# Patient Record
Sex: Female | Born: 1935 | Race: Black or African American | Hispanic: No | Marital: Single | Smoking: Never smoker
Health system: Southern US, Community
[De-identification: ages and names within clinical notes are randomized; demographics above are authoritative.]

## PROBLEM LIST (undated history)

## (undated) DIAGNOSIS — E119 Type 2 diabetes mellitus without complications: Secondary | ICD-10-CM

## (undated) DIAGNOSIS — I10 Essential (primary) hypertension: Secondary | ICD-10-CM

## (undated) DIAGNOSIS — I639 Cerebral infarction, unspecified: Secondary | ICD-10-CM

## (undated) DIAGNOSIS — D649 Anemia, unspecified: Secondary | ICD-10-CM

## (undated) DIAGNOSIS — M1990 Unspecified osteoarthritis, unspecified site: Secondary | ICD-10-CM

## (undated) DIAGNOSIS — N189 Chronic kidney disease, unspecified: Secondary | ICD-10-CM

## (undated) DIAGNOSIS — K649 Unspecified hemorrhoids: Secondary | ICD-10-CM

## (undated) DIAGNOSIS — E079 Disorder of thyroid, unspecified: Secondary | ICD-10-CM

## (undated) HISTORY — DX: Unspecified osteoarthritis, unspecified site: M19.90

## (undated) HISTORY — DX: Type 2 diabetes mellitus without complications: E11.9

## (undated) HISTORY — DX: Chronic kidney disease, unspecified: N18.9

## (undated) HISTORY — PX: BREAST LUMPECTOMY: SHX2

## (undated) HISTORY — DX: Unspecified hemorrhoids: K64.9

## (undated) HISTORY — PX: ABDOMINAL HYSTERECTOMY: SHX81

## (undated) HISTORY — DX: Disorder of thyroid, unspecified: E07.9

## (undated) HISTORY — DX: Anemia, unspecified: D64.9

## (undated) HISTORY — DX: Essential (primary) hypertension: I10

---

## 2001-07-06 ENCOUNTER — Encounter: Admission: RE | Admit: 2001-07-06 | Discharge: 2001-07-06 | Payer: Self-pay | Admitting: Endocrinology

## 2001-07-06 ENCOUNTER — Encounter: Payer: Self-pay | Admitting: Endocrinology

## 2003-05-19 ENCOUNTER — Emergency Department (HOSPITAL_COMMUNITY): Admission: EM | Admit: 2003-05-19 | Discharge: 2003-05-19 | Payer: Self-pay | Admitting: Family Medicine

## 2003-05-26 ENCOUNTER — Emergency Department (HOSPITAL_COMMUNITY): Admission: EM | Admit: 2003-05-26 | Discharge: 2003-05-26 | Payer: Self-pay | Admitting: Family Medicine

## 2005-04-03 ENCOUNTER — Encounter: Admission: RE | Admit: 2005-04-03 | Discharge: 2005-04-03 | Payer: Self-pay | Admitting: Endocrinology

## 2006-04-15 ENCOUNTER — Encounter: Admission: RE | Admit: 2006-04-15 | Discharge: 2006-04-15 | Payer: Self-pay | Admitting: Endocrinology

## 2007-04-18 ENCOUNTER — Encounter: Admission: RE | Admit: 2007-04-18 | Discharge: 2007-04-18 | Payer: Self-pay | Admitting: Endocrinology

## 2008-05-18 ENCOUNTER — Encounter: Admission: RE | Admit: 2008-05-18 | Discharge: 2008-05-18 | Payer: Self-pay | Admitting: Endocrinology

## 2008-05-19 HISTORY — PX: MASTECTOMY: SHX3

## 2008-05-30 ENCOUNTER — Encounter: Admission: RE | Admit: 2008-05-30 | Discharge: 2008-05-30 | Payer: Self-pay | Admitting: Endocrinology

## 2008-05-30 ENCOUNTER — Encounter (INDEPENDENT_AMBULATORY_CARE_PROVIDER_SITE_OTHER): Payer: Self-pay | Admitting: Endocrinology

## 2008-05-30 ENCOUNTER — Encounter (INDEPENDENT_AMBULATORY_CARE_PROVIDER_SITE_OTHER): Payer: Self-pay | Admitting: Diagnostic Radiology

## 2008-06-04 ENCOUNTER — Encounter: Admission: RE | Admit: 2008-06-04 | Discharge: 2008-06-04 | Payer: Self-pay | Admitting: General Surgery

## 2008-06-05 ENCOUNTER — Encounter: Admission: RE | Admit: 2008-06-05 | Discharge: 2008-06-05 | Payer: Self-pay | Admitting: Endocrinology

## 2008-06-06 ENCOUNTER — Encounter (INDEPENDENT_AMBULATORY_CARE_PROVIDER_SITE_OTHER): Payer: Self-pay | Admitting: Diagnostic Radiology

## 2008-06-06 ENCOUNTER — Encounter: Admission: RE | Admit: 2008-06-06 | Discharge: 2008-06-06 | Payer: Self-pay | Admitting: Endocrinology

## 2008-06-06 ENCOUNTER — Encounter: Payer: Self-pay | Admitting: General Surgery

## 2008-06-08 ENCOUNTER — Ambulatory Visit (HOSPITAL_COMMUNITY): Admission: RE | Admit: 2008-06-08 | Discharge: 2008-06-09 | Payer: Self-pay | Admitting: General Surgery

## 2008-06-08 ENCOUNTER — Encounter (INDEPENDENT_AMBULATORY_CARE_PROVIDER_SITE_OTHER): Payer: Self-pay | Admitting: General Surgery

## 2008-06-21 ENCOUNTER — Ambulatory Visit: Payer: Self-pay | Admitting: Oncology

## 2008-06-27 LAB — CBC WITH DIFFERENTIAL/PLATELET
Basophils Absolute: 0 10*3/uL (ref 0.0–0.1)
EOS%: 2.2 % (ref 0.0–7.0)
Eosinophils Absolute: 0.2 10*3/uL (ref 0.0–0.5)
HCT: 29.3 % — ABNORMAL LOW (ref 34.8–46.6)
HGB: 9.5 g/dL — ABNORMAL LOW (ref 11.6–15.9)
MCH: 24.6 pg — ABNORMAL LOW (ref 25.1–34.0)
MCV: 75.8 fL — ABNORMAL LOW (ref 79.5–101.0)
MONO%: 6.9 % (ref 0.0–14.0)
NEUT%: 77.6 % — ABNORMAL HIGH (ref 38.4–76.8)
lymph#: 1 10*3/uL (ref 0.9–3.3)

## 2008-06-27 LAB — RETICULOCYTES
IRF: 0.3 (ref 0.130–0.330)
RBC: 3.81 10*6/uL (ref 3.70–5.45)

## 2008-06-28 LAB — CANCER ANTIGEN 27.29: CA 27.29: 21 U/mL (ref 0–39)

## 2008-06-28 LAB — COMPREHENSIVE METABOLIC PANEL
AST: 11 U/L (ref 0–37)
BUN: 23 mg/dL (ref 6–23)
Calcium: 8.8 mg/dL (ref 8.4–10.5)
Chloride: 102 mEq/L (ref 96–112)
Creatinine, Ser: 1.38 mg/dL — ABNORMAL HIGH (ref 0.40–1.20)
Glucose, Bld: 195 mg/dL — ABNORMAL HIGH (ref 70–99)

## 2008-06-28 LAB — VITAMIN D 25 HYDROXY (VIT D DEFICIENCY, FRACTURES): Vit D, 25-Hydroxy: 7 ng/mL — ABNORMAL LOW (ref 30–89)

## 2008-06-30 LAB — IRON AND TIBC
Iron: 15 ug/dL — ABNORMAL LOW (ref 42–145)
TIBC: 181 ug/dL — ABNORMAL LOW (ref 250–470)
UIBC: 166 ug/dL

## 2008-06-30 LAB — TRANSFERRIN RECEPTOR, SOLUABLE: Transferrin Receptor, Soluble: 26.9 nmol/L

## 2008-07-02 ENCOUNTER — Encounter: Admission: RE | Admit: 2008-07-02 | Discharge: 2008-07-02 | Payer: Self-pay | Admitting: Oncology

## 2008-10-29 ENCOUNTER — Ambulatory Visit: Payer: Self-pay | Admitting: Oncology

## 2008-10-31 LAB — CBC WITH DIFFERENTIAL/PLATELET
Basophils Absolute: 0 10*3/uL (ref 0.0–0.1)
EOS%: 1.5 % (ref 0.0–7.0)
HGB: 10 g/dL — ABNORMAL LOW (ref 11.6–15.9)
MCH: 23.8 pg — ABNORMAL LOW (ref 25.1–34.0)
NEUT#: 4.9 10*3/uL (ref 1.5–6.5)
RDW: 14.2 % (ref 11.2–14.5)
WBC: 6.8 10*3/uL (ref 3.9–10.3)
lymph#: 1.4 10*3/uL (ref 0.9–3.3)

## 2008-11-01 LAB — COMPREHENSIVE METABOLIC PANEL
ALT: 11 U/L (ref 0–35)
AST: 13 U/L (ref 0–37)
Albumin: 3.6 g/dL (ref 3.5–5.2)
BUN: 23 mg/dL (ref 6–23)
Calcium: 9.1 mg/dL (ref 8.4–10.5)
Chloride: 101 mEq/L (ref 96–112)
Potassium: 4.4 mEq/L (ref 3.5–5.3)

## 2009-05-23 ENCOUNTER — Ambulatory Visit: Payer: Self-pay | Admitting: Oncology

## 2009-05-24 LAB — CBC & DIFF AND RETIC
BASO%: 0.3 % (ref 0.0–2.0)
Basophils Absolute: 0 10*3/uL (ref 0.0–0.1)
Eosinophils Absolute: 0.2 10*3/uL (ref 0.0–0.5)
HGB: 9.7 g/dL — ABNORMAL LOW (ref 11.6–15.9)
LYMPH%: 20.7 % (ref 14.0–49.7)
MCH: 23.5 pg — ABNORMAL LOW (ref 25.1–34.0)
NEUT#: 5.6 10*3/uL (ref 1.5–6.5)
NEUT%: 70.9 % (ref 38.4–76.8)
RBC: 4.13 10*6/uL (ref 3.70–5.45)
RDW: 14.4 % (ref 11.2–14.5)
Retic %: 1.18 % (ref 0.50–1.50)
Retic Ct Abs: 48.73 10*3/uL (ref 18.30–72.70)
lymph#: 1.6 10*3/uL (ref 0.9–3.3)

## 2009-05-27 LAB — COMPREHENSIVE METABOLIC PANEL
ALT: 10 U/L (ref 0–35)
Alkaline Phosphatase: 81 U/L (ref 39–117)
BUN: 27 mg/dL — ABNORMAL HIGH (ref 6–23)
Calcium: 9 mg/dL (ref 8.4–10.5)
Creatinine, Ser: 1.41 mg/dL — ABNORMAL HIGH (ref 0.40–1.20)
Glucose, Bld: 133 mg/dL — ABNORMAL HIGH (ref 70–99)
Sodium: 139 mEq/L (ref 135–145)
Total Protein: 6.8 g/dL (ref 6.0–8.3)

## 2009-05-27 LAB — IRON AND TIBC
%SAT: 15 % — ABNORMAL LOW (ref 20–55)
TIBC: 199 ug/dL — ABNORMAL LOW (ref 250–470)
UIBC: 169 ug/dL

## 2009-06-14 ENCOUNTER — Encounter: Admission: RE | Admit: 2009-06-14 | Discharge: 2009-06-14 | Payer: Self-pay | Admitting: Oncology

## 2009-06-24 ENCOUNTER — Ambulatory Visit: Payer: Self-pay | Admitting: Oncology

## 2009-06-26 LAB — CBC WITH DIFFERENTIAL/PLATELET
Basophils Absolute: 0 10*3/uL (ref 0.0–0.1)
HCT: 32.4 % — ABNORMAL LOW (ref 34.8–46.6)
LYMPH%: 22.2 % (ref 14.0–49.7)
MCHC: 31.5 g/dL (ref 31.5–36.0)
MONO%: 8 % (ref 0.0–14.0)
NEUT%: 67.8 % (ref 38.4–76.8)
Platelets: 249 10*3/uL (ref 145–400)
RDW: 14.5 % (ref 11.2–14.5)
lymph#: 1.9 10*3/uL (ref 0.9–3.3)

## 2009-07-01 LAB — CBC WITH DIFFERENTIAL/PLATELET
HGB: 10.3 g/dL — ABNORMAL LOW (ref 11.6–15.9)
MCHC: 32.4 g/dL (ref 31.5–36.0)
MCV: 75.8 fL — ABNORMAL LOW (ref 79.5–101.0)
MONO%: 6.3 % (ref 0.0–14.0)
NEUT#: 6.7 10*3/uL — ABNORMAL HIGH (ref 1.5–6.5)
Platelets: 275 10*3/uL (ref 145–400)
RDW: 14.9 % — ABNORMAL HIGH (ref 11.2–14.5)

## 2009-07-24 ENCOUNTER — Ambulatory Visit: Payer: Self-pay | Admitting: Oncology

## 2009-07-26 LAB — CBC WITH DIFFERENTIAL/PLATELET
EOS%: 1.5 % (ref 0.0–7.0)
Eosinophils Absolute: 0.1 10*3/uL (ref 0.0–0.5)
MCH: 23.7 pg — ABNORMAL LOW (ref 25.1–34.0)
MONO#: 0.6 10*3/uL (ref 0.1–0.9)
MONO%: 7.6 % (ref 0.0–14.0)
NEUT#: 5.3 10*3/uL (ref 1.5–6.5)
RBC: 4.23 10*6/uL (ref 3.70–5.45)
RDW: 16 % — ABNORMAL HIGH (ref 11.2–14.5)
WBC: 7.6 10*3/uL (ref 3.9–10.3)

## 2009-08-23 ENCOUNTER — Ambulatory Visit: Payer: Self-pay | Admitting: Oncology

## 2009-08-27 LAB — CBC WITH DIFFERENTIAL/PLATELET
Basophils Absolute: 0 10*3/uL (ref 0.0–0.1)
EOS%: 1.4 % (ref 0.0–7.0)
MONO#: 0.5 10*3/uL (ref 0.1–0.9)
MONO%: 6.5 % (ref 0.0–14.0)
NEUT%: 72.5 % (ref 38.4–76.8)
RBC: 4.48 10*6/uL (ref 3.70–5.45)
RDW: 17.1 % — ABNORMAL HIGH (ref 11.2–14.5)

## 2009-09-20 LAB — CBC WITH DIFFERENTIAL/PLATELET
BASO%: 0.2 % (ref 0.0–2.0)
Basophils Absolute: 0 10*3/uL (ref 0.0–0.1)
MCHC: 31.5 g/dL (ref 31.5–36.0)
MONO#: 0.9 10*3/uL (ref 0.1–0.9)
MONO%: 7 % (ref 0.0–14.0)
NEUT#: 10.9 10*3/uL — ABNORMAL HIGH (ref 1.5–6.5)
NEUT%: 81.4 % — ABNORMAL HIGH (ref 38.4–76.8)
RBC: 4.85 10*6/uL (ref 3.70–5.45)
RDW: 17.2 % — ABNORMAL HIGH (ref 11.2–14.5)
WBC: 13.4 10*3/uL — ABNORMAL HIGH (ref 3.9–10.3)

## 2009-10-16 ENCOUNTER — Ambulatory Visit: Payer: Self-pay | Admitting: Oncology

## 2009-10-18 LAB — CBC WITH DIFFERENTIAL/PLATELET
Eosinophils Absolute: 0.2 10*3/uL (ref 0.0–0.5)
LYMPH%: 21.5 % (ref 14.0–49.7)
MONO#: 0.7 10*3/uL (ref 0.1–0.9)
MONO%: 9.5 % (ref 0.0–14.0)
NEUT#: 5.1 10*3/uL (ref 1.5–6.5)
Platelets: 225 10*3/uL (ref 145–400)
WBC: 7.7 10*3/uL (ref 3.9–10.3)

## 2009-10-22 LAB — CBC & DIFF AND RETIC
Basophils Absolute: 0 10*3/uL (ref 0.0–0.1)
HCT: 31.4 % — ABNORMAL LOW (ref 34.8–46.6)
HGB: 9.9 g/dL — ABNORMAL LOW (ref 11.6–15.9)
LYMPH%: 25.8 % (ref 14.0–49.7)
MCH: 23.4 pg — ABNORMAL LOW (ref 25.1–34.0)
MCHC: 31.5 g/dL (ref 31.5–36.0)
MCV: 74.2 fL — ABNORMAL LOW (ref 79.5–101.0)
MONO%: 7.5 % (ref 0.0–14.0)
NEUT%: 64.7 % (ref 38.4–76.8)
RBC: 4.23 10*6/uL (ref 3.70–5.45)
RDW: 16.5 % — ABNORMAL HIGH (ref 11.2–14.5)
Retic %: 1.35 % (ref 0.50–1.50)
lymph#: 2.1 10*3/uL (ref 0.9–3.3)

## 2009-10-24 LAB — HEMOGLOBINOPATHY EVALUATION
Hgb A2 Quant: 2.2 % (ref 2.2–3.2)
Hgb A: 97.8 % (ref 96.8–97.8)

## 2009-10-24 LAB — COMPREHENSIVE METABOLIC PANEL
ALT: 13 U/L (ref 0–35)
AST: 17 U/L (ref 0–37)
Alkaline Phosphatase: 80 U/L (ref 39–117)
BUN: 25 mg/dL — ABNORMAL HIGH (ref 6–23)
Glucose, Bld: 128 mg/dL — ABNORMAL HIGH (ref 70–99)
Potassium: 4.1 mEq/L (ref 3.5–5.3)
Sodium: 137 mEq/L (ref 135–145)
Total Protein: 6.1 g/dL (ref 6.0–8.3)

## 2009-11-13 ENCOUNTER — Ambulatory Visit: Payer: Self-pay | Admitting: Oncology

## 2009-11-15 LAB — CBC WITH DIFFERENTIAL/PLATELET
BASO%: 0.5 % (ref 0.0–2.0)
Eosinophils Absolute: 0.2 10*3/uL (ref 0.0–0.5)
HCT: 33.5 % — ABNORMAL LOW (ref 34.8–46.6)
HGB: 10.4 g/dL — ABNORMAL LOW (ref 11.6–15.9)
LYMPH%: 23.9 % (ref 14.0–49.7)
MCV: 76.6 fL — ABNORMAL LOW (ref 79.5–101.0)
NEUT#: 5.1 10*3/uL (ref 1.5–6.5)
Platelets: 269 10*3/uL (ref 145–400)
RBC: 4.38 10*6/uL (ref 3.70–5.45)
RDW: 16.5 % — ABNORMAL HIGH (ref 11.2–14.5)
WBC: 7.9 10*3/uL (ref 3.9–10.3)

## 2009-12-10 ENCOUNTER — Ambulatory Visit: Payer: Self-pay | Admitting: Oncology

## 2009-12-11 LAB — CBC WITH DIFFERENTIAL/PLATELET
BASO%: 0.2 % (ref 0.0–2.0)
EOS%: 2.4 % (ref 0.0–7.0)
Eosinophils Absolute: 0.2 10*3/uL (ref 0.0–0.5)
LYMPH%: 22.9 % (ref 14.0–49.7)
MCH: 24.4 pg — ABNORMAL LOW (ref 25.1–34.0)
MCHC: 32.2 g/dL (ref 31.5–36.0)
MCV: 76 fL — ABNORMAL LOW (ref 79.5–101.0)
MONO%: 6.2 % (ref 0.0–14.0)
Platelets: 273 10*3/uL (ref 145–400)
RBC: 4.5 10*6/uL (ref 3.70–5.45)

## 2010-01-09 ENCOUNTER — Ambulatory Visit: Payer: Self-pay | Admitting: Oncology

## 2010-01-10 LAB — CBC WITH DIFFERENTIAL/PLATELET
BASO%: 0.4 % (ref 0.0–2.0)
Eosinophils Absolute: 0.2 10*3/uL (ref 0.0–0.5)
HCT: 33 % — ABNORMAL LOW (ref 34.8–46.6)
HGB: 10.5 g/dL — ABNORMAL LOW (ref 11.6–15.9)
MCH: 24 pg — ABNORMAL LOW (ref 25.1–34.0)
MCHC: 31.9 g/dL (ref 31.5–36.0)
MCV: 75.3 fL — ABNORMAL LOW (ref 79.5–101.0)
Platelets: 286 10*3/uL (ref 145–400)
RBC: 4.38 10*6/uL (ref 3.70–5.45)
RDW: 16.1 % — ABNORMAL HIGH (ref 11.2–14.5)

## 2010-02-07 LAB — CBC WITH DIFFERENTIAL/PLATELET
BASO%: 0.5 % (ref 0.0–2.0)
Basophils Absolute: 0 10*3/uL (ref 0.0–0.1)
EOS%: 1.4 % (ref 0.0–7.0)
Eosinophils Absolute: 0.1 10*3/uL (ref 0.0–0.5)
HCT: 34.2 % — ABNORMAL LOW (ref 34.8–46.6)
HGB: 10.7 g/dL — ABNORMAL LOW (ref 11.6–15.9)
LYMPH%: 21.1 % (ref 14.0–49.7)
MCH: 23.8 pg — ABNORMAL LOW (ref 25.1–34.0)
MCHC: 31.3 g/dL — ABNORMAL LOW (ref 31.5–36.0)
MCV: 76 fL — ABNORMAL LOW (ref 79.5–101.0)
MONO#: 0.7 10*3/uL (ref 0.1–0.9)
MONO%: 11.8 % (ref 0.0–14.0)
NEUT#: 3.9 10*3/uL (ref 1.5–6.5)
NEUT%: 65.2 % (ref 38.4–76.8)
Platelets: 230 10*3/uL (ref 145–400)
RBC: 4.5 10*6/uL (ref 3.70–5.45)
RDW: 16.7 % — ABNORMAL HIGH (ref 11.2–14.5)
WBC: 6 10*3/uL (ref 3.9–10.3)
lymph#: 1.3 10*3/uL (ref 0.9–3.3)

## 2010-02-08 ENCOUNTER — Other Ambulatory Visit: Payer: Self-pay | Admitting: Oncology

## 2010-02-08 DIAGNOSIS — Z853 Personal history of malignant neoplasm of breast: Secondary | ICD-10-CM

## 2010-02-08 DIAGNOSIS — Z780 Asymptomatic menopausal state: Secondary | ICD-10-CM

## 2010-02-08 DIAGNOSIS — Z1239 Encounter for other screening for malignant neoplasm of breast: Secondary | ICD-10-CM

## 2010-02-10 ENCOUNTER — Encounter: Payer: Self-pay | Admitting: General Surgery

## 2010-03-07 ENCOUNTER — Encounter (HOSPITAL_BASED_OUTPATIENT_CLINIC_OR_DEPARTMENT_OTHER): Payer: Medicare Other | Admitting: Oncology

## 2010-03-07 ENCOUNTER — Other Ambulatory Visit: Payer: Self-pay | Admitting: Oncology

## 2010-03-07 DIAGNOSIS — C50419 Malignant neoplasm of upper-outer quadrant of unspecified female breast: Secondary | ICD-10-CM

## 2010-03-07 LAB — CBC WITH DIFFERENTIAL/PLATELET
Basophils Absolute: 0 10*3/uL (ref 0.0–0.1)
EOS%: 2.7 % (ref 0.0–7.0)
Eosinophils Absolute: 0.2 10*3/uL (ref 0.0–0.5)
LYMPH%: 26.2 % (ref 14.0–49.7)
MCHC: 31.5 g/dL (ref 31.5–36.0)
MCV: 75.5 fL — ABNORMAL LOW (ref 79.5–101.0)
MONO#: 0.7 10*3/uL (ref 0.1–0.9)
MONO%: 8.6 % (ref 0.0–14.0)
RBC: 4.57 10*6/uL (ref 3.70–5.45)
lymph#: 2 10*3/uL (ref 0.9–3.3)

## 2010-03-19 ENCOUNTER — Other Ambulatory Visit: Payer: Self-pay | Admitting: Family Medicine

## 2010-03-19 DIAGNOSIS — R413 Other amnesia: Secondary | ICD-10-CM

## 2010-03-19 DIAGNOSIS — H547 Unspecified visual loss: Secondary | ICD-10-CM

## 2010-03-20 ENCOUNTER — Ambulatory Visit
Admission: RE | Admit: 2010-03-20 | Discharge: 2010-03-20 | Disposition: A | Discharge status: Home / Self Care | Payer: Medicare Other | Source: Ambulatory Visit | Attending: Family Medicine | Admitting: Family Medicine

## 2010-03-20 DIAGNOSIS — R413 Other amnesia: Secondary | ICD-10-CM

## 2010-03-20 DIAGNOSIS — H547 Unspecified visual loss: Secondary | ICD-10-CM

## 2010-03-20 MED ORDER — IOHEXOL 300 MG/ML  SOLN
50.0000 mL | Freq: Once | INTRAMUSCULAR | Status: AC | PRN
  Administered 2010-03-20: 50 mL via INTRAVENOUS

## 2010-04-04 ENCOUNTER — Other Ambulatory Visit: Payer: Self-pay | Admitting: Oncology

## 2010-04-04 ENCOUNTER — Encounter (HOSPITAL_BASED_OUTPATIENT_CLINIC_OR_DEPARTMENT_OTHER): Payer: Medicare Other | Admitting: Oncology

## 2010-04-04 DIAGNOSIS — C50419 Malignant neoplasm of upper-outer quadrant of unspecified female breast: Secondary | ICD-10-CM

## 2010-04-04 LAB — CBC WITH DIFFERENTIAL/PLATELET
BASO%: 0.2 % (ref 0.0–2.0)
LYMPH%: 14.8 % (ref 14.0–49.7)
MCHC: 31.7 g/dL (ref 31.5–36.0)
MONO#: 0.5 10*3/uL (ref 0.1–0.9)
Platelets: ADEQUATE 10*3/uL (ref 145–400)
RBC: 4.03 10*6/uL (ref 3.70–5.45)
RDW: 16.1 % — ABNORMAL HIGH (ref 11.2–14.5)
WBC: 9.4 10*3/uL (ref 3.9–10.3)

## 2010-04-10 ENCOUNTER — Other Ambulatory Visit (HOSPITAL_COMMUNITY): Payer: Medicare Other

## 2010-04-23 ENCOUNTER — Other Ambulatory Visit: Payer: Self-pay | Admitting: Oncology

## 2010-04-23 DIAGNOSIS — D649 Anemia, unspecified: Secondary | ICD-10-CM

## 2010-04-23 DIAGNOSIS — Z170 Estrogen receptor positive status [ER+]: Secondary | ICD-10-CM

## 2010-04-23 DIAGNOSIS — N289 Disorder of kidney and ureter, unspecified: Secondary | ICD-10-CM

## 2010-04-23 LAB — CBC & DIFF AND RETIC
BASO%: 0.4 % (ref 0.0–2.0)
Basophils Absolute: 0 10*3/uL (ref 0.0–0.1)
EOS%: 0.5 % (ref 0.0–7.0)
HGB: 10.3 g/dL — ABNORMAL LOW (ref 11.6–15.9)
Immature Retic Fract: 3.1 % (ref 0.00–10.70)
MCH: 23.3 pg — ABNORMAL LOW (ref 25.1–34.0)
MCHC: 31.6 g/dL (ref 31.5–36.0)
RDW: 15.7 % — ABNORMAL HIGH (ref 11.2–14.5)
Retic Ct Abs: 54.05 10*3/uL (ref 18.30–72.70)
lymph#: 1.4 10*3/uL (ref 0.9–3.3)

## 2010-04-23 LAB — CHCC SMEAR

## 2010-04-24 LAB — COMPREHENSIVE METABOLIC PANEL
ALT: 9 U/L (ref 0–35)
Alkaline Phosphatase: 80 U/L (ref 39–117)
Creatinine, Ser: 1.27 mg/dL — ABNORMAL HIGH (ref 0.40–1.20)
Sodium: 138 mEq/L (ref 135–145)
Total Bilirubin: 0.3 mg/dL (ref 0.3–1.2)
Total Protein: 6.1 g/dL (ref 6.0–8.3)

## 2010-04-24 LAB — FERRITIN: Ferritin: 307 ng/mL — ABNORMAL HIGH (ref 10–291)

## 2010-04-24 LAB — IRON AND TIBC
%SAT: 31 % (ref 20–55)
TIBC: 202 ug/dL — ABNORMAL LOW (ref 250–470)

## 2010-04-29 LAB — COMPREHENSIVE METABOLIC PANEL
ALT: 12 U/L (ref 0–35)
AST: 12 U/L (ref 0–37)
Albumin: 3 g/dL — ABNORMAL LOW (ref 3.5–5.2)
CO2: 32 mEq/L (ref 19–32)
Chloride: 103 mEq/L (ref 96–112)
GFR calc Af Amer: 49 mL/min — ABNORMAL LOW (ref >60)
GFR calc non Af Amer: 41 mL/min — ABNORMAL LOW (ref >60)
Sodium: 140 mEq/L (ref 135–145)
Total Bilirubin: 0.5 mg/dL (ref 0.3–1.2)

## 2010-04-29 LAB — URINE MICROSCOPIC-ADD ON

## 2010-04-29 LAB — CBC
Platelets: 216 10*3/uL (ref 150–400)
RBC: 4.36 MIL/uL (ref 3.87–5.11)
WBC: 6 10*3/uL (ref 4.0–10.5)

## 2010-04-29 LAB — DIFFERENTIAL
Basophils Absolute: 0 10*3/uL (ref 0.0–0.1)
Eosinophils Absolute: 0.2 10*3/uL (ref 0.0–0.7)
Eosinophils Relative: 3 % (ref 0–5)
Lymphocytes Relative: 26 % (ref 12–46)
Lymphs Abs: 1.5 10*3/uL (ref 0.7–4.0)
Monocytes Absolute: 0.6 10*3/uL (ref 0.1–1.0)

## 2010-04-29 LAB — GLUCOSE, CAPILLARY
Glucose-Capillary: 159 mg/dL — ABNORMAL HIGH (ref 70–99)
Glucose-Capillary: 177 mg/dL — ABNORMAL HIGH (ref 70–99)
Glucose-Capillary: 288 mg/dL — ABNORMAL HIGH (ref 70–99)
Glucose-Capillary: 300 mg/dL — ABNORMAL HIGH (ref 70–99)
Glucose-Capillary: 341 mg/dL — ABNORMAL HIGH (ref 70–99)
Glucose-Capillary: 343 mg/dL — ABNORMAL HIGH (ref 70–99)

## 2010-04-29 LAB — URINALYSIS, ROUTINE W REFLEX MICROSCOPIC
Bilirubin Urine: NEGATIVE
Glucose, UA: NEGATIVE mg/dL
Hgb urine dipstick: NEGATIVE
Specific Gravity, Urine: 1.019 (ref 1.005–1.030)
Urobilinogen, UA: 1 mg/dL (ref 0.0–1.0)

## 2010-04-29 LAB — LACTATE DEHYDROGENASE: LDH: 151 U/L (ref 94–250)

## 2010-04-29 LAB — CANCER ANTIGEN 27.29: CA 27.29: 20 U/mL (ref 0–39)

## 2010-04-30 ENCOUNTER — Encounter (HOSPITAL_BASED_OUTPATIENT_CLINIC_OR_DEPARTMENT_OTHER): Payer: Medicare Other | Admitting: Oncology

## 2010-04-30 DIAGNOSIS — C50419 Malignant neoplasm of upper-outer quadrant of unspecified female breast: Secondary | ICD-10-CM

## 2010-04-30 DIAGNOSIS — D649 Anemia, unspecified: Secondary | ICD-10-CM

## 2010-04-30 DIAGNOSIS — N289 Disorder of kidney and ureter, unspecified: Secondary | ICD-10-CM

## 2010-04-30 DIAGNOSIS — Z170 Estrogen receptor positive status [ER+]: Secondary | ICD-10-CM

## 2010-05-29 ENCOUNTER — Other Ambulatory Visit: Payer: Self-pay | Admitting: Oncology

## 2010-05-29 ENCOUNTER — Encounter (HOSPITAL_BASED_OUTPATIENT_CLINIC_OR_DEPARTMENT_OTHER): Payer: Medicare Other | Admitting: Oncology

## 2010-05-29 DIAGNOSIS — D649 Anemia, unspecified: Secondary | ICD-10-CM

## 2010-05-29 DIAGNOSIS — C50419 Malignant neoplasm of upper-outer quadrant of unspecified female breast: Secondary | ICD-10-CM

## 2010-05-29 DIAGNOSIS — N289 Disorder of kidney and ureter, unspecified: Secondary | ICD-10-CM

## 2010-05-29 LAB — CBC WITH DIFFERENTIAL/PLATELET
Basophils Absolute: 0 10*3/uL (ref 0.0–0.1)
Eosinophils Absolute: 0.1 10*3/uL (ref 0.0–0.5)
HCT: 33.1 % — ABNORMAL LOW (ref 34.8–46.6)
HGB: 10.6 g/dL — ABNORMAL LOW (ref 11.6–15.9)
LYMPH%: 18.9 % (ref 14.0–49.7)
MCV: 76.6 fL — ABNORMAL LOW (ref 79.5–101.0)
MONO#: 0.5 10*3/uL (ref 0.1–0.9)
MONO%: 7.5 % (ref 0.0–14.0)
NEUT#: 5.2 10*3/uL (ref 1.5–6.5)
Platelets: 233 10*3/uL (ref 145–400)
RBC: 4.33 10*6/uL (ref 3.70–5.45)
WBC: 7.3 10*3/uL (ref 3.9–10.3)

## 2010-06-03 NOTE — Op Note (Signed)
[name], [name]             ACCOUNT NO.:  [id number]   MEDICAL RECORD NO.:  [id number]          PATIENT TYPE:  OIB   LOCATION:  5124                         FACILITY:  [facility]   PHYSICIAN:  Angelia Mould. Derrell Lolling, M.D.DATE OF BIRTH:  04-07-1930   DATE OF PROCEDURE:  06/08/2008  DATE OF DISCHARGE:                               OPERATIVE REPORT   PREOPERATIVE DIAGNOSIS:  Multicentric invasive ductal carcinoma, left  breast.   POSTOPERATIVE DIAGNOSIS:  Multicentric invasive ductal carcinoma, left  breast.   OPERATIONS PERFORMED:  1. Inject blue dye, left breast.  2. Left total mastectomy.  3. Left axillary sentinel node biopsy.   SURGEON:  Angelia Mould. Derrell Lolling, MD   OPERATIVE INDICATIONS:  This is a 77 year old African American female  who gets annual mammograms, and recent mammograms and ultrasound showed  a 1.8-cm density in the left breast at the 1 o'clock position 9 cm from  the nipple.  The left axilla looked normal on the ultrasound.  This area  was biopsied and showed invasive ductal carcinoma.  Subsequent MRI also  showed a small 7-mm enhancing mass at the 6 o'clock position.  That was  also biopsied by ultrasound guidance and also showed invasive ductal  carcinoma according to Dr. Colonel Bald.  The patient was counseled and decision  was made to proceed with left mastectomy.  Her tumor is felt to be T1c  N0 clinically preop.   OPERATIVE TECHNIQUE:  The patient was brought to the holding area at  [facility].  The left breast was injected with radionuclide by the  nuclear medicine technician.  The patient was taken to the operating  room where general anesthesia was induced.  The patient was identified  as the correct patient, correct procedure, and correct site.  Following  alcohol prep, I injected 5 mL of blue dye in the left retroareolar area.  This was 2 mL of methylene blue mixed with 3 mL of saline.  The breast  was massaged for 5 minutes.  Intravenous antibiotics were  given.  The  patient's neck and chest and arm and posterior chest wall were then  prepped and draped in a sterile fashion.   Using a marking pen, I marked the suspected location of both of the  tumors.  I could actually palpate small hematoma at the 1 o'clock  position.  Transverse elliptical incision was marked to include the skin  overlying the palpable mass superiorly.  The inframammary crease midline  and borders of the breast gland were marked.  Transverse elliptical  incision was made, which encompassed the nipple and areolar complex and  the superiorly located mass.  Skin flaps were raised superiorly to below  the clavicle, medially to the paramedian parasternal area, inferiorly to  the anterior rectus sheath and laterally to just anterior to the  latissimus dorsi muscle.   Using the Neoprobe, I dissected out 2 very hot and somewhat blue  sentinel nodes in the left axilla.  These were sent to the lab.  Dr.  Jimmy Picket performed imprint cytology.  He said one was negative and  one specimen had rare  atypical cells, not diagnostic for cancer.  Neither Dr. Luisa Hart nor I felt that there was any indication for  axillary dissection given their findings.   The breast was then dissected off of the pectoralis major and minor  muscles with electrocautery.  The axillary tail of [name] was dissected  away with the breast and the specimen was removed.  I marked the  axillary tail of the breast with a silk suture to orient the  pathologist.  The specimen was sent off the field.  Hemostasis was  excellent and achieved with electrocautery and some metal clips.  The  wound was irrigated with about 3000 mL of saline.  The wound was very  clean and dry.  Two 19-French Blake drains were placed, one up into the  axilla and one across the skin flaps.  These were brought out through  inferolaterally-placed stab wounds, sutured to the skin with nylon  sutures and connected to the suction bulbs.  The  skin was closed with  skin staples.  The wound was covered with Adaptic gauze, 4 x 4s, ABDs,  and 6-inch Coban.  The patient tolerated the procedure well, was taken  to recovery room in a stable condition.  Estimated blood loss was about  150 mL or less.  Complications were none.  Sponge, needle, and  instrument counts were correct.      Angelia Mould. Derrell Lolling, M.D.  Electronically Signed     HMI/MEDQ  D:  06/08/2008  T:  06/09/2008  Job:  [id number]   cc:   Alfonse Alpers. Dagoberto Ligas, M.D.  Katherine Roan, M.D.

## 2010-06-10 ENCOUNTER — Encounter (INDEPENDENT_AMBULATORY_CARE_PROVIDER_SITE_OTHER): Payer: Self-pay | Admitting: General Surgery

## 2010-06-27 ENCOUNTER — Other Ambulatory Visit: Payer: Self-pay | Admitting: Oncology

## 2010-06-27 ENCOUNTER — Encounter (HOSPITAL_BASED_OUTPATIENT_CLINIC_OR_DEPARTMENT_OTHER): Payer: Medicare Other | Admitting: Oncology

## 2010-06-27 DIAGNOSIS — D649 Anemia, unspecified: Secondary | ICD-10-CM

## 2010-06-27 DIAGNOSIS — N289 Disorder of kidney and ureter, unspecified: Secondary | ICD-10-CM

## 2010-06-27 DIAGNOSIS — C50419 Malignant neoplasm of upper-outer quadrant of unspecified female breast: Secondary | ICD-10-CM

## 2010-06-27 LAB — CBC WITH DIFFERENTIAL/PLATELET
BASO%: 0.3 % (ref 0.0–2.0)
Eosinophils Absolute: 0.1 10*3/uL (ref 0.0–0.5)
HCT: 32.1 % — ABNORMAL LOW (ref 34.8–46.6)
LYMPH%: 13.7 % — ABNORMAL LOW (ref 14.0–49.7)
MCHC: 31.7 g/dL (ref 31.5–36.0)
MCV: 76.8 fL — ABNORMAL LOW (ref 79.5–101.0)
MONO#: 0.5 10*3/uL (ref 0.1–0.9)
MONO%: 6.3 % (ref 0.0–14.0)
NEUT%: 78.1 % — ABNORMAL HIGH (ref 38.4–76.8)
Platelets: 248 10*3/uL (ref 145–400)
WBC: 8.6 10*3/uL (ref 3.9–10.3)

## 2010-07-11 ENCOUNTER — Ambulatory Visit
Admission: RE | Admit: 2010-07-11 | Discharge: 2010-07-11 | Disposition: A | Discharge status: Home / Self Care | Payer: Medicare Other | Source: Ambulatory Visit | Attending: Oncology | Admitting: Oncology

## 2010-07-11 DIAGNOSIS — Z1239 Encounter for other screening for malignant neoplasm of breast: Secondary | ICD-10-CM

## 2010-07-11 DIAGNOSIS — Z853 Personal history of malignant neoplasm of breast: Secondary | ICD-10-CM

## 2010-07-11 DIAGNOSIS — Z780 Asymptomatic menopausal state: Secondary | ICD-10-CM

## 2010-07-25 ENCOUNTER — Other Ambulatory Visit: Payer: Self-pay | Admitting: Oncology

## 2010-07-25 ENCOUNTER — Encounter (HOSPITAL_BASED_OUTPATIENT_CLINIC_OR_DEPARTMENT_OTHER): Payer: Medicare Other | Admitting: Oncology

## 2010-07-25 DIAGNOSIS — C50419 Malignant neoplasm of upper-outer quadrant of unspecified female breast: Secondary | ICD-10-CM

## 2010-07-25 DIAGNOSIS — D638 Anemia in other chronic diseases classified elsewhere: Secondary | ICD-10-CM

## 2010-07-25 DIAGNOSIS — N289 Disorder of kidney and ureter, unspecified: Secondary | ICD-10-CM

## 2010-07-25 LAB — CBC WITH DIFFERENTIAL/PLATELET
BASO%: 0.2 % (ref 0.0–2.0)
EOS%: 1.7 % (ref 0.0–7.0)
HCT: 31.4 % — ABNORMAL LOW (ref 34.8–46.6)
LYMPH%: 14.4 % (ref 14.0–49.7)
MCH: 24.4 pg — ABNORMAL LOW (ref 25.1–34.0)
MCHC: 32 g/dL (ref 31.5–36.0)
NEUT%: 77.6 % — ABNORMAL HIGH (ref 38.4–76.8)
Platelets: 246 10*3/uL (ref 145–400)
RBC: 4.12 10*6/uL (ref 3.70–5.45)

## 2010-08-04 ENCOUNTER — Encounter (INDEPENDENT_AMBULATORY_CARE_PROVIDER_SITE_OTHER): Payer: Medicare Other | Admitting: General Surgery

## 2010-08-12 ENCOUNTER — Ambulatory Visit (INDEPENDENT_AMBULATORY_CARE_PROVIDER_SITE_OTHER): Payer: Medicare Other | Admitting: General Surgery

## 2010-08-12 ENCOUNTER — Encounter (INDEPENDENT_AMBULATORY_CARE_PROVIDER_SITE_OTHER): Payer: Self-pay | Admitting: General Surgery

## 2010-08-12 DIAGNOSIS — C50912 Malignant neoplasm of unspecified site of left female breast: Secondary | ICD-10-CM

## 2010-08-12 DIAGNOSIS — C50919 Malignant neoplasm of unspecified site of unspecified female breast: Secondary | ICD-10-CM

## 2010-08-12 NOTE — Patient Instructions (Signed)
Your mammograms look good. On physical exam there is no evidence of recurrent cancer on either side. I think that you are cancer free. Continue the Femara that Dr. Donnie Coffin has prescribed. Keep your appointments with Dr. Donnie Coffin and your primary care physician. I will see you in one year after you get her annual mammogram.

## 2010-08-12 NOTE — Progress Notes (Signed)
Subjective:     Patient ID: [name], female   DOB: Jun 03, 1930, 78 y.o.   MRN: [id number]  HPI This patient underwent left total mastectomy and sentinel node biopsy on Jun 08, 2008. She had multifocal invasive cancer of the left breast, pathologic stage TI C., N1, receptor positive, HER-2-negative, Ki-67 37%. She has no known recurrence to date.  She follows with Dr. Pierce Crane. She is taking Aranesp periodically because of anemia. She is taking Femara.  She had right mammogram on July 11, 2010. It was BI-RADS category I. No evidence of malignancy.  She has no complaints about her breast.  Past Medical History  Diagnosis Date  . Hemorrhoid   . Anemia   . Arthritis     Current outpatient prescriptions:B Complex-C (B-COMPLEX WITH VITAMIN C) tablet, Take 1 tablet by mouth daily.  , Disp: , Rfl: ;  Bioflavonoid Products (PERIDIN-C PO), Take by mouth.  , Disp: , Rfl: ;  diazepam (VALIUM) 5 MG tablet, Take 5 mg by mouth every 6 (six) hours as needed.  , Disp: , Rfl: ;  doxazosin (CARDURA) 2 MG tablet, Take 2 mg by mouth at bedtime.  , Disp: , Rfl:  Hydrocodone-APAP-Dietary Prod (HYDROCODONE-APAP-NUTRIT SUPP) 10-650 MG MISC, Take by mouth.  , Disp: , Rfl: ;  indapamide (LOZOL) 1.25 MG tablet, Take 1.25 mg by mouth every morning.  , Disp: , Rfl: ;  INSULIN LISPRO, HUMAN, , Inject into the skin. 70/30 , Disp: , Rfl: ;  INSULIN SYRINGE 1CC/29G 29G X 1/2" 1 ML MISC, by Does not apply route.  , Disp: , Rfl: ;  IRON PO, Take 65 mg by mouth daily.  , Disp: , Rfl:  letrozole (FEMARA) 2.5 MG tablet, Take 2.5 mg by mouth daily.  , Disp: , Rfl: ;  levothyroxine (SYNTHROID, LEVOTHROID) 150 MCG tablet, Take 150 mcg by mouth daily.  , Disp: , Rfl: ;  metoprolol tartrate (LOPRESSOR) 25 MG tablet, Take 25 mg by mouth 2 (two) times daily.  , Disp: , Rfl: ;  olmesartan (BENICAR) 40 MG tablet, Take 40 mg by mouth daily.  , Disp: , Rfl:  potassium chloride (KLOR-CON) 20 MEQ packet, Take 20 mEq by mouth 2 (two)  times daily.  , Disp: , Rfl:   Allergies  Allergen Reactions  . Aspirin   . Insulins     NOVA?  Marland Kitchen Penicillins    No family history on file.    Review of Systems     Objective:   Physical Exam Patient looks well. She is in a wheelchair. She is alert. She continues with her tremor.  Neck no adenopathy or mass. No jugular venous distention.  Left mastectomy wound is well-healed. The tissues are soft there's no nodules or ulceration. The left axilla feels normal.  Right breast is somewhat atrophic soft and pendulous. The skin looks good. Nipple and areola  complex looks good. There's no palpable masses no axillary adenopathy.  Abdomen somewhat obese soft and nontender. Liver and spleen not obviously enlarged.    Assessment:     Multifocal invasive carcinoma left breast, pathologic stage TI C., N1, receptor positive, HER-2/neu negative. No evidence of recurrence 2 years postop left total mastectomy and sentinel biopsy.   Plan:     At her request, I will see her on an annual basis. She will get up mammograms of the right breast in one year and I'll see her after that is done.  She is encouraged to keep regular medical  appointments with her primary care physician.

## 2010-08-22 ENCOUNTER — Other Ambulatory Visit: Payer: Self-pay | Admitting: Oncology

## 2010-08-22 ENCOUNTER — Encounter (HOSPITAL_BASED_OUTPATIENT_CLINIC_OR_DEPARTMENT_OTHER): Payer: Medicare Other | Admitting: Oncology

## 2010-08-22 DIAGNOSIS — N289 Disorder of kidney and ureter, unspecified: Secondary | ICD-10-CM

## 2010-08-22 DIAGNOSIS — C50419 Malignant neoplasm of upper-outer quadrant of unspecified female breast: Secondary | ICD-10-CM

## 2010-08-22 DIAGNOSIS — D649 Anemia, unspecified: Secondary | ICD-10-CM

## 2010-08-22 LAB — CBC WITH DIFFERENTIAL/PLATELET
BASO%: 0.3 % (ref 0.0–2.0)
EOS%: 2.2 % (ref 0.0–7.0)
MCH: 24.3 pg — ABNORMAL LOW (ref 25.1–34.0)
MCHC: 31.9 g/dL (ref 31.5–36.0)
NEUT%: 77 % — ABNORMAL HIGH (ref 38.4–76.8)
RBC: 4.21 10*6/uL (ref 3.70–5.45)
RDW: 15.9 % — ABNORMAL HIGH (ref 11.2–14.5)
lymph#: 1.2 10*3/uL (ref 0.9–3.3)

## 2010-09-19 ENCOUNTER — Encounter (HOSPITAL_BASED_OUTPATIENT_CLINIC_OR_DEPARTMENT_OTHER): Payer: Medicare Other | Admitting: Oncology

## 2010-09-19 ENCOUNTER — Other Ambulatory Visit: Payer: Self-pay | Admitting: Oncology

## 2010-09-19 DIAGNOSIS — D649 Anemia, unspecified: Secondary | ICD-10-CM

## 2010-09-19 DIAGNOSIS — N289 Disorder of kidney and ureter, unspecified: Secondary | ICD-10-CM

## 2010-09-19 DIAGNOSIS — C50419 Malignant neoplasm of upper-outer quadrant of unspecified female breast: Secondary | ICD-10-CM

## 2010-09-19 LAB — CBC WITH DIFFERENTIAL/PLATELET
BASO%: 0.3 % (ref 0.0–2.0)
EOS%: 1.9 % (ref 0.0–7.0)
HCT: 31.9 % — ABNORMAL LOW (ref 34.8–46.6)
LYMPH%: 19.3 % (ref 14.0–49.7)
MCH: 24.1 pg — ABNORMAL LOW (ref 25.1–34.0)
MCHC: 31.7 g/dL (ref 31.5–36.0)
MCV: 75.9 fL — ABNORMAL LOW (ref 79.5–101.0)
NEUT%: 71.8 % (ref 38.4–76.8)
Platelets: 242 10*3/uL (ref 145–400)

## 2010-10-17 ENCOUNTER — Other Ambulatory Visit: Payer: Self-pay | Admitting: Oncology

## 2010-10-17 ENCOUNTER — Encounter (HOSPITAL_BASED_OUTPATIENT_CLINIC_OR_DEPARTMENT_OTHER): Payer: Medicare Other | Admitting: Oncology

## 2010-10-17 DIAGNOSIS — D649 Anemia, unspecified: Secondary | ICD-10-CM

## 2010-10-17 DIAGNOSIS — C50419 Malignant neoplasm of upper-outer quadrant of unspecified female breast: Secondary | ICD-10-CM

## 2010-10-17 DIAGNOSIS — N289 Disorder of kidney and ureter, unspecified: Secondary | ICD-10-CM

## 2010-10-17 LAB — CBC WITH DIFFERENTIAL/PLATELET
BASO%: 0.5 % (ref 0.0–2.0)
EOS%: 2.4 % (ref 0.0–7.0)
MCH: 24.4 pg — ABNORMAL LOW (ref 25.1–34.0)
MCHC: 32 g/dL (ref 31.5–36.0)
MCV: 76.2 fL — ABNORMAL LOW (ref 79.5–101.0)
MONO%: 6.1 % (ref 0.0–14.0)
NEUT%: 72.8 % (ref 38.4–76.8)
RDW: 16.3 % — ABNORMAL HIGH (ref 11.2–14.5)
lymph#: 1.2 10*3/uL (ref 0.9–3.3)

## 2010-10-24 ENCOUNTER — Encounter: Payer: Medicare Other | Admitting: Oncology

## 2010-10-24 ENCOUNTER — Other Ambulatory Visit: Payer: Self-pay | Admitting: Oncology

## 2010-10-24 ENCOUNTER — Encounter (HOSPITAL_BASED_OUTPATIENT_CLINIC_OR_DEPARTMENT_OTHER): Payer: Medicare Other | Admitting: Oncology

## 2010-10-24 DIAGNOSIS — C50419 Malignant neoplasm of upper-outer quadrant of unspecified female breast: Secondary | ICD-10-CM

## 2010-10-24 DIAGNOSIS — N289 Disorder of kidney and ureter, unspecified: Secondary | ICD-10-CM

## 2010-10-24 DIAGNOSIS — Z170 Estrogen receptor positive status [ER+]: Secondary | ICD-10-CM

## 2010-10-24 DIAGNOSIS — D649 Anemia, unspecified: Secondary | ICD-10-CM

## 2010-10-24 LAB — CBC & DIFF AND RETIC
Eosinophils Absolute: 0 10*3/uL (ref 0.0–0.5)
HGB: 10.7 g/dL — ABNORMAL LOW (ref 11.6–15.9)
MONO#: 1.1 10*3/uL — ABNORMAL HIGH (ref 0.1–0.9)
NEUT#: 11.8 10*3/uL — ABNORMAL HIGH (ref 1.5–6.5)
Platelets: 300 10*3/uL (ref 145–400)
RBC: 4.58 10*6/uL (ref 3.70–5.45)
RDW: 16.2 % — ABNORMAL HIGH (ref 11.2–14.5)
Retic %: 3.91 % — ABNORMAL HIGH (ref 0.70–2.10)
Retic Ct Abs: 179.08 10*3/uL — ABNORMAL HIGH (ref 33.70–90.70)
WBC: 13.9 10*3/uL — ABNORMAL HIGH (ref 3.9–10.3)

## 2010-10-24 LAB — MORPHOLOGY: PLT EST: ADEQUATE

## 2010-10-25 LAB — COMPREHENSIVE METABOLIC PANEL
Albumin: 3.7 g/dL (ref 3.5–5.2)
Alkaline Phosphatase: 85 U/L (ref 39–117)
BUN: 27 mg/dL — ABNORMAL HIGH (ref 6–23)
CO2: 24 mEq/L (ref 19–32)
Calcium: 9.3 mg/dL (ref 8.4–10.5)
Chloride: 102 mEq/L (ref 96–112)
Glucose, Bld: 260 mg/dL — ABNORMAL HIGH (ref 70–99)
Potassium: 4.1 mEq/L (ref 3.5–5.3)
Sodium: 138 mEq/L (ref 135–145)
Total Protein: 6.7 g/dL (ref 6.0–8.3)

## 2010-10-25 LAB — IRON AND TIBC
Iron: 49 ug/dL (ref 42–145)
UIBC: 126 ug/dL (ref 125–400)

## 2010-10-25 LAB — FERRITIN: Ferritin: 286 ng/mL (ref 10–291)

## 2010-10-31 ENCOUNTER — Encounter (HOSPITAL_BASED_OUTPATIENT_CLINIC_OR_DEPARTMENT_OTHER): Payer: Medicare Other | Admitting: Oncology

## 2010-10-31 DIAGNOSIS — N289 Disorder of kidney and ureter, unspecified: Secondary | ICD-10-CM

## 2010-10-31 DIAGNOSIS — D649 Anemia, unspecified: Secondary | ICD-10-CM

## 2010-10-31 DIAGNOSIS — Z170 Estrogen receptor positive status [ER+]: Secondary | ICD-10-CM

## 2010-10-31 DIAGNOSIS — C50419 Malignant neoplasm of upper-outer quadrant of unspecified female breast: Secondary | ICD-10-CM

## 2010-11-14 ENCOUNTER — Other Ambulatory Visit: Payer: Self-pay | Admitting: Oncology

## 2010-11-14 ENCOUNTER — Encounter (HOSPITAL_BASED_OUTPATIENT_CLINIC_OR_DEPARTMENT_OTHER): Payer: Medicare Other | Admitting: Oncology

## 2010-11-14 DIAGNOSIS — C50419 Malignant neoplasm of upper-outer quadrant of unspecified female breast: Secondary | ICD-10-CM

## 2010-11-14 DIAGNOSIS — N289 Disorder of kidney and ureter, unspecified: Secondary | ICD-10-CM

## 2010-11-14 DIAGNOSIS — D649 Anemia, unspecified: Secondary | ICD-10-CM

## 2010-11-14 LAB — CBC WITH DIFFERENTIAL/PLATELET
BASO%: 0.3 % (ref 0.0–2.0)
Basophils Absolute: 0 10*3/uL (ref 0.0–0.1)
EOS%: 1.9 % (ref 0.0–7.0)
HGB: 10.4 g/dL — ABNORMAL LOW (ref 11.6–15.9)
MCH: 24.4 pg — ABNORMAL LOW (ref 25.1–34.0)
MONO#: 0.5 10*3/uL (ref 0.1–0.9)
NEUT#: 5.4 10*3/uL (ref 1.5–6.5)
RDW: 16.6 % — ABNORMAL HIGH (ref 11.2–14.5)
WBC: 7.2 10*3/uL (ref 3.9–10.3)
lymph#: 1.2 10*3/uL (ref 0.9–3.3)

## 2010-11-27 ENCOUNTER — Telehealth: Payer: Self-pay | Admitting: *Deleted

## 2010-11-27 NOTE — Telephone Encounter (Signed)
Spoke with patient twenty minutes about a new medication she reports our office prescribed two weeks ago.  (Unable to name or spell medicine with bottle in hand).  Reports taking this medicine every morning and feeling so hot in the afternoon/evening.  Also not sleeping well since she started this medicine.  Reports "takes this white cancer pill before taking femara.  I also take D3-200."  Asked if she has Peridin -C.  Was able to find this and I explained this is for her hot flashes.  Contends that I do not have her meds correct but she is unable to tell me what her meds are for or read the bottles.  Will notify providers of feeling hot and trouble sleeping.  Patient unable to tell me the name of her pharmacy only that it's located on [address]  Can be reached at [phone].  Asked that she bring meds in to receive a medication chart of what medications and how to take home medications.

## 2010-12-01 ENCOUNTER — Telehealth: Payer: Self-pay | Admitting: *Deleted

## 2010-12-01 NOTE — Telephone Encounter (Signed)
HER MEDICATION FOR HOT FLASHES IS NOT HELPING.

## 2010-12-01 NOTE — Telephone Encounter (Signed)
Per Laroy Apple RN-Pt called and rn advised to increase Peridin-C tablets to 2 per day and increase Vitamin D and continue on Femara.  Pt is to call next week if no improvement.

## 2010-12-05 ENCOUNTER — Telehealth: Payer: Self-pay

## 2010-12-05 NOTE — Telephone Encounter (Signed)
Received call from pt stating that she wants Dr. Donnie Coffin to examine her because she is having pain in her breasts and wants to know why she is having them.  Pt describes these as intermittent sharp pains under both of her breasts.  Pt states this may be because she wore a tight fitting bra recently.  Pt states she has hydrocodone-apap that she has been using for the pain, which helps.  Pt is adamant about being examined for this symptom.  Informed her will give message to desk RN/MD and office will call her back.  Her return number is [phone].

## 2010-12-09 ENCOUNTER — Telehealth: Payer: Self-pay | Admitting: *Deleted

## 2010-12-09 NOTE — Telephone Encounter (Signed)
Attempted to call pt regarding previous conversation with Faith Rogue, RN. Per MD, pt is to contact her surgeon regarding pain.

## 2010-12-12 ENCOUNTER — Other Ambulatory Visit (HOSPITAL_BASED_OUTPATIENT_CLINIC_OR_DEPARTMENT_OTHER): Payer: Medicare Other | Admitting: Lab

## 2010-12-12 ENCOUNTER — Ambulatory Visit: Payer: Medicare Other

## 2010-12-12 ENCOUNTER — Other Ambulatory Visit: Payer: Self-pay | Admitting: Oncology

## 2010-12-12 ENCOUNTER — Encounter: Payer: Self-pay | Admitting: *Deleted

## 2010-12-12 DIAGNOSIS — C50419 Malignant neoplasm of upper-outer quadrant of unspecified female breast: Secondary | ICD-10-CM

## 2010-12-12 LAB — CBC WITH DIFFERENTIAL/PLATELET
Eosinophils Absolute: 0.1 10*3/uL (ref 0.0–0.5)
MONO#: 0.5 10*3/uL (ref 0.1–0.9)
NEUT#: 5.9 10*3/uL (ref 1.5–6.5)
RBC: 4.57 10*6/uL (ref 3.70–5.45)
RDW: 16.6 % — ABNORMAL HIGH (ref 11.2–14.5)
WBC: 7.8 10*3/uL (ref 3.9–10.3)
lymph#: 1.2 10*3/uL (ref 0.9–3.3)

## 2010-12-15 ENCOUNTER — Telehealth (INDEPENDENT_AMBULATORY_CARE_PROVIDER_SITE_OTHER): Payer: Self-pay

## 2010-12-15 NOTE — Telephone Encounter (Signed)
Ramona from [facility] called to report that the pt has reported that she has a rash.  She has just been fitted with new bras, so I advised Ramona to check the rash to see if she thought it needed medical attention.  If so, I recommended she contact the pt's PCP.  She agreed.

## 2010-12-17 ENCOUNTER — Telehealth: Payer: Self-pay | Admitting: *Deleted

## 2010-12-17 NOTE — Telephone Encounter (Signed)
[name] called reporting she has decreased vision since she started taking the D3 200 medicine.  This is bothering my eyesight and my vision is not clear.  Reports this started a week after starting the D3 which she started less than a month ago.  Will notify providers but instructed her to call Dr. Leslie Dales as well.  Pt. Is diabetic.  Checked blood sugar at 5:45am and it equaled 187.

## 2010-12-18 ENCOUNTER — Telehealth: Payer: Self-pay | Admitting: *Deleted

## 2010-12-18 NOTE — Telephone Encounter (Signed)
Pt advised to contact PCP for vision changes and increased blood glucose  Pt stated that she has previously spoken with her PCP and the issue is resolved

## 2011-01-09 ENCOUNTER — Other Ambulatory Visit (HOSPITAL_BASED_OUTPATIENT_CLINIC_OR_DEPARTMENT_OTHER): Payer: Medicare Other | Admitting: Lab

## 2011-01-09 ENCOUNTER — Other Ambulatory Visit: Payer: Self-pay | Admitting: Oncology

## 2011-01-09 ENCOUNTER — Ambulatory Visit (HOSPITAL_BASED_OUTPATIENT_CLINIC_OR_DEPARTMENT_OTHER): Payer: Medicare Other

## 2011-01-09 VITALS — BP 140/70 | HR 58 | Temp 98.6°F

## 2011-01-09 DIAGNOSIS — C50419 Malignant neoplasm of upper-outer quadrant of unspecified female breast: Secondary | ICD-10-CM

## 2011-01-09 DIAGNOSIS — D649 Anemia, unspecified: Secondary | ICD-10-CM

## 2011-01-09 DIAGNOSIS — N289 Disorder of kidney and ureter, unspecified: Secondary | ICD-10-CM

## 2011-01-09 LAB — CBC WITH DIFFERENTIAL/PLATELET
BASO%: 0.2 % (ref 0.0–2.0)
Basophils Absolute: 0 10*3/uL (ref 0.0–0.1)
EOS%: 1.9 % (ref 0.0–7.0)
HGB: 9.7 g/dL — ABNORMAL LOW (ref 11.6–15.9)
MCH: 24.7 pg — ABNORMAL LOW (ref 25.1–34.0)
MCHC: 32.1 g/dL (ref 31.5–36.0)
MCV: 76.9 fL — ABNORMAL LOW (ref 79.5–101.0)
MONO%: 6.2 % (ref 0.0–14.0)
RBC: 3.94 10*6/uL (ref 3.70–5.45)
RDW: 15.6 % — ABNORMAL HIGH (ref 11.2–14.5)
lymph#: 1.3 10*3/uL (ref 0.9–3.3)

## 2011-01-09 MED ORDER — DARBEPOETIN ALFA-POLYSORBATE 500 MCG/ML IJ SOLN
300.0000 ug | Freq: Once | INTRAMUSCULAR | Status: AC
  Administered 2011-01-09: 300 ug via SUBCUTANEOUS
  Filled 2011-01-09: qty 1

## 2011-01-15 ENCOUNTER — Encounter: Payer: Self-pay | Admitting: *Deleted

## 2011-01-15 NOTE — Progress Notes (Signed)
Clinical Social Work received referral from [employer] provider, stating pt having difficulty getting to [facility] appts due to transportation. CSW spoke with pt by phone several times and met with pt at previous appointment. CSW made referral to [facility] and explained process to pt. Ms. Hitsman is very anxious regarding transportation issues. CSW will follow up to determine if [facility] is able to provide transportation to [facility] appts.  Kathrin Penner, MSW, [facility] Clinical Social Worker [facility] [phone]

## 2011-01-21 ENCOUNTER — Telehealth: Payer: Self-pay | Admitting: *Deleted

## 2011-01-21 NOTE — Telephone Encounter (Signed)
CSW spoke with [place] from [facility] today and confirmed they will be able to provide transportation to future cancer center appointments. CSW called and informed pt. CSW assured pt she is available in case of any concerns.  Kathrin Penner, MSW, [facility] Clinical Social Worker [facility] [phone]

## 2011-01-22 DIAGNOSIS — B351 Tinea unguium: Secondary | ICD-10-CM | POA: Diagnosis not present

## 2011-01-22 DIAGNOSIS — M79609 Pain in unspecified limb: Secondary | ICD-10-CM | POA: Diagnosis not present

## 2011-02-06 ENCOUNTER — Other Ambulatory Visit: Payer: Medicare Other | Admitting: Lab

## 2011-02-06 ENCOUNTER — Ambulatory Visit (HOSPITAL_BASED_OUTPATIENT_CLINIC_OR_DEPARTMENT_OTHER): Payer: Medicare Other

## 2011-02-06 VITALS — BP 129/73 | HR 60 | Temp 98.6°F

## 2011-02-06 DIAGNOSIS — D649 Anemia, unspecified: Secondary | ICD-10-CM

## 2011-02-06 DIAGNOSIS — C50419 Malignant neoplasm of upper-outer quadrant of unspecified female breast: Secondary | ICD-10-CM | POA: Diagnosis not present

## 2011-02-06 LAB — CBC WITH DIFFERENTIAL/PLATELET
EOS%: 1.6 % (ref 0.0–7.0)
Eosinophils Absolute: 0.2 10*3/uL (ref 0.0–0.5)
MCV: 79.1 fL — ABNORMAL LOW (ref 79.5–101.0)
MONO%: 6.9 % (ref 0.0–14.0)
NEUT#: 7.1 10*3/uL — ABNORMAL HIGH (ref 1.5–6.5)
RBC: 4.11 10*6/uL (ref 3.70–5.45)
RDW: 15.3 % — ABNORMAL HIGH (ref 11.2–14.5)
lymph#: 1.4 10*3/uL (ref 0.9–3.3)

## 2011-02-06 MED ORDER — DARBEPOETIN ALFA-POLYSORBATE 300 MCG/0.6ML IJ SOLN
300.0000 ug | Freq: Once | INTRAMUSCULAR | Status: AC
  Administered 2011-02-06: 300 ug via SUBCUTANEOUS
  Filled 2011-02-06: qty 0.6

## 2011-03-05 ENCOUNTER — Telehealth: Payer: Self-pay | Admitting: *Deleted

## 2011-03-05 NOTE — Telephone Encounter (Signed)
INSTRUCTED PT. HOW SHE SHOULD TAKE HER FEMARA AND PERIDIN C. SHE VOICES UNDERSTANDING.

## 2011-03-06 ENCOUNTER — Other Ambulatory Visit: Payer: Medicare Other | Admitting: Lab

## 2011-03-06 ENCOUNTER — Ambulatory Visit (HOSPITAL_BASED_OUTPATIENT_CLINIC_OR_DEPARTMENT_OTHER): Payer: Medicare Other

## 2011-03-06 VITALS — BP 145/72 | HR 57 | Temp 98.2°F

## 2011-03-06 DIAGNOSIS — D649 Anemia, unspecified: Secondary | ICD-10-CM | POA: Diagnosis not present

## 2011-03-06 LAB — CBC WITH DIFFERENTIAL/PLATELET
Basophils Absolute: 0 10*3/uL (ref 0.0–0.1)
EOS%: 1.5 % (ref 0.0–7.0)
HGB: 10.5 g/dL — ABNORMAL LOW (ref 11.6–15.9)
MCH: 25 pg — ABNORMAL LOW (ref 25.1–34.0)
MCV: 78.7 fL — ABNORMAL LOW (ref 79.5–101.0)
MONO%: 7.8 % (ref 0.0–14.0)
NEUT#: 6.1 10*3/uL (ref 1.5–6.5)
RBC: 4.2 10*6/uL (ref 3.70–5.45)
RDW: 15.2 % — ABNORMAL HIGH (ref 11.2–14.5)
lymph#: 1.6 10*3/uL (ref 0.9–3.3)

## 2011-03-06 MED ORDER — DARBEPOETIN ALFA-POLYSORBATE 500 MCG/ML IJ SOLN
300.0000 ug | Freq: Once | INTRAMUSCULAR | Status: AC
  Administered 2011-03-06: 300 ug via SUBCUTANEOUS
  Filled 2011-03-06: qty 1

## 2011-03-09 DIAGNOSIS — R82998 Other abnormal findings in urine: Secondary | ICD-10-CM | POA: Diagnosis not present

## 2011-03-09 DIAGNOSIS — E039 Hypothyroidism, unspecified: Secondary | ICD-10-CM | POA: Diagnosis not present

## 2011-03-09 DIAGNOSIS — E119 Type 2 diabetes mellitus without complications: Secondary | ICD-10-CM | POA: Diagnosis not present

## 2011-03-09 DIAGNOSIS — D649 Anemia, unspecified: Secondary | ICD-10-CM | POA: Diagnosis not present

## 2011-04-02 DIAGNOSIS — B351 Tinea unguium: Secondary | ICD-10-CM | POA: Diagnosis not present

## 2011-04-02 DIAGNOSIS — M79609 Pain in unspecified limb: Secondary | ICD-10-CM | POA: Diagnosis not present

## 2011-04-03 ENCOUNTER — Ambulatory Visit (HOSPITAL_BASED_OUTPATIENT_CLINIC_OR_DEPARTMENT_OTHER): Payer: Medicare Other

## 2011-04-03 ENCOUNTER — Other Ambulatory Visit (HOSPITAL_BASED_OUTPATIENT_CLINIC_OR_DEPARTMENT_OTHER): Payer: Medicare Other | Admitting: Lab

## 2011-04-03 VITALS — BP 158/69 | HR 59 | Temp 98.5°F

## 2011-04-03 DIAGNOSIS — D649 Anemia, unspecified: Secondary | ICD-10-CM | POA: Diagnosis not present

## 2011-04-03 DIAGNOSIS — C50419 Malignant neoplasm of upper-outer quadrant of unspecified female breast: Secondary | ICD-10-CM

## 2011-04-03 LAB — CBC WITH DIFFERENTIAL/PLATELET
Basophils Absolute: 0 10*3/uL (ref 0.0–0.1)
Eosinophils Absolute: 0.1 10*3/uL (ref 0.0–0.5)
HGB: 10.7 g/dL — ABNORMAL LOW (ref 11.6–15.9)
MCV: 79.2 fL — ABNORMAL LOW (ref 79.5–101.0)
MONO#: 0.6 10*3/uL (ref 0.1–0.9)
NEUT#: 6.6 10*3/uL — ABNORMAL HIGH (ref 1.5–6.5)
RBC: 4.35 10*6/uL (ref 3.70–5.45)
RDW: 14.8 % — ABNORMAL HIGH (ref 11.2–14.5)
WBC: 8.8 10*3/uL (ref 3.9–10.3)
lymph#: 1.4 10*3/uL (ref 0.9–3.3)

## 2011-04-03 MED ORDER — DARBEPOETIN ALFA-POLYSORBATE 300 MCG/0.6ML IJ SOLN
300.0000 ug | Freq: Once | INTRAMUSCULAR | Status: AC
  Administered 2011-04-03: 300 ug via SUBCUTANEOUS
  Filled 2011-04-03: qty 0.6

## 2011-04-22 ENCOUNTER — Other Ambulatory Visit: Payer: Self-pay | Admitting: *Deleted

## 2011-04-30 DIAGNOSIS — M1710 Unilateral primary osteoarthritis, unspecified knee: Secondary | ICD-10-CM | POA: Diagnosis not present

## 2011-04-30 DIAGNOSIS — I10 Essential (primary) hypertension: Secondary | ICD-10-CM | POA: Diagnosis not present

## 2011-04-30 DIAGNOSIS — IMO0002 Reserved for concepts with insufficient information to code with codable children: Secondary | ICD-10-CM | POA: Diagnosis not present

## 2011-04-30 DIAGNOSIS — K5900 Constipation, unspecified: Secondary | ICD-10-CM | POA: Diagnosis not present

## 2011-04-30 DIAGNOSIS — E119 Type 2 diabetes mellitus without complications: Secondary | ICD-10-CM | POA: Diagnosis not present

## 2011-04-30 DIAGNOSIS — E559 Vitamin D deficiency, unspecified: Secondary | ICD-10-CM | POA: Diagnosis not present

## 2011-04-30 DIAGNOSIS — I6789 Other cerebrovascular disease: Secondary | ICD-10-CM | POA: Diagnosis not present

## 2011-04-30 DIAGNOSIS — E039 Hypothyroidism, unspecified: Secondary | ICD-10-CM | POA: Diagnosis not present

## 2011-04-30 DIAGNOSIS — D649 Anemia, unspecified: Secondary | ICD-10-CM | POA: Diagnosis not present

## 2011-05-01 ENCOUNTER — Other Ambulatory Visit (HOSPITAL_BASED_OUTPATIENT_CLINIC_OR_DEPARTMENT_OTHER): Payer: Medicare Other | Admitting: Lab

## 2011-05-01 ENCOUNTER — Telehealth: Payer: Self-pay | Admitting: Oncology

## 2011-05-01 ENCOUNTER — Ambulatory Visit (HOSPITAL_BASED_OUTPATIENT_CLINIC_OR_DEPARTMENT_OTHER): Payer: Medicare Other | Admitting: Oncology

## 2011-05-01 VITALS — BP 136/78 | HR 57 | Temp 99.1°F | Ht 66.0 in | Wt 233.5 lb

## 2011-05-01 DIAGNOSIS — C50919 Malignant neoplasm of unspecified site of unspecified female breast: Secondary | ICD-10-CM | POA: Diagnosis not present

## 2011-05-01 DIAGNOSIS — M899 Disorder of bone, unspecified: Secondary | ICD-10-CM | POA: Diagnosis not present

## 2011-05-01 DIAGNOSIS — D649 Anemia, unspecified: Secondary | ICD-10-CM

## 2011-05-01 DIAGNOSIS — M949 Disorder of cartilage, unspecified: Secondary | ICD-10-CM | POA: Diagnosis not present

## 2011-05-01 DIAGNOSIS — C50419 Malignant neoplasm of upper-outer quadrant of unspecified female breast: Secondary | ICD-10-CM | POA: Diagnosis not present

## 2011-05-01 DIAGNOSIS — Z170 Estrogen receptor positive status [ER+]: Secondary | ICD-10-CM | POA: Diagnosis not present

## 2011-05-01 DIAGNOSIS — N289 Disorder of kidney and ureter, unspecified: Secondary | ICD-10-CM | POA: Diagnosis not present

## 2011-05-01 DIAGNOSIS — E559 Vitamin D deficiency, unspecified: Secondary | ICD-10-CM | POA: Diagnosis not present

## 2011-05-01 LAB — CBC & DIFF AND RETIC
BASO%: 0.4 % (ref 0.0–2.0)
Eosinophils Absolute: 0.1 10*3/uL (ref 0.0–0.5)
MCHC: 32.3 g/dL (ref 31.5–36.0)
MCV: 75.1 fL — ABNORMAL LOW (ref 79.5–101.0)
MONO%: 5.7 % (ref 0.0–14.0)
NEUT#: 5.9 10*3/uL (ref 1.5–6.5)
RBC: 4.54 10*6/uL (ref 3.70–5.45)
RDW: 15.1 % — ABNORMAL HIGH (ref 11.2–14.5)
Retic %: 0.77 % (ref 0.70–2.10)
Retic Ct Abs: 34.96 10*3/uL (ref 33.70–90.70)
WBC: 8 10*3/uL (ref 3.9–10.3)

## 2011-05-01 LAB — MORPHOLOGY: PLT EST: ADEQUATE

## 2011-05-01 NOTE — Telephone Encounter (Signed)
gv pt appt schedule for may thru oct  °

## 2011-05-01 NOTE — Progress Notes (Signed)
Hematology and Oncology Follow Up Visit  [name] [id number] [birth year] 78 y.o. 05/01/2011 11:55 AM PCP  Principle Diagnosis: PROBLEM LIST:   1. Node-negative breast cancer, status post mastectomy, estrogen receptor-positive, progesterone receptor-positive, on Femara since 2010. 2. History of hypertension. 3. History of hypothyroidism. 4. History of type 2 diabetes. 5. History of mild anemia. 6. History of mild renal insufficiency.   Interim History:  There have been no intercurrent illness, hospitalizations or medication changes. She is c/o poor sleep .  Medications: I have reviewed the patient's current medications.  Allergies:  Allergies  Allergen Reactions  . Aspirin   . Insulins     NOVA?  Marland Kitchen Penicillins     Past Medical History, Surgical history, Social history, and Family History were reviewed and updated.  Review of Systems: Constitutional:  Negative for fever, chills, night sweats, anorexia, weight loss, pain. Cardiovascular: no chest pain or dyspnea on exertion Respiratory: no cough, shortness of breath, or wheezing Neurological: negative Dermatological: negative ENT: negative Skin Gastrointestinal: negative Genito-Urinary: negative Hematological and Lymphatic: negative Breast: negative Musculoskeletal: negative Remaining ROS negative.  Physical Exam: Blood pressure 136/78, pulse 57, temperature 99.1 F (37.3 C), height 5\' 6"  (1.676 m), weight 233 lb 8 oz (105.915 kg). ECOG: 0 General appearance: alert, cooperative and appears stated age Head: Normocephalic, without obvious abnormality, atraumatic Neck: no adenopathy, no carotid bruit, no JVD, supple, symmetrical, trachea midline and thyroid not enlarged, symmetric, no tenderness/mass/nodules Lymph nodes: Cervical, supraclavicular, and axillary nodes normal. Cardiac : regular rate and rhythm Pulmonary:clear to auscultation bilaterally Breasts: inspection negative, no nipple discharge or bleeding,  no masses or nodularity palpable, s/p lt mrm, w/out evidence of local recurrence Abdomen:soft, non-tender; bowel sounds normal; no masses,  no organomegaly Extremities negative Neuro: alert, oriented, normal speech, no focal findings or movement disorder noted  Lab Results: Lab Results  Component Value Date   WBC 8.0 05/01/2011   HGB 11.0* 05/01/2011   HCT 34.1* 05/01/2011   MCV 75.1* 05/01/2011   PLT 247 05/01/2011     Chemistry      Component Value Date/Time   NA 138 10/24/2010 1137   NA 138 10/24/2010 1137   K 4.1 10/24/2010 1137   K 4.1 10/24/2010 1137   CL 102 10/24/2010 1137   CL 102 10/24/2010 1137   CO2 24 10/24/2010 1137   CO2 24 10/24/2010 1137   BUN 27* 10/24/2010 1137   BUN 27* 10/24/2010 1137   CREATININE 1.21* 10/24/2010 1137   CREATININE 1.21* 10/24/2010 1137      Component Value Date/Time   CALCIUM 9.3 10/24/2010 1137   CALCIUM 9.3 10/24/2010 1137   ALKPHOS 85 10/24/2010 1137   ALKPHOS 85 10/24/2010 1137   AST 17 10/24/2010 1137   AST 17 10/24/2010 1137   ALT 13 10/24/2010 1137   ALT 13 10/24/2010 1137   BILITOT 0.3 10/24/2010 1137   BILITOT 0.3 10/24/2010 1137      .pathology. Radiological Studies: chest X-ray n/a Mammogram 6/12- wnl Bone density 6/12-wnl  Impression and Plan: Doing well, she is clnically NED and tolerating femara, f/u 6 months.  More than 50% of the visit was spent in patient-related counselling   Pierce Crane, MD 4/12/201311:55 AM

## 2011-05-05 LAB — COMPREHENSIVE METABOLIC PANEL
ALT: 11 U/L (ref 0–35)
AST: 13 U/L (ref 0–37)
Albumin: 3.4 g/dL — ABNORMAL LOW (ref 3.5–5.2)
Alkaline Phosphatase: 86 U/L (ref 39–117)
BUN: 31 mg/dL — ABNORMAL HIGH (ref 6–23)
Calcium: 9.1 mg/dL (ref 8.4–10.5)
Chloride: 102 mEq/L (ref 96–112)
Potassium: 4.7 mEq/L (ref 3.5–5.3)
Sodium: 137 mEq/L (ref 135–145)
Total Protein: 6.3 g/dL (ref 6.0–8.3)

## 2011-05-05 LAB — PROTEIN ELECTROPHORESIS, SERUM
Alpha-2-Globulin: 13.3 % — ABNORMAL HIGH (ref 7.1–11.8)
Beta 2: 7.4 % — ABNORMAL HIGH (ref 3.2–6.5)
Beta Globulin: 5.4 % (ref 4.7–7.2)

## 2011-05-11 DIAGNOSIS — E890 Postprocedural hypothyroidism: Secondary | ICD-10-CM | POA: Diagnosis not present

## 2011-05-11 DIAGNOSIS — E119 Type 2 diabetes mellitus without complications: Secondary | ICD-10-CM | POA: Diagnosis not present

## 2011-05-11 DIAGNOSIS — I10 Essential (primary) hypertension: Secondary | ICD-10-CM | POA: Diagnosis not present

## 2011-05-11 DIAGNOSIS — G589 Mononeuropathy, unspecified: Secondary | ICD-10-CM | POA: Diagnosis not present

## 2011-05-11 DIAGNOSIS — N289 Disorder of kidney and ureter, unspecified: Secondary | ICD-10-CM | POA: Diagnosis not present

## 2011-05-29 ENCOUNTER — Other Ambulatory Visit: Payer: Medicare Other | Admitting: Lab

## 2011-05-29 ENCOUNTER — Ambulatory Visit: Payer: Medicare Other

## 2011-05-29 ENCOUNTER — Ambulatory Visit (HOSPITAL_BASED_OUTPATIENT_CLINIC_OR_DEPARTMENT_OTHER): Payer: Medicare Other

## 2011-05-29 ENCOUNTER — Other Ambulatory Visit: Payer: Self-pay | Admitting: Physician Assistant

## 2011-05-29 VITALS — BP 131/74 | HR 60 | Temp 98.8°F

## 2011-05-29 DIAGNOSIS — C50919 Malignant neoplasm of unspecified site of unspecified female breast: Secondary | ICD-10-CM | POA: Diagnosis not present

## 2011-05-29 DIAGNOSIS — D649 Anemia, unspecified: Secondary | ICD-10-CM

## 2011-05-29 DIAGNOSIS — C50419 Malignant neoplasm of upper-outer quadrant of unspecified female breast: Secondary | ICD-10-CM | POA: Diagnosis not present

## 2011-05-29 LAB — CBC WITH DIFFERENTIAL/PLATELET
Basophils Absolute: 0 10*3/uL (ref 0.0–0.1)
EOS%: 1 % (ref 0.0–7.0)
Eosinophils Absolute: 0.1 10*3/uL (ref 0.0–0.5)
LYMPH%: 13.4 % — ABNORMAL LOW (ref 14.0–49.7)
MCH: 24 pg — ABNORMAL LOW (ref 25.1–34.0)
MCV: 77.4 fL — ABNORMAL LOW (ref 79.5–101.0)
MONO%: 6.3 % (ref 0.0–14.0)
NEUT#: 7.3 10*3/uL — ABNORMAL HIGH (ref 1.5–6.5)
Platelets: 209 10*3/uL (ref 145–400)
RBC: 4 10*6/uL (ref 3.70–5.45)
RDW: 15 % — ABNORMAL HIGH (ref 11.2–14.5)
nRBC: 0 % (ref 0–0)

## 2011-05-29 MED ORDER — SODIUM CHLORIDE 0.9 % IJ SOLN
10.0000 mL | INTRAMUSCULAR | Status: DC | PRN
  Filled 2011-05-29: qty 10

## 2011-05-29 MED ORDER — DARBEPOETIN ALFA-POLYSORBATE 300 MCG/0.6ML IJ SOLN
300.0000 ug | INTRAMUSCULAR | Status: DC
  Administered 2011-05-29: 300 ug via SUBCUTANEOUS
  Filled 2011-05-29: qty 0.6

## 2011-05-29 MED ORDER — HEPARIN SOD (PORK) LOCK FLUSH 100 UNIT/ML IV SOLN
500.0000 [IU] | Freq: Once | INTRAVENOUS | Status: DC
  Filled 2011-05-29: qty 5

## 2011-06-04 DIAGNOSIS — B351 Tinea unguium: Secondary | ICD-10-CM | POA: Diagnosis not present

## 2011-06-04 DIAGNOSIS — M79609 Pain in unspecified limb: Secondary | ICD-10-CM | POA: Diagnosis not present

## 2011-06-22 ENCOUNTER — Other Ambulatory Visit: Payer: Self-pay | Admitting: Oncology

## 2011-06-22 ENCOUNTER — Other Ambulatory Visit: Payer: Self-pay | Admitting: Endocrinology

## 2011-06-22 DIAGNOSIS — Z1231 Encounter for screening mammogram for malignant neoplasm of breast: Secondary | ICD-10-CM

## 2011-06-26 ENCOUNTER — Ambulatory Visit (HOSPITAL_BASED_OUTPATIENT_CLINIC_OR_DEPARTMENT_OTHER): Payer: Medicare Other

## 2011-06-26 ENCOUNTER — Ambulatory Visit: Payer: Medicare Other

## 2011-06-26 ENCOUNTER — Other Ambulatory Visit (HOSPITAL_BASED_OUTPATIENT_CLINIC_OR_DEPARTMENT_OTHER): Payer: Medicare Other | Admitting: Lab

## 2011-06-26 ENCOUNTER — Other Ambulatory Visit: Payer: Medicare Other | Admitting: Lab

## 2011-06-26 VITALS — BP 138/72 | HR 52 | Temp 97.2°F

## 2011-06-26 DIAGNOSIS — C50419 Malignant neoplasm of upper-outer quadrant of unspecified female breast: Secondary | ICD-10-CM

## 2011-06-26 DIAGNOSIS — D649 Anemia, unspecified: Secondary | ICD-10-CM

## 2011-06-26 LAB — CBC WITH DIFFERENTIAL/PLATELET
Basophils Absolute: 0.1 10*3/uL (ref 0.0–0.1)
Eosinophils Absolute: 0.1 10*3/uL (ref 0.0–0.5)
HGB: 9.8 g/dL — ABNORMAL LOW (ref 11.6–15.9)
MCV: 78.7 fL — ABNORMAL LOW (ref 79.5–101.0)
MONO#: 0.6 10*3/uL (ref 0.1–0.9)
NEUT#: 5.5 10*3/uL (ref 1.5–6.5)
RBC: 4.1 10*6/uL (ref 3.70–5.45)
RDW: 14.9 % — ABNORMAL HIGH (ref 11.2–14.5)
WBC: 7.8 10*3/uL (ref 3.9–10.3)
lymph#: 1.6 10*3/uL (ref 0.9–3.3)

## 2011-06-26 MED ORDER — DARBEPOETIN ALFA-POLYSORBATE 300 MCG/0.6ML IJ SOLN
300.0000 ug | INTRAMUSCULAR | Status: DC
  Administered 2011-06-26: 300 ug via SUBCUTANEOUS
  Filled 2011-06-26: qty 0.6

## 2011-07-01 ENCOUNTER — Encounter: Payer: Self-pay | Admitting: *Deleted

## 2011-07-01 NOTE — Progress Notes (Signed)
CSW contacted [facility] and put in request for transportation assistance for appointments at [facility] on 7/5, 8/2, 8/30, 9/27, 10/10, and 10/25.  CSW will contact patient to verify transportation support once she receives feedback from [facility].  Kathrin Penner, MSW, [facility] Clinical Social Worker [facility] [phone]

## 2011-07-13 ENCOUNTER — Ambulatory Visit
Admission: RE | Admit: 2011-07-13 | Discharge: 2011-07-13 | Disposition: A | Discharge status: Home / Self Care | Payer: Medicare Other | Source: Ambulatory Visit | Attending: Endocrinology | Admitting: Endocrinology

## 2011-07-13 ENCOUNTER — Other Ambulatory Visit: Payer: Self-pay | Admitting: Endocrinology

## 2011-07-13 DIAGNOSIS — Z1231 Encounter for screening mammogram for malignant neoplasm of breast: Secondary | ICD-10-CM

## 2011-07-14 ENCOUNTER — Ambulatory Visit: Payer: Medicare Other

## 2011-07-24 ENCOUNTER — Other Ambulatory Visit (HOSPITAL_BASED_OUTPATIENT_CLINIC_OR_DEPARTMENT_OTHER): Payer: Medicare Other | Admitting: Lab

## 2011-07-24 ENCOUNTER — Other Ambulatory Visit: Payer: Medicare Other | Admitting: Lab

## 2011-07-24 ENCOUNTER — Ambulatory Visit (HOSPITAL_BASED_OUTPATIENT_CLINIC_OR_DEPARTMENT_OTHER): Payer: Medicare Other

## 2011-07-24 ENCOUNTER — Ambulatory Visit: Payer: Medicare Other

## 2011-07-24 VITALS — BP 146/73 | HR 58 | Temp 97.5°F

## 2011-07-24 DIAGNOSIS — D649 Anemia, unspecified: Secondary | ICD-10-CM

## 2011-07-24 LAB — CBC WITH DIFFERENTIAL/PLATELET
Basophils Absolute: 0.1 10*3/uL (ref 0.0–0.1)
Eosinophils Absolute: 0.1 10*3/uL (ref 0.0–0.5)
HCT: 33.7 % — ABNORMAL LOW (ref 34.8–46.6)
HGB: 10.5 g/dL — ABNORMAL LOW (ref 11.6–15.9)
LYMPH%: 17.3 % (ref 14.0–49.7)
MCV: 78.5 fL — ABNORMAL LOW (ref 79.5–101.0)
MONO#: 0.8 10*3/uL (ref 0.1–0.9)
MONO%: 8.5 % (ref 0.0–14.0)
NEUT#: 6.4 10*3/uL (ref 1.5–6.5)
Platelets: 208 10*3/uL (ref 145–400)
WBC: 8.9 10*3/uL (ref 3.9–10.3)

## 2011-07-24 MED ORDER — DARBEPOETIN ALFA-POLYSORBATE 300 MCG/0.6ML IJ SOLN
300.0000 ug | INTRAMUSCULAR | Status: DC
  Administered 2011-07-24: 300 ug via SUBCUTANEOUS
  Filled 2011-07-24: qty 0.6

## 2011-08-10 DIAGNOSIS — I10 Essential (primary) hypertension: Secondary | ICD-10-CM | POA: Diagnosis not present

## 2011-08-10 DIAGNOSIS — E039 Hypothyroidism, unspecified: Secondary | ICD-10-CM | POA: Diagnosis not present

## 2011-08-10 DIAGNOSIS — E7800 Pure hypercholesterolemia, unspecified: Secondary | ICD-10-CM | POA: Diagnosis not present

## 2011-08-10 DIAGNOSIS — E119 Type 2 diabetes mellitus without complications: Secondary | ICD-10-CM | POA: Diagnosis not present

## 2011-08-11 DIAGNOSIS — B351 Tinea unguium: Secondary | ICD-10-CM | POA: Diagnosis not present

## 2011-08-11 DIAGNOSIS — M79609 Pain in unspecified limb: Secondary | ICD-10-CM | POA: Diagnosis not present

## 2011-08-13 ENCOUNTER — Encounter (INDEPENDENT_AMBULATORY_CARE_PROVIDER_SITE_OTHER): Payer: Medicare Other | Admitting: General Surgery

## 2011-08-17 ENCOUNTER — Encounter (INDEPENDENT_AMBULATORY_CARE_PROVIDER_SITE_OTHER): Payer: Self-pay | Admitting: General Surgery

## 2011-08-21 ENCOUNTER — Ambulatory Visit: Payer: Medicare Other

## 2011-08-21 ENCOUNTER — Other Ambulatory Visit (HOSPITAL_BASED_OUTPATIENT_CLINIC_OR_DEPARTMENT_OTHER): Payer: Medicare Other | Admitting: Lab

## 2011-08-21 ENCOUNTER — Other Ambulatory Visit: Payer: Medicare Other | Admitting: Lab

## 2011-08-21 DIAGNOSIS — D649 Anemia, unspecified: Secondary | ICD-10-CM

## 2011-08-21 LAB — CBC WITH DIFFERENTIAL/PLATELET
Eosinophils Absolute: 0.1 10*3/uL (ref 0.0–0.5)
HCT: 36.1 % (ref 34.8–46.6)
HGB: 11.2 g/dL — ABNORMAL LOW (ref 11.6–15.9)
LYMPH%: 20.9 % (ref 14.0–49.7)
MONO#: 0.6 10*3/uL (ref 0.1–0.9)
NEUT#: 5.8 10*3/uL (ref 1.5–6.5)
NEUT%: 69.7 % (ref 38.4–76.8)
Platelets: 214 10*3/uL (ref 145–400)
RBC: 4.62 10*6/uL (ref 3.70–5.45)
WBC: 8.3 10*3/uL (ref 3.9–10.3)

## 2011-08-21 NOTE — Progress Notes (Signed)
Aranesp not given today.  HGB 11.2

## 2011-08-27 DIAGNOSIS — E785 Hyperlipidemia, unspecified: Secondary | ICD-10-CM | POA: Diagnosis not present

## 2011-08-27 DIAGNOSIS — I10 Essential (primary) hypertension: Secondary | ICD-10-CM | POA: Diagnosis not present

## 2011-08-27 DIAGNOSIS — R259 Unspecified abnormal involuntary movements: Secondary | ICD-10-CM | POA: Diagnosis not present

## 2011-08-27 DIAGNOSIS — K5900 Constipation, unspecified: Secondary | ICD-10-CM | POA: Diagnosis not present

## 2011-08-27 DIAGNOSIS — IMO0002 Reserved for concepts with insufficient information to code with codable children: Secondary | ICD-10-CM | POA: Diagnosis not present

## 2011-08-27 DIAGNOSIS — D649 Anemia, unspecified: Secondary | ICD-10-CM | POA: Diagnosis not present

## 2011-08-27 DIAGNOSIS — M1710 Unilateral primary osteoarthritis, unspecified knee: Secondary | ICD-10-CM | POA: Diagnosis not present

## 2011-09-18 ENCOUNTER — Other Ambulatory Visit (HOSPITAL_BASED_OUTPATIENT_CLINIC_OR_DEPARTMENT_OTHER): Payer: Medicare Other | Admitting: Lab

## 2011-09-18 ENCOUNTER — Ambulatory Visit (HOSPITAL_BASED_OUTPATIENT_CLINIC_OR_DEPARTMENT_OTHER): Payer: Medicare Other

## 2011-09-18 ENCOUNTER — Other Ambulatory Visit: Payer: Medicare Other | Admitting: Lab

## 2011-09-18 ENCOUNTER — Ambulatory Visit: Payer: Medicare Other

## 2011-09-18 VITALS — BP 113/68 | HR 57 | Temp 97.9°F

## 2011-09-18 DIAGNOSIS — D649 Anemia, unspecified: Secondary | ICD-10-CM | POA: Diagnosis not present

## 2011-09-18 LAB — CBC WITH DIFFERENTIAL/PLATELET
BASO%: 0.4 % (ref 0.0–2.0)
HCT: 29.2 % — ABNORMAL LOW (ref 34.8–46.6)
LYMPH%: 21.7 % (ref 14.0–49.7)
MCHC: 32.2 g/dL (ref 31.5–36.0)
MONO#: 0.5 10*3/uL (ref 0.1–0.9)
NEUT%: 70.6 % (ref 38.4–76.8)
Platelets: 234 10*3/uL (ref 145–400)
WBC: 8.1 10*3/uL (ref 3.9–10.3)

## 2011-09-18 MED ORDER — DARBEPOETIN ALFA-POLYSORBATE 300 MCG/0.6ML IJ SOLN
300.0000 ug | INTRAMUSCULAR | Status: DC
  Administered 2011-09-18: 300 ug via SUBCUTANEOUS

## 2011-10-13 DIAGNOSIS — M79609 Pain in unspecified limb: Secondary | ICD-10-CM | POA: Diagnosis not present

## 2011-10-13 DIAGNOSIS — B351 Tinea unguium: Secondary | ICD-10-CM | POA: Diagnosis not present

## 2011-10-16 ENCOUNTER — Ambulatory Visit (HOSPITAL_BASED_OUTPATIENT_CLINIC_OR_DEPARTMENT_OTHER): Payer: Medicare Other

## 2011-10-16 ENCOUNTER — Other Ambulatory Visit (HOSPITAL_BASED_OUTPATIENT_CLINIC_OR_DEPARTMENT_OTHER): Payer: Medicare Other | Admitting: Lab

## 2011-10-16 ENCOUNTER — Other Ambulatory Visit: Payer: Medicare Other | Admitting: Lab

## 2011-10-16 ENCOUNTER — Ambulatory Visit: Payer: Medicare Other

## 2011-10-16 VITALS — BP 100/64 | HR 58 | Temp 98.2°F

## 2011-10-16 DIAGNOSIS — D649 Anemia, unspecified: Secondary | ICD-10-CM

## 2011-10-16 LAB — CBC WITH DIFFERENTIAL/PLATELET
BASO%: 0.5 % (ref 0.0–2.0)
EOS%: 1.2 % (ref 0.0–7.0)
HCT: 32.1 % — ABNORMAL LOW (ref 34.8–46.6)
LYMPH%: 18 % (ref 14.0–49.7)
MCH: 24.6 pg — ABNORMAL LOW (ref 25.1–34.0)
MCHC: 31.8 g/dL (ref 31.5–36.0)
MCV: 77.3 fL — ABNORMAL LOW (ref 79.5–101.0)
MONO%: 7.2 % (ref 0.0–14.0)
NEUT%: 73.1 % (ref 38.4–76.8)
Platelets: 236 10*3/uL (ref 145–400)
lymph#: 1.4 10*3/uL (ref 0.9–3.3)

## 2011-10-16 MED ORDER — DARBEPOETIN ALFA-POLYSORBATE 300 MCG/0.6ML IJ SOLN
300.0000 ug | INTRAMUSCULAR | Status: DC
  Administered 2011-10-16: 300 ug via SUBCUTANEOUS
  Filled 2011-10-16: qty 0.6

## 2011-10-19 ENCOUNTER — Other Ambulatory Visit: Payer: Self-pay | Admitting: Oncology

## 2011-10-29 ENCOUNTER — Other Ambulatory Visit (HOSPITAL_BASED_OUTPATIENT_CLINIC_OR_DEPARTMENT_OTHER): Payer: Medicare Other | Admitting: Lab

## 2011-10-29 ENCOUNTER — Telehealth: Payer: Self-pay | Admitting: Oncology

## 2011-10-29 ENCOUNTER — Ambulatory Visit (HOSPITAL_BASED_OUTPATIENT_CLINIC_OR_DEPARTMENT_OTHER): Payer: Medicare Other | Admitting: Oncology

## 2011-10-29 VITALS — BP 130/70 | HR 60 | Temp 98.2°F | Resp 20 | Ht 66.0 in | Wt 211.7 lb

## 2011-10-29 DIAGNOSIS — C50919 Malignant neoplasm of unspecified site of unspecified female breast: Secondary | ICD-10-CM | POA: Diagnosis not present

## 2011-10-29 DIAGNOSIS — D649 Anemia, unspecified: Secondary | ICD-10-CM

## 2011-10-29 DIAGNOSIS — E039 Hypothyroidism, unspecified: Secondary | ICD-10-CM

## 2011-10-29 DIAGNOSIS — E559 Vitamin D deficiency, unspecified: Secondary | ICD-10-CM

## 2011-10-29 DIAGNOSIS — C50419 Malignant neoplasm of upper-outer quadrant of unspecified female breast: Secondary | ICD-10-CM

## 2011-10-29 LAB — CBC WITH DIFFERENTIAL/PLATELET
EOS%: 1.5 % (ref 0.0–7.0)
MCH: 24.7 pg — ABNORMAL LOW (ref 25.1–34.0)
MCHC: 31.4 g/dL — ABNORMAL LOW (ref 31.5–36.0)
MCV: 78.6 fL — ABNORMAL LOW (ref 79.5–101.0)
MONO%: 8 % (ref 0.0–14.0)
RBC: 4.55 10*6/uL (ref 3.70–5.45)
RDW: 17 % — ABNORMAL HIGH (ref 11.2–14.5)

## 2011-10-29 LAB — COMPREHENSIVE METABOLIC PANEL (CC13)
Albumin: 3.3 g/dL — ABNORMAL LOW (ref 3.5–5.0)
BUN: 32 mg/dL — ABNORMAL HIGH (ref 7.0–26.0)
Calcium: 9.4 mg/dL (ref 8.4–10.4)
Chloride: 102 mEq/L (ref 98–107)
Glucose: 71 mg/dl (ref 70–99)
Potassium: 4.4 mEq/L (ref 3.5–5.1)
Sodium: 138 mEq/L (ref 136–145)
Total Protein: 6.4 g/dL (ref 6.4–8.3)

## 2011-10-29 NOTE — Progress Notes (Signed)
Hematology and Oncology Follow Up Visit  [name] [id number] [birth year] 78 y.o. 10/29/2011 12:11 PM PCP  Principle Diagnosis: PROBLEM LIST:   1. Node-negative breast cancer, status post mastectomy, estrogen receptor-positive, progesterone receptor-positive, on Femara since 2010. 2. History of hypertension. 3. History of hypothyroidism. 4. History of type 2 diabetes. 5. History of mild anemia. 6. History of mild renal insufficiency.   Interim History:  There have been no intercurrent illness, hospitalizations or medication changes. She is c/o poor sleep . She is also being seen for chronic anemia and is on monthly Aranesp injections. Her you will is been fairly stable. She is less symptomatic from anemia point of view.  Medications: I have reviewed the patient's current medications.  Allergies:  Allergies  Allergen Reactions  . Aspirin   . Insulins     NOVA?  Marland Kitchen Penicillins     Past Medical History, Surgical history, Social history, and Family History were reviewed and updated.  Review of Systems: Constitutional:  Negative for fever, chills, night sweats, anorexia, weight loss, pain. Cardiovascular: no chest pain or dyspnea on exertion Respiratory: no cough, shortness of breath, or wheezing Neurological: negative Dermatological: negative ENT: negative Skin Gastrointestinal: negative Genito-Urinary: negative Hematological and Lymphatic: negative Breast: negative Musculoskeletal: negative Remaining ROS negative.  Physical Exam: Blood pressure 130/70, pulse 60, temperature 98.2 F (36.8 C), resp. rate 20, height 5\' 6"  (1.676 m), weight 211 lb 11.2 oz (96.026 kg). ECOG: 1 General appearance: alert, cooperative and appears stated age,  Head: Normocephalic, without obvious abnormality, atraumatic Neck: no adenopathy, no carotid bruit, no JVD, supple, symmetrical, trachea midline and thyroid not enlarged, symmetric, no tenderness/mass/nodules Lymph nodes: Cervical,  supraclavicular, and axillary nodes normal. Cardiac : regular rate and rhythm Pulmonary:clear to auscultation bilaterally Breasts: inspection negative, no nipple discharge or bleeding, no masses or nodularity palpable, s/p lt mrm, w/out evidence of local recurrence Abdomen:soft, non-tender; bowel sounds normal; no masses,  no organomegaly Extremities negative Neuro: alert, oriented, normal speech, no focal findings or movement disorder noted  Lab Results: Lab Results  Component Value Date   WBC 8.8 10/29/2011   HGB 11.2* 10/29/2011   HCT 35.7 10/29/2011   MCV 78.6* 10/29/2011   PLT 213 10/29/2011     Chemistry      Component Value Date/Time   NA 137 05/01/2011 1045   K 4.7 05/01/2011 1045   CL 102 05/01/2011 1045   CO2 27 05/01/2011 1045   BUN 31* 05/01/2011 1045   CREATININE 1.30* 05/01/2011 1045      Component Value Date/Time   CALCIUM 9.1 05/01/2011 1045   ALKPHOS 86 05/01/2011 1045   AST 13 05/01/2011 1045   ALT 11 05/01/2011 1045   BILITOT 0.4 05/01/2011 1045      .pathology. Radiological Studies: chest X-ray n/a Mammogram 6/13- wnl Bone density 6/12-wnl  Impression and Plan: Doing well, she is clnically NED and tolerating femara, f/u 6 months. I've given her a schedule for monthly CBCs as well as Aranesp on a when necessary basis based on her current criteria. Despite all her chronic medical issues she seems doing fairly well. I have reviewed her labs with her today and also some recommended recommendation regarding vitamin D supplementation  More than 50% of the visit was spent in patient-related counselling   Pierce Crane, MD 10/10/201312:11 PM

## 2011-10-29 NOTE — Telephone Encounter (Signed)
Disregard prev entry. Wrong md entered. Pt has her oct-may 2014 appt calendar

## 2011-10-29 NOTE — Telephone Encounter (Signed)
gve the pt her oct-may 2014 appt calendar

## 2011-10-30 DIAGNOSIS — M19079 Primary osteoarthritis, unspecified ankle and foot: Secondary | ICD-10-CM | POA: Diagnosis not present

## 2011-10-30 DIAGNOSIS — M25579 Pain in unspecified ankle and joints of unspecified foot: Secondary | ICD-10-CM | POA: Diagnosis not present

## 2011-10-30 DIAGNOSIS — M25569 Pain in unspecified knee: Secondary | ICD-10-CM | POA: Diagnosis not present

## 2011-11-06 DIAGNOSIS — M1710 Unilateral primary osteoarthritis, unspecified knee: Secondary | ICD-10-CM | POA: Diagnosis not present

## 2011-11-07 ENCOUNTER — Other Ambulatory Visit: Payer: Self-pay | Admitting: Oncology

## 2011-11-13 ENCOUNTER — Ambulatory Visit: Payer: Medicare Other

## 2011-11-13 ENCOUNTER — Other Ambulatory Visit: Payer: Medicare Other | Admitting: Lab

## 2011-11-13 ENCOUNTER — Other Ambulatory Visit (HOSPITAL_BASED_OUTPATIENT_CLINIC_OR_DEPARTMENT_OTHER): Payer: Medicare Other | Admitting: Lab

## 2011-11-13 DIAGNOSIS — E559 Vitamin D deficiency, unspecified: Secondary | ICD-10-CM | POA: Diagnosis not present

## 2011-11-13 DIAGNOSIS — D649 Anemia, unspecified: Secondary | ICD-10-CM

## 2011-11-13 DIAGNOSIS — C50919 Malignant neoplasm of unspecified site of unspecified female breast: Secondary | ICD-10-CM | POA: Diagnosis not present

## 2011-11-13 LAB — CBC WITH DIFFERENTIAL/PLATELET
Basophils Absolute: 0.1 10*3/uL (ref 0.0–0.1)
EOS%: 0.8 % (ref 0.0–7.0)
HGB: 11 g/dL — ABNORMAL LOW (ref 11.6–15.9)
MCH: 24.3 pg — ABNORMAL LOW (ref 25.1–34.0)
MCV: 77.6 fL — ABNORMAL LOW (ref 79.5–101.0)
MONO%: 7.6 % (ref 0.0–14.0)
NEUT#: 7.3 10*3/uL — ABNORMAL HIGH (ref 1.5–6.5)
RBC: 4.52 10*6/uL (ref 3.70–5.45)
RDW: 16.1 % — ABNORMAL HIGH (ref 11.2–14.5)
lymph#: 1.4 10*3/uL (ref 0.9–3.3)

## 2011-11-13 MED ORDER — DARBEPOETIN ALFA-POLYSORBATE 300 MCG/0.6ML IJ SOLN
300.0000 ug | Freq: Once | INTRAMUSCULAR | Status: DC
  Filled 2011-11-13: qty 0.6

## 2011-11-16 DIAGNOSIS — E039 Hypothyroidism, unspecified: Secondary | ICD-10-CM | POA: Diagnosis not present

## 2011-11-16 DIAGNOSIS — E785 Hyperlipidemia, unspecified: Secondary | ICD-10-CM | POA: Diagnosis not present

## 2011-11-16 DIAGNOSIS — R634 Abnormal weight loss: Secondary | ICD-10-CM | POA: Diagnosis not present

## 2011-11-16 DIAGNOSIS — E119 Type 2 diabetes mellitus without complications: Secondary | ICD-10-CM | POA: Diagnosis not present

## 2011-11-16 DIAGNOSIS — Z23 Encounter for immunization: Secondary | ICD-10-CM | POA: Diagnosis not present

## 2011-11-16 DIAGNOSIS — I10 Essential (primary) hypertension: Secondary | ICD-10-CM | POA: Diagnosis not present

## 2011-12-08 DIAGNOSIS — E039 Hypothyroidism, unspecified: Secondary | ICD-10-CM | POA: Diagnosis not present

## 2011-12-08 DIAGNOSIS — E119 Type 2 diabetes mellitus without complications: Secondary | ICD-10-CM | POA: Diagnosis not present

## 2011-12-08 DIAGNOSIS — I10 Essential (primary) hypertension: Secondary | ICD-10-CM | POA: Diagnosis not present

## 2011-12-11 ENCOUNTER — Other Ambulatory Visit: Payer: Medicare Other | Admitting: Lab

## 2011-12-11 ENCOUNTER — Ambulatory Visit: Payer: Medicare Other

## 2011-12-16 ENCOUNTER — Other Ambulatory Visit (HOSPITAL_BASED_OUTPATIENT_CLINIC_OR_DEPARTMENT_OTHER): Payer: Medicare Other | Admitting: Lab

## 2011-12-16 ENCOUNTER — Ambulatory Visit (HOSPITAL_BASED_OUTPATIENT_CLINIC_OR_DEPARTMENT_OTHER): Payer: Medicare Other

## 2011-12-16 VITALS — BP 114/67 | HR 54 | Temp 97.6°F

## 2011-12-16 DIAGNOSIS — D649 Anemia, unspecified: Secondary | ICD-10-CM

## 2011-12-16 LAB — CBC WITH DIFFERENTIAL/PLATELET
Basophils Absolute: 0 10*3/uL (ref 0.0–0.1)
Eosinophils Absolute: 0.1 10*3/uL (ref 0.0–0.5)
HGB: 9.3 g/dL — ABNORMAL LOW (ref 11.6–15.9)
MCV: 75.5 fL — ABNORMAL LOW (ref 79.5–101.0)
MONO#: 0.6 10*3/uL (ref 0.1–0.9)
NEUT#: 5.1 10*3/uL (ref 1.5–6.5)
RBC: 3.96 10*6/uL (ref 3.70–5.45)
RDW: 15.9 % — ABNORMAL HIGH (ref 11.2–14.5)
WBC: 7.5 10*3/uL (ref 3.9–10.3)
lymph#: 1.7 10*3/uL (ref 0.9–3.3)

## 2011-12-16 MED ORDER — DARBEPOETIN ALFA-POLYSORBATE 300 MCG/0.6ML IJ SOLN
300.0000 ug | Freq: Once | INTRAMUSCULAR | Status: AC
  Administered 2011-12-16: 300 ug via SUBCUTANEOUS
  Filled 2011-12-16: qty 0.6

## 2011-12-24 DIAGNOSIS — M79609 Pain in unspecified limb: Secondary | ICD-10-CM | POA: Diagnosis not present

## 2011-12-24 DIAGNOSIS — B351 Tinea unguium: Secondary | ICD-10-CM | POA: Diagnosis not present

## 2012-01-15 ENCOUNTER — Other Ambulatory Visit (HOSPITAL_BASED_OUTPATIENT_CLINIC_OR_DEPARTMENT_OTHER): Payer: Medicare Other | Admitting: Lab

## 2012-01-15 ENCOUNTER — Ambulatory Visit (HOSPITAL_BASED_OUTPATIENT_CLINIC_OR_DEPARTMENT_OTHER): Payer: Medicare Other

## 2012-01-15 VITALS — BP 137/73 | HR 56 | Temp 97.7°F

## 2012-01-15 DIAGNOSIS — D649 Anemia, unspecified: Secondary | ICD-10-CM | POA: Diagnosis not present

## 2012-01-15 DIAGNOSIS — C50919 Malignant neoplasm of unspecified site of unspecified female breast: Secondary | ICD-10-CM

## 2012-01-15 LAB — CBC WITH DIFFERENTIAL/PLATELET
BASO%: 0.7 % (ref 0.0–2.0)
Eosinophils Absolute: 0.1 10*3/uL (ref 0.0–0.5)
MCHC: 31.6 g/dL (ref 31.5–36.0)
MCV: 78.5 fL — ABNORMAL LOW (ref 79.5–101.0)
MONO#: 0.6 10*3/uL (ref 0.1–0.9)
MONO%: 8.9 % (ref 0.0–14.0)
NEUT#: 4.5 10*3/uL (ref 1.5–6.5)
RBC: 4.2 10*6/uL (ref 3.70–5.45)
RDW: 15.6 % — ABNORMAL HIGH (ref 11.2–14.5)
WBC: 6.8 10*3/uL (ref 3.9–10.3)

## 2012-01-15 MED ORDER — DARBEPOETIN ALFA-POLYSORBATE 300 MCG/0.6ML IJ SOLN
300.0000 ug | Freq: Once | INTRAMUSCULAR | Status: AC
  Administered 2012-01-15: 300 ug via SUBCUTANEOUS
  Filled 2012-01-15: qty 0.6

## 2012-02-03 ENCOUNTER — Encounter: Payer: Self-pay | Admitting: *Deleted

## 2012-02-03 NOTE — Progress Notes (Unsigned)
CSW arranged transportation for patient appointments on 1/24, 2/28, and 3/28 through [employer] Patient notified.  Kathrin Penner, MSW, LCSW Clinical Social Worker [facility] [phone]

## 2012-02-10 ENCOUNTER — Telehealth: Payer: Self-pay | Admitting: *Deleted

## 2012-02-10 ENCOUNTER — Encounter: Payer: Self-pay | Admitting: *Deleted

## 2012-02-10 NOTE — Telephone Encounter (Signed)
Patient reports a 2-3 day history of pain in left chest "where I had my surgery". Only hurts when she moves in certain directions. No related dyspnea or lump or swelling is seen. Asking for someone to look at it when she is here on 02/12/12 for her injection appointment. Does admit she has done some mild exercise recently. Nurse told her it is probably muscular pain from muscle strain. She still requests a physician be notified and someone to check her breast on Friday. Previous patient of Dr. Nelda Bucks yet reassigned. Will forward to Norina Buzzard, NP to triage.

## 2012-02-11 ENCOUNTER — Encounter: Payer: Self-pay | Admitting: *Deleted

## 2012-02-11 NOTE — Progress Notes (Signed)
Per Jule Ser, office manager patient being followed now for anemia diagnosis. Will be assigned to Dr. Cyndie Chime with an appointment in March.  Will make patient aware when she is here Friday for her injection. Can also assess if her breast/chest pain has improved-sounds musculoskeletal in nature.

## 2012-02-11 NOTE — Progress Notes (Unsigned)
Per Jule Ser, office manager

## 2012-02-12 ENCOUNTER — Ambulatory Visit: Payer: Medicare Other

## 2012-02-12 ENCOUNTER — Encounter: Payer: Self-pay | Admitting: *Deleted

## 2012-02-12 ENCOUNTER — Other Ambulatory Visit (HOSPITAL_BASED_OUTPATIENT_CLINIC_OR_DEPARTMENT_OTHER): Payer: Medicare Other | Admitting: Lab

## 2012-02-12 DIAGNOSIS — D649 Anemia, unspecified: Secondary | ICD-10-CM

## 2012-02-12 LAB — CBC WITH DIFFERENTIAL/PLATELET
BASO%: 0.4 % (ref 0.0–2.0)
Eosinophils Absolute: 0.1 10*3/uL (ref 0.0–0.5)
LYMPH%: 23.7 % (ref 14.0–49.7)
MCHC: 31.8 g/dL (ref 31.5–36.0)
MONO#: 0.6 10*3/uL (ref 0.1–0.9)
NEUT#: 4.5 10*3/uL (ref 1.5–6.5)
Platelets: 197 10*3/uL (ref 145–400)
RBC: 4.48 10*6/uL (ref 3.70–5.45)
WBC: 6.8 10*3/uL (ref 3.9–10.3)
lymph#: 1.6 10*3/uL (ref 0.9–3.3)

## 2012-02-12 MED ORDER — DARBEPOETIN ALFA-POLYSORBATE 300 MCG/0.6ML IJ SOLN: 300.0000 ug | Freq: Once | INTRAMUSCULAR | Status: DC

## 2012-02-12 NOTE — Progress Notes (Signed)
[name] here for injection appointment.  She had called yesterday to complain about some pain in left breast area.  States that is comes after she has done some arm exercises during the night when she can't sleep.  Has an old mastectomy incision from years ago.  There is no redness or swelling in this area.  She feels that it is just musculoskeletal in nature.  Just wanted some reassurance that there is nothing there.  She can move her arms well without restriction or pain.  She will let us know if anything else develops.

## 2012-02-20 ENCOUNTER — Telehealth: Payer: Self-pay | Admitting: Oncology

## 2012-02-20 NOTE — Telephone Encounter (Signed)
Former PR pt reassigned to LL. Pt to see LL 4/25. Received a request from our social worker Lauren to move pt's appts to PM and she would contact pt (communication). All appts have been moved to PM including 4/25 w/LL. lmonvm for Lauren letting her know pt's appts have been moved to PM and also re to make pt aware of provider change. Letter not sent yet so pt will not be confused. After Leotis Shames has talked with pt I will mailed letter.

## 2012-02-22 DIAGNOSIS — L299 Pruritus, unspecified: Secondary | ICD-10-CM | POA: Diagnosis not present

## 2012-02-22 DIAGNOSIS — I10 Essential (primary) hypertension: Secondary | ICD-10-CM | POA: Diagnosis not present

## 2012-02-22 DIAGNOSIS — E039 Hypothyroidism, unspecified: Secondary | ICD-10-CM | POA: Diagnosis not present

## 2012-02-22 DIAGNOSIS — E119 Type 2 diabetes mellitus without complications: Secondary | ICD-10-CM | POA: Diagnosis not present

## 2012-02-26 DIAGNOSIS — R21 Rash and other nonspecific skin eruption: Secondary | ICD-10-CM | POA: Diagnosis not present

## 2012-02-26 DIAGNOSIS — IMO0002 Reserved for concepts with insufficient information to code with codable children: Secondary | ICD-10-CM | POA: Diagnosis not present

## 2012-02-26 DIAGNOSIS — M1710 Unilateral primary osteoarthritis, unspecified knee: Secondary | ICD-10-CM | POA: Diagnosis not present

## 2012-02-26 DIAGNOSIS — Z853 Personal history of malignant neoplasm of breast: Secondary | ICD-10-CM | POA: Diagnosis not present

## 2012-02-26 DIAGNOSIS — I10 Essential (primary) hypertension: Secondary | ICD-10-CM | POA: Diagnosis not present

## 2012-02-26 DIAGNOSIS — Z8673 Personal history of transient ischemic attack (TIA), and cerebral infarction without residual deficits: Secondary | ICD-10-CM | POA: Diagnosis not present

## 2012-03-06 DIAGNOSIS — I10 Essential (primary) hypertension: Secondary | ICD-10-CM | POA: Diagnosis not present

## 2012-03-06 DIAGNOSIS — D493 Neoplasm of unspecified behavior of breast: Secondary | ICD-10-CM | POA: Diagnosis not present

## 2012-03-06 DIAGNOSIS — M1710 Unilateral primary osteoarthritis, unspecified knee: Secondary | ICD-10-CM | POA: Diagnosis not present

## 2012-03-06 DIAGNOSIS — R269 Unspecified abnormalities of gait and mobility: Secondary | ICD-10-CM | POA: Diagnosis not present

## 2012-03-06 DIAGNOSIS — IMO0001 Reserved for inherently not codable concepts without codable children: Secondary | ICD-10-CM | POA: Diagnosis not present

## 2012-03-06 DIAGNOSIS — M25569 Pain in unspecified knee: Secondary | ICD-10-CM | POA: Diagnosis not present

## 2012-03-11 ENCOUNTER — Other Ambulatory Visit: Payer: Self-pay | Admitting: Oncology

## 2012-03-17 ENCOUNTER — Other Ambulatory Visit: Payer: Self-pay | Admitting: Oncology

## 2012-03-17 DIAGNOSIS — B351 Tinea unguium: Secondary | ICD-10-CM | POA: Diagnosis not present

## 2012-03-17 DIAGNOSIS — D649 Anemia, unspecified: Secondary | ICD-10-CM

## 2012-03-17 DIAGNOSIS — M79609 Pain in unspecified limb: Secondary | ICD-10-CM | POA: Diagnosis not present

## 2012-03-18 ENCOUNTER — Other Ambulatory Visit (HOSPITAL_BASED_OUTPATIENT_CLINIC_OR_DEPARTMENT_OTHER): Payer: Medicare Other | Admitting: Lab

## 2012-03-18 ENCOUNTER — Other Ambulatory Visit: Payer: Self-pay | Admitting: Oncology

## 2012-03-18 ENCOUNTER — Other Ambulatory Visit: Payer: Medicare Other | Admitting: Lab

## 2012-03-18 ENCOUNTER — Ambulatory Visit: Payer: Medicare Other

## 2012-03-18 ENCOUNTER — Ambulatory Visit (HOSPITAL_BASED_OUTPATIENT_CLINIC_OR_DEPARTMENT_OTHER): Payer: Medicare Other

## 2012-03-18 VITALS — BP 143/53 | HR 57 | Temp 98.4°F

## 2012-03-18 DIAGNOSIS — D649 Anemia, unspecified: Secondary | ICD-10-CM

## 2012-03-18 DIAGNOSIS — C50919 Malignant neoplasm of unspecified site of unspecified female breast: Secondary | ICD-10-CM

## 2012-03-18 LAB — CBC WITH DIFFERENTIAL/PLATELET
BASO%: 0.6 % (ref 0.0–2.0)
Basophils Absolute: 0.1 10*3/uL (ref 0.0–0.1)
Eosinophils Absolute: 0.2 10*3/uL (ref 0.0–0.5)
HCT: 32 % — ABNORMAL LOW (ref 34.8–46.6)
HGB: 10.2 g/dL — ABNORMAL LOW (ref 11.6–15.9)
LYMPH%: 19.5 % (ref 14.0–49.7)
MCHC: 31.8 g/dL (ref 31.5–36.0)
MONO#: 0.6 10*3/uL (ref 0.1–0.9)
NEUT#: 6 10*3/uL (ref 1.5–6.5)
NEUT%: 70.8 % (ref 38.4–76.8)
Platelets: 204 10*3/uL (ref <2.0)
WBC: 8.5 10*3/uL (ref 3.9–10.3)
lymph#: 1.7 10*3/uL (ref 0.9–3.3)

## 2012-03-18 MED ORDER — DARBEPOETIN ALFA-POLYSORBATE 300 MCG/0.6ML IJ SOLN
300.0000 ug | Freq: Once | INTRAMUSCULAR | Status: AC
  Administered 2012-03-18: 300 ug via SUBCUTANEOUS
  Filled 2012-03-18: qty 0.6

## 2012-04-07 DIAGNOSIS — M1710 Unilateral primary osteoarthritis, unspecified knee: Secondary | ICD-10-CM | POA: Diagnosis not present

## 2012-04-15 ENCOUNTER — Ambulatory Visit (HOSPITAL_BASED_OUTPATIENT_CLINIC_OR_DEPARTMENT_OTHER): Payer: Medicare Other

## 2012-04-15 ENCOUNTER — Other Ambulatory Visit (HOSPITAL_BASED_OUTPATIENT_CLINIC_OR_DEPARTMENT_OTHER): Payer: Medicare Other | Admitting: Lab

## 2012-04-15 ENCOUNTER — Ambulatory Visit: Payer: Medicare Other

## 2012-04-15 ENCOUNTER — Other Ambulatory Visit: Payer: Medicare Other | Admitting: Lab

## 2012-04-15 VITALS — BP 106/48 | HR 56 | Temp 98.3°F

## 2012-04-15 DIAGNOSIS — D649 Anemia, unspecified: Secondary | ICD-10-CM

## 2012-04-15 LAB — COMPREHENSIVE METABOLIC PANEL (CC13)
ALT: 10 U/L (ref 0–55)
AST: 11 U/L (ref 5–34)
Albumin: 2.8 g/dL — ABNORMAL LOW (ref 3.5–5.0)
Calcium: 8.8 mg/dL (ref 8.4–10.4)
Glucose: 120 mg/dl — ABNORMAL HIGH (ref 70–99)
Sodium: 140 mEq/L (ref 136–145)
Total Protein: 6 g/dL — ABNORMAL LOW (ref 6.4–8.3)

## 2012-04-15 LAB — CBC WITH DIFFERENTIAL/PLATELET
Basophils Absolute: 0 10*3/uL (ref 0.0–0.1)
Eosinophils Absolute: 0.2 10*3/uL (ref 0.0–0.5)
LYMPH%: 22.1 % (ref 14.0–49.7)
MCV: 76 fL — ABNORMAL LOW (ref 79.5–101.0)
MONO%: 7.6 % (ref 0.0–14.0)
NEUT#: 5.1 10*3/uL (ref 1.5–6.5)
NEUT%: 68 % (ref 38.4–76.8)
Platelets: 225 10*3/uL (ref 145–400)
RBC: 4 10*6/uL (ref 3.70–5.45)

## 2012-04-15 MED ORDER — DARBEPOETIN ALFA-POLYSORBATE 300 MCG/0.6ML IJ SOLN
300.0000 ug | Freq: Once | INTRAMUSCULAR | Status: AC
  Administered 2012-04-15: 300 ug via SUBCUTANEOUS
  Filled 2012-04-15: qty 0.6

## 2012-05-05 DIAGNOSIS — I10 Essential (primary) hypertension: Secondary | ICD-10-CM | POA: Diagnosis not present

## 2012-05-05 DIAGNOSIS — Z794 Long term (current) use of insulin: Secondary | ICD-10-CM | POA: Diagnosis not present

## 2012-05-05 DIAGNOSIS — Z602 Problems related to living alone: Secondary | ICD-10-CM | POA: Diagnosis not present

## 2012-05-05 DIAGNOSIS — R32 Unspecified urinary incontinence: Secondary | ICD-10-CM | POA: Diagnosis not present

## 2012-05-05 DIAGNOSIS — M25569 Pain in unspecified knee: Secondary | ICD-10-CM | POA: Diagnosis not present

## 2012-05-05 DIAGNOSIS — R269 Unspecified abnormalities of gait and mobility: Secondary | ICD-10-CM | POA: Diagnosis not present

## 2012-05-05 DIAGNOSIS — M1710 Unilateral primary osteoarthritis, unspecified knee: Secondary | ICD-10-CM | POA: Diagnosis not present

## 2012-05-05 DIAGNOSIS — D493 Neoplasm of unspecified behavior of breast: Secondary | ICD-10-CM | POA: Diagnosis not present

## 2012-05-05 DIAGNOSIS — IMO0001 Reserved for inherently not codable concepts without codable children: Secondary | ICD-10-CM | POA: Diagnosis not present

## 2012-05-09 ENCOUNTER — Ambulatory Visit: Payer: Medicare Other | Admitting: Physician Assistant

## 2012-05-10 ENCOUNTER — Telehealth: Payer: Self-pay

## 2012-05-10 NOTE — Telephone Encounter (Signed)
Explained to [name] that she will see Dr. Precious Reel PA-C on Friday 05-13-12 instead of Dr. Darrold Span.  Time to be at the [facility] is the same 1330.  Dr. Darrold Span had to work in a new patient  at that time.  Ms. Carmon verbalized understanding.

## 2012-05-12 ENCOUNTER — Other Ambulatory Visit: Payer: Self-pay | Admitting: Oncology

## 2012-05-13 ENCOUNTER — Telehealth: Payer: Self-pay | Admitting: Oncology

## 2012-05-13 ENCOUNTER — Other Ambulatory Visit (HOSPITAL_BASED_OUTPATIENT_CLINIC_OR_DEPARTMENT_OTHER): Payer: Medicare Other | Admitting: Lab

## 2012-05-13 ENCOUNTER — Encounter: Payer: Self-pay | Admitting: Oncology

## 2012-05-13 ENCOUNTER — Ambulatory Visit (HOSPITAL_BASED_OUTPATIENT_CLINIC_OR_DEPARTMENT_OTHER): Payer: Medicare Other | Admitting: Oncology

## 2012-05-13 ENCOUNTER — Ambulatory Visit: Payer: Medicare Other | Admitting: Oncology

## 2012-05-13 ENCOUNTER — Ambulatory Visit: Payer: Medicare Other | Admitting: Physician Assistant

## 2012-05-13 ENCOUNTER — Other Ambulatory Visit: Payer: Medicare Other | Admitting: Lab

## 2012-05-13 ENCOUNTER — Ambulatory Visit: Payer: Medicare Other

## 2012-05-13 VITALS — BP 158/77 | HR 53 | Temp 97.6°F | Resp 20 | Ht 66.0 in | Wt 203.0 lb

## 2012-05-13 DIAGNOSIS — N189 Chronic kidney disease, unspecified: Secondary | ICD-10-CM | POA: Diagnosis not present

## 2012-05-13 DIAGNOSIS — M8580 Other specified disorders of bone density and structure, unspecified site: Secondary | ICD-10-CM

## 2012-05-13 DIAGNOSIS — C50919 Malignant neoplasm of unspecified site of unspecified female breast: Secondary | ICD-10-CM

## 2012-05-13 DIAGNOSIS — D649 Anemia, unspecified: Secondary | ICD-10-CM

## 2012-05-13 DIAGNOSIS — D631 Anemia in chronic kidney disease: Secondary | ICD-10-CM | POA: Diagnosis not present

## 2012-05-13 DIAGNOSIS — N039 Chronic nephritic syndrome with unspecified morphologic changes: Secondary | ICD-10-CM

## 2012-05-13 DIAGNOSIS — M949 Disorder of cartilage, unspecified: Secondary | ICD-10-CM

## 2012-05-13 DIAGNOSIS — C50911 Malignant neoplasm of unspecified site of right female breast: Secondary | ICD-10-CM

## 2012-05-13 DIAGNOSIS — M899 Disorder of bone, unspecified: Secondary | ICD-10-CM | POA: Diagnosis not present

## 2012-05-13 LAB — CBC WITH DIFFERENTIAL/PLATELET
BASO%: 0.6 % (ref 0.0–2.0)
EOS%: 2 % (ref 0.0–7.0)
MCH: 23.8 pg — ABNORMAL LOW (ref 25.1–34.0)
MCHC: 30.8 g/dL — ABNORMAL LOW (ref 31.5–36.0)
NEUT%: 60.8 % (ref 38.4–76.8)
RBC: 4.61 10*6/uL (ref 3.70–5.45)
RDW: 15.3 % — ABNORMAL HIGH (ref 11.2–14.5)
WBC: 7 10*3/uL (ref 3.9–10.3)
lymph#: 1.9 10*3/uL (ref 0.9–3.3)

## 2012-05-13 NOTE — Progress Notes (Signed)
OFFICE PROGRESS NOTE   05/13/2012   Physicians: Cain Saupe (PCP), Drinda Butts, H.Derrell Lolling  INTERVAL HISTORY:   Patient is a 79 yo lady whom I am meeting today for the first time, transferring care to me for breast cancer as Dr Pierce Crane is no longer at this office. She is unaccompanied for visit today (transportation to office arranged by [facility] [facility] [employer]) and is unable to give history, such that this information is from EMR records. RN did speak with daughter after visit to review medications and was given Drs Jillyn Hidden and [employer] names as involved physicians. Patient continues Femara, begun ~ June 2010. Last bone density scan was at [facility] June 2012 with osteopenia; last right mammogram at [facility] 07-13-2011. She also receives aranesp ~ monthly for anemia related to chronic kidney disease and other chronic disease. 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 This was intermediate grade, ER positive 100%, PR + 70%, HER 2 negative. She had mastectomy with sentinel node evaluation, with 2 separate lesions, one 1.7 cm and the other 0.7 cm, margins clear and 2 sentinel nodes involved + one additional node negative. Patient declined radiation but has been on Femara since ~ June 2010. Dr Donnie Coffin has seen her ~ every 6 months. In this EMR she has reports of CXR in 2010 and CT head in 03-2010 (refused staging studies at diagnosis).   Patient seems to tolerate the Femara without difficulty. She denies pain at left mastectomy area or LUE and has no concerns about right breast. She tells me that she notices that she is more weak prior to aranesp injections. She reports good appetite, no breathing problems, no illnesses this past winter. She has chronic knee pain, L worse than R, better with shots by ? which MD. She has no new or different pain. She has dentures. Bowels are moving as usual. She denies bleeding. Her  vision is very poor, not new. Remainder of 10 point Review of Systems negative as best I can get history.  Social History: lives alone in [place], with daughter and sister local. 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 Married x 40 years, widowed ~ 2007. Four grandchildren. Nonsmoker. No ETOH  Past Medical/ Surgical History G2P2 Chronic tremor Diabetes on insulin since 1985 Radioactive iodine treatment of thyroid 1970s Degenerative arthritis Borderline HTN Patient does not seem to know if vision problems are from DM  Family History 4 brothers and 2 sisters died with MIs Son died of MI age 26 No cancer known in family otherwise  Objective:  Vital signs in last 24 hours:  BP 158/77  Pulse 53  Temp(Src) 97.6 F (36.4 C) (Oral)  Resp 20  Ht 5\' 6"  (1.676 m)  Wt 203 lb (92.08 kg)  BMI 32.78 kg/m2  Weight Oct 2013 recorded 211. In WC, pleasant, deaf, does not appear in any acute discomfort.  HEENT:not icteric. Unable to read list of medicines with print 3-4 inches high in dark marker. Dentures in, oral mucosa moist, some chelosis at corners of lips.  LymphaticsCervical, supraclavicular, and axillary nodes normal. Resp: clear to auscultation bilaterally Cardio: regular rate and rhythm no gallop GI: soft, non-tender; bowel sounds normal; no masses,  no organomegaly Extremities: extremities normal, atraumatic, no cyanosis or edema Neuro: poor visual acuity and seems somewhat deaf, otherwise nothing focal on exam done just in WC. Resting  tremor including hands and head. Breast right: no dominant mass, no skin or nipple findings. Nothing in right axilla. Left mastectomy scar well healed, without evidence of local recurrence, left axilla benign, no swelling LUE.  Lab Results:  Results for orders placed in visit on 05/13/12  CBC WITH DIFFERENTIAL      Result Value Range   WBC 7.0  3.9 - 10.3 10e3/uL   NEUT# 4.3   1.5 - 6.5 10e3/uL   HGB 11.0 (*) 11.6 - 15.9 g/dL   HCT 40.9  81.1 - 91.4 %   Platelets 215  145 - 400 10e3/uL   MCV 77.3 (*) 79.5 - 101.0 fL   MCH 23.8 (*) 25.1 - 34.0 pg   MCHC 30.8 (*) 31.5 - 36.0 g/dL   RBC 7.82  9.56 - 2.13 10e6/uL   RDW 15.3 (*) 11.2 - 14.5 %   lymph# 1.9  0.9 - 3.3 10e3/uL   MONO# 0.7  0.1 - 0.9 10e3/uL   Eosinophils Absolute 0.1  0.0 - 0.5 10e3/uL   Basophils Absolute 0.0  0.0 - 0.1 10e3/uL   NEUT% 60.8  38.4 - 76.8 %   LYMPH% 26.6  14.0 - 49.7 %   MONO% 10.0  0.0 - 14.0 %   EOS% 2.0  0.0 - 7.0 %   BASO% 0.6  0.0 - 2.0 %    CMET from 04-15-12 normal with exception of glucose 120, BUN 38, creatinine stable at 1.2, total protein 6.0, alb 2.8, LFTs normal, calcium 8.8.  Studies/Results: Note path did not carry over into this EMR and I will try to locate this from previous EMR/ other to be scanned for future reference.  Medications: I have reviewed the patient's current medications, which RN reviewed by phone after visit with daughter.   Assessment/Plan:  1. Left breast cancer T1N1 ER/PR +, HER 2 negative, 2 sentinel nodes involved: out almost 4 years from diagnosis, post mastectomy with 3 node axillary evaluation and continuing Femara. She will need right mammogram in late June at [facility], and should have bone density also then. I will see her back in ~ 4 months, or sooner if needed. 2.osteopenia, on high risk aromatase inhibitor: bone density scan as above. With breast cancer node +, benefit of aromatase inhibitor likely greater than risk unless very dramatic drop in bone density on upcoming scan. 3.anemia secondary to chronic kidney disease and chronic disease: does not need aranesp today with Hgb 11. She generally needs the aranesp ~ 3/4 months, by CBC each. 4.degenerative arthritis 5.poor visual acuity 6.chronic tremor 7.diabetes on insulin 8.strong FH heart disease  Patient is very appreciative of transportation arrangements by Child psychotherapist and is  relieved that I find nothing of concern at mastectomy site. Time spent 40 min.  Laira Penninger P, MD   05/13/2012, 8:20 PM

## 2012-05-13 NOTE — Patient Instructions (Signed)
You do not need a shot today. We will check on your blood counts next month and give shot then if you need it.

## 2012-05-13 NOTE — Telephone Encounter (Signed)
gv pt appt schedule for May thru August including mammo/bone density @ [facility].

## 2012-05-13 NOTE — Progress Notes (Signed)
Shirah here for lab and possible injection.  Aranesp to bi held in HGB > 10.9.  HGB 11.0 today.  Injection held.

## 2012-05-23 DIAGNOSIS — E1165 Type 2 diabetes mellitus with hyperglycemia: Secondary | ICD-10-CM | POA: Diagnosis not present

## 2012-05-23 DIAGNOSIS — N189 Chronic kidney disease, unspecified: Secondary | ICD-10-CM | POA: Diagnosis not present

## 2012-05-23 DIAGNOSIS — E890 Postprocedural hypothyroidism: Secondary | ICD-10-CM | POA: Diagnosis not present

## 2012-05-23 DIAGNOSIS — I10 Essential (primary) hypertension: Secondary | ICD-10-CM | POA: Diagnosis not present

## 2012-05-23 DIAGNOSIS — E119 Type 2 diabetes mellitus without complications: Secondary | ICD-10-CM | POA: Diagnosis not present

## 2012-05-23 DIAGNOSIS — E039 Hypothyroidism, unspecified: Secondary | ICD-10-CM | POA: Diagnosis not present

## 2012-05-24 DIAGNOSIS — B351 Tinea unguium: Secondary | ICD-10-CM | POA: Diagnosis not present

## 2012-05-24 DIAGNOSIS — M79609 Pain in unspecified limb: Secondary | ICD-10-CM | POA: Diagnosis not present

## 2012-06-10 ENCOUNTER — Other Ambulatory Visit: Payer: Self-pay

## 2012-06-10 ENCOUNTER — Ambulatory Visit: Payer: Medicare Other

## 2012-06-10 ENCOUNTER — Ambulatory Visit (HOSPITAL_BASED_OUTPATIENT_CLINIC_OR_DEPARTMENT_OTHER): Payer: Medicare Other

## 2012-06-10 ENCOUNTER — Other Ambulatory Visit (HOSPITAL_BASED_OUTPATIENT_CLINIC_OR_DEPARTMENT_OTHER): Payer: Medicare Other

## 2012-06-10 ENCOUNTER — Other Ambulatory Visit: Payer: Medicare Other | Admitting: Lab

## 2012-06-10 VITALS — BP 118/47 | HR 57 | Temp 98.5°F

## 2012-06-10 DIAGNOSIS — D649 Anemia, unspecified: Secondary | ICD-10-CM

## 2012-06-10 LAB — CBC WITH DIFFERENTIAL/PLATELET
Basophils Absolute: 0.1 10*3/uL (ref 0.0–0.1)
EOS%: 2.2 % (ref 0.0–7.0)
Eosinophils Absolute: 0.1 10*3/uL (ref 0.0–0.5)
HCT: 31.1 % — ABNORMAL LOW (ref 34.8–46.6)
HGB: 9.8 g/dL — ABNORMAL LOW (ref 11.6–15.9)
MCH: 24 pg — ABNORMAL LOW (ref 25.1–34.0)
MCV: 76 fL — ABNORMAL LOW (ref 79.5–101.0)
MONO%: 8.7 % (ref 0.0–14.0)
NEUT#: 4.1 10*3/uL (ref 1.5–6.5)
NEUT%: 62.9 % (ref 38.4–76.8)
lymph#: 1.7 10*3/uL (ref 0.9–3.3)

## 2012-06-10 MED ORDER — DARBEPOETIN ALFA-POLYSORBATE 300 MCG/0.6ML IJ SOLN
300.0000 ug | Freq: Once | INTRAMUSCULAR | Status: AC
  Administered 2012-06-10: 300 ug via SUBCUTANEOUS
  Filled 2012-06-10: qty 0.6

## 2012-06-22 ENCOUNTER — Encounter: Payer: Self-pay | Admitting: *Deleted

## 2012-06-22 NOTE — Progress Notes (Signed)
Clinical Social Work set up patient's transportation for upcoming appointments (6/27, 7/25, 8/22) through [employer] CSW called patient to confirm.  Patient verbalized understanding and plans to contact CSW with any questions or concerns.  Kathrin Penner, MSW, LCSW Clinical Social Worker [facility] [phone]

## 2012-07-12 DIAGNOSIS — M1710 Unilateral primary osteoarthritis, unspecified knee: Secondary | ICD-10-CM | POA: Diagnosis not present

## 2012-07-14 ENCOUNTER — Ambulatory Visit
Admission: RE | Admit: 2012-07-14 | Discharge: 2012-07-14 | Disposition: A | Discharge status: Home / Self Care | Payer: Medicare Other | Source: Ambulatory Visit | Attending: Oncology | Admitting: Oncology

## 2012-07-14 DIAGNOSIS — C50911 Malignant neoplasm of unspecified site of right female breast: Secondary | ICD-10-CM

## 2012-07-14 DIAGNOSIS — Z853 Personal history of malignant neoplasm of breast: Secondary | ICD-10-CM | POA: Diagnosis not present

## 2012-07-14 DIAGNOSIS — Z1231 Encounter for screening mammogram for malignant neoplasm of breast: Secondary | ICD-10-CM | POA: Diagnosis not present

## 2012-07-14 DIAGNOSIS — M949 Disorder of cartilage, unspecified: Secondary | ICD-10-CM | POA: Diagnosis not present

## 2012-07-14 DIAGNOSIS — M8580 Other specified disorders of bone density and structure, unspecified site: Secondary | ICD-10-CM

## 2012-07-15 ENCOUNTER — Other Ambulatory Visit: Payer: Medicare Other | Admitting: Lab

## 2012-07-15 ENCOUNTER — Ambulatory Visit (HOSPITAL_BASED_OUTPATIENT_CLINIC_OR_DEPARTMENT_OTHER): Payer: Medicare Other

## 2012-07-15 ENCOUNTER — Ambulatory Visit: Payer: Medicare Other

## 2012-07-15 ENCOUNTER — Other Ambulatory Visit (HOSPITAL_BASED_OUTPATIENT_CLINIC_OR_DEPARTMENT_OTHER): Payer: Medicare Other | Admitting: Lab

## 2012-07-15 VITALS — BP 118/61 | HR 58 | Temp 98.7°F

## 2012-07-15 DIAGNOSIS — D649 Anemia, unspecified: Secondary | ICD-10-CM | POA: Diagnosis not present

## 2012-07-15 DIAGNOSIS — N189 Chronic kidney disease, unspecified: Secondary | ICD-10-CM

## 2012-07-15 DIAGNOSIS — D631 Anemia in chronic kidney disease: Secondary | ICD-10-CM

## 2012-07-15 LAB — CBC WITH DIFFERENTIAL/PLATELET
Basophils Absolute: 0 10*3/uL (ref 0.0–0.1)
EOS%: 0.7 % (ref 0.0–7.0)
Eosinophils Absolute: 0.1 10*3/uL (ref 0.0–0.5)
HGB: 10.2 g/dL — ABNORMAL LOW (ref 11.6–15.9)
LYMPH%: 24.2 % (ref 14.0–49.7)
MCH: 23.9 pg — ABNORMAL LOW (ref 25.1–34.0)
MCV: 75.6 fL — ABNORMAL LOW (ref 79.5–101.0)
MONO%: 9.1 % (ref 0.0–14.0)
NEUT#: 5 10*3/uL (ref 1.5–6.5)
Platelets: 231 10*3/uL (ref 145–400)
RDW: 14.9 % — ABNORMAL HIGH (ref 11.2–14.5)

## 2012-07-15 MED ORDER — DARBEPOETIN ALFA-POLYSORBATE 300 MCG/0.6ML IJ SOLN
300.0000 ug | Freq: Once | INTRAMUSCULAR | Status: AC
  Administered 2012-07-15: 300 ug via SUBCUTANEOUS
  Filled 2012-07-15: qty 0.6

## 2012-07-28 DIAGNOSIS — M79609 Pain in unspecified limb: Secondary | ICD-10-CM | POA: Diagnosis not present

## 2012-07-28 DIAGNOSIS — B351 Tinea unguium: Secondary | ICD-10-CM | POA: Diagnosis not present

## 2012-08-05 ENCOUNTER — Other Ambulatory Visit: Payer: Self-pay | Admitting: Physician Assistant

## 2012-08-08 ENCOUNTER — Other Ambulatory Visit: Payer: Self-pay | Admitting: Oncology

## 2012-08-12 ENCOUNTER — Ambulatory Visit: Payer: Medicare Other

## 2012-08-12 ENCOUNTER — Other Ambulatory Visit (HOSPITAL_BASED_OUTPATIENT_CLINIC_OR_DEPARTMENT_OTHER): Payer: Medicare Other | Admitting: Lab

## 2012-08-12 ENCOUNTER — Other Ambulatory Visit: Payer: Medicare Other | Admitting: Lab

## 2012-08-12 ENCOUNTER — Ambulatory Visit (HOSPITAL_BASED_OUTPATIENT_CLINIC_OR_DEPARTMENT_OTHER): Payer: Medicare Other

## 2012-08-12 VITALS — BP 116/60 | HR 55 | Temp 98.4°F

## 2012-08-12 DIAGNOSIS — D649 Anemia, unspecified: Secondary | ICD-10-CM

## 2012-08-12 LAB — CBC WITH DIFFERENTIAL/PLATELET
BASO%: 0.8 % (ref 0.0–2.0)
Basophils Absolute: 0.1 10*3/uL (ref 0.0–0.1)
EOS%: 2.4 % (ref 0.0–7.0)
HCT: 32.7 % — ABNORMAL LOW (ref 34.8–46.6)
HGB: 10.3 g/dL — ABNORMAL LOW (ref 11.6–15.9)
LYMPH%: 30.6 % (ref 14.0–49.7)
MCH: 24 pg — ABNORMAL LOW (ref 25.1–34.0)
MCHC: 31.5 g/dL (ref 31.5–36.0)
MCV: 76.2 fL — ABNORMAL LOW (ref 79.5–101.0)
MONO%: 9.7 % (ref 0.0–14.0)
NEUT%: 56.5 % (ref 38.4–76.8)
Platelets: 230 10*3/uL (ref 145–400)
lymph#: 1.9 10*3/uL (ref 0.9–3.3)

## 2012-08-12 MED ORDER — DARBEPOETIN ALFA-POLYSORBATE 300 MCG/0.6ML IJ SOLN
300.0000 ug | Freq: Once | INTRAMUSCULAR | Status: AC
  Administered 2012-08-12: 300 ug via SUBCUTANEOUS
  Filled 2012-08-12: qty 0.6

## 2012-08-23 ENCOUNTER — Other Ambulatory Visit: Payer: Medicare Other

## 2012-08-25 ENCOUNTER — Other Ambulatory Visit: Payer: Self-pay | Admitting: *Deleted

## 2012-08-25 DIAGNOSIS — E876 Hypokalemia: Secondary | ICD-10-CM | POA: Insufficient documentation

## 2012-08-25 DIAGNOSIS — I10 Essential (primary) hypertension: Secondary | ICD-10-CM | POA: Insufficient documentation

## 2012-08-25 DIAGNOSIS — E039 Hypothyroidism, unspecified: Secondary | ICD-10-CM

## 2012-08-26 ENCOUNTER — Other Ambulatory Visit: Payer: Self-pay | Admitting: *Deleted

## 2012-08-26 DIAGNOSIS — I6789 Other cerebrovascular disease: Secondary | ICD-10-CM | POA: Diagnosis not present

## 2012-08-26 DIAGNOSIS — Z23 Encounter for immunization: Secondary | ICD-10-CM | POA: Diagnosis not present

## 2012-08-26 DIAGNOSIS — M1710 Unilateral primary osteoarthritis, unspecified knee: Secondary | ICD-10-CM | POA: Diagnosis not present

## 2012-08-26 DIAGNOSIS — R259 Unspecified abnormal involuntary movements: Secondary | ICD-10-CM | POA: Diagnosis not present

## 2012-08-26 DIAGNOSIS — I10 Essential (primary) hypertension: Secondary | ICD-10-CM | POA: Diagnosis not present

## 2012-08-26 DIAGNOSIS — IMO0002 Reserved for concepts with insufficient information to code with codable children: Secondary | ICD-10-CM | POA: Diagnosis not present

## 2012-08-29 ENCOUNTER — Other Ambulatory Visit (INDEPENDENT_AMBULATORY_CARE_PROVIDER_SITE_OTHER): Payer: Medicare Other

## 2012-08-29 ENCOUNTER — Encounter: Payer: Self-pay | Admitting: Endocrinology

## 2012-08-29 ENCOUNTER — Other Ambulatory Visit: Payer: Self-pay | Admitting: *Deleted

## 2012-08-29 ENCOUNTER — Ambulatory Visit (INDEPENDENT_AMBULATORY_CARE_PROVIDER_SITE_OTHER): Payer: Medicare Other | Admitting: Endocrinology

## 2012-08-29 ENCOUNTER — Other Ambulatory Visit: Payer: Medicare Other

## 2012-08-29 ENCOUNTER — Telehealth: Payer: Self-pay | Admitting: *Deleted

## 2012-08-29 VITALS — BP 124/68 | HR 65 | Temp 98.2°F | Resp 12

## 2012-08-29 DIAGNOSIS — I10 Essential (primary) hypertension: Secondary | ICD-10-CM | POA: Diagnosis not present

## 2012-08-29 DIAGNOSIS — E876 Hypokalemia: Secondary | ICD-10-CM | POA: Diagnosis not present

## 2012-08-29 DIAGNOSIS — IMO0001 Reserved for inherently not codable concepts without codable children: Secondary | ICD-10-CM

## 2012-08-29 DIAGNOSIS — E039 Hypothyroidism, unspecified: Secondary | ICD-10-CM | POA: Diagnosis not present

## 2012-08-29 LAB — URINALYSIS, ROUTINE W REFLEX MICROSCOPIC
Bilirubin Urine: NEGATIVE
Ketones, ur: NEGATIVE
Urine Glucose: NEGATIVE
Urobilinogen, UA: 0.2 (ref 0.0–1.0)

## 2012-08-29 LAB — COMPREHENSIVE METABOLIC PANEL
ALT: 12 U/L (ref 0–35)
AST: 15 U/L (ref 0–37)
Calcium: 8.9 mg/dL (ref 8.4–10.5)
Chloride: 103 mEq/L (ref 96–112)
Creatinine, Ser: 1.2 mg/dL (ref 0.4–1.2)
Potassium: 4.1 mEq/L (ref 3.5–5.1)
Sodium: 140 mEq/L (ref 135–145)
Total Protein: 6.2 g/dL (ref 6.0–8.3)

## 2012-08-29 LAB — MICROALBUMIN / CREATININE URINE RATIO: Microalb, Ur: 8.4 mg/dL — ABNORMAL HIGH (ref 0.0–1.9)

## 2012-08-29 LAB — LIPID PANEL
Cholesterol: 88 mg/dL (ref 0–200)
HDL: 42.5 mg/dL (ref >39.00)
VLDL: 7.4 mg/dL (ref 0.0–40.0)

## 2012-08-29 LAB — BASIC METABOLIC PANEL
BUN: 29 mg/dL — ABNORMAL HIGH (ref 6–23)
Chloride: 103 mEq/L (ref 96–112)
Creatinine, Ser: 1.2 mg/dL (ref 0.4–1.2)

## 2012-08-29 LAB — TSH: TSH: 0.47 u[IU]/mL (ref 0.35–5.50)

## 2012-08-29 NOTE — Patient Instructions (Signed)
REDUCE INSULIN FROM 28 DOWN TO 25 UNITS CALL IF SUGAR GETS LOW

## 2012-08-29 NOTE — Telephone Encounter (Signed)
Critical Glucose of 48

## 2012-08-29 NOTE — Progress Notes (Signed)
Patient ID: [name], female   DOB: 04/05/1930, 79 y.o.   MRN: [id number]  [name] is an 79 y.o. female.   Reason for Appointment: Diabetes follow-up   History of Present Illness   Diagnosis: Type 2 DIABETES MELITUS, long-standing  She has been on insulin for several years and most of the time has been on NPH or premixed  insulin for convenience and simplicity About a year ago because of hypoglycemia overnight her evening insulin was stopped Also for unclear reasons her insulin requirement has been much less over the last year and her doses have been reduced Also has had some weight loss Recently her blood sugars appear to be overall very well controlled with average reading 130 and only sporadic high readings However in the last 10 days she has had occasional late afternoon hypoglycemia  Insulin regimen: 28 units in the morning       She has not been on oral hypoglycemic drugs for several years      Proper timing of medications in relation to meals: Yes.         Monitors blood glucose: Once a day.    Glucometer: One Touch.          Blood Glucose readings from meter download: readings before breakfast: Median 129 with range 105-183, nonfasting 62-250 highest readings  after supper. Median after supper about 139 and lowest reading is 62 yesterday at 4 PM  Hypoglycemia frequency:  has had about 3 readings in the 60s. Symptoms of low sugar of feeling weak and woozy.          Meals:  2-3 meals per day.          The last HbgA1c was reported as 5.4   Wt Readings from Last 3 Encounters:  05/13/12 203 lb (92.08 kg)  10/29/11 211 lb 11.2 oz (96.026 kg)  05/01/11 233 lb 8 oz (105.915 kg)      Medication List       This list is accurate as of: 08/29/12  2:55 PM.  Always use your most recent med list.               B-complex with vitamin C tablet  Take 1 tablet by mouth daily.     diazepam 5 MG tablet  Commonly known as:  VALIUM  Take 5 mg by mouth every 6 (six) hours  as needed.     doxazosin 2 MG tablet  Commonly known as:  CARDURA  Take 2 mg by mouth at bedtime.     HUMULIN 70/30 (70-30) 100 UNIT/ML injection  Generic drug:  insulin NPH-regular     Hydrocodone-Acetaminophen 10-660 MG Tabs     indapamide 1.25 MG tablet  Commonly known as:  LOZOL  Take 1.25 mg by mouth every morning.     INSULIN LISPRO (HUMAN) [place]  Inject into the skin. 70/30     INSULIN SYRINGE 1CC/29G 29G X 1/2" 1 ML Misc  by Does not apply route.     irbesartan 150 MG tablet  Commonly known as:  AVAPRO     IRON PO  Take 65 mg by mouth daily.     KLOR-CON M20 20 MEQ tablet  Generic drug:  potassium chloride SA     letrozole 2.5 MG tablet  Commonly known as:  FEMARA  TAKE ONE TABLET BY MOUTH EVERY DAY     levothyroxine 150 MCG tablet  Commonly known as:  SYNTHROID, LEVOTHROID  Take 150 mcg by mouth daily.  metoprolol succinate 50 MG 24 hr tablet  Commonly known as:  TOPROL-XL     PERIDIN-C PO  Take by mouth.     rOPINIRole 3 MG tablet  Commonly known as:  REQUIP     Vitamin D 2000 UNITS tablet  Take 2,000 Units by mouth daily.        Allergies:  Allergies  Allergen Reactions  . Aspirin   . Insulins     NOVA?  Marland Kitchen Penicillins     Past Medical History  Diagnosis Date  . Hemorrhoid   . Anemia   . Arthritis     Past Surgical History  Procedure Laterality Date  . Breast lumpectomy      LEFT  . Abdominal hysterectomy      PARTIAL  . Mastectomy  05/2008    Left    No family history on file.  Social History:  reports that she has never smoked. She does not have any smokeless tobacco history on file. She reports that she does not drink alcohol. Her drug history is not on file.  Review of Systems:  HYPERTENSION: This is long-standing and has been well controlled     HYPOTHYROIDISM: She has had long-standing hypothyroidism and the dose has been stable over the last couple of visits  She is asking about treatment of her late  insomnia  She has had leg weakness and is in a wheelchair  Has a history of mild anemia  No history of hypercholesterolemia     Examination:   BP 124/68  Pulse 65  Temp(Src) 98.2 F (36.8 C)  Resp 12  SpO2 97%  There is no weight on file to calculate BMI.   Thyroid not palpable No lymphadenopathy no neck No pedal edema Heart sounds are normal She has a parkinsonian tremor  ASSESSMENT/ PLAN::   Diabetes type 2   The patient's diabetes control appears to be overall good especially considering her age and multiple medical problems Because of her tendency to afternoon hypoglycemia recently will reduce her dosage to 25 units in the morning  HYPERTENSION: Blood pressure is excellent She has had a recent history of renal dysfunction and will recheck her labs today  HYPOTHYROIDISM: Difficult to judge her symptoms and will check her TSH again  Advised her to discuss insomnia with PCP  [facility] 08/29/2012, 2:55 PM   Addendum: A1c excellent at 5.3, TSH normal  Office Visit on 08/29/2012  Component Date Value Range Status  . Hemoglobin A1C 08/29/2012 5.3  4.6 - 6.5 % Final   Glycemic Control Guidelines for People with Diabetes:Non Diabetic:  <6%Goal of Therapy: <7%Additional Action Suggested:  >8%   . Sodium 08/29/2012 140  135 - 145 mEq/L Final  . Potassium 08/29/2012 4.1  3.5 - 5.1 mEq/L Final  . Chloride 08/29/2012 103  96 - 112 mEq/L Final  . CO2 08/29/2012 27  19 - 32 mEq/L Final  . Glucose, Bld 08/29/2012 48* 70 - 99 mg/dL Final  . BUN 16/10/9602 29* 6 - 23 mg/dL Final  . Creatinine, Ser 08/29/2012 1.2  0.4 - 1.2 mg/dL Final  . Total Bilirubin 08/29/2012 0.6  0.3 - 1.2 mg/dL Final  . Alkaline Phosphatase 08/29/2012 80  39 - 117 U/L Final  . AST 08/29/2012 15  0 - 37 U/L Final  . ALT 08/29/2012 12  0 - 35 U/L Final  . Total Protein 08/29/2012 6.2  6.0 - 8.3 g/dL Final  . Albumin 54/09/8117 3.2* 3.5 - 5.2 g/dL Final  .  Calcium 08/29/2012 8.9  8.4 - 10.5 mg/dL Final   . GFR 47/82/9562 57.25* >60.00 mL/min Final  . Sodium 08/29/2012 140  135 - 145 mEq/L Final  . Potassium 08/29/2012 4.1  3.5 - 5.1 mEq/L Final  . Chloride 08/29/2012 103  96 - 112 mEq/L Final  . CO2 08/29/2012 27  19 - 32 mEq/L Final  . Glucose, Bld 08/29/2012 48* 70 - 99 mg/dL Final  . BUN 13/08/6576 29* 6 - 23 mg/dL Final  . Creatinine, Ser 08/29/2012 1.2  0.4 - 1.2 mg/dL Final  . Calcium 46/96/2952 8.9  8.4 - 10.5 mg/dL Final  . GFR 84/13/2440 57.25* >60.00 mL/min Final  . Microalb, Ur 08/29/2012 8.4* 0.0 - 1.9 mg/dL Final  . Creatinine,U 11/15/2534 139.4   Final  . Microalb Creat Ratio 08/29/2012 6.0  0.0 - 30.0 mg/g Final  . Free T4 08/29/2012 1.42  0.60 - 1.60 ng/dL Final  . TSH 64/40/3474 0.47  0.35 - 5.50 uIU/mL Final  . Color, Urine 08/29/2012 LT. YELLOW  Yellow;Lt. Yellow Final  . APPearance 08/29/2012 CLEAR  Clear Final  . Specific Gravity, Urine 08/29/2012 1.020  1.000-1.030 Final  . pH 08/29/2012 6.0  5.0 - 8.0 Final  . Total Protein, Urine 08/29/2012 NEGATIVE  Negative Final  . Urine Glucose 08/29/2012 NEGATIVE  Negative Final  . Ketones, ur 08/29/2012 NEGATIVE  Negative Final  . Bilirubin Urine 08/29/2012 NEGATIVE  Negative Final  . Hgb urine dipstick 08/29/2012 NEGATIVE  Negative Final  . Urobilinogen, UA 08/29/2012 0.2  0.0 - 1.0 Final  . Leukocytes, UA 08/29/2012 SMALL  Negative Final  . Nitrite 08/29/2012 POSITIVE  Negative Final  . WBC, UA 08/29/2012 3-6/hpf  0-2/hpf Final  . Squamous Epithelial / LPF 08/29/2012 Rare(0-4/hpf)  Rare(0-4/hpf) Final  . Bacteria, UA 08/29/2012 Few(10-50/hpf)  None Final  Appointment on 08/29/2012  Component Date Value Range Status  . Cholesterol 08/29/2012 88  0 - 200 mg/dL Final   ATP III Classification       Desirable:  < 200 mg/dL               Borderline High:  200 - 239 mg/dL          High:  > = 259 mg/dL  . Triglycerides 08/29/2012 37.0  0.0 - 149.0 mg/dL Final   Normal:  <563 mg/dLBorderline High:  150 - 199 mg/dL  . HDL  08/29/2012 42.50  >39.00 mg/dL Final  . VLDL 87/56/4332 7.4  0.0 - 95.1 mg/dL Final  . LDL Cholesterol 08/29/2012 38  0 - 99 mg/dL Final  . Total CHOL/HDL Ratio 08/29/2012 2   Final                  Men          Women1/2 Average Risk     3.4          3.3Average Risk          5.0          4.42X Average Risk          9.6          7.13X Average Risk          15.0          11.0

## 2012-08-29 NOTE — Telephone Encounter (Signed)
Insulin dose reduced today already

## 2012-08-30 ENCOUNTER — Telehealth: Payer: Self-pay | Admitting: *Deleted

## 2012-08-30 NOTE — Progress Notes (Signed)
Quick Note:  Please let patient know that the lab results are normal and no further action needed unless sugars are getting low; need to eat on time ______

## 2012-08-30 NOTE — Telephone Encounter (Signed)
noted 

## 2012-08-31 ENCOUNTER — Telehealth: Payer: Self-pay | Admitting: Hematology and Oncology

## 2012-08-31 ENCOUNTER — Telehealth: Payer: Self-pay | Admitting: *Deleted

## 2012-08-31 NOTE — Telephone Encounter (Signed)
Noted, pt is aware 

## 2012-08-31 NOTE — Telephone Encounter (Signed)
Message copied by Hermenia Bers on Wed Aug 31, 2012  3:04 PM ------      Message from: [name]      Created: Tue Aug 30, 2012  1:34 PM       Please let patient know that the lab results are normal and no further action needed unless sugars are getting low; need to eat on time ------

## 2012-08-31 NOTE — Telephone Encounter (Signed)
, °

## 2012-09-01 ENCOUNTER — Telehealth: Payer: Self-pay | Admitting: *Deleted

## 2012-09-01 NOTE — Telephone Encounter (Signed)
sw the pt on yesterday and she was worried about her appt that had been r/s. She was confused by trying to call transportation and ended up calling me. i gave her a helping hand and called [employer]. They informed me that they only give rides on MON, WEDNESDAY, AND THURS. i gv appt d/t to the pt and the transportation lady mrs. Paulette for 09/15/12 w/labs@ 12:30p, ov@ 1pm, and inj @ 1:45pm here @ [facility]. Pt is aware that i will also mail a letter/avs as well...td

## 2012-09-06 ENCOUNTER — Other Ambulatory Visit: Payer: Self-pay | Admitting: *Deleted

## 2012-09-07 ENCOUNTER — Other Ambulatory Visit: Payer: Self-pay | Admitting: *Deleted

## 2012-09-09 ENCOUNTER — Ambulatory Visit: Payer: Medicare Other

## 2012-09-09 ENCOUNTER — Other Ambulatory Visit: Payer: Medicare Other | Admitting: Lab

## 2012-09-13 ENCOUNTER — Ambulatory Visit: Payer: Medicare Other

## 2012-09-13 ENCOUNTER — Other Ambulatory Visit: Payer: Medicare Other | Admitting: Lab

## 2012-09-15 ENCOUNTER — Ambulatory Visit: Payer: Medicare Other

## 2012-09-15 ENCOUNTER — Other Ambulatory Visit: Payer: Medicare Other | Admitting: Lab

## 2012-09-15 ENCOUNTER — Other Ambulatory Visit: Payer: Self-pay | Admitting: Physician Assistant

## 2012-09-15 ENCOUNTER — Telehealth: Payer: Self-pay | Admitting: *Deleted

## 2012-09-15 NOTE — Telephone Encounter (Signed)
Informed pt of change in appts to Dr. Darrold Span next week on Friday 9/05 at 1 pm.  Informed her Dr. Darrold Span can see her again instead of pt coming today to see another new physician as scheduled.  She agreed, but it took a long time for her to write down appt time and date and she is unsure if she will have transportation.  She is unsure if she even had transportation arranged for today's visit and did not seem aware she was even scheduled today.  Instructed her to not come to [facility] today.  Notified Kathrin Penner that pt may need assistance w/ transportation issues.

## 2012-09-20 ENCOUNTER — Telehealth: Payer: Self-pay | Admitting: Endocrinology

## 2012-09-20 ENCOUNTER — Other Ambulatory Visit: Payer: Self-pay | Admitting: *Deleted

## 2012-09-20 MED ORDER — DOXAZOSIN MESYLATE 2 MG PO TABS: 2.0000 mg | ORAL_TABLET | Freq: Every day | ORAL | Status: DC

## 2012-09-20 MED ORDER — INDAPAMIDE 1.25 MG PO TABS: 1.2500 mg | ORAL_TABLET | ORAL | Status: DC

## 2012-09-20 NOTE — Telephone Encounter (Signed)
rx sent and verbally given to pharmacist

## 2012-09-21 ENCOUNTER — Encounter: Payer: Self-pay | Admitting: *Deleted

## 2012-09-21 ENCOUNTER — Telehealth: Payer: Self-pay | Admitting: *Deleted

## 2012-09-21 NOTE — Telephone Encounter (Signed)
She is aware 

## 2012-09-21 NOTE — Telephone Encounter (Signed)
Patient says her sugars are the same, not high or low

## 2012-09-21 NOTE — Telephone Encounter (Signed)
Pt says you cut her humulin dose by 4 units, she says her eyes have been blurry ever since, wants to know what to do?

## 2012-09-21 NOTE — Telephone Encounter (Signed)
She should discuss her symptoms with the eye doctor

## 2012-09-21 NOTE — Progress Notes (Signed)
Patient's appointment was changed due to Dr. Precious Reel schedule.  CSW contacted Ms. Rotenberry's transportation and rescheduled and also communicated changes with patient.  CSW mailed patient schedule for upcoming appointment.  Patient is aware to contact CSW with any questions or concerns.  Kathrin Penner, MSW, LCSW Clinical Social Worker [facility] [phone]

## 2012-09-21 NOTE — Telephone Encounter (Signed)
Not from the change..current glucose range?

## 2012-09-22 ENCOUNTER — Telehealth: Payer: Self-pay

## 2012-09-22 ENCOUNTER — Other Ambulatory Visit: Payer: Self-pay

## 2012-09-22 DIAGNOSIS — C50912 Malignant neoplasm of unspecified site of left female breast: Secondary | ICD-10-CM

## 2012-09-22 MED ORDER — LETROZOLE 2.5 MG PO TABS: 2.5000 mg | ORAL_TABLET | Freq: Every day | ORAL | Status: DC

## 2012-09-22 NOTE — Telephone Encounter (Signed)
Spoke with [name] regarding the refill for the Femara and decreased bone density as noted below. Ms. [name] not sure of  dose of Vit. D. Thinks the tablet is 200mg . Her vision is poor. She takes 1 tab daily.  It could be in combination with calcium.  She will take 2 tablets daily until visit on 10-07-12. Requested that she bring the bottles of medication for verification especially of Vitamin D.  Pt. Verbalized understanding. Told her that Kathrin Penner has sent her appointment schedule to her home and has set up transportation for 10-07-12.  Pt. Appreciative.

## 2012-09-22 NOTE — Telephone Encounter (Signed)
Message copied by Lorine Bears on Thu Sep 22, 2012 11:21 AM ------      Message from: Reece Packer      Created: Wed Sep 21, 2012  8:16 AM       Note for MD re refilling Femara, as bone density is lower by recent scan; apt [facility] 10-07-12            OK to refill Femara. Let patient know bone density has decreased and we will discuss at upcoming apt. She needs to be taking calcium with D 1200 mg daily, which I do not see on med list.            Cc LA, TH ------

## 2012-09-23 ENCOUNTER — Other Ambulatory Visit: Payer: Medicare Other | Admitting: Lab

## 2012-09-23 ENCOUNTER — Ambulatory Visit: Payer: Medicare Other | Admitting: Oncology

## 2012-09-29 DIAGNOSIS — M79609 Pain in unspecified limb: Secondary | ICD-10-CM | POA: Diagnosis not present

## 2012-09-29 DIAGNOSIS — B351 Tinea unguium: Secondary | ICD-10-CM | POA: Diagnosis not present

## 2012-10-06 ENCOUNTER — Other Ambulatory Visit: Payer: Self-pay

## 2012-10-06 DIAGNOSIS — D649 Anemia, unspecified: Secondary | ICD-10-CM

## 2012-10-07 ENCOUNTER — Ambulatory Visit: Payer: Medicare Other | Admitting: Oncology

## 2012-10-07 ENCOUNTER — Encounter: Payer: Self-pay | Admitting: Oncology

## 2012-10-07 ENCOUNTER — Telehealth: Payer: Self-pay | Admitting: Oncology

## 2012-10-07 ENCOUNTER — Ambulatory Visit: Payer: Medicare Other

## 2012-10-07 ENCOUNTER — Other Ambulatory Visit (HOSPITAL_BASED_OUTPATIENT_CLINIC_OR_DEPARTMENT_OTHER): Payer: Medicare Other | Admitting: Lab

## 2012-10-07 ENCOUNTER — Ambulatory Visit (HOSPITAL_BASED_OUTPATIENT_CLINIC_OR_DEPARTMENT_OTHER): Payer: Medicare Other | Admitting: Oncology

## 2012-10-07 VITALS — BP 148/62 | HR 55 | Temp 97.9°F | Resp 18 | Ht 66.0 in | Wt 190.0 lb

## 2012-10-07 DIAGNOSIS — C50419 Malignant neoplasm of upper-outer quadrant of unspecified female breast: Secondary | ICD-10-CM

## 2012-10-07 DIAGNOSIS — E119 Type 2 diabetes mellitus without complications: Secondary | ICD-10-CM | POA: Diagnosis not present

## 2012-10-07 DIAGNOSIS — C50912 Malignant neoplasm of unspecified site of left female breast: Secondary | ICD-10-CM | POA: Insufficient documentation

## 2012-10-07 DIAGNOSIS — D649 Anemia, unspecified: Secondary | ICD-10-CM | POA: Diagnosis not present

## 2012-10-07 DIAGNOSIS — N189 Chronic kidney disease, unspecified: Secondary | ICD-10-CM

## 2012-10-07 DIAGNOSIS — Z23 Encounter for immunization: Secondary | ICD-10-CM

## 2012-10-07 DIAGNOSIS — D631 Anemia in chronic kidney disease: Secondary | ICD-10-CM | POA: Diagnosis not present

## 2012-10-07 DIAGNOSIS — Z170 Estrogen receptor positive status [ER+]: Secondary | ICD-10-CM | POA: Diagnosis not present

## 2012-10-07 DIAGNOSIS — M899 Disorder of bone, unspecified: Secondary | ICD-10-CM

## 2012-10-07 LAB — CBC WITH DIFFERENTIAL/PLATELET
Basophils Absolute: 0 10*3/uL (ref 0.0–0.1)
EOS%: 2.4 % (ref 0.0–7.0)
Eosinophils Absolute: 0.2 10*3/uL (ref 0.0–0.5)
HCT: 31.8 % — ABNORMAL LOW (ref 34.8–46.6)
HGB: 10 g/dL — ABNORMAL LOW (ref 11.6–15.9)
MCH: 23.6 pg — ABNORMAL LOW (ref 25.1–34.0)
MCV: 75.1 fL — ABNORMAL LOW (ref 79.5–101.0)
NEUT#: 4.3 10*3/uL (ref 1.5–6.5)
NEUT%: 62.5 % (ref 38.4–76.8)
lymph#: 1.9 10*3/uL (ref 0.9–3.3)

## 2012-10-07 MED ORDER — DARBEPOETIN ALFA-POLYSORBATE 300 MCG/0.6ML IJ SOLN
300.0000 ug | Freq: Once | INTRAMUSCULAR | Status: AC
  Administered 2012-10-07: 300 ug via SUBCUTANEOUS
  Filled 2012-10-07: qty 0.6

## 2012-10-07 MED ORDER — INFLUENZA VAC SPLIT QUAD 0.5 ML IM SUSP
0.5000 mL | Freq: Once | INTRAMUSCULAR | Status: AC
  Administered 2012-10-07: 0.5 mL via INTRAMUSCULAR
  Filled 2012-10-07: qty 0.5

## 2012-10-07 NOTE — Patient Instructions (Signed)
Please be sure you are taking calcium 1200 mg daily. This is important because your bones are a little thin and you are on Femara for the breast cancer.  You had flu shot and aranesp shot today  Continue Femara

## 2012-10-07 NOTE — Progress Notes (Signed)
OFFICE PROGRESS NOTE   10/07/2012   Physicians:Cammie Fulp (PCP), Drinda Butts, H.Derrell Lolling   INTERVAL HISTORY:  Patient is seen, alone for visit, in follow up of history of left breast cancer for which she continues Femara, this begun in 2010 by Dr Donnie Coffin. She is also receiving aranesp ~ monthly for anemia related to chronic renal disease and other chronic disease. Last aranesp was in July.  Transportation to and from this visit was arranged by [facility], who has also met with patient today to assist with next appointments and follow up of instructions. Patient seems to have been at baseline since she was seen last 6 months ago. She continues to live alone with assistance of Meals on Wheels, is able to read appointments if large print. Her daughter also assists with care but is at work now. She had right mammogram and bone density scan done at [facility] in June, needs to be on supplemental calcium (apparently not on this, as best we can confirm medications), had follow up of diabetes with Dr Lucianne Muss last month. She does not have PAC.   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 This was intermediate grade, ER positive 100%, PR + 70%, HER 2 negative. She had mastectomy with sentinel node evaluation, with 2 separate lesions, one 1.7 cm and the other 0.7 cm, margins clear and 2 sentinel nodes involved + one additional node negative. Patient declined radiation but has been on Femara since ~ June 2010. Dr Donnie Coffin has seen her ~ every 6 months. In this EMR she has reports of CXR in 2010 and CT head in 03-2010 (refused staging studies at diagnosis).  She also receives aranesp ~ monthly for anemia related to chronic kidney disease and other chronic disease. Last iron studies in this EMR were 04-2011 and will be repeated with upcoming labs.   Review of systems as above, also: Uses WC for all  of office activity today. Denies new or different pain including arthralgias suggestive of Femara discomfort. Denies recent infectious illness. Appetite ok. Bowels moving as usual. No noted bleeding. No SOB at rest, no other respiratory symptoms. No changes with left mastectomy or remaining right breast apparent to patient. No recent low blood sugar episodes. No increased swelling LE. Remainder of 10 point Review of Systems negative.  Objective:  Vital signs in last 24 hours:  BP 148/62  Pulse 55  Temp(Src) 97.9 F (36.6 C) (Oral)  Resp 18  Ht 5\' 6"  (1.676 m)  Wt 190 lb (86.183 kg)  BMI 30.68 kg/m2  Alert, oriented and appropriate.Seated in WC, respirations not labored RA, eating crackers and drinking juice now. She is delighted to see Roselee Nova.  HEENT:PERRL, sclerae muddy but not icteric. Oral mucosa moist without lesions, posterior pharynx clear.  Neck supple. No JVD.  Lymphatics:no cervical,suraclavicular, axillary adenopathy Resp: clear to auscultation bilaterally, no wheezes or rales. Cardio: regular rate and rhythm. No gallop. GI: soft, nontender, not distended, no mass or organomegaly.  Musculoskeletal/ Extremities: without pitting edema, cords, tenderness Neuro:  nonfocal on exam just in WC. Speech fluent. Skin without rash, ecchymosis, petechiae Breasts:left mastectomy scar without evidence of local recurrence, right breast without dominant mass, skin or nipple findings. Axillae benign.   Lab Results:  Results for orders placed in visit on 10/07/12  CBC WITH DIFFERENTIAL      Result Value Range   WBC 6.9  3.9 - 10.3 10e3/uL  NEUT# 4.3  1.5 - 6.5 10e3/uL   HGB 10.0 (*) 11.6 - 15.9 g/dL   HCT 21.3 (*) 08.6 - 57.8 %   Platelets 181  145 - 400 10e3/uL   MCV 75.1 (*) 79.5 - 101.0 fL   MCH 23.6 (*) 25.1 - 34.0 pg   MCHC 31.4 (*) 31.5 - 36.0 g/dL   RBC 4.69  6.29 - 5.28 10e6/uL   RDW 15.4 (*) 11.2 - 14.5 %   lymph# 1.9  0.9 - 3.3 10e3/uL   MONO# 0.5  0.1 - 0.9  10e3/uL   Eosinophils Absolute 0.2  0.0 - 0.5 10e3/uL   Basophils Absolute 0.0  0.0 - 0.1 10e3/uL   NEUT% 62.5  38.4 - 76.8 %   LYMPH% 26.7  14.0 - 49.7 %   MONO% 7.7  0.0 - 14.0 %   EOS% 2.4  0.0 - 7.0 %   BASO% 0.7  0.0 - 2.0 %    Vit D Oct 2013 48  Lipid panel 08-29-2012 WNL  CMET 08-29-12 remarkable for glucose 48, BUN 29, creat 1.2, alb 3.2, GFR 57  Studies/Results: Bone Density     07-14-2012 Young Adult T Score: -0.4  Z Score: 2.7  LEFT FEMUR NECK  Bone Mineral Density (BMD): 0.647 g/cm2  Young Adult T Score: -1.8  Z Score: -0.4  ASSESSMENT: Patient's diagnostic category is LOW BONE MASS by WHO  Criteria.  FRACTURE RISK: MODERATE  FRAX: Based on the World Health Organization FRAX model, the 10  year probability of a major osteoporotic fracture is 5.8%. The 10  year probability of a hip fracture is 1.4%.  Comparison: 07/11/2010, 07/02/2008. There has been statistically  significant loss of bone mineral density since the previous exam at  the left total hip of 11.1%. There has been statistically  insignificant 0.6% loss of bone mineral density at the left distal  radius since the prior exam. Comparison the baseline exam is  dissimilar and cannot be performed.    *RADIOLOGY REPORT* 07-14-12 [facility] Clinical Data: Screening. History of left breast cancer and left  mastectomy.  DIGITAL SCREENING UNILATERAL RIGHT MAMMOGRAM WITH CAD  Comparison: Previous exams.  FINDINGS:  ACR Breast Density Category b: There are scattered areas of  fibroglandular density.  There are no findings suspicious for malignancy.  Images were processed with CAD.  IMPRESSION:  No mammographic evidence of malignancy.     Medications: I have reviewed the patient's current medications. She will continue Femara and vitamin D and needs to be sure to get in 1200 mg of calcium daily.    Assessment/Plan:  1. Left breast cancer T1N1 ER/PR +, HER 2 negative, 2 sentinel nodes involved: out  almost 4 years from diagnosis, post mastectomy with 3 node axillary evaluation and continuing Femara. She will need right mammogram in late June at [facility], and should have bone density also then. I will see her back coordinating with aranesp in ~ Dec, or sooner if needed.  2.osteopenia, on high risk aromatase inhibitor: bone density scan as above. With breast cancer node +, benefit of aromatase inhibitor likely greater than risk unless very dramatic drop in bone density on upcoming scan. Add oral calcium 1200 mg daily 3.anemia secondary to chronic kidney disease and chronic disease:. She generally needs the aranesp ~ 3/4 months, by CBC each.  4.degenerative arthritis  5.poor visual acuity  6.chronic tremor  7.diabetes on insulin: followed by Dr Almira Bar [facility] heart disease  We appreciate assistance of SW  with this nice lady.     LIVESAY,LENNIS P, MD   10/07/2012, 2:36 PM

## 2012-10-09 ENCOUNTER — Ambulatory Visit: Payer: Medicare Other | Admitting: Oncology

## 2012-10-13 ENCOUNTER — Encounter: Payer: Self-pay | Admitting: *Deleted

## 2012-10-13 NOTE — Progress Notes (Signed)
Clinical Social Work scheduled patient's upcoming appointments (10/17, 11/14, and 12/12) with [employer] transportation.  CSW will send a list of these dates and times in large print to patient by mail.  Patient verbalized understanding and will call CSW with questions or concerns.  Kathrin Penner, MSW, LCSW Clinical Social Worker [facility] [phone]

## 2012-10-14 ENCOUNTER — Other Ambulatory Visit: Payer: Medicare Other | Admitting: Lab

## 2012-10-14 ENCOUNTER — Ambulatory Visit: Payer: Medicare Other

## 2012-11-04 ENCOUNTER — Ambulatory Visit (HOSPITAL_BASED_OUTPATIENT_CLINIC_OR_DEPARTMENT_OTHER): Payer: Medicare Other

## 2012-11-04 ENCOUNTER — Other Ambulatory Visit: Payer: Self-pay | Admitting: Physician Assistant

## 2012-11-04 ENCOUNTER — Other Ambulatory Visit (HOSPITAL_BASED_OUTPATIENT_CLINIC_OR_DEPARTMENT_OTHER): Payer: Medicare Other

## 2012-11-04 ENCOUNTER — Other Ambulatory Visit: Payer: Self-pay | Admitting: Oncology

## 2012-11-04 DIAGNOSIS — D631 Anemia in chronic kidney disease: Secondary | ICD-10-CM

## 2012-11-04 DIAGNOSIS — N189 Chronic kidney disease, unspecified: Secondary | ICD-10-CM | POA: Diagnosis not present

## 2012-11-04 DIAGNOSIS — D649 Anemia, unspecified: Secondary | ICD-10-CM

## 2012-11-04 LAB — CBC WITH DIFFERENTIAL/PLATELET
BASO%: 0.8 % (ref 0.0–2.0)
Basophils Absolute: 0.1 10*3/uL (ref 0.0–0.1)
HCT: 32.3 % — ABNORMAL LOW (ref 34.8–46.6)
HGB: 10.1 g/dL — ABNORMAL LOW (ref 11.6–15.9)
LYMPH%: 20.1 % (ref 14.0–49.7)
MCHC: 31.2 g/dL — ABNORMAL LOW (ref 31.5–36.0)
MONO#: 0.6 10*3/uL (ref 0.1–0.9)
NEUT%: 69.7 % (ref 38.4–76.8)
Platelets: 205 10*3/uL (ref 145–400)
WBC: 7.1 10*3/uL (ref 3.9–10.3)
lymph#: 1.4 10*3/uL (ref 0.9–3.3)

## 2012-11-04 LAB — IRON AND TIBC CHCC
%SAT: 39 % (ref 21–57)
UIBC: 90 ug/dL — ABNORMAL LOW (ref 120–384)

## 2012-11-04 MED ORDER — DARBEPOETIN ALFA-POLYSORBATE 300 MCG/0.6ML IJ SOLN
300.0000 ug | Freq: Once | INTRAMUSCULAR | Status: DC
  Administered 2012-11-04: 300 ug via SUBCUTANEOUS
  Filled 2012-11-04: qty 0.6

## 2012-11-07 ENCOUNTER — Telehealth: Payer: Self-pay | Admitting: Endocrinology

## 2012-11-07 MED ORDER — LEVOTHYROXINE SODIUM 112 MCG PO TABS: 112.0000 ug | ORAL_TABLET | Freq: Every day | ORAL | Status: DC

## 2012-11-07 NOTE — Telephone Encounter (Signed)
rx sent

## 2012-11-11 ENCOUNTER — Other Ambulatory Visit: Payer: Medicare Other | Admitting: Lab

## 2012-11-11 ENCOUNTER — Ambulatory Visit: Payer: Medicare Other

## 2012-11-21 ENCOUNTER — Telehealth: Payer: Self-pay | Admitting: *Deleted

## 2012-11-21 NOTE — Telephone Encounter (Signed)
This RN received call from pt wanting to clarify appointments due to need to arrange transportation.  Pt states she is scheduled more often than " what the doctor told me "  Per conversation noted monthly appts which pt states she was told she only needed to come every 3 months. She is taking the pills prescribed by the doctor and is tolerating them well.  Pt is requesting a call ASAP regarding appointments so she can properly arrange transportation.

## 2012-11-22 ENCOUNTER — Telehealth: Payer: Self-pay

## 2012-11-22 NOTE — Telephone Encounter (Signed)
Spoke with [name] land clarified appointment schedule for the cancer center.  [name] CSW mailed her appts.  She received this information.  She stated that she got her doctors confused.  She will keep her appoinment on 12-02-12 for lab and possible injection.

## 2012-11-28 ENCOUNTER — Ambulatory Visit (INDEPENDENT_AMBULATORY_CARE_PROVIDER_SITE_OTHER): Payer: Medicare Other | Admitting: Endocrinology

## 2012-11-28 ENCOUNTER — Encounter: Payer: Self-pay | Admitting: Endocrinology

## 2012-11-28 ENCOUNTER — Ambulatory Visit
Admission: RE | Admit: 2012-11-28 | Discharge: 2012-11-28 | Disposition: A | Discharge status: Home / Self Care | Payer: Medicare Other | Source: Ambulatory Visit | Attending: Endocrinology | Admitting: Endocrinology

## 2012-11-28 VITALS — BP 120/72 | HR 60 | Temp 98.3°F | Resp 12 | Wt 192.6 lb

## 2012-11-28 DIAGNOSIS — E039 Hypothyroidism, unspecified: Secondary | ICD-10-CM

## 2012-11-28 DIAGNOSIS — R0789 Other chest pain: Secondary | ICD-10-CM

## 2012-11-28 DIAGNOSIS — I10 Essential (primary) hypertension: Secondary | ICD-10-CM

## 2012-11-28 DIAGNOSIS — R079 Chest pain, unspecified: Secondary | ICD-10-CM | POA: Diagnosis not present

## 2012-11-28 DIAGNOSIS — R071 Chest pain on breathing: Secondary | ICD-10-CM

## 2012-11-28 NOTE — Patient Instructions (Signed)
INSULIN 22 UNITS ABOUT 10 MIN BEFORE BFST DAILY

## 2012-11-28 NOTE — Progress Notes (Signed)
Patient ID: [name], female   DOB: August 17, 1930, 79 y.o.   MRN: [id number]  [name] is an 79 y.o. female.   Reason for Appointment: Diabetes follow-up   History of Present Illness   Diagnosis: Type 2 DIABETES MELITUS, long-standing  She has been on insulin for several years and most of the time has been on NPH or premixed  insulin for convenience and simplicity About a year ago because of hypoglycemia overnight her evening insulin was stopped Also for unclear reasons her insulin requirement has been much less over the last year  Also has had some weight loss Again her blood sugars appear to be overall very well controlled with average reading 137 and only sporadic high readings She generally compliant with taking her insulin before breakfast daily Again is taking a blood sugar up to 4 times a day including some during the night Hyperglycemia: This occurs sporadically midday and after supper but her highest median reading is only 147 after supper  Insulin regimen: Humulin 70/30 28 units in the morning       She has not been on oral hypoglycemic drugs for several years      Proper timing of medications in relation to meals: Yes.         Monitors blood glucose: Once a day.    Glucometer: One Touch.          Blood Glucose readings from meter download: readings before breakfast: Median 129 with range 113-181, nonfasting 68-221  Hypoglycemia frequency:  has had only one low reading of 68 at 7 PM on Saturday Symptoms of low sugar of feeling weak and woozy.          Meals:  2-3 meals per day.          The last HbgA1c was reported as 5.4   Wt Readings from Last 3 Encounters:  11/28/12 192 lb 9.6 oz (87.363 kg)  10/07/12 190 lb (86.183 kg)  05/13/12 203 lb (92.08 kg)   Lab Results  Component Value Date   HGBA1C 5.3 08/29/2012   HGBA1C  Value: 8.0 (NOTE) The ADA recommends the following therapeutic goal for glycemic control related to Hgb A1c measurement: Goal of therapy: <6.5  Hgb A1c  Reference: American Diabetes Association: Clinical Practice Recommendations 2010, Diabetes Care, 2010, 33: (Suppl  1).* 06/09/2008   Lab Results  Component Value Date   MICROALBUR 8.4* 08/29/2012   LDLCALC 38 08/29/2012   CREATININE 1.2 08/29/2012   CREATININE 1.2 08/29/2012   PROBLEM #2:  Patient is asking about left upper chest pain. She has had this for 2 weeks She thinks this is deep-seated but it is fairly consistent It may be somewhat more with movements. She thinks the pain is somewhat less with reducing her calcium tablet to half a tablet No shortness of breath and no tightness in her chest Has not discussed this with her PCP     Medication List       This list is accurate as of: 11/28/12  3:35 PM.  Always use your most recent med list.               B-complex with vitamin C tablet  Take 1 tablet by mouth daily.     CALCIUM 600 600 MG Tabs tablet  Generic drug:  calcium carbonate  Take 600 mg by mouth 2 (two) times daily with a meal.     diazepam 5 MG tablet  Commonly known as:  VALIUM  Take 5  mg by mouth every 6 (six) hours as needed.     doxazosin 2 MG tablet  Commonly known as:  CARDURA  Take 1 tablet (2 mg total) by mouth at bedtime.     ferrous gluconate 325 MG tablet  Commonly known as:  FERGON  Take 325 mg by mouth daily with breakfast.     HUMULIN 70/30 (70-30) 100 UNIT/ML injection  Generic drug:  insulin NPH-regular  28 Units.     Hydrocodone-Acetaminophen 10-660 MG Tabs  10-325 mg.     indapamide 1.25 MG tablet  Commonly known as:  LOZOL  Take 1 tablet (1.25 mg total) by mouth every morning.     INSULIN SYRINGE 1CC/29G 29G X 1/2" 1 ML Misc  by Does not apply route.     irbesartan 150 MG tablet  Commonly known as:  AVAPRO     IRON PO  Take 65 mg by mouth daily.     KLOR-CON M20 20 MEQ tablet  Generic drug:  potassium chloride SA     letrozole 2.5 MG tablet  Commonly known as:  FEMARA  Take 1 tablet (2.5 mg total) by mouth  daily.     levothyroxine 112 MCG tablet  Commonly known as:  SYNTHROID, LEVOTHROID  Take 1 tablet (112 mcg total) by mouth daily before breakfast.     metoprolol succinate 50 MG 24 hr tablet  Commonly known as:  TOPROL-XL     PERIDIN-C PO  Take by mouth.     rOPINIRole 3 MG tablet  Commonly known as:  REQUIP     traZODone 100 MG tablet  Commonly known as:  DESYREL     vitamin C 1000 MG tablet  Take 1,000 mg by mouth daily.     Vitamin D 2000 UNITS tablet  Take 2,000 Units by mouth daily.        Allergies:  Allergies  Allergen Reactions  . Aspirin   . Insulins     NOVA?  Marland Kitchen Penicillins     Past Medical History  Diagnosis Date  . Hemorrhoid   . Anemia   . Arthritis     Past Surgical History  Procedure Laterality Date  . Breast lumpectomy      LEFT  . Abdominal hysterectomy      PARTIAL  . Mastectomy  05/2008    Left    No family history on file.  Social History:  reports that she has never smoked. She does not have any smokeless tobacco history on file. She reports that she does not drink alcohol. Her drug history is not on file.  Review of Systems:  HYPERTENSION: This is long-standing and has been well controlled, requiring lower doses of medications in the last year     HYPOTHYROIDISM: She has had long-standing hypothyroidism and the dose has been stable more recently  She has had leg weakness and is in a wheelchair  Has a history of mild anemia  No history of hypercholesterolemia     Examination:   BP 120/72  Pulse 60  Temp(Src) 98.3 F (36.8 C)  Resp 12  Wt 192 lb 9.6 oz (87.363 kg)  SpO2 97%  Body mass index is 31.1 kg/(m^2).    No lymphadenopathy in the neck Chest wall nontender. Lungs are clear Heart sounds are normal She has a parkinsonian tremor No pedal edema  ASSESSMENT/ PLAN::   Diabetes type 2   The patient's diabetes control appears to be overall good especially considering her age and multiple  medical  problems Currently is doing fairly well with only one injection of 70/30 insulin in the morning Because of her tendency to relatively lower readings in the last 3 days recently will reduce her dosage to 22 units in the morning  CHEST wall pain: Most likely this is non-cardiac and persistent for 2 weeks Will send her for a chest x-ray today since she has difficulty with transportation and forward results to PCP She can call PCP if discomfort not improve  HYPERTENSION: Blood pressure is excellent She has had a recent history of renal dysfunction and will recheck her labs today  HYPOTHYROIDISM: Difficult to judge her symptoms and will check her TSH again  Anemia: Followed by hematologist , has had Procrit  Punam Broussard 11/28/2012, 3:35 PM   Addendum: A1c excellent at 5.3, TSH normal

## 2012-11-28 NOTE — Progress Notes (Signed)
Quick Note:  Please let patient know that the result is normal and no further action needed ______ 

## 2012-12-02 ENCOUNTER — Other Ambulatory Visit (HOSPITAL_BASED_OUTPATIENT_CLINIC_OR_DEPARTMENT_OTHER): Payer: Medicare Other | Admitting: Lab

## 2012-12-02 ENCOUNTER — Ambulatory Visit (HOSPITAL_BASED_OUTPATIENT_CLINIC_OR_DEPARTMENT_OTHER): Payer: Medicare Other

## 2012-12-02 ENCOUNTER — Other Ambulatory Visit: Payer: Self-pay | Admitting: Physician Assistant

## 2012-12-02 VITALS — BP 140/70 | HR 54 | Temp 98.1°F | Resp 18

## 2012-12-02 DIAGNOSIS — N189 Chronic kidney disease, unspecified: Secondary | ICD-10-CM

## 2012-12-02 DIAGNOSIS — D631 Anemia in chronic kidney disease: Secondary | ICD-10-CM

## 2012-12-02 DIAGNOSIS — D649 Anemia, unspecified: Secondary | ICD-10-CM

## 2012-12-02 LAB — CBC WITH DIFFERENTIAL/PLATELET
BASO%: 0.8 % (ref 0.0–2.0)
Basophils Absolute: 0.1 10*3/uL (ref 0.0–0.1)
EOS%: 2.1 % (ref 0.0–7.0)
HCT: 32.5 % — ABNORMAL LOW (ref 34.8–46.6)
HGB: 10.1 g/dL — ABNORMAL LOW (ref 11.6–15.9)
MCH: 23.8 pg — ABNORMAL LOW (ref 25.1–34.0)
MCHC: 31 g/dL — ABNORMAL LOW (ref 31.5–36.0)
MCV: 76.7 fL — ABNORMAL LOW (ref 79.5–101.0)
MONO#: 0.6 10*3/uL (ref 0.1–0.9)
MONO%: 8.4 % (ref 0.0–14.0)
RBC: 4.24 10*6/uL (ref 3.70–5.45)
RDW: 15.6 % — ABNORMAL HIGH (ref 11.2–14.5)
WBC: 7 10*3/uL (ref 3.9–10.3)
lymph#: 1.5 10*3/uL (ref 0.9–3.3)

## 2012-12-02 MED ORDER — DARBEPOETIN ALFA-POLYSORBATE 300 MCG/0.6ML IJ SOLN
300.0000 ug | Freq: Once | INTRAMUSCULAR | Status: AC
  Administered 2012-12-02: 300 ug via SUBCUTANEOUS
  Filled 2012-12-02: qty 0.6

## 2012-12-07 DIAGNOSIS — M1710 Unilateral primary osteoarthritis, unspecified knee: Secondary | ICD-10-CM | POA: Diagnosis not present

## 2012-12-08 DIAGNOSIS — M79609 Pain in unspecified limb: Secondary | ICD-10-CM | POA: Diagnosis not present

## 2012-12-08 DIAGNOSIS — B351 Tinea unguium: Secondary | ICD-10-CM | POA: Diagnosis not present

## 2012-12-16 ENCOUNTER — Ambulatory Visit: Payer: Medicare Other

## 2012-12-16 ENCOUNTER — Other Ambulatory Visit: Payer: Medicare Other | Admitting: Lab

## 2012-12-25 ENCOUNTER — Other Ambulatory Visit: Payer: Self-pay | Admitting: Oncology

## 2012-12-30 ENCOUNTER — Telehealth: Payer: Self-pay | Admitting: *Deleted

## 2012-12-30 ENCOUNTER — Encounter (INDEPENDENT_AMBULATORY_CARE_PROVIDER_SITE_OTHER): Payer: Self-pay

## 2012-12-30 ENCOUNTER — Ambulatory Visit: Payer: Medicare Other

## 2012-12-30 ENCOUNTER — Ambulatory Visit (HOSPITAL_BASED_OUTPATIENT_CLINIC_OR_DEPARTMENT_OTHER): Payer: Medicare Other | Admitting: Oncology

## 2012-12-30 ENCOUNTER — Encounter: Payer: Self-pay | Admitting: Oncology

## 2012-12-30 ENCOUNTER — Other Ambulatory Visit: Payer: Self-pay | Admitting: *Deleted

## 2012-12-30 ENCOUNTER — Other Ambulatory Visit (HOSPITAL_BASED_OUTPATIENT_CLINIC_OR_DEPARTMENT_OTHER): Payer: Medicare Other

## 2012-12-30 VITALS — BP 173/75 | HR 57 | Temp 98.9°F | Resp 18 | Ht 66.0 in | Wt 186.8 lb

## 2012-12-30 DIAGNOSIS — D638 Anemia in other chronic diseases classified elsewhere: Secondary | ICD-10-CM | POA: Diagnosis not present

## 2012-12-30 DIAGNOSIS — Z170 Estrogen receptor positive status [ER+]: Secondary | ICD-10-CM | POA: Diagnosis not present

## 2012-12-30 DIAGNOSIS — D649 Anemia, unspecified: Secondary | ICD-10-CM

## 2012-12-30 DIAGNOSIS — M899 Disorder of bone, unspecified: Secondary | ICD-10-CM | POA: Diagnosis not present

## 2012-12-30 DIAGNOSIS — C50419 Malignant neoplasm of upper-outer quadrant of unspecified female breast: Secondary | ICD-10-CM

## 2012-12-30 DIAGNOSIS — E119 Type 2 diabetes mellitus without complications: Secondary | ICD-10-CM | POA: Diagnosis not present

## 2012-12-30 DIAGNOSIS — D631 Anemia in chronic kidney disease: Secondary | ICD-10-CM | POA: Diagnosis not present

## 2012-12-30 DIAGNOSIS — N189 Chronic kidney disease, unspecified: Secondary | ICD-10-CM

## 2012-12-30 LAB — CBC WITH DIFFERENTIAL/PLATELET
BASO%: 0.6 % (ref 0.0–2.0)
Basophils Absolute: 0.1 10*3/uL (ref 0.0–0.1)
EOS%: 2.4 % (ref 0.0–7.0)
HCT: 36.2 % (ref 34.8–46.6)
HGB: 11.3 g/dL — ABNORMAL LOW (ref 11.6–15.9)
MCHC: 31.1 g/dL — ABNORMAL LOW (ref 31.5–36.0)
MONO#: 0.7 10*3/uL (ref 0.1–0.9)
NEUT#: 5.8 10*3/uL (ref 1.5–6.5)
NEUT%: 68.9 % (ref 38.4–76.8)
RDW: 16 % — ABNORMAL HIGH (ref 11.2–14.5)
WBC: 8.4 10*3/uL (ref 3.9–10.3)
lymph#: 1.6 10*3/uL (ref 0.9–3.3)

## 2012-12-30 NOTE — Telephone Encounter (Signed)
appts made and printed per LL it was ok to place pt last inj w/ ov on 05/12/12 per pt request...td

## 2012-12-30 NOTE — Progress Notes (Signed)
OFFICE PROGRESS NOTE   12/30/2012   Physicians:Cammie Fulp (PCP), Drinda Butts, H.Derrell Lolling   INTERVAL HISTORY:  Patient is seen, alone for visit, in scheduled follow up of history of T1N1 left breast cancer for which she continues Femara, this to complete 5 years in June 2015. She also receives aranesp monthly for anemia related to chronic renal failure, tho hemoglobin of 11.3 today does not meet parameters for aranesp and she will not have injection now (hold for >11). Last right mammogram was at [facility] 07-14-2012 and last bone density scan in [facility] system was 07-02-12, with osteopenia. She had CXR 11-29-2012 for complaint of chest pain, with lungs clear and cardiac enlargement without CHF. She did have aranesp 10-17 and 12-02-12. Iron studies 11-04-12 were adequate, with serum iron 58 and %sat 39.  Patient fell in bathroom at daughter's home on Thanksgiving, slipping on rug. She has had no other falls. She had bruising right abdomen which has resolved, did not hit head. She has otherwise felt at baseline, denying SOB or other respiratory symptoms, appetite good, no LE swelling, no new or different pain, no changes at left mastectomy scar or in right breast. She has tolerated addition of calcium tablets one AM and one midday since she was here last. Dr Lucianne Muss is adjusting insulin. Blurred vision unchanged.     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 Femara begun ~ June 2010.      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 This was intermediate grade, ER positive 100%, PR + 70%, HER 2 negative. She had mastectomy with sentinel node  evaluation, with 2 separate lesions, one 1.7 cm and the other 0.7 cm, margins clear and 2 sentinel nodes involved + one additional node negative. Patient declined radiation but has been on Femara since ~ June 2010. Dr Donnie Coffin has seen her ~ every 6 months. In this EMR she has reports of CXR in 2010 and CT head in 03-2010 (refused staging studies at diagnosis).  She also receives aranesp ~ monthly for anemia related to chronic kidney disease and other chronic disease. Last iron studies in this EMR were 04-2011 and will be repeated with upcoming labs.  Review of systems as above, also: No fever or symptoms of infection. No bleeding. Remainder of 10 point Review of Systems negative.  Objective:  Vital signs in last 24 hours:  BP 173/75  Pulse 57  Temp(Src) 98.9 F (37.2 C) (Oral)  Resp 18  Ht 5\' 6"  (1.676 m)  Wt 186 lb 12.8 oz (84.732 kg)  BMI 30.16 kg/m2  Alert, oriented and appropriate. In WC, respirations not labored RA. A HEENT:PERRL, sclerae not icteric. Oral mucosa moist without lesions, posterior pharynx clear.  Neck supple. No JVD.  Lymphatics:no cervical,suraclavicular, axillary adenopathy Resp: clear to auscultation bilaterally  Cardio: regular rate and rhythm. No gallop. ZO:XWRUE, soft, nontender, not distended, no mass or organomegaly. Normally active bowel sounds. Musculoskeletal/ Extremities: without pitting edema, cords, tenderness. Back not tender. Neuro: no peripheral neuropathy. Otherwise nonfocal Skin without rash, ecchymosis, petechiae Breasts:Right without dominant mass, skin or nipple findings. Left mastectomy scar without evidence of local recurrence. Axillae benign.   Lab Results:  Results for  orders placed in visit on 12/30/12  CBC WITH DIFFERENTIAL      Result Value Range   WBC 8.4  3.9 - 10.3 10e3/uL   NEUT# 5.8  1.5 - 6.5 10e3/uL   HGB 11.3 (*) 11.6 - 15.9 g/dL   HCT 16.1  09.6 - 04.5 %   Platelets 189  145 - 400 10e3/uL   MCV 78.3 (*) 79.5 - 101.0 fL    MCH 24.3 (*) 25.1 - 34.0 pg   MCHC 31.1 (*) 31.5 - 36.0 g/dL   RBC 4.09  8.11 - 9.14 10e6/uL   RDW 16.0 (*) 11.2 - 14.5 %   lymph# 1.6  0.9 - 3.3 10e3/uL   MONO# 0.7  0.1 - 0.9 10e3/uL   Eosinophils Absolute 0.2  0.0 - 0.5 10e3/uL   Basophils Absolute 0.1  0.0 - 0.1 10e3/uL   NEUT% 68.9  38.4 - 76.8 %   LYMPH% 19.3  14.0 - 49.7 %   MONO% 8.8  0.0 - 14.0 %   EOS% 2.4  0.0 - 7.0 %   BASO% 0.6  0.0 - 2.0 %    CMET already ordered by Dr Lucianne Muss for 03-01-12  Studies/Results:  No results found.  Medications: I have reviewed the patient's current medications. Now on calcium 1200 mg daily with Vit D  [facility] social worker assisting with transportation for [facility] visits and met with patient today, that support appreciated.  Assessment/Plan: 1. Left breast cancer T1N1 ER/PR +, HER 2 negative, 2 sentinel nodes involved: out 4.5 years from diagnosis, post mastectomy with 3 node axillary evaluation, and continuing Femara. She will need right mammogram in late June at [facility], and should have bone density also then. I will see her back coordinating with aranesp in ~ 4 months.  2.osteopenia, on high risk aromatase inhibitor: bone density scan as above. With breast cancer node +, benefit of aromatase inhibitor likely greater than risk.  Tolerating oral calcium 1200 mg daily.  3.anemia secondary to chronic kidney disease and chronic disease:. She generally needs the aranesp ~ 3/4 months, by CBC each. Hold aranesp if Hgb >11. Iron stores adequate for epogen by labs 10-2012 4.degenerative arthritis  5.poor visual acuity  6.chronic tremor  7.diabetes on insulin: followed by Dr Almira Bar [facility] heart disease     Reece Packer, MD   12/30/2012, 2:37 PM

## 2012-12-30 NOTE — Progress Notes (Signed)
hgb 11.3 no need for aranesp.

## 2012-12-30 NOTE — Patient Instructions (Signed)
Continue calcium tablets twice daily

## 2013-01-01 ENCOUNTER — Other Ambulatory Visit: Payer: Self-pay | Admitting: Oncology

## 2013-01-16 ENCOUNTER — Telehealth: Payer: Self-pay | Admitting: *Deleted

## 2013-01-16 NOTE — Telephone Encounter (Signed)
Her last foot exam showed that she did not qualify for diabetic shoes because of Medicare regulations. Will need to recheck her feet to see if this has changed on the next visit

## 2013-01-16 NOTE — Telephone Encounter (Signed)
Pt called wanting her form filled out for her diabetic shoes, I told her that you had previously denied it due to her being non ambulatory, she said she's only in the wheelchair when she rides and goes to appts, other wise she is up and walking around. Please advise.

## 2013-01-20 ENCOUNTER — Other Ambulatory Visit: Payer: Self-pay | Admitting: *Deleted

## 2013-01-20 NOTE — Telephone Encounter (Signed)
Please advise refill? 

## 2013-01-23 MED ORDER — POTASSIUM CHLORIDE CRYS ER 20 MEQ PO TBCR: 20.0000 meq | EXTENDED_RELEASE_TABLET | Freq: Once | ORAL | Status: DC

## 2013-01-27 ENCOUNTER — Other Ambulatory Visit (HOSPITAL_BASED_OUTPATIENT_CLINIC_OR_DEPARTMENT_OTHER): Payer: Medicare Other

## 2013-01-27 ENCOUNTER — Ambulatory Visit (HOSPITAL_BASED_OUTPATIENT_CLINIC_OR_DEPARTMENT_OTHER): Payer: Medicare Other

## 2013-01-27 VITALS — BP 113/53 | HR 58 | Temp 98.2°F

## 2013-01-27 DIAGNOSIS — N189 Chronic kidney disease, unspecified: Secondary | ICD-10-CM

## 2013-01-27 DIAGNOSIS — D631 Anemia in chronic kidney disease: Secondary | ICD-10-CM | POA: Diagnosis not present

## 2013-01-27 DIAGNOSIS — N039 Chronic nephritic syndrome with unspecified morphologic changes: Secondary | ICD-10-CM

## 2013-01-27 DIAGNOSIS — D649 Anemia, unspecified: Secondary | ICD-10-CM

## 2013-01-27 LAB — CBC WITH DIFFERENTIAL/PLATELET
BASO%: 0.5 % (ref 0.0–2.0)
BASOS ABS: 0 10*3/uL (ref 0.0–0.1)
EOS ABS: 0.2 10*3/uL (ref 0.0–0.5)
EOS%: 2.2 % (ref 0.0–7.0)
HEMATOCRIT: 31.2 % — AB (ref 34.8–46.6)
HEMOGLOBIN: 9.8 g/dL — AB (ref 11.6–15.9)
LYMPH#: 1.5 10*3/uL (ref 0.9–3.3)
LYMPH%: 21 % (ref 14.0–49.7)
MCH: 24.1 pg — ABNORMAL LOW (ref 25.1–34.0)
MCHC: 31.4 g/dL — ABNORMAL LOW (ref 31.5–36.0)
MCV: 76.6 fL — ABNORMAL LOW (ref 79.5–101.0)
MONO#: 0.6 10*3/uL (ref 0.1–0.9)
MONO%: 8.1 % (ref 0.0–14.0)
NEUT#: 5 10*3/uL (ref 1.5–6.5)
NEUT%: 68.2 % (ref 38.4–76.8)
Platelets: 177 10*3/uL (ref 145–400)
RBC: 4.07 10*6/uL (ref 3.70–5.45)
RDW: 15.6 % — ABNORMAL HIGH (ref 11.2–14.5)
WBC: 7.4 10*3/uL (ref 3.9–10.3)

## 2013-01-27 MED ORDER — DARBEPOETIN ALFA-POLYSORBATE 300 MCG/0.6ML IJ SOLN
300.0000 ug | Freq: Once | INTRAMUSCULAR | Status: AC
  Administered 2013-01-27: 300 ug via SUBCUTANEOUS
  Filled 2013-01-27: qty 0.6

## 2013-02-09 DIAGNOSIS — B351 Tinea unguium: Secondary | ICD-10-CM | POA: Diagnosis not present

## 2013-02-09 DIAGNOSIS — M79609 Pain in unspecified limb: Secondary | ICD-10-CM | POA: Diagnosis not present

## 2013-02-23 ENCOUNTER — Other Ambulatory Visit: Payer: Self-pay | Admitting: *Deleted

## 2013-02-23 ENCOUNTER — Other Ambulatory Visit: Payer: Self-pay | Admitting: Endocrinology

## 2013-02-23 MED ORDER — DOXAZOSIN MESYLATE 2 MG PO TABS: 2.0000 mg | ORAL_TABLET | Freq: Every day | ORAL | Status: DC

## 2013-02-23 MED ORDER — IRBESARTAN 150 MG PO TABS: 150.0000 mg | ORAL_TABLET | Freq: Every day | ORAL | Status: DC

## 2013-02-24 ENCOUNTER — Ambulatory Visit (HOSPITAL_BASED_OUTPATIENT_CLINIC_OR_DEPARTMENT_OTHER): Payer: Medicare Other

## 2013-02-24 ENCOUNTER — Other Ambulatory Visit (HOSPITAL_BASED_OUTPATIENT_CLINIC_OR_DEPARTMENT_OTHER): Payer: Medicare Other

## 2013-02-24 VITALS — BP 127/64 | HR 53 | Temp 97.7°F

## 2013-02-24 DIAGNOSIS — D631 Anemia in chronic kidney disease: Secondary | ICD-10-CM | POA: Diagnosis not present

## 2013-02-24 DIAGNOSIS — N039 Chronic nephritic syndrome with unspecified morphologic changes: Secondary | ICD-10-CM

## 2013-02-24 DIAGNOSIS — N189 Chronic kidney disease, unspecified: Secondary | ICD-10-CM

## 2013-02-24 DIAGNOSIS — D649 Anemia, unspecified: Secondary | ICD-10-CM

## 2013-02-24 LAB — CBC WITH DIFFERENTIAL/PLATELET
BASO%: 0.8 % (ref 0.0–2.0)
BASOS ABS: 0.1 10*3/uL (ref 0.0–0.1)
EOS ABS: 0.1 10*3/uL (ref 0.0–0.5)
EOS%: 1.6 % (ref 0.0–7.0)
HEMATOCRIT: 32.6 % — AB (ref 34.8–46.6)
HEMOGLOBIN: 10.1 g/dL — AB (ref 11.6–15.9)
LYMPH%: 21.1 % (ref 14.0–49.7)
MCH: 24.2 pg — ABNORMAL LOW (ref 25.1–34.0)
MCHC: 30.9 g/dL — ABNORMAL LOW (ref 31.5–36.0)
MCV: 78.4 fL — AB (ref 79.5–101.0)
MONO#: 0.6 10*3/uL (ref 0.1–0.9)
MONO%: 8.6 % (ref 0.0–14.0)
NEUT%: 67.9 % (ref 38.4–76.8)
NEUTROS ABS: 5 10*3/uL (ref 1.5–6.5)
Platelets: 217 10*3/uL (ref 145–400)
RBC: 4.16 10*6/uL (ref 3.70–5.45)
RDW: 15.5 % — AB (ref 11.2–14.5)
WBC: 7.3 10*3/uL (ref 3.9–10.3)
lymph#: 1.5 10*3/uL (ref 0.9–3.3)

## 2013-02-24 MED ORDER — DARBEPOETIN ALFA-POLYSORBATE 300 MCG/0.6ML IJ SOLN
300.0000 ug | Freq: Once | INTRAMUSCULAR | Status: AC
  Administered 2013-02-24: 300 ug via SUBCUTANEOUS
  Filled 2013-02-24: qty 0.6

## 2013-02-27 DIAGNOSIS — R259 Unspecified abnormal involuntary movements: Secondary | ICD-10-CM | POA: Diagnosis not present

## 2013-02-27 DIAGNOSIS — K5900 Constipation, unspecified: Secondary | ICD-10-CM | POA: Diagnosis not present

## 2013-02-27 DIAGNOSIS — Z8673 Personal history of transient ischemic attack (TIA), and cerebral infarction without residual deficits: Secondary | ICD-10-CM | POA: Diagnosis not present

## 2013-02-27 DIAGNOSIS — M1990 Unspecified osteoarthritis, unspecified site: Secondary | ICD-10-CM | POA: Diagnosis not present

## 2013-02-27 DIAGNOSIS — E119 Type 2 diabetes mellitus without complications: Secondary | ICD-10-CM | POA: Diagnosis not present

## 2013-02-27 DIAGNOSIS — C50919 Malignant neoplasm of unspecified site of unspecified female breast: Secondary | ICD-10-CM | POA: Diagnosis not present

## 2013-02-27 DIAGNOSIS — I10 Essential (primary) hypertension: Secondary | ICD-10-CM | POA: Diagnosis not present

## 2013-03-03 ENCOUNTER — Ambulatory Visit: Payer: Medicare Other | Admitting: Endocrinology

## 2013-03-03 ENCOUNTER — Other Ambulatory Visit: Payer: Medicare Other

## 2013-03-10 ENCOUNTER — Ambulatory Visit: Payer: Medicare Other | Admitting: Endocrinology

## 2013-03-10 ENCOUNTER — Other Ambulatory Visit: Payer: Medicare Other

## 2013-03-15 ENCOUNTER — Other Ambulatory Visit: Payer: Self-pay | Admitting: *Deleted

## 2013-03-15 MED ORDER — INDAPAMIDE 1.25 MG PO TABS: 1.2500 mg | ORAL_TABLET | ORAL | Status: DC

## 2013-03-16 ENCOUNTER — Ambulatory Visit: Payer: Medicare Other | Admitting: Endocrinology

## 2013-03-16 ENCOUNTER — Other Ambulatory Visit: Payer: Medicare Other

## 2013-03-21 ENCOUNTER — Encounter: Payer: Self-pay | Admitting: Endocrinology

## 2013-03-21 ENCOUNTER — Ambulatory Visit (INDEPENDENT_AMBULATORY_CARE_PROVIDER_SITE_OTHER): Payer: Medicare Other | Admitting: Endocrinology

## 2013-03-21 ENCOUNTER — Other Ambulatory Visit (INDEPENDENT_AMBULATORY_CARE_PROVIDER_SITE_OTHER): Payer: Medicare Other

## 2013-03-21 VITALS — BP 140/62 | HR 55 | Temp 98.3°F | Resp 16

## 2013-03-21 DIAGNOSIS — E039 Hypothyroidism, unspecified: Secondary | ICD-10-CM

## 2013-03-21 DIAGNOSIS — E1165 Type 2 diabetes mellitus with hyperglycemia: Secondary | ICD-10-CM

## 2013-03-21 DIAGNOSIS — I10 Essential (primary) hypertension: Secondary | ICD-10-CM

## 2013-03-21 DIAGNOSIS — IMO0001 Reserved for inherently not codable concepts without codable children: Secondary | ICD-10-CM

## 2013-03-21 LAB — LIPID PANEL
CHOLESTEROL: 92 mg/dL (ref 0–200)
HDL: 46 mg/dL (ref >39.00)
LDL CALC: 38 mg/dL (ref 0–99)
TRIGLYCERIDES: 41 mg/dL (ref 0.0–149.0)
Total CHOL/HDL Ratio: 2
VLDL: 8.2 mg/dL (ref 0.0–40.0)

## 2013-03-21 LAB — COMPREHENSIVE METABOLIC PANEL
ALT: 14 U/L (ref 0–35)
AST: 15 U/L (ref 0–37)
Albumin: 3.2 g/dL — ABNORMAL LOW (ref 3.5–5.2)
Alkaline Phosphatase: 80 U/L (ref 39–117)
BILIRUBIN TOTAL: 0.6 mg/dL (ref 0.3–1.2)
BUN: 34 mg/dL — AB (ref 6–23)
CO2: 28 mEq/L (ref 19–32)
Calcium: 9.1 mg/dL (ref 8.4–10.5)
Chloride: 101 mEq/L (ref 96–112)
Creatinine, Ser: 1.3 mg/dL — ABNORMAL HIGH (ref 0.4–1.2)
GFR: 51.08 mL/min — ABNORMAL LOW (ref >60.00)
GLUCOSE: 133 mg/dL — AB (ref 70–99)
Potassium: 4.1 mEq/L (ref 3.5–5.1)
Sodium: 137 mEq/L (ref 135–145)
Total Protein: 6.3 g/dL (ref 6.0–8.3)

## 2013-03-21 LAB — TSH: TSH: 0.31 u[IU]/mL — ABNORMAL LOW (ref 0.35–5.50)

## 2013-03-21 LAB — HEMOGLOBIN A1C: Hgb A1c MFr Bld: 5.3 % (ref 4.6–6.5)

## 2013-03-21 LAB — T4, FREE: FREE T4: 1.55 ng/dL (ref 0.60–1.60)

## 2013-03-21 MED ORDER — LEVOTHYROXINE SODIUM 100 MCG PO TABS: 100.0000 ug | ORAL_TABLET | Freq: Every day | ORAL | Status: DC

## 2013-03-21 NOTE — Progress Notes (Signed)
Patient ID: [name], female   DOB: 10-01-1930, 80 y.o.   MRN: [id number]   Reason for Appointment: Diabetes follow-up   History of Present Illness   Diagnosis: Type 2 DIABETES MELITUS, long-standing  She has been on insulin for several years and most of the time has been on NPH or premixed  insulin for convenience and simplicity In 2013 because of hypoglycemia overnight her evening insulin was stopped Probably because of decreased intake and weight loss her insulin requirement has been progressively less Recent sugars: Although she had several readings over 200 but then 2/16 and 2/23 her blood sugars recently appear to be improved. She is using 2 different glucose monitors She generally compliant with taking her insulin before breakfast daily Again is taking a blood sugar up to 4 times a day including some during the night Hyperglycemia: This occurs sporadically  at different times Hypoglycemia: None with the lowest reading 79 on 2/28 at 1:30 p.m. Insulin regimen: Humulin 70/30: ? 22 units in the morning               Monitors blood glucose: Once a day.    Glucometer: One Touch.          Blood Glucose readings from meter download:   PREMEAL  morning   midday   afternoon   p.m.   overnight   Glucose range:  114-277   25-272   108-187  163-283   121-238   Mean/median:     186   169    Meals:  2-3 meals per day, variable times.           Wt Readings from Last 3 Encounters:  12/30/12 186 lb 12.8 oz (84.732 kg)  11/28/12 192 lb 9.6 oz (87.363 kg)  10/07/12 190 lb (86.183 kg)   Lab Results  Component Value Date   HGBA1C 5.3 03/21/2013   HGBA1C 5.3 08/29/2012   HGBA1C  Value: 8.0 (NOTE) The ADA recommends the following therapeutic goal for glycemic control related to Hgb A1c measurement: Goal of therapy: <6.5 Hgb A1c  Reference: American Diabetes Association: Clinical Practice Recommendations 2010, Diabetes Care, 2010, 33: (Suppl  1).* 06/09/2008   Lab Results  Component Value  Date   MICROALBUR 8.4* 08/29/2012   LDLCALC 38 03/21/2013   CREATININE 1.3* 03/21/2013        Medication List       This list is accurate as of: 03/21/13  3:02 PM.  Always use your most recent med list.               B-complex with vitamin C tablet  Take 1 tablet by mouth daily.     CALCIUM 600 600 MG Tabs tablet  Generic drug:  calcium carbonate  Take 600 mg by mouth 2 (two) times daily with a meal.     diazepam 5 MG tablet  Commonly known as:  VALIUM  Take 5 mg by mouth every 6 (six) hours as needed.     doxazosin 2 MG tablet  Commonly known as:  CARDURA  Take 1 tablet (2 mg total) by mouth at bedtime.     ferrous gluconate 325 MG tablet  Commonly known as:  FERGON  Take 325 mg by mouth daily with breakfast.     HUMULIN 70/30 (70-30) 100 UNIT/ML injection  Generic drug:  insulin NPH-regular Human  28 Units.     Hydrocodone-Acetaminophen 10-660 MG Tabs  10-325 mg.     indapamide 1.25 MG tablet  Commonly  known as:  LOZOL  Take 1 tablet (1.25 mg total) by mouth every morning.     INSULIN SYRINGE 1CC/29G 29G X 1/2" 1 ML Misc  by Does not apply route.     irbesartan 150 MG tablet  Commonly known as:  AVAPRO  Take 1 tablet (150 mg total) by mouth daily.     IRON PO  Take 65 mg by mouth daily.     letrozole 2.5 MG tablet  Commonly known as:  FEMARA  Take 1 tablet (2.5 mg total) by mouth daily.     levothyroxine 112 MCG tablet  Commonly known as:  SYNTHROID, LEVOTHROID  Take 1 tablet (112 mcg total) by mouth daily before breakfast.     metoprolol succinate 50 MG 24 hr tablet  Commonly known as:  TOPROL-XL     PERIDIN-C PO  Take by mouth.     potassium chloride SA 20 MEQ tablet  Commonly known as:  K-DUR,KLOR-CON  TAKE ONE TABLET BY MOUTH ONCE DAILY     rOPINIRole 3 MG tablet  Commonly known as:  REQUIP     traZODone 100 MG tablet  Commonly known as:  DESYREL     vitamin C 1000 MG tablet  Take 1,000 mg by mouth daily.     Vitamin D 2000 UNITS  tablet  Take 2,000 Units by mouth daily.        Allergies:  Allergies  Allergen Reactions  . Aspirin   . Insulins     NOVA?  Marland Kitchen Penicillins     Past Medical History  Diagnosis Date  . Hemorrhoid   . Anemia   . Arthritis     Past Surgical History  Procedure Laterality Date  . Breast lumpectomy      LEFT  . Abdominal hysterectomy      PARTIAL  . Mastectomy  05/2008    Left    No family history on file.  Social History:  reports that she has never smoked. She does not have any smokeless tobacco history on file. She reports that she does not drink alcohol. Her drug history is not on file.  Review of Systems:  HYPERTENSION: This is long-standing and has been well controlled, still on multiple medications  HYPOTHYROIDISM: She has had long-standing hypothyroidism and the dose has been stable   Lab Results  Component Value Date   TSH 0.31* 03/21/2013   TSH 0.47 08/29/2012     She has had leg weakness and is in a wheelchair  Has a history of  anemia  No history of hypercholesterolemia  Lab Results  Component Value Date   CHOL 92 03/21/2013   HDL 46.00 03/21/2013   LDLCALC 38 03/21/2013   TRIG 41.0 03/21/2013   CHOLHDL 2 03/21/2013    History of breast cancer, followed by oncologist     Examination:   BP 140/62  Pulse 55  Temp(Src) 98.3 F (36.8 C)  Resp 16  SpO2 97%  Cannot calculate BMI with a height equal to zero.   Repeat blood pressure 140/62 No thyroid enlargement felt Heart sounds normal No pedal edema  ASSESSMENT/ PLAN::   Diabetes type 2:   The patient's diabetes control appears to be overall good especially recently Her but sugar targets are relatively flexible considering her age and multiple medical problems She is still having overall fairly good control with only one injection of 70/30 insulin in the morning She was previously having higher readings possibly from a respiratory infection but recent readings  are fairly good  HYPERTENSION:  Blood pressure is well-controlled She has had a recent history of renal dysfunction and will recheck her creatinine  HYPOTHYROIDISM: Difficult to judge her symptoms and will check her TSH again  Anemia: Followed by hematologist   [facility] 03/21/2013, 3:02 PM    Addendum: TSH is relatively low and she will switch to Synthroid 100 mcg

## 2013-03-24 ENCOUNTER — Other Ambulatory Visit (HOSPITAL_BASED_OUTPATIENT_CLINIC_OR_DEPARTMENT_OTHER): Payer: Medicare Other

## 2013-03-24 ENCOUNTER — Ambulatory Visit: Payer: Medicare Other

## 2013-03-24 ENCOUNTER — Encounter: Payer: Self-pay | Admitting: *Deleted

## 2013-03-24 DIAGNOSIS — N189 Chronic kidney disease, unspecified: Secondary | ICD-10-CM

## 2013-03-24 DIAGNOSIS — D631 Anemia in chronic kidney disease: Secondary | ICD-10-CM

## 2013-03-24 DIAGNOSIS — N039 Chronic nephritic syndrome with unspecified morphologic changes: Secondary | ICD-10-CM

## 2013-03-24 DIAGNOSIS — D649 Anemia, unspecified: Secondary | ICD-10-CM

## 2013-03-24 LAB — CBC WITH DIFFERENTIAL/PLATELET
BASO%: 0.9 % (ref 0.0–2.0)
Basophils Absolute: 0.1 10*3/uL (ref 0.0–0.1)
EOS ABS: 0.2 10*3/uL (ref 0.0–0.5)
EOS%: 2.7 % (ref 0.0–7.0)
HCT: 36 % (ref 34.8–46.6)
HEMOGLOBIN: 11 g/dL — AB (ref 11.6–15.9)
LYMPH#: 1.7 10*3/uL (ref 0.9–3.3)
LYMPH%: 28.4 % (ref 14.0–49.7)
MCH: 23.8 pg — ABNORMAL LOW (ref 25.1–34.0)
MCHC: 30.4 g/dL — ABNORMAL LOW (ref 31.5–36.0)
MCV: 78.1 fL — AB (ref 79.5–101.0)
MONO#: 0.5 10*3/uL (ref 0.1–0.9)
MONO%: 8.5 % (ref 0.0–14.0)
NEUT#: 3.5 10*3/uL (ref 1.5–6.5)
NEUT%: 59.5 % (ref 38.4–76.8)
Platelets: 205 10*3/uL (ref 145–400)
RBC: 4.61 10*6/uL (ref 3.70–5.45)
RDW: 15.2 % — AB (ref 11.2–14.5)
WBC: 5.9 10*3/uL (ref 3.9–10.3)

## 2013-03-24 MED ORDER — DARBEPOETIN ALFA-POLYSORBATE 300 MCG/0.6ML IJ SOLN: 300.0000 ug | Freq: Once | INTRAMUSCULAR | Status: DC

## 2013-03-24 NOTE — Progress Notes (Signed)
[place] Work  Clinical Social Work checked in on pt at her lab appointment. She reports to be doing well and was glad to see CSW. Pt here with her ride arranged through senior resources. They had some scheduling questions and will check with scheduling on the way out today. No other needs noted.   Loren Racer, LCSW Clinical Social Worker [facility] Wednesday, Thursday and Friday Phone: [phone] Fax: [phone]

## 2013-04-13 DIAGNOSIS — M79609 Pain in unspecified limb: Secondary | ICD-10-CM | POA: Diagnosis not present

## 2013-04-13 DIAGNOSIS — B351 Tinea unguium: Secondary | ICD-10-CM | POA: Diagnosis not present

## 2013-04-17 ENCOUNTER — Other Ambulatory Visit: Payer: Self-pay | Admitting: Oncology

## 2013-04-27 DIAGNOSIS — M1710 Unilateral primary osteoarthritis, unspecified knee: Secondary | ICD-10-CM | POA: Diagnosis not present

## 2013-05-05 ENCOUNTER — Telehealth: Payer: Self-pay | Admitting: Oncology

## 2013-05-05 ENCOUNTER — Other Ambulatory Visit: Payer: Self-pay | Admitting: Oncology

## 2013-05-05 NOTE — Telephone Encounter (Signed)
per 4/17 pof moved 4/24 appt lb/fu/inj to 1-3 weeks out to make room for acute pt. appts moved to 5/6 @ 1pm. called pt but was not able to reach her or lve message. called dtr eleanor re change and new appt d/t. schedule mailed. called lauren (SW) but she was out s/w grier informig her that appt was moved and lauren assists this pt w/her appts and transportation. grier will forward messsage to lauren.

## 2013-05-12 ENCOUNTER — Ambulatory Visit: Payer: Medicare Other | Admitting: Oncology

## 2013-05-12 ENCOUNTER — Other Ambulatory Visit: Payer: Medicare Other

## 2013-05-12 ENCOUNTER — Ambulatory Visit: Payer: Medicare Other

## 2013-05-21 ENCOUNTER — Other Ambulatory Visit: Payer: Self-pay | Admitting: Oncology

## 2013-05-24 ENCOUNTER — Telehealth: Payer: Self-pay | Admitting: Oncology

## 2013-05-24 ENCOUNTER — Encounter: Payer: Self-pay | Admitting: *Deleted

## 2013-05-24 ENCOUNTER — Encounter: Payer: Self-pay | Admitting: Oncology

## 2013-05-24 ENCOUNTER — Other Ambulatory Visit (HOSPITAL_BASED_OUTPATIENT_CLINIC_OR_DEPARTMENT_OTHER): Payer: Medicare Other

## 2013-05-24 ENCOUNTER — Ambulatory Visit (HOSPITAL_BASED_OUTPATIENT_CLINIC_OR_DEPARTMENT_OTHER): Payer: Medicare Other | Admitting: Oncology

## 2013-05-24 ENCOUNTER — Ambulatory Visit (HOSPITAL_BASED_OUTPATIENT_CLINIC_OR_DEPARTMENT_OTHER): Payer: Medicare Other

## 2013-05-24 VITALS — BP 120/68 | HR 51 | Temp 98.0°F | Resp 17 | Ht 66.0 in | Wt 187.8 lb

## 2013-05-24 DIAGNOSIS — E119 Type 2 diabetes mellitus without complications: Secondary | ICD-10-CM

## 2013-05-24 DIAGNOSIS — M899 Disorder of bone, unspecified: Secondary | ICD-10-CM | POA: Diagnosis not present

## 2013-05-24 DIAGNOSIS — N039 Chronic nephritic syndrome with unspecified morphologic changes: Secondary | ICD-10-CM

## 2013-05-24 DIAGNOSIS — D638 Anemia in other chronic diseases classified elsewhere: Secondary | ICD-10-CM

## 2013-05-24 DIAGNOSIS — D631 Anemia in chronic kidney disease: Secondary | ICD-10-CM | POA: Diagnosis not present

## 2013-05-24 DIAGNOSIS — D649 Anemia, unspecified: Secondary | ICD-10-CM

## 2013-05-24 DIAGNOSIS — C50912 Malignant neoplasm of unspecified site of left female breast: Secondary | ICD-10-CM

## 2013-05-24 DIAGNOSIS — N189 Chronic kidney disease, unspecified: Secondary | ICD-10-CM | POA: Diagnosis not present

## 2013-05-24 DIAGNOSIS — M949 Disorder of cartilage, unspecified: Secondary | ICD-10-CM

## 2013-05-24 DIAGNOSIS — C50419 Malignant neoplasm of upper-outer quadrant of unspecified female breast: Secondary | ICD-10-CM

## 2013-05-24 LAB — CBC WITH DIFFERENTIAL/PLATELET
BASO%: 0.6 % (ref 0.0–2.0)
BASOS ABS: 0 10*3/uL (ref 0.0–0.1)
EOS ABS: 0.2 10*3/uL (ref 0.0–0.5)
EOS%: 3.6 % (ref 0.0–7.0)
HCT: 28.7 % — ABNORMAL LOW (ref 34.8–46.6)
HEMOGLOBIN: 9 g/dL — AB (ref 11.6–15.9)
LYMPH%: 24.7 % (ref 14.0–49.7)
MCH: 23.8 pg — ABNORMAL LOW (ref 25.1–34.0)
MCHC: 31.2 g/dL — ABNORMAL LOW (ref 31.5–36.0)
MCV: 76.3 fL — ABNORMAL LOW (ref 79.5–101.0)
MONO#: 0.6 10*3/uL (ref 0.1–0.9)
MONO%: 8.3 % (ref 0.0–14.0)
NEUT%: 62.8 % (ref 38.4–76.8)
NEUTROS ABS: 4.2 10*3/uL (ref 1.5–6.5)
Platelets: 198 10*3/uL (ref 145–400)
RBC: 3.76 10*6/uL (ref 3.70–5.45)
RDW: 15.2 % — AB (ref 11.2–14.5)
WBC: 6.7 10*3/uL (ref 3.9–10.3)
lymph#: 1.7 10*3/uL (ref 0.9–3.3)

## 2013-05-24 MED ORDER — DARBEPOETIN ALFA-POLYSORBATE 300 MCG/0.6ML IJ SOLN
300.0000 ug | Freq: Once | INTRAMUSCULAR | Status: AC
  Administered 2013-05-24: 300 ug via SUBCUTANEOUS
  Filled 2013-05-24: qty 0.6

## 2013-05-24 NOTE — Telephone Encounter (Signed)
checked sch for pt

## 2013-05-24 NOTE — Telephone Encounter (Signed)
, °

## 2013-05-24 NOTE — Progress Notes (Signed)
OFFICE PROGRESS NOTE   05/24/2013   Physicians:[name] (PCP), [name], H.[name]   INTERVAL HISTORY:  Patient is seen, alone for visit, in coninuing attention to anemia related to chronic kidney disease and chronic disease, and history of T1N1 ER/PR + HER 2 negative left breast cancer. She recieves aranesp monthly for hemoglobin <11, which was not needed in Dec 2014 or in March 2015. She completes 5 years of Femara in June of this year.  Patient has multiple other significant comorbidities including diabetes on insulin with apparent diabetic retinopathy, and very limited mobility due to degenerative arthritis. Baseline creatinine has been ~ 1.3. She stays alone tho has family members locally; she receives Meals on Wheels. [facility] SW assists with transportation for visits.  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 Femara begun ~ June 2010.      Breast cancer, left breast   10/07/2012 Initial Diagnosis Breast cancer, left breast  Patient was diagnosed with multicentric invasive ductal carcinoma of left breast after abnormality found on screening mammogram 05-18-2008. This was intermediate grade, ER positive 100%, PR + 70%, HER 2 negative. She had mastectomy with sentinel node evaluation, with 2 separate lesions, one 1.7 cm and the other 0.7 cm, margins clear and 2 sentinel nodes involved + one additional node negative. Patient declined radiation but has been on Femara since ~ June 2010. [name] has seen her ~ every 6 months. In this EMR she has reports of CXR in 2010 and CT head in 03-2010 (refused staging studies at diagnosis).    Review of systems as above, also: She is not SOB with minimal activiey, no chest pain. No bleeding. No new or different pain. Appetite reasonable. Bowels move daily with stool softeners tho still  with some difficulty; we discussed changes for this including increase in stool softeners, which she prefers not to do.  Remainder of 10 point Review of Systems negative.  Objective:  Vital signs in last 24 hours:  BP 120/68  Pulse 51  Temp(Src) 98 F (36.7 C) (Oral)  Resp 17  Ht 5\' 6"  (1.676 m)  Wt 187 lb 12.8 oz (85.186 kg)  BMI 30.33 kg/m2  SpO2 98%  Alert, oriented and appropriate. In wheelchair. Respirations not labored RA. HEENT:PERRL, sclerae not icteric. Oral mucosa moist without lesions, posterior pharynx clear.  Neck supple. No JVD.  Lymphatics:no cervical,suraclavicular, axillary adenopathy Resp: clear to auscultation bilaterally  Cardio: regular rate and rhythm. No gallop. GI: obese, soft, nontender, not distended, no appreciable mass or organomegaly. Normally active bowel sounds.  Musculoskeletal/ Extremities: trace pedal edema, no cords or tenderness Neuro: seems stable on exam just in [facility] Skin without rash, ecchymosis, petechiae Breasts: Left mastecomy scar not remarkable. Right without dominant mass, skin or nipple findings. Axillae benign.  Lab Results:  Results for orders placed in visit on 05/24/13  CBC WITH DIFFERENTIAL      Result Value Ref Range   WBC 6.7  3.9 - 10.3 10e3/uL   NEUT# 4.2  1.5 - 6.5 10e3/uL   HGB 9.0 (*) 11.6 - 15.9 g/dL   HCT 28.7 (*) 34.8 - 46.6 %   Platelets 198  145 - 400 10e3/uL   MCV 76.3 (*) 79.5 - 101.0 fL   MCH 23.8 (*) 25.1 - 34.0 pg   MCHC 31.2 (*) 31.5 - 36.0 g/dL  RBC 3.76  3.70 - 5.45 10e6/uL   RDW 15.2 (*) 11.2 - 14.5 %   lymph# 1.7  0.9 - 3.3 10e3/uL   MONO# 0.6  0.1 - 0.9 10e3/uL   Eosinophils Absolute 0.2  0.0 - 0.5 10e3/uL   Basophils Absolute 0.0  0.0 - 0.1 10e3/uL   NEUT% 62.8  38.4 - 76.8 %   LYMPH% 24.7  14.0 - 49.7 %   MONO% 8.3  0.0 - 14.0 %   EOS% 3.6  0.0 - 7.0 %   BASO% 0.6  0.0 - 2.0 %     Studies/Results:  DEXA at [facility] 06-2012 was osteopenic, lowest T score -1.8, which was significantly  lower than 2 years prior.  Medications: I have reviewed the patient's current medications. She will complete 5 years of Femara in June 2015.  Given bone density effects of aromatase inhibitors and fact that treatment longer than 5 years is not known more effective than stopping at 5 years, I believe best to stop the Femara around June of this year.  Assessment/Plan: 1. Left breast cancer T1N1 ER/PR +, HER 2 negative, 2 sentinel nodes involved: out almost 4 years from diagnosis, post mastectomy with 3 node axillary evaluation. She will complete 5 years of Femara in June 2015 and we will follow on observation from there. 2.osteopenia progressive on aromatase inhibitor. She does have calcium bid on medications now 3.anemia secondary to chronic kidney disease and chronic disease: Aranesp monthly if hgb <11. 4.degenerative arthritis, very limited mobility. She does walk with walker in home. 5.poor visual acuity  6.chronic tremor  7.diabetes on insulin: followed by Courtney [name]  8.strong [place] heart disease    Courtney Levan, MD   05/24/2013, 3:05 PM

## 2013-05-24 NOTE — Progress Notes (Signed)
Clinical Social Work met with patient after medical oncology visit.  CSW assisted patient at scheduling and will mail patient copy of calendar in large font.    Polo Riley, MSW, LCSW, OSW-C Clinical Social Worker [facility] [phone]

## 2013-05-29 ENCOUNTER — Telehealth: Payer: Self-pay

## 2013-05-29 NOTE — Telephone Encounter (Signed)
       Message Received: Today     Baruch Merl, RN Prentiss Bells, RN            Previous Messages      ----- Message -----  From: Gordy Levan, MD  Sent: 05/28/2013 3:11 PM  To: Baruch Merl, RN, Hutto, RN   No refills on letrozole needed - she has finished 5 years.  Please let patient know

## 2013-05-29 NOTE — Telephone Encounter (Signed)
Per LL note, let patient know she has completed her 5 years and no longer needs to take the Femara.  Sent pt calendar.  Pt voiced understanding.

## 2013-05-31 ENCOUNTER — Other Ambulatory Visit: Payer: Self-pay | Admitting: Endocrinology

## 2013-06-01 ENCOUNTER — Other Ambulatory Visit: Payer: Self-pay | Admitting: *Deleted

## 2013-06-01 MED ORDER — INSULIN NPH ISOPHANE & REGULAR (70-30) 100 UNIT/ML ~~LOC~~ SUSP: 28.0000 [IU] | Freq: Every day | SUBCUTANEOUS | Status: DC

## 2013-06-07 ENCOUNTER — Other Ambulatory Visit: Payer: Self-pay

## 2013-06-07 DIAGNOSIS — Z1231 Encounter for screening mammogram for malignant neoplasm of breast: Secondary | ICD-10-CM

## 2013-06-07 DIAGNOSIS — Z853 Personal history of malignant neoplasm of breast: Secondary | ICD-10-CM

## 2013-06-07 DIAGNOSIS — Z9010 Acquired absence of unspecified breast and nipple: Secondary | ICD-10-CM

## 2013-06-15 DIAGNOSIS — B351 Tinea unguium: Secondary | ICD-10-CM | POA: Diagnosis not present

## 2013-06-15 DIAGNOSIS — M79609 Pain in unspecified limb: Secondary | ICD-10-CM | POA: Diagnosis not present

## 2013-06-21 ENCOUNTER — Encounter: Payer: Self-pay | Admitting: Endocrinology

## 2013-06-21 ENCOUNTER — Other Ambulatory Visit (INDEPENDENT_AMBULATORY_CARE_PROVIDER_SITE_OTHER): Payer: Medicare Other

## 2013-06-21 ENCOUNTER — Ambulatory Visit (INDEPENDENT_AMBULATORY_CARE_PROVIDER_SITE_OTHER): Payer: Medicare Other | Admitting: Endocrinology

## 2013-06-21 VITALS — BP 112/52 | HR 60 | Temp 98.1°F | Resp 14

## 2013-06-21 DIAGNOSIS — E1165 Type 2 diabetes mellitus with hyperglycemia: Principal | ICD-10-CM

## 2013-06-21 DIAGNOSIS — IMO0001 Reserved for inherently not codable concepts without codable children: Secondary | ICD-10-CM

## 2013-06-21 DIAGNOSIS — E039 Hypothyroidism, unspecified: Secondary | ICD-10-CM | POA: Diagnosis not present

## 2013-06-21 DIAGNOSIS — N182 Chronic kidney disease, stage 2 (mild): Secondary | ICD-10-CM

## 2013-06-21 DIAGNOSIS — I10 Essential (primary) hypertension: Secondary | ICD-10-CM | POA: Diagnosis not present

## 2013-06-21 LAB — COMPREHENSIVE METABOLIC PANEL
ALBUMIN: 3.3 g/dL — AB (ref 3.5–5.2)
ALT: 11 U/L (ref 0–35)
AST: 15 U/L (ref 0–37)
Alkaline Phosphatase: 77 U/L (ref 39–117)
BUN: 30 mg/dL — ABNORMAL HIGH (ref 6–23)
CALCIUM: 9.1 mg/dL (ref 8.4–10.5)
CHLORIDE: 103 meq/L (ref 96–112)
CO2: 31 meq/L (ref 19–32)
Creatinine, Ser: 1.3 mg/dL — ABNORMAL HIGH (ref 0.4–1.2)
GFR: 52.45 mL/min — AB (ref >60.00)
Glucose, Bld: 82 mg/dL (ref 70–99)
POTASSIUM: 4 meq/L (ref 3.5–5.1)
SODIUM: 140 meq/L (ref 135–145)
Total Bilirubin: 0.7 mg/dL (ref 0.2–1.2)
Total Protein: 6.2 g/dL (ref 6.0–8.3)

## 2013-06-21 LAB — TSH: TSH: 0.37 u[IU]/mL (ref 0.35–4.50)

## 2013-06-21 LAB — HEMOGLOBIN A1C: HEMOGLOBIN A1C: 5.2 % (ref 4.6–6.5)

## 2013-06-21 LAB — T4, FREE: FREE T4: 1.35 ng/dL (ref 0.60–1.60)

## 2013-06-21 NOTE — Progress Notes (Signed)
Quick Note:  Thyroid level still a little high, reduce dose to 88 mcg from 100 mcg ______

## 2013-06-21 NOTE — Patient Instructions (Signed)
Do not check blood sugar right after eating, may check blood sugar about 2 hours after eating   avoid a lot of snacks with starch, have a protein with each snack

## 2013-06-21 NOTE — Progress Notes (Signed)
Patient ID: [name], female   DOB: December 13, 1930, 81 y.o.   MRN: [id number]   Reason for Appointment: Diabetes follow-up   History of Present Illness   Diagnosis: Type 2 DIABETES MELITUS, long-standing  She has been on insulin for several years and most of the time has been on NPH or premixed  insulin for convenience and simplicity In 3267 because of hypoglycemia overnight her evening insulin was stopped Probably because of decreased intake and weight loss her insulin requirement has been progressively less Recent sugars: Although she had several readings over 200 she now says that she will check her blood sugar soon after eating while she is still on the kitchen table  Because of her difficulty with understanding directions and keeping her regimen simple she has only been on once a day 70/30 insulin She generally compliant with taking her insulin before breakfast daily Again is taking a blood sugar over 4 times a day including some during the night Hyperglycemia: This occurs sporadically  at different times, mostly in the evenings after supper Hypoglycemia: None with the lowest reading 80 Insulin regimen: Humulin 70/30: ? 22 units in the morning before breakfast, usually compliant with his               Monitors blood glucose: Once a day.    Glucometer: One Touch.          Blood Glucose reading analysis from meter download:   PREMEAL Breakfast Lunch Dinner Bedtime Overall  Glucose range:  121-209   114-213   80- 339   86-157    Median:  140      148    POST-MEAL PC Breakfast PC Lunch PC Dinner  Glucose range:  158-223    156-230   Mean/median:    196    Meals:  2-3 meals per day, variable times.           Wt Readings from Last 3 Encounters:  05/24/13 187 lb 12.8 oz (85.186 kg)  12/30/12 186 lb 12.8 oz (84.732 kg)  11/28/12 192 lb 9.6 oz (87.363 kg)   Lab Results  Component Value Date   HGBA1C 5.3 03/21/2013   HGBA1C 5.3 08/29/2012   HGBA1C  Value: 8.0 (NOTE) The ADA  recommends the following therapeutic goal for glycemic control related to Hgb A1c measurement: Goal of therapy: <6.5 Hgb A1c  Reference: American Diabetes Association: Clinical Practice Recommendations 2010, Diabetes Care, 2010, 33: (Suppl  1).* 06/09/2008   Lab Results  Component Value Date   MICROALBUR 8.4* 08/29/2012   LDLCALC 38 03/21/2013   CREATININE 1.3* 03/21/2013        Medication List       This list is accurate as of: 06/21/13  1:48 PM.  Always use your most recent med list.               B-complex with vitamin C tablet  Take 1 tablet by mouth daily.     CALCIUM 600 600 MG Tabs tablet  Generic drug:  calcium carbonate  Take 600 mg by mouth 2 (two) times daily with a meal.     diazepam 5 MG tablet  Commonly known as:  VALIUM  Take 5 mg by mouth every 6 (six) hours as needed.     doxazosin 2 MG tablet  Commonly known as:  CARDURA  Take 1 tablet (2 mg total) by mouth at bedtime.     ferrous gluconate 325 MG tablet  Commonly known as:  [employer]  Take 325 mg by mouth daily with breakfast.     Hydrocodone-Acetaminophen 10-660 MG Tabs  10-325 mg.     indapamide 1.25 MG tablet  Commonly known as:  LOZOL  TAKE ONE TABLET BY MOUTH IN THE MORNING     insulin NPH-regular Human (70-30) 100 UNIT/ML injection  Commonly known as:  HUMULIN 70/30  Inject 28 Units into the skin daily with breakfast.     INSULIN SYRINGE 1CC/29G 29G X 1/2" 1 ML Misc  by Does not apply route.     irbesartan 150 MG tablet  Commonly known as:  AVAPRO  Take 1 tablet (150 mg total) by mouth daily.     IRON PO  Take 65 mg by mouth daily.     letrozole 2.5 MG tablet  Commonly known as:  FEMARA  TAKE ONE TABLET BY MOUTH ONCE DAILY     levothyroxine 100 MCG tablet  Commonly known as:  SYNTHROID, LEVOTHROID  Take 1 tablet (100 mcg total) by mouth daily before breakfast.     metoprolol succinate 50 MG 24 hr tablet  Commonly known as:  TOPROL-XL     PERIDIN-C PO  Take by mouth.      potassium chloride SA 20 MEQ tablet  Commonly known as:  K-DUR,KLOR-CON  TAKE ONE TABLET BY MOUTH ONCE DAILY     rOPINIRole 3 MG tablet  Commonly known as:  REQUIP     traZODone 100 MG tablet  Commonly known as:  DESYREL     vitamin C 1000 MG tablet  Take 1,000 mg by mouth daily.     Vitamin D 2000 UNITS tablet  Take 2,000 Units by mouth daily.        Allergies:  Allergies  Allergen Reactions  . Aspirin   . Insulins     NOVA?  Marland Kitchen Penicillins     Past Medical History  Diagnosis Date  . Hemorrhoid   . Anemia   . Arthritis     Past Surgical History  Procedure Laterality Date  . Breast lumpectomy      LEFT  . Abdominal hysterectomy      PARTIAL  . Mastectomy  05/2008    Left    No family history on file.  Social History:  reports that she has never smoked. She does not have any smokeless tobacco history on file. She reports that she does not drink alcohol. Her drug history is not on file.  Review of Systems:  HYPERTENSION: This is long-standing and has been well controlled, still on multiple medications  HYPOTHYROIDISM: She has had long-standing hypothyroidism and the dose has been reduced on her visit in 3/15  Lab Results  Component Value Date   TSH 0.31* 03/21/2013   TSH 0.47 08/29/2012   She has had leg weakness and is in a wheelchair but can use a walker at home  Has a history of  anemia  No history of hypercholesterolemia  Lab Results  Component Value Date   CHOL 92 03/21/2013   HDL 46.00 03/21/2013   LDLCALC 38 03/21/2013   TRIG 41.0 03/21/2013   CHOLHDL 2 03/21/2013    History of breast cancer, followed by oncologist     Examination:   BP 112/52  Pulse 60  Temp(Src) 98.1 F (36.7 C)  Resp 14  SpO2 96%  Cannot calculate BMI with a height equal to zero.   No clubbing or pallor. Heart sounds normal No pedal edema Exam shows normal monofilament sensation, absent pedal pulses, no  deformities or calluses  ASSESSMENT/ PLAN:   Diabetes type 2:    The patient's diabetes  has been managed conservatively with once a day premixed insulin A1c generally lower than expected for her blood sugar Her blood sugar targets are relatively flexible considering her age, long duration of diabetes and multiple medical problems She is still having overall fairly good control with only one injection of 70/30 insulin in the morning Some of her high readings or right after eating and discussed needing to wait at least 2 hours to check them Also discussed avoiding unbalanced meals or snacks carbohydrate, blood sugar targets up to 200 after meals and she will call if she has any hypoglycemia  Reassured patient that she does not have any neuropathic loss of sensation in her feet from diabetes Advised her not to go barefoot at home  HYPERTENSION: Blood pressure is well-controlled She has had a recent history of renal dysfunction and will recheck her creatinine, consider reducing antihypertensive medications and creatinine higher  HYPOTHYROIDISM: Today the will check her TSH again and avoid relatively low levels because of her age  Counseling time over 50% of today's 25 minute visit regarding above issues  Elayne Snare 06/21/2013, 1:48 PM    Addendum: A1c good, creatinine stable, TSH still low normal, reduce dose to 88 mcg   Appointment on 06/21/2013  Component Date Value Ref Range Status  . Hemoglobin A1C 06/21/2013 5.2  4.6 - 6.5 % Final   Glycemic Control Guidelines for People with Diabetes:Non Diabetic:  <6%Goal of Therapy: <7%Additional Action Suggested:  >8%   . Sodium 06/21/2013 140  135 - 145 mEq/L Final  . Potassium 06/21/2013 4.0  3.5 - 5.1 mEq/L Final  . Chloride 06/21/2013 103  96 - 112 mEq/L Final  . CO2 06/21/2013 31  19 - 32 mEq/L Final  . Glucose, Bld 06/21/2013 82  70 - 99 mg/dL Final  . BUN 06/21/2013 30* 6 - 23 mg/dL Final  . Creatinine, Ser 06/21/2013 1.3* 0.4 - 1.2 mg/dL Final  . Total Bilirubin 06/21/2013 0.7  0.2 - 1.2 mg/dL  Final  . Alkaline Phosphatase 06/21/2013 77  39 - 117 U/L Final  . AST 06/21/2013 15  0 - 37 U/L Final  . ALT 06/21/2013 11  0 - 35 U/L Final  . Total Protein 06/21/2013 6.2  6.0 - 8.3 g/dL Final  . Albumin 06/21/2013 3.3* 3.5 - 5.2 g/dL Final  . Calcium 06/21/2013 9.1  8.4 - 10.5 mg/dL Final  . GFR 06/21/2013 52.45* >60.00 mL/min Final  . TSH 06/21/2013 0.37  0.35 - 4.50 uIU/mL Final  . Free T4 06/21/2013 1.35  0.60 - 1.60 ng/dL Final

## 2013-06-22 ENCOUNTER — Other Ambulatory Visit: Payer: Self-pay

## 2013-06-22 DIAGNOSIS — D649 Anemia, unspecified: Secondary | ICD-10-CM

## 2013-06-23 ENCOUNTER — Other Ambulatory Visit (HOSPITAL_BASED_OUTPATIENT_CLINIC_OR_DEPARTMENT_OTHER): Payer: Medicare Other

## 2013-06-23 ENCOUNTER — Ambulatory Visit (HOSPITAL_BASED_OUTPATIENT_CLINIC_OR_DEPARTMENT_OTHER): Payer: Medicare Other

## 2013-06-23 VITALS — BP 124/71 | HR 50 | Temp 98.1°F

## 2013-06-23 DIAGNOSIS — D631 Anemia in chronic kidney disease: Secondary | ICD-10-CM

## 2013-06-23 DIAGNOSIS — C50419 Malignant neoplasm of upper-outer quadrant of unspecified female breast: Secondary | ICD-10-CM

## 2013-06-23 DIAGNOSIS — D649 Anemia, unspecified: Secondary | ICD-10-CM

## 2013-06-23 DIAGNOSIS — N189 Chronic kidney disease, unspecified: Secondary | ICD-10-CM

## 2013-06-23 DIAGNOSIS — N039 Chronic nephritic syndrome with unspecified morphologic changes: Secondary | ICD-10-CM

## 2013-06-23 LAB — CBC WITH DIFFERENTIAL/PLATELET
BASO%: 0.8 % (ref 0.0–2.0)
BASOS ABS: 0.1 10*3/uL (ref 0.0–0.1)
EOS ABS: 0.2 10*3/uL (ref 0.0–0.5)
EOS%: 3.4 % (ref 0.0–7.0)
HCT: 31.1 % — ABNORMAL LOW (ref 34.8–46.6)
HEMOGLOBIN: 9.6 g/dL — AB (ref 11.6–15.9)
LYMPH#: 1.6 10*3/uL (ref 0.9–3.3)
LYMPH%: 23.3 % (ref 14.0–49.7)
MCH: 24.2 pg — ABNORMAL LOW (ref 25.1–34.0)
MCHC: 30.7 g/dL — ABNORMAL LOW (ref 31.5–36.0)
MCV: 78.7 fL — ABNORMAL LOW (ref 79.5–101.0)
MONO#: 0.6 10*3/uL (ref 0.1–0.9)
MONO%: 8.4 % (ref 0.0–14.0)
NEUT%: 64.1 % (ref 38.4–76.8)
NEUTROS ABS: 4.5 10*3/uL (ref 1.5–6.5)
Platelets: 211 10*3/uL (ref 145–400)
RBC: 3.96 10*6/uL (ref 3.70–5.45)
RDW: 15.1 % — ABNORMAL HIGH (ref 11.2–14.5)
WBC: 7 10*3/uL (ref 3.9–10.3)

## 2013-06-23 MED ORDER — DARBEPOETIN ALFA-POLYSORBATE 300 MCG/0.6ML IJ SOLN
300.0000 ug | Freq: Once | INTRAMUSCULAR | Status: AC
  Administered 2013-06-23: 300 ug via SUBCUTANEOUS
  Filled 2013-06-23: qty 0.6

## 2013-06-26 ENCOUNTER — Other Ambulatory Visit: Payer: Self-pay | Admitting: Endocrinology

## 2013-06-27 ENCOUNTER — Other Ambulatory Visit: Payer: Self-pay | Admitting: *Deleted

## 2013-06-27 MED ORDER — LEVOTHYROXINE SODIUM 88 MCG PO TABS: 88.0000 ug | ORAL_TABLET | Freq: Every day | ORAL | Status: DC

## 2013-06-28 ENCOUNTER — Telehealth: Payer: Self-pay | Admitting: Endocrinology

## 2013-06-28 ENCOUNTER — Other Ambulatory Visit: Payer: Self-pay | Admitting: *Deleted

## 2013-06-28 MED ORDER — LEVOTHYROXINE SODIUM 88 MCG PO TABS: 88.0000 ug | ORAL_TABLET | Freq: Every day | ORAL | Status: DC

## 2013-06-28 NOTE — Telephone Encounter (Signed)
Pt is asking for the medicine that she doesn't know the name of to be increased. Please call the pt.

## 2013-06-28 NOTE — Telephone Encounter (Signed)
rx sent for a 90 day supply, patient is aware

## 2013-07-17 ENCOUNTER — Ambulatory Visit: Payer: Medicare Other

## 2013-07-20 ENCOUNTER — Ambulatory Visit: Payer: Medicare Other

## 2013-07-20 ENCOUNTER — Ambulatory Visit (HOSPITAL_BASED_OUTPATIENT_CLINIC_OR_DEPARTMENT_OTHER): Payer: Medicare Other

## 2013-07-20 DIAGNOSIS — N039 Chronic nephritic syndrome with unspecified morphologic changes: Secondary | ICD-10-CM

## 2013-07-20 DIAGNOSIS — D638 Anemia in other chronic diseases classified elsewhere: Secondary | ICD-10-CM

## 2013-07-20 DIAGNOSIS — D631 Anemia in chronic kidney disease: Secondary | ICD-10-CM | POA: Diagnosis not present

## 2013-07-20 DIAGNOSIS — D649 Anemia, unspecified: Secondary | ICD-10-CM

## 2013-07-20 LAB — CBC WITH DIFFERENTIAL/PLATELET
BASO%: 1.2 % (ref 0.0–2.0)
Basophils Absolute: 0.1 10*3/uL (ref 0.0–0.1)
EOS%: 3 % (ref 0.0–7.0)
Eosinophils Absolute: 0.2 10*3/uL (ref 0.0–0.5)
HCT: 36.6 % (ref 34.8–46.6)
HGB: 11.2 g/dL — ABNORMAL LOW (ref 11.6–15.9)
LYMPH#: 1.4 10*3/uL (ref 0.9–3.3)
LYMPH%: 23.3 % (ref 14.0–49.7)
MCH: 24.5 pg — AB (ref 25.1–34.0)
MCHC: 30.5 g/dL — ABNORMAL LOW (ref 31.5–36.0)
MCV: 80.2 fL (ref 79.5–101.0)
MONO#: 0.5 10*3/uL (ref 0.1–0.9)
MONO%: 8.7 % (ref 0.0–14.0)
NEUT#: 3.8 10*3/uL (ref 1.5–6.5)
NEUT%: 63.8 % (ref 38.4–76.8)
Platelets: 192 10*3/uL (ref 145–400)
RBC: 4.57 10*6/uL (ref 3.70–5.45)
RDW: 14.9 % — AB (ref 11.2–14.5)
WBC: 5.9 10*3/uL (ref 3.9–10.3)

## 2013-07-20 MED ORDER — DARBEPOETIN ALFA-POLYSORBATE 300 MCG/0.6ML IJ SOLN: 300.0000 ug | Freq: Once | INTRAMUSCULAR | Status: DC

## 2013-07-27 ENCOUNTER — Ambulatory Visit
Admission: RE | Admit: 2013-07-27 | Discharge: 2013-07-27 | Disposition: A | Discharge status: Home / Self Care | Payer: Medicare Other | Source: Ambulatory Visit

## 2013-07-27 DIAGNOSIS — Z1231 Encounter for screening mammogram for malignant neoplasm of breast: Secondary | ICD-10-CM

## 2013-08-17 DIAGNOSIS — M79609 Pain in unspecified limb: Secondary | ICD-10-CM | POA: Diagnosis not present

## 2013-08-17 DIAGNOSIS — B351 Tinea unguium: Secondary | ICD-10-CM | POA: Diagnosis not present

## 2013-08-18 ENCOUNTER — Other Ambulatory Visit (HOSPITAL_BASED_OUTPATIENT_CLINIC_OR_DEPARTMENT_OTHER): Payer: Medicare Other

## 2013-08-18 ENCOUNTER — Ambulatory Visit (HOSPITAL_BASED_OUTPATIENT_CLINIC_OR_DEPARTMENT_OTHER): Payer: Medicare Other

## 2013-08-18 VITALS — BP 110/44 | HR 54 | Temp 97.6°F

## 2013-08-18 DIAGNOSIS — C50419 Malignant neoplasm of upper-outer quadrant of unspecified female breast: Secondary | ICD-10-CM

## 2013-08-18 DIAGNOSIS — D631 Anemia in chronic kidney disease: Secondary | ICD-10-CM

## 2013-08-18 DIAGNOSIS — N189 Chronic kidney disease, unspecified: Secondary | ICD-10-CM

## 2013-08-18 DIAGNOSIS — D649 Anemia, unspecified: Secondary | ICD-10-CM

## 2013-08-18 DIAGNOSIS — D638 Anemia in other chronic diseases classified elsewhere: Secondary | ICD-10-CM

## 2013-08-18 DIAGNOSIS — N039 Chronic nephritic syndrome with unspecified morphologic changes: Secondary | ICD-10-CM

## 2013-08-18 LAB — CBC WITH DIFFERENTIAL/PLATELET
BASO%: 0.7 % (ref 0.0–2.0)
BASOS ABS: 0 10*3/uL (ref 0.0–0.1)
EOS%: 3.4 % (ref 0.0–7.0)
Eosinophils Absolute: 0.2 10*3/uL (ref 0.0–0.5)
HCT: 32.1 % — ABNORMAL LOW (ref 34.8–46.6)
HEMOGLOBIN: 9.8 g/dL — AB (ref 11.6–15.9)
LYMPH#: 1.5 10*3/uL (ref 0.9–3.3)
LYMPH%: 25.3 % (ref 14.0–49.7)
MCH: 23.7 pg — AB (ref 25.1–34.0)
MCHC: 30.6 g/dL — ABNORMAL LOW (ref 31.5–36.0)
MCV: 77.4 fL — ABNORMAL LOW (ref 79.5–101.0)
MONO#: 0.5 10*3/uL (ref 0.1–0.9)
MONO%: 8.6 % (ref 0.0–14.0)
NEUT%: 62 % (ref 38.4–76.8)
NEUTROS ABS: 3.8 10*3/uL (ref 1.5–6.5)
Platelets: 197 10*3/uL (ref 145–400)
RBC: 4.14 10*6/uL (ref 3.70–5.45)
RDW: 14.5 % (ref 11.2–14.5)
WBC: 6.1 10*3/uL (ref 3.9–10.3)

## 2013-08-18 MED ORDER — DARBEPOETIN ALFA-POLYSORBATE 300 MCG/0.6ML IJ SOLN
300.0000 ug | Freq: Once | INTRAMUSCULAR | Status: AC
  Administered 2013-08-18: 300 ug via SUBCUTANEOUS
  Filled 2013-08-18: qty 0.6

## 2013-08-21 ENCOUNTER — Other Ambulatory Visit: Payer: Self-pay | Admitting: Oncology

## 2013-08-25 ENCOUNTER — Other Ambulatory Visit: Payer: Self-pay | Admitting: *Deleted

## 2013-08-25 MED ORDER — IRBESARTAN 150 MG PO TABS: 150.0000 mg | ORAL_TABLET | Freq: Every day | ORAL | Status: DC

## 2013-08-28 DIAGNOSIS — H539 Unspecified visual disturbance: Secondary | ICD-10-CM | POA: Diagnosis not present

## 2013-08-28 DIAGNOSIS — D649 Anemia, unspecified: Secondary | ICD-10-CM | POA: Diagnosis not present

## 2013-08-28 DIAGNOSIS — M1710 Unilateral primary osteoarthritis, unspecified knee: Secondary | ICD-10-CM | POA: Diagnosis not present

## 2013-08-28 DIAGNOSIS — R634 Abnormal weight loss: Secondary | ICD-10-CM | POA: Diagnosis not present

## 2013-08-28 DIAGNOSIS — E1129 Type 2 diabetes mellitus with other diabetic kidney complication: Secondary | ICD-10-CM | POA: Diagnosis not present

## 2013-08-28 DIAGNOSIS — I69998 Other sequelae following unspecified cerebrovascular disease: Secondary | ICD-10-CM | POA: Diagnosis not present

## 2013-08-28 DIAGNOSIS — I10 Essential (primary) hypertension: Secondary | ICD-10-CM | POA: Diagnosis not present

## 2013-08-29 ENCOUNTER — Telehealth: Payer: Self-pay | Admitting: Oncology

## 2013-08-29 NOTE — Telephone Encounter (Signed)
Called pt to inform appt 10/23 has been moved to 10/21 and md appt added. Pt was extremely confused about what office I'm calling from, who the doctor is, and stated she cannot move appts because she "pays someone to bring her". Pt asks that I mail her a new appt calendar so that she can inform her transportation of the change.Msg to Ander Purpura (Education officer, [employer]) to advise of appt chg. Per Dr. Alvester Chou, Lauren arranges pt's transportation and reminds pt of appts.

## 2013-09-07 ENCOUNTER — Other Ambulatory Visit: Payer: Self-pay | Admitting: Endocrinology

## 2013-09-08 ENCOUNTER — Other Ambulatory Visit: Payer: Self-pay | Admitting: Endocrinology

## 2013-09-13 DIAGNOSIS — M1710 Unilateral primary osteoarthritis, unspecified knee: Secondary | ICD-10-CM | POA: Diagnosis not present

## 2013-09-15 ENCOUNTER — Ambulatory Visit: Payer: Medicare Other

## 2013-09-15 ENCOUNTER — Other Ambulatory Visit (HOSPITAL_BASED_OUTPATIENT_CLINIC_OR_DEPARTMENT_OTHER): Payer: Medicare Other

## 2013-09-15 DIAGNOSIS — D631 Anemia in chronic kidney disease: Secondary | ICD-10-CM

## 2013-09-15 DIAGNOSIS — D649 Anemia, unspecified: Secondary | ICD-10-CM | POA: Diagnosis not present

## 2013-09-15 DIAGNOSIS — N189 Chronic kidney disease, unspecified: Principal | ICD-10-CM

## 2013-09-15 LAB — CBC WITH DIFFERENTIAL/PLATELET
BASO%: 0.5 % (ref 0.0–2.0)
Basophils Absolute: 0 10*3/uL (ref 0.0–0.1)
EOS%: 0.8 % (ref 0.0–7.0)
Eosinophils Absolute: 0.1 10*3/uL (ref 0.0–0.5)
HCT: 36.1 % (ref 34.8–46.6)
HGB: 11.5 g/dL — ABNORMAL LOW (ref 11.6–15.9)
LYMPH%: 14.5 % (ref 14.0–49.7)
MCH: 24.4 pg — AB (ref 25.1–34.0)
MCHC: 31.9 g/dL (ref 31.5–36.0)
MCV: 76.6 fL — AB (ref 79.5–101.0)
MONO#: 0.8 10*3/uL (ref 0.1–0.9)
MONO%: 9 % (ref 0.0–14.0)
NEUT#: 6.3 10*3/uL (ref 1.5–6.5)
NEUT%: 75.2 % (ref 38.4–76.8)
PLATELETS: 176 10*3/uL (ref 145–400)
RBC: 4.71 10*6/uL (ref 3.70–5.45)
RDW: 15.7 % — ABNORMAL HIGH (ref 11.2–14.5)
WBC: 8.4 10*3/uL (ref 3.9–10.3)
lymph#: 1.2 10*3/uL (ref 0.9–3.3)
nRBC: 0 % (ref 0–0)

## 2013-09-15 MED ORDER — DARBEPOETIN ALFA-POLYSORBATE 300 MCG/0.6ML IJ SOLN: 300.0000 ug | Freq: Once | INTRAMUSCULAR | Status: DC

## 2013-09-21 ENCOUNTER — Other Ambulatory Visit: Payer: Medicare Other

## 2013-09-21 ENCOUNTER — Ambulatory Visit (INDEPENDENT_AMBULATORY_CARE_PROVIDER_SITE_OTHER): Payer: Medicare Other | Admitting: Endocrinology

## 2013-09-21 ENCOUNTER — Encounter: Payer: Self-pay | Admitting: Endocrinology

## 2013-09-21 VITALS — BP 110/68 | HR 60 | Temp 98.0°F | Resp 14

## 2013-09-21 DIAGNOSIS — I10 Essential (primary) hypertension: Secondary | ICD-10-CM | POA: Diagnosis not present

## 2013-09-21 DIAGNOSIS — E1165 Type 2 diabetes mellitus with hyperglycemia: Principal | ICD-10-CM

## 2013-09-21 DIAGNOSIS — IMO0001 Reserved for inherently not codable concepts without codable children: Secondary | ICD-10-CM

## 2013-09-21 DIAGNOSIS — E039 Hypothyroidism, unspecified: Secondary | ICD-10-CM

## 2013-09-21 DIAGNOSIS — N182 Chronic kidney disease, stage 2 (mild): Secondary | ICD-10-CM | POA: Diagnosis not present

## 2013-09-21 LAB — COMPREHENSIVE METABOLIC PANEL
ALK PHOS: 87 U/L (ref 39–117)
ALT: 12 U/L (ref 0–35)
AST: 14 U/L (ref 0–37)
Albumin: 3.2 g/dL — ABNORMAL LOW (ref 3.5–5.2)
BUN: 33 mg/dL — AB (ref 6–23)
CALCIUM: 8.8 mg/dL (ref 8.4–10.5)
CO2: 31 mEq/L (ref 19–32)
CREATININE: 1.2 mg/dL (ref 0.4–1.2)
Chloride: 105 mEq/L (ref 96–112)
GFR: 54.92 mL/min — ABNORMAL LOW (ref >60.00)
Glucose, Bld: 62 mg/dL — ABNORMAL LOW (ref 70–99)
Potassium: 3.9 mEq/L (ref 3.5–5.1)
Sodium: 140 mEq/L (ref 135–145)
Total Bilirubin: 0.5 mg/dL (ref 0.2–1.2)
Total Protein: 6.4 g/dL (ref 6.0–8.3)

## 2013-09-21 LAB — HEMOGLOBIN A1C: Hgb A1c MFr Bld: 5.6 % (ref 4.6–6.5)

## 2013-09-21 LAB — TSH: TSH: 1.19 u[IU]/mL (ref 0.35–4.50)

## 2013-09-21 NOTE — Patient Instructions (Signed)
Have a small fruit instead of sweets at dinner and no more than 2 starches

## 2013-09-21 NOTE — Progress Notes (Signed)
Quick Note:  Please let patient know that her glucose is low on the lab and need to reduce her morning insulin to 20 units. She needs to eat extra snack at lunchtime ______

## 2013-09-21 NOTE — Progress Notes (Signed)
Patient ID: [name], female   DOB: September 18, 1930, 81 y.o.   MRN: [id number]   Reason for Appointment: Diabetes follow-up   History of Present Illness   Diagnosis: Type 2 DIABETES MELITUS, long-standing  She has been on insulin for several years and most of the time has been on NPH or premixed  insulin for convenience and simplicity In 3016 because of hypoglycemia overnight her evening insulin was stopped Probably because of decreased intake and weight loss her insulin requirement has been progressively less  Recent history:  More recently her blood sugars appear to be significantly higher in the evenings especially after supper although has some readings that are higher around 6 PM also  Difficult to analyze her history as she does not remember well but appears that she is eating her meals on meals food in the evenings and usually eating dessert with this. Sometimes will have a peppermint candy during the day also Most likely is not eating much at lunchtime and mostly peanut butter crackers Usually her A1c has been falsely low compared to her home blood sugar average She thinks she is taking the prescribed dose of 22 units in the morning  Hypoglycemia: only once had a low reading of 62 midday  Insulin regimen: Humulin 70/30:  22 units in the morning before breakfast, usually compliant with his               Monitors blood glucose:  2.6 times a day.    Glucometer: One Touch.          Blood Glucose reading analysis from meter download:   PREMEAL Breakfast Lunch Dinner Bedtime Overall  Glucose range:  120-301   62-187   166-198   150-275    Mean/median:  153    190   181   157    Meals:  2-3 meals per day, variable times. Dinner 6 pm          Wt Readings from Last 3 Encounters:  05/24/13 187 lb 12.8 oz (85.186 kg)  12/30/12 186 lb 12.8 oz (84.732 kg)  11/28/12 192 lb 9.6 oz (87.363 kg)   Lab Results  Component Value Date   HGBA1C 5.2 06/21/2013   HGBA1C 5.3 03/21/2013   HGBA1C  5.3 08/29/2012   Lab Results  Component Value Date   MICROALBUR 8.4* 08/29/2012   LDLCALC 38 03/21/2013   CREATININE 1.3* 06/21/2013        Medication List       This list is accurate as of: 09/21/13  1:37 PM.  Always use your most recent med list.               B-complex with vitamin C tablet  Take 1 tablet by mouth daily.     CALCIUM 600 600 MG Tabs tablet  Generic drug:  calcium carbonate  Take 600 mg by mouth 2 (two) times daily with a meal.     diazepam 5 MG tablet  Commonly known as:  VALIUM  Take 5 mg by mouth every 6 (six) hours as needed.     doxazosin 2 MG tablet  Commonly known as:  CARDURA  TAKE ONE TABLET BY MOUTH AT BEDTIME     ferrous gluconate 325 MG tablet  Commonly known as:  FERGON  Take 325 mg by mouth daily with breakfast.     Hydrocodone-Acetaminophen 10-660 MG Tabs  10-325 mg.     indapamide 1.25 MG tablet  Commonly known as:  LOZOL  TAKE ONE  TABLET BY MOUTH IN THE MORNING     insulin NPH-regular Human (70-30) 100 UNIT/ML injection  Commonly known as:  HUMULIN 70/30  Inject 28 Units into the skin daily with breakfast.     INSULIN SYRINGE 1CC/29G 29G X 1/2" 1 ML Misc  by Does not apply route.     irbesartan 150 MG tablet  Commonly known as:  AVAPRO  Take 1 tablet (150 mg total) by mouth daily.     IRON PO  Take 65 mg by mouth daily.     letrozole 2.5 MG tablet  Commonly known as:  FEMARA  TAKE ONE TABLET BY MOUTH ONCE DAILY     levothyroxine 88 MCG tablet  Commonly known as:  SYNTHROID, LEVOTHROID  Take 1 tablet (88 mcg total) by mouth daily.     metoprolol succinate 50 MG 24 hr tablet  Commonly known as:  TOPROL-XL     PERIDIN-C PO  Take by mouth.     potassium chloride SA 20 MEQ tablet  Commonly known as:  K-DUR,KLOR-CON  TAKE ONE TABLET BY MOUTH ONCE DAILY     rOPINIRole 3 MG tablet  Commonly known as:  REQUIP     traZODone 100 MG tablet  Commonly known as:  DESYREL     vitamin C 1000 MG tablet  Take 1,000 mg by  mouth daily.     Vitamin D 2000 UNITS tablet  Take 2,000 Units by mouth daily.        Allergies:  Allergies  Allergen Reactions  . Aspirin   . Insulins     NOVA?  Marland Kitchen Penicillins     Past Medical History  Diagnosis Date  . Hemorrhoid   . Anemia   . Arthritis     Past Surgical History  Procedure Laterality Date  . Breast lumpectomy      LEFT  . Abdominal hysterectomy      PARTIAL  . Mastectomy  05/2008    Left    No family history on file.  Social History:  reports that she has never smoked. She does not have any smokeless tobacco history on file. She reports that she does not drink alcohol. Her drug history is not on file.  Review of Systems:  HYPERTENSION: This is long-standing and has been well controlled, still on multiple medications  HYPOTHYROIDISM: She has had long-standing hypothyroidism and the dose has been reduced on her visit in 3/15  Lab Results  Component Value Date   TSH 0.37 06/21/2013   TSH 0.31* 03/21/2013   She has had leg weakness and is in a wheelchair but can use a walker at home  No history of hypercholesterolemia  Lab Results  Component Value Date   CHOL 92 03/21/2013   HDL 46.00 03/21/2013   LDLCALC 38 03/21/2013   TRIG 41.0 03/21/2013   CHOLHDL 2 03/21/2013    History of breast cancer, followed by oncologist Also followed for anemia by oncologist  Foot exam in 6/15 showed normal monofilament sensation, absent pedal pulses, no deformities or calluses   Examination:   BP 110/68  Pulse 60  Temp(Src) 98 F (36.7 C)  Resp 14  SpO2 96%  Cannot calculate BMI with a height equal to zero.   Repeat blood pressure 130/70    thyroid not enlarged, no lymphadenopathy in the neck  Heart sounds normal No pedal edema  ASSESSMENT/ PLAN:   Diabetes type 2:   The patient's diabetes  has been managed conservatively  for some time  with once a day premixed insulin A1c generally lower than expected for her blood sugar readings  Most of her high  readings are postprandial after her evening meal and she is not watching her diet at that time especially with eating desserts which are causing higher readings It will be difficult to have her take another insulin injection for mealtime coverage at suppertime  She agrees to leave off her desserts and candy in the evening  Meanwhile since blood sugars are fairly good fasting and in the afternoon will continue her same dose of premixed insulin She will call if she has persistently high or low readings   HYPERTENSION: Blood pressure is well-controlled  She has had  Some renal dysfunction and will recheck her creatinine Also needs followup urine microalbumin   HYPOTHYROIDISM:  she tends to have relatively low TSH levels. Will  check her TSH again and  adjust the dose as needed   Counseling time over 50% of today's 25 minute visit regarding Diabetes management and medications  Katriona Schmierer 09/21/2013, 1:37 PM     Addendum: Labs as follows, will reduce her insulin to 20 units  Office Visit on 09/21/2013  Component Date Value Ref Range Status  . Hemoglobin A1C 09/21/2013 5.6  4.6 - 6.5 % Final   Glycemic Control Guidelines for People with Diabetes:Non Diabetic:  <6%Goal of Therapy: <7%Additional Action Suggested:  >8%   . Sodium 09/21/2013 140  135 - 145 mEq/L Final  . Potassium 09/21/2013 3.9  3.5 - 5.1 mEq/L Final  . Chloride 09/21/2013 105  96 - 112 mEq/L Final  . CO2 09/21/2013 31  19 - 32 mEq/L Final  . Glucose, Bld 09/21/2013 62* 70 - 99 mg/dL Final  . BUN 09/21/2013 33* 6 - 23 mg/dL Final  . Creatinine, Ser 09/21/2013 1.2  0.4 - 1.2 mg/dL Final  . Total Bilirubin 09/21/2013 0.5  0.2 - 1.2 mg/dL Final  . Alkaline Phosphatase 09/21/2013 87  39 - 117 U/L Final  . AST 09/21/2013 14  0 - 37 U/L Final  . ALT 09/21/2013 12  0 - 35 U/L Final  . Total Protein 09/21/2013 6.4  6.0 - 8.3 g/dL Final  . Albumin 09/21/2013 3.2* 3.5 - 5.2 g/dL Final  . Calcium 09/21/2013 8.8  8.4 - 10.5 mg/dL  Final  . GFR 09/21/2013 54.92* >60.00 mL/min Final  . TSH 09/21/2013 1.19  0.35 - 4.50 uIU/mL Final

## 2013-09-27 ENCOUNTER — Encounter: Payer: Self-pay | Admitting: *Deleted

## 2013-10-12 ENCOUNTER — Other Ambulatory Visit: Payer: Self-pay

## 2013-10-12 DIAGNOSIS — D631 Anemia in chronic kidney disease: Secondary | ICD-10-CM

## 2013-10-12 DIAGNOSIS — C50912 Malignant neoplasm of unspecified site of left female breast: Secondary | ICD-10-CM

## 2013-10-12 DIAGNOSIS — N189 Chronic kidney disease, unspecified: Secondary | ICD-10-CM

## 2013-10-13 ENCOUNTER — Other Ambulatory Visit (HOSPITAL_BASED_OUTPATIENT_CLINIC_OR_DEPARTMENT_OTHER): Payer: Medicare Other

## 2013-10-13 ENCOUNTER — Ambulatory Visit (HOSPITAL_BASED_OUTPATIENT_CLINIC_OR_DEPARTMENT_OTHER): Payer: Medicare Other

## 2013-10-13 ENCOUNTER — Telehealth: Payer: Self-pay | Admitting: *Deleted

## 2013-10-13 VITALS — BP 120/33 | HR 57 | Temp 98.1°F

## 2013-10-13 DIAGNOSIS — Z23 Encounter for immunization: Secondary | ICD-10-CM

## 2013-10-13 DIAGNOSIS — D631 Anemia in chronic kidney disease: Secondary | ICD-10-CM

## 2013-10-13 DIAGNOSIS — N039 Chronic nephritic syndrome with unspecified morphologic changes: Secondary | ICD-10-CM

## 2013-10-13 DIAGNOSIS — C50419 Malignant neoplasm of upper-outer quadrant of unspecified female breast: Secondary | ICD-10-CM | POA: Diagnosis not present

## 2013-10-13 DIAGNOSIS — N189 Chronic kidney disease, unspecified: Secondary | ICD-10-CM | POA: Diagnosis not present

## 2013-10-13 DIAGNOSIS — C50912 Malignant neoplasm of unspecified site of left female breast: Secondary | ICD-10-CM

## 2013-10-13 LAB — CBC WITH DIFFERENTIAL/PLATELET
BASO%: 1 % (ref 0.0–2.0)
BASOS ABS: 0.1 10*3/uL (ref 0.0–0.1)
EOS%: 3.9 % (ref 0.0–7.0)
Eosinophils Absolute: 0.2 10*3/uL (ref 0.0–0.5)
HCT: 30.5 % — ABNORMAL LOW (ref 34.8–46.6)
HEMOGLOBIN: 9.4 g/dL — AB (ref 11.6–15.9)
LYMPH#: 1.4 10*3/uL (ref 0.9–3.3)
LYMPH%: 26.1 % (ref 14.0–49.7)
MCH: 23.8 pg — AB (ref 25.1–34.0)
MCHC: 30.8 g/dL — ABNORMAL LOW (ref 31.5–36.0)
MCV: 77.3 fL — ABNORMAL LOW (ref 79.5–101.0)
MONO#: 0.5 10*3/uL (ref 0.1–0.9)
MONO%: 9.8 % (ref 0.0–14.0)
NEUT#: 3.1 10*3/uL (ref 1.5–6.5)
NEUT%: 59.2 % (ref 38.4–76.8)
Platelets: 180 10*3/uL (ref 145–400)
RBC: 3.95 10*6/uL (ref 3.70–5.45)
RDW: 15.2 % — ABNORMAL HIGH (ref 11.2–14.5)
WBC: 5.2 10*3/uL (ref 3.9–10.3)

## 2013-10-13 MED ORDER — DARBEPOETIN ALFA-POLYSORBATE 300 MCG/0.6ML IJ SOLN
300.0000 ug | Freq: Once | INTRAMUSCULAR | Status: AC
  Administered 2013-10-13: 300 ug via SUBCUTANEOUS
  Filled 2013-10-13: qty 0.6

## 2013-10-13 MED ORDER — INFLUENZA VAC SPLIT QUAD 0.5 ML IM SUSY
0.5000 mL | PREFILLED_SYRINGE | Freq: Once | INTRAMUSCULAR | Status: AC
  Administered 2013-10-13: 0.5 mL via INTRAMUSCULAR
  Filled 2013-10-13: qty 0.5

## 2013-10-13 NOTE — Telephone Encounter (Signed)
Patient here for lab and injection appointment.  Completed walk in form which reads "Chest pain at times".  Spoke with her in injection room.  Reports "early morning when I wake up or when I eat breakfast I have a pain where I had my surgery.  When I lean forward and sit up, or breath deeply, it goes away.  It doesn't happen very often but often enough for me to ask about it."  Will leave this information for Dr. Marko Plume and asked that she notify her PCP.

## 2013-10-19 DIAGNOSIS — B351 Tinea unguium: Secondary | ICD-10-CM | POA: Diagnosis not present

## 2013-10-19 DIAGNOSIS — E1165 Type 2 diabetes mellitus with hyperglycemia: Secondary | ICD-10-CM | POA: Diagnosis not present

## 2013-10-19 DIAGNOSIS — M79674 Pain in right toe(s): Secondary | ICD-10-CM | POA: Diagnosis not present

## 2013-10-19 DIAGNOSIS — M79675 Pain in left toe(s): Secondary | ICD-10-CM | POA: Diagnosis not present

## 2013-11-07 ENCOUNTER — Other Ambulatory Visit: Payer: Self-pay | Admitting: Oncology

## 2013-11-07 DIAGNOSIS — N189 Chronic kidney disease, unspecified: Principal | ICD-10-CM

## 2013-11-07 DIAGNOSIS — D631 Anemia in chronic kidney disease: Secondary | ICD-10-CM

## 2013-11-08 ENCOUNTER — Encounter: Payer: Self-pay | Admitting: Oncology

## 2013-11-08 ENCOUNTER — Ambulatory Visit: Payer: Medicare Other

## 2013-11-08 ENCOUNTER — Other Ambulatory Visit (HOSPITAL_BASED_OUTPATIENT_CLINIC_OR_DEPARTMENT_OTHER): Payer: Medicare Other

## 2013-11-08 ENCOUNTER — Telehealth: Payer: Self-pay | Admitting: Oncology

## 2013-11-08 ENCOUNTER — Ambulatory Visit (HOSPITAL_BASED_OUTPATIENT_CLINIC_OR_DEPARTMENT_OTHER): Payer: Medicare Other | Admitting: Oncology

## 2013-11-08 VITALS — BP 103/51 | HR 49 | Temp 98.1°F | Resp 18 | Ht 66.0 in | Wt 190.4 lb

## 2013-11-08 DIAGNOSIS — R251 Tremor, unspecified: Secondary | ICD-10-CM

## 2013-11-08 DIAGNOSIS — M8580 Other specified disorders of bone density and structure, unspecified site: Secondary | ICD-10-CM | POA: Diagnosis not present

## 2013-11-08 DIAGNOSIS — C50912 Malignant neoplasm of unspecified site of left female breast: Secondary | ICD-10-CM

## 2013-11-08 DIAGNOSIS — C50412 Malignant neoplasm of upper-outer quadrant of left female breast: Secondary | ICD-10-CM

## 2013-11-08 DIAGNOSIS — D631 Anemia in chronic kidney disease: Secondary | ICD-10-CM

## 2013-11-08 DIAGNOSIS — N189 Chronic kidney disease, unspecified: Secondary | ICD-10-CM | POA: Diagnosis not present

## 2013-11-08 DIAGNOSIS — N183 Chronic kidney disease, stage 3 unspecified: Secondary | ICD-10-CM

## 2013-11-08 DIAGNOSIS — Z170 Estrogen receptor positive status [ER+]: Secondary | ICD-10-CM | POA: Diagnosis not present

## 2013-11-08 DIAGNOSIS — C773 Secondary and unspecified malignant neoplasm of axilla and upper limb lymph nodes: Secondary | ICD-10-CM | POA: Diagnosis not present

## 2013-11-08 LAB — CBC WITH DIFFERENTIAL/PLATELET
BASO%: 0.8 % (ref 0.0–2.0)
Basophils Absolute: 0 10*3/uL (ref 0.0–0.1)
EOS%: 1.9 % (ref 0.0–7.0)
Eosinophils Absolute: 0.1 10*3/uL (ref 0.0–0.5)
HCT: 34.2 % — ABNORMAL LOW (ref 34.8–46.6)
HGB: 10.4 g/dL — ABNORMAL LOW (ref 11.6–15.9)
LYMPH%: 28 % (ref 14.0–49.7)
MCH: 23.9 pg — AB (ref 25.1–34.0)
MCHC: 30.3 g/dL — AB (ref 31.5–36.0)
MCV: 79 fL — AB (ref 79.5–101.0)
MONO#: 0.5 10*3/uL (ref 0.1–0.9)
MONO%: 10.5 % (ref 0.0–14.0)
NEUT#: 3 10*3/uL (ref 1.5–6.5)
NEUT%: 58.8 % (ref 38.4–76.8)
Platelets: 176 10*3/uL (ref 145–400)
RBC: 4.33 10*6/uL (ref 3.70–5.45)
RDW: 15 % — ABNORMAL HIGH (ref 11.2–14.5)
WBC: 5.1 10*3/uL (ref 3.9–10.3)
lymph#: 1.4 10*3/uL (ref 0.9–3.3)

## 2013-11-08 LAB — COMPREHENSIVE METABOLIC PANEL (CC13)
ALT: 10 U/L (ref 0–55)
AST: 11 U/L (ref 5–34)
Albumin: 3.1 g/dL — ABNORMAL LOW (ref 3.5–5.0)
Alkaline Phosphatase: 80 U/L (ref 40–150)
Anion Gap: 6 mEq/L (ref 3–11)
BUN: 39.1 mg/dL — ABNORMAL HIGH (ref 7.0–26.0)
CO2: 27 mEq/L (ref 22–29)
Calcium: 9.2 mg/dL (ref 8.4–10.4)
Chloride: 107 mEq/L (ref 98–109)
Creatinine: 1.4 mg/dL — ABNORMAL HIGH (ref 0.6–1.1)
Glucose: 76 mg/dl (ref 70–140)
POTASSIUM: 4.2 meq/L (ref 3.5–5.1)
Sodium: 140 mEq/L (ref 136–145)
Total Bilirubin: 0.55 mg/dL (ref 0.20–1.20)
Total Protein: 6.2 g/dL — ABNORMAL LOW (ref 6.4–8.3)

## 2013-11-08 LAB — IRON AND TIBC CHCC
%SAT: 23 % (ref 21–57)
Iron: 33 ug/dL — ABNORMAL LOW (ref 41–142)
TIBC: 144 ug/dL — ABNORMAL LOW (ref 236–444)
UIBC: 111 ug/dL — ABNORMAL LOW (ref 120–384)

## 2013-11-08 NOTE — Telephone Encounter (Signed)
per pof to sch pt appt-gave pt copy of sch °

## 2013-11-08 NOTE — Progress Notes (Signed)
OFFICE PROGRESS NOTE   11/08/2013   Physicians:Cammie Fulp (PCP), Ruffin Frederick, H.Dalbert Batman   INTERVAL HISTORY:  Patient is seen, together with social worker, in scheduled follow up of anemia related to CKD 3 for which she is on Aranesp, and  left breast cancer now on observation since she completed 5 years of Femara in 06-2013.   For the most part, patient has needed Aranesp ~ every 8 weeks to keep Hgb in range 10-11. She is comfortable with Hgb 10.4 today, last Aranesp on 10-13-13 for Hgb 9.4. She continues po iron and iron studies have been sent with other labs today, last checked 10-2012. Patient had right mammogram at [facility] 07-28-13, with no mammographic findings of concern. She has sensitivity across left mastectomy area at times,  tho not pain. She is pleased to be off Femara.  She does not have PAC. She had flu vaccine this fall.   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 Femara begun ~ June 2010.      Breast cancer, left breast   10/07/2012 Initial Diagnosis Breast cancer, left breast  Patient was diagnosed with multicentric invasive ductal carcinoma of left breast after abnormality found on screening mammogram 05-18-2008. This was intermediate grade, ER positive 100%, PR + 70%, HER 2 negative. She had mastectomy with sentinel node evaluation, with 2 separate lesions, one 1.7 cm and the other 0.7 cm, margins clear and 2 sentinel nodes involved + one additional node negative. Patient declined radiation but has been on Femara since ~ June 2010. Dr Truddie Coco has seen her ~ every 6 months. In this EMR she has reports of CXR in 2010 and CT head in 03-2010 (refused staging studies at diagnosis).    Review of systems as above, also: Denies SOB now. Denies other pain. Appetite is good, constipation as previously and apparently  is taking colace regularly.Denies any bleeding. Remainder of 10 point Review of Systems negative.  Objective:  Vital signs in last 24 hours:  BP 103/51  Pulse 49  Temp(Src) 98.1 F (36.7 C) (Oral)  Resp 18  Ht 5\' 6"  (1.676 m)  Wt 190 lb 6.4 oz (86.365 kg)  BMI 30.75 kg/m2 Weight is up 3 lbs. Alert, oriented and appropriate responses, needs instructions repeated several times. In WC, respirations not labored RA.   HEENT:PERRL, sclerae not icteric. Oral mucosa moist without lesions, posterior pharynx clear.   No JVD.  Lymphatics:no cervical,suraclavicular, axillary adenopathy Resp: clear to auscultation bilaterally bilaterally Cardio: regular rate and rhythm. No gallop. GI: abdomen obese, soft, nontender, not distended, no mass or organomegaly. A few bowel sounds. Musculoskeletal/ Extremities: without pitting edema, cords, tenderness Neuro: speech fluent and appropriate, moves all extremities in wheelchair Skin without rash, ecchymosis, petechiae Breasts: Left mastectomy scar well healed without evidence of local recurrence, no unexpected tenderness, no erythema. Right breast without dominant mass, skin or nipple findings. Axillae benign.   Lab Results:  Results for orders placed in visit on 11/08/13  CBC WITH DIFFERENTIAL      Result Value Ref Range   WBC 5.1  3.9 - 10.3 10e3/uL   NEUT# 3.0  1.5 - 6.5 10e3/uL   HGB 10.4 (*) 11.6 - 15.9 g/dL   HCT 34.2 (*) 34.8 - 46.6 %   Platelets 176  145 - 400 10e3/uL   MCV 79.0 (*) 79.5 -  101.0 fL   MCH 23.9 (*) 25.1 - 34.0 pg   MCHC 30.3 (*) 31.5 - 36.0 g/dL   RBC 4.33  3.70 - 5.45 10e6/uL   RDW 15.0 (*) 11.2 - 14.5 %   lymph# 1.4  0.9 - 3.3 10e3/uL   MONO# 0.5  0.1 - 0.9 10e3/uL   Eosinophils Absolute 0.1  0.0 - 0.5 10e3/uL   Basophils Absolute 0.0  0.0 - 0.1 10e3/uL   NEUT% 58.8  38.4 - 76.8 %   LYMPH% 28.0  14.0 - 49.7 %   MONO% 10.5  0.0 - 14.0 %   EOS% 1.9  0.0 - 7.0 %   BASO% 0.8  0.0 - 2.0 %    Iron studies resulted  after visit a little low Studies/Results:  No results found.  Medications: I have reviewed the patient's current medications. Social worker and RN also trying to confirm meds now, as not clear that she is taking all listed.  DISCUSSION: Will continue on observation for node positive left breast cancer since completion of 5 years adjuvant aromatase inhibitor in this elderly lady with multiple comorbidities. Will have her increase po iron and change erythropoietin injections to every other month, to keep hemoglobin >= approximately 10.   Assessment/Plan:  1. Left breast cancer T1N1 ER/PR +, HER 2 negative, 2 sentinel nodes involved: out almost 4 years from diagnosis, post mastectomy with 3 node axillary evaluation. She will completed 5 years of Femara in June 2015 and will follow on observation now. She is up to date on mammograms. 2.osteopenia progressive on aromatase inhibitor. She does have calcium bid listed on medications 3.anemia secondary to chronic kidney disease and chronic disease: Aranesp changed to every other month, to keep hgb >= approximately 10. Iron slightly low, will increase po iron by one tablet daily above what she is presently taking. Appreciate staff assisting with instructions for meds.  4.degenerative arthritis, very limited mobility. She does walk with walker in home.  5.poor visual acuity  6.chronic tremor  7.diabetes on insulin: followed by Dr Dwyane Dee  8.strong FH heart disease 9.CKD 3: GFR 54.9 10. She had flu vaccine 10-13-13   Patient does want to continue follow up at this office. SW also assisting with transportation and reminding her of appointments - thank you!  Iliana Hutt P, MD   11/08/2013, 1:47 PM

## 2013-11-08 NOTE — Progress Notes (Signed)
Hgb: 10.4.  No aranesp needed per Barbaraann Share, RN. Patient saw Dr Marko Plume today.

## 2013-11-09 ENCOUNTER — Telehealth: Payer: Self-pay

## 2013-11-09 ENCOUNTER — Other Ambulatory Visit: Payer: Self-pay | Admitting: Oncology

## 2013-11-09 DIAGNOSIS — D631 Anemia in chronic kidney disease: Secondary | ICD-10-CM

## 2013-11-09 DIAGNOSIS — N189 Chronic kidney disease, unspecified: Principal | ICD-10-CM

## 2013-11-09 NOTE — Telephone Encounter (Signed)
Told [name] that her Iron is low. She went to her pills and took the box that said Iron.  She takes one of the pills which is grren.  Told her Dr. Marko Plume wants her to take 2 a day.  Pt. Repeated diredtions several times to the nurse to be sure she understood directions.  She said she would take a second tab for today right now.

## 2013-11-09 NOTE — Telephone Encounter (Signed)
Message copied by Baruch Merl on Thu Nov 09, 2013  5:28 PM ------      Message from: Evlyn Clines P      Created: Thu Nov 09, 2013 12:31 PM       Iron low 11-08-13.      Please try to get her to increase po iron by ~1 tab daily above whatever she is taking now - if she is not taking it daily, just increase to daily. If she is taking daily, try bid.            thanks ------

## 2013-11-10 ENCOUNTER — Other Ambulatory Visit: Payer: Medicare Other

## 2013-11-10 ENCOUNTER — Ambulatory Visit: Payer: Medicare Other

## 2013-11-20 ENCOUNTER — Other Ambulatory Visit: Payer: Self-pay | Admitting: Endocrinology

## 2013-12-01 ENCOUNTER — Other Ambulatory Visit (HOSPITAL_BASED_OUTPATIENT_CLINIC_OR_DEPARTMENT_OTHER): Payer: Medicare Other

## 2013-12-01 ENCOUNTER — Ambulatory Visit: Payer: Medicare Other

## 2013-12-01 DIAGNOSIS — C50412 Malignant neoplasm of upper-outer quadrant of left female breast: Secondary | ICD-10-CM

## 2013-12-01 DIAGNOSIS — D631 Anemia in chronic kidney disease: Secondary | ICD-10-CM

## 2013-12-01 DIAGNOSIS — N189 Chronic kidney disease, unspecified: Principal | ICD-10-CM

## 2013-12-01 LAB — CBC WITH DIFFERENTIAL/PLATELET
BASO%: 0.5 % (ref 0.0–2.0)
Basophils Absolute: 0 10*3/uL (ref 0.0–0.1)
EOS ABS: 0.2 10*3/uL (ref 0.0–0.5)
EOS%: 2.7 % (ref 0.0–7.0)
HEMATOCRIT: 31.8 % — AB (ref 34.8–46.6)
HEMOGLOBIN: 10 g/dL — AB (ref 11.6–15.9)
LYMPH#: 1.4 10*3/uL (ref 0.9–3.3)
LYMPH%: 23.9 % (ref 14.0–49.7)
MCH: 24.2 pg — AB (ref 25.1–34.0)
MCHC: 31.4 g/dL — ABNORMAL LOW (ref 31.5–36.0)
MCV: 77 fL — ABNORMAL LOW (ref 79.5–101.0)
MONO#: 0.6 10*3/uL (ref 0.1–0.9)
MONO%: 10.6 % (ref 0.0–14.0)
NEUT#: 3.7 10*3/uL (ref 1.5–6.5)
NEUT%: 62.3 % (ref 38.4–76.8)
PLATELETS: 168 10*3/uL (ref 145–400)
RBC: 4.13 10*6/uL (ref 3.70–5.45)
RDW: 14.2 % (ref 11.2–14.5)
WBC: 6 10*3/uL (ref 3.9–10.3)

## 2013-12-21 ENCOUNTER — Ambulatory Visit (INDEPENDENT_AMBULATORY_CARE_PROVIDER_SITE_OTHER): Payer: Medicare Other | Admitting: Endocrinology

## 2013-12-21 ENCOUNTER — Encounter: Payer: Self-pay | Admitting: Endocrinology

## 2013-12-21 VITALS — BP 118/60 | HR 60 | Temp 98.0°F | Resp 16

## 2013-12-21 DIAGNOSIS — E038 Other specified hypothyroidism: Secondary | ICD-10-CM

## 2013-12-21 DIAGNOSIS — E063 Autoimmune thyroiditis: Secondary | ICD-10-CM

## 2013-12-21 DIAGNOSIS — E1165 Type 2 diabetes mellitus with hyperglycemia: Secondary | ICD-10-CM

## 2013-12-21 DIAGNOSIS — I10 Essential (primary) hypertension: Secondary | ICD-10-CM | POA: Diagnosis not present

## 2013-12-21 DIAGNOSIS — IMO0002 Reserved for concepts with insufficient information to code with codable children: Secondary | ICD-10-CM

## 2013-12-21 LAB — BASIC METABOLIC PANEL
BUN: 32 mg/dL — ABNORMAL HIGH (ref 6–23)
CO2: 28 meq/L (ref 19–32)
CREATININE: 1.4 mg/dL — AB (ref 0.4–1.2)
Calcium: 8.7 mg/dL (ref 8.4–10.5)
Chloride: 105 mEq/L (ref 96–112)
GFR: 46.01 mL/min — ABNORMAL LOW (ref >60.00)
Glucose, Bld: 52 mg/dL — ABNORMAL LOW (ref 70–99)
Potassium: 3.8 mEq/L (ref 3.5–5.1)
SODIUM: 140 meq/L (ref 135–145)

## 2013-12-21 LAB — HEMOGLOBIN A1C: HEMOGLOBIN A1C: 6 % (ref 4.6–6.5)

## 2013-12-21 NOTE — Progress Notes (Signed)
Patient ID: [name], female   DOB: [birth year], 80 y.o.   MRN: [id number]   Reason for Appointment:  follow-up   History of Present Illness   Diagnosis: Type 2 DIABETES MELITUS, long-standing  She has been on insulin for several years and most of the time has been on NPH or premixed  insulin for convenience and simplicity In 5465 because of hypoglycemia overnight her evening insulin was stopped Probably because of decreased intake and weight loss her insulin requirement has been progressively less  Recent history:  More recently her blood sugars appear to be relatively better overall although still significantly higher in the evenings after supper She is only taking one injection a day and fasting blood sugars are generally well controlled with this Has less variability compared to her last visit Usually her A1c has been falsely low compared to her home blood sugar average She thinks she is taking the prescribed dose of 20 units in the morning but her insulin is being drawn up by her daughter Has checked her sugar fairly consistently Takes her insulin about 30 minutes before eating usually  Hypoglycemia: None recently Insulin regimen: Humulin 70/30:  22 units in the morning before breakfast, usually compliant with his               Monitors blood glucose:  2.5 times a day.    Glucometer: One Touch.          Blood Glucose reading analysis from meter download:   PRE-MEAL Breakfast Lunch  3-6 PM   PCS  Overall  Glucose range:  99-158   86-117   102-177   151-218    Median  134    125   188   136     Meals:  2-3 meals per day, variable times. Dinner 6 pm          Wt Readings from Last 3 Encounters:  11/08/13 190 lb 6.4 oz (86.365 kg)  05/24/13 187 lb 12.8 oz (85.186 kg)  12/30/12 186 lb 12.8 oz (84.732 kg)   Lab Results  Component Value Date   HGBA1C 5.6 09/21/2013   HGBA1C 5.2 06/21/2013   HGBA1C 5.3 03/21/2013   Lab Results  Component Value Date   MICROALBUR 8.4*  08/29/2012   LDLCALC 38 03/21/2013   CREATININE 1.4* 11/08/2013        Medication List       This list is accurate as of: 12/21/13  1:34 PM.  Always use your most recent med list.               B-complex with vitamin C tablet  Take 1 tablet by mouth daily.     CALCIUM 600 600 MG Tabs tablet  Generic drug:  calcium carbonate  Take 600 mg by mouth 2 (two) times daily with a meal.     diazepam 5 MG tablet  Commonly known as:  VALIUM  Take 5 mg by mouth every 6 (six) hours as needed.     docusate sodium 100 MG capsule  Commonly known as:  COLACE  Take 100 mg by mouth daily as needed for mild constipation.     doxazosin 2 MG tablet  Commonly known as:  CARDURA  TAKE ONE TABLET BY MOUTH AT BEDTIME     HUMULIN 70/30 (70-30) 100 UNIT/ML injection  Generic drug:  insulin NPH-regular Human  INJECT 28 UNITS UNDER THE SKIN DAILY WITH BREAKFAST     Hydrocodone-Acetaminophen 10-660 MG Tabs  10-325 mg.  indapamide 1.25 MG tablet  Commonly known as:  LOZOL  TAKE ONE TABLET BY MOUTH IN THE MORNING     INSULIN SYRINGE 1CC/29G 29G X 1/2" 1 ML Misc  by Does not apply route.     INTEGRA PLUS PO  Take 2 tablets by mouth daily.     irbesartan 150 MG tablet  Commonly known as:  AVAPRO  Take 1 tablet (150 mg total) by mouth daily.     levothyroxine 88 MCG tablet  Commonly known as:  SYNTHROID, LEVOTHROID  TAKE ONE TABLET BY MOUTH ONCE DAILY     metoprolol succinate 50 MG 24 hr tablet  Commonly known as:  TOPROL-XL     olmesartan 40 MG tablet  Commonly known as:  BENICAR  Take 40 mg by mouth daily.     PERIDIN-C PO  Take by mouth.     potassium chloride SA 20 MEQ tablet  Commonly known as:  K-DUR,KLOR-CON  TAKE ONE TABLET BY MOUTH ONCE DAILY     rOPINIRole 3 MG tablet  Commonly known as:  REQUIP     vitamin C 1000 MG tablet  Take 1,000 mg by mouth daily.     Vitamin D 2000 UNITS tablet  Take 2,000 Units by mouth daily.        Allergies:  Allergies   Allergen Reactions  . Aspirin   . Insulins     NOVA?  Marland Kitchen Penicillins     Past Medical History  Diagnosis Date  . Hemorrhoid   . Anemia   . Arthritis     Past Surgical History  Procedure Laterality Date  . Breast lumpectomy      LEFT  . Abdominal hysterectomy      PARTIAL  . Mastectomy  05/2008    Left    No family history on file.  Social History:  reports that she has never smoked. She does not have any smokeless tobacco history on file. She reports that she does not drink alcohol. Her drug history is not on file.  Review of Systems:  HYPERTENSION: This is long-standing and has been well controlled, still on multiple medications  HYPOTHYROIDISM: She has had long-standing hypothyroidism and the dose has been reduced on her visit in 3/15  Lab Results  Component Value Date   TSH 1.19 09/21/2013   TSH 0.37 06/21/2013   She has had leg weakness and is in a wheelchair but can use a walker at home  No history of hypercholesterolemia  Lab Results  Component Value Date   CHOL 92 03/21/2013   HDL 46.00 03/21/2013   LDLCALC 38 03/21/2013   TRIG 41.0 03/21/2013   CHOLHDL 2 03/21/2013    History of breast cancer, followed by oncologist Also followed for anemia by oncologist and is going to be seen every 2 months now    Foot exam in 6/15 showed normal monofilament sensation, absent pedal pulses, no deformities or calluses   Examination:   BP 118/60 mmHg  Pulse 60  Temp(Src) 98 F (36.7 C)  Resp 16  Ht   Wt   SpO2 97%  Cannot calculate BMI with a height equal to zero.   Repeat blood pressure 130/70    thyroid not enlarged, no lymphadenopathy in the neck  Heart sounds normal No pedal edema  ASSESSMENT/ PLAN:   Diabetes type 2:   The patient's diabetes  has been somewhat better control since her last visit with less fluctuation and overall lower average readings at home She  does have high readings after her evening meal but not consistently Since she is  not able to follow instructions easily and would like to keep her regimen simple considering her age will keep her on once a day premixed insulin A1c to be checked again  Hypertension: Her blood pressure is relatively lower and we will stop her doxazosin  Thyroid level to be checked again On next visit  [name] 12/21/2013, 1:34 PM

## 2013-12-21 NOTE — Patient Instructions (Signed)
Stop DOXAZOSIN

## 2013-12-25 ENCOUNTER — Telehealth: Payer: Self-pay | Admitting: Oncology

## 2013-12-25 NOTE — Telephone Encounter (Signed)
s.w. pt and advised on March appt d.t. change....pt ok and aware....mailed pt appt sched and letter

## 2013-12-28 DIAGNOSIS — L600 Ingrowing nail: Secondary | ICD-10-CM | POA: Diagnosis not present

## 2013-12-28 DIAGNOSIS — M79675 Pain in left toe(s): Secondary | ICD-10-CM | POA: Diagnosis not present

## 2013-12-28 DIAGNOSIS — M79674 Pain in right toe(s): Secondary | ICD-10-CM | POA: Diagnosis not present

## 2013-12-28 DIAGNOSIS — B351 Tinea unguium: Secondary | ICD-10-CM | POA: Diagnosis not present

## 2014-01-08 DIAGNOSIS — M170 Bilateral primary osteoarthritis of knee: Secondary | ICD-10-CM | POA: Diagnosis not present

## 2014-02-02 ENCOUNTER — Ambulatory Visit (HOSPITAL_BASED_OUTPATIENT_CLINIC_OR_DEPARTMENT_OTHER): Payer: Medicare Other

## 2014-02-02 ENCOUNTER — Other Ambulatory Visit (HOSPITAL_BASED_OUTPATIENT_CLINIC_OR_DEPARTMENT_OTHER): Payer: Medicare Other

## 2014-02-02 DIAGNOSIS — N189 Chronic kidney disease, unspecified: Secondary | ICD-10-CM

## 2014-02-02 DIAGNOSIS — D631 Anemia in chronic kidney disease: Secondary | ICD-10-CM

## 2014-02-02 LAB — CBC WITH DIFFERENTIAL/PLATELET
BASO%: 0.5 % (ref 0.0–2.0)
Basophils Absolute: 0 10*3/uL (ref 0.0–0.1)
EOS%: 2.2 % (ref 0.0–7.0)
Eosinophils Absolute: 0.1 10*3/uL (ref 0.0–0.5)
HCT: 28.4 % — ABNORMAL LOW (ref 34.8–46.6)
HGB: 9 g/dL — ABNORMAL LOW (ref 11.6–15.9)
LYMPH%: 23.8 % (ref 14.0–49.7)
MCH: 24.5 pg — ABNORMAL LOW (ref 25.1–34.0)
MCHC: 31.7 g/dL (ref 31.5–36.0)
MCV: 77.2 fL — ABNORMAL LOW (ref 79.5–101.0)
MONO#: 0.5 10*3/uL (ref 0.1–0.9)
MONO%: 8 % (ref 0.0–14.0)
NEUT#: 4.1 10*3/uL (ref 1.5–6.5)
NEUT%: 65.5 % (ref 38.4–76.8)
Platelets: 209 10*3/uL (ref 145–400)
RBC: 3.68 10*6/uL — ABNORMAL LOW (ref 3.70–5.45)
RDW: 14.6 % — AB (ref 11.2–14.5)
WBC: 6.3 10*3/uL (ref 3.9–10.3)
lymph#: 1.5 10*3/uL (ref 0.9–3.3)

## 2014-02-02 MED ORDER — DARBEPOETIN ALFA 300 MCG/0.6ML IJ SOSY
300.0000 ug | PREFILLED_SYRINGE | Freq: Once | INTRAMUSCULAR | Status: AC
  Administered 2014-02-02: 300 ug via SUBCUTANEOUS
  Filled 2014-02-02: qty 0.6

## 2014-02-06 ENCOUNTER — Other Ambulatory Visit: Payer: Self-pay | Admitting: Endocrinology

## 2014-02-19 ENCOUNTER — Other Ambulatory Visit: Payer: Self-pay | Admitting: *Deleted

## 2014-02-19 MED ORDER — IRBESARTAN 150 MG PO TABS: 150.0000 mg | ORAL_TABLET | Freq: Every day | ORAL | Status: DC

## 2014-02-22 ENCOUNTER — Other Ambulatory Visit: Payer: Self-pay | Admitting: Endocrinology

## 2014-02-23 ENCOUNTER — Other Ambulatory Visit: Payer: Self-pay | Admitting: *Deleted

## 2014-02-23 MED ORDER — IRBESARTAN 150 MG PO TABS: 150.0000 mg | ORAL_TABLET | Freq: Every day | ORAL | Status: DC

## 2014-02-27 DIAGNOSIS — I69998 Other sequelae following unspecified cerebrovascular disease: Secondary | ICD-10-CM | POA: Diagnosis not present

## 2014-02-27 DIAGNOSIS — M179 Osteoarthritis of knee, unspecified: Secondary | ICD-10-CM | POA: Diagnosis not present

## 2014-02-27 DIAGNOSIS — I10 Essential (primary) hypertension: Secondary | ICD-10-CM | POA: Diagnosis not present

## 2014-03-05 ENCOUNTER — Ambulatory Visit: Payer: Medicare Other

## 2014-03-05 ENCOUNTER — Ambulatory Visit: Payer: Medicare Other | Admitting: Oncology

## 2014-03-05 ENCOUNTER — Other Ambulatory Visit: Payer: Medicare Other

## 2014-03-07 DIAGNOSIS — E11359 Type 2 diabetes mellitus with proliferative diabetic retinopathy without macular edema: Secondary | ICD-10-CM | POA: Diagnosis not present

## 2014-03-13 ENCOUNTER — Telehealth: Payer: Self-pay | Admitting: Nurse Practitioner

## 2014-03-13 NOTE — Telephone Encounter (Signed)
, °

## 2014-03-16 ENCOUNTER — Telehealth: Payer: Self-pay | Admitting: Nurse Practitioner

## 2014-03-16 NOTE — Telephone Encounter (Signed)
pts driver cld to verify appt/pt was confused becasue time & date was chgd-driver understood

## 2014-03-22 ENCOUNTER — Ambulatory Visit: Payer: Medicare Other | Admitting: Endocrinology

## 2014-03-26 DIAGNOSIS — M79674 Pain in right toe(s): Secondary | ICD-10-CM | POA: Diagnosis not present

## 2014-03-26 DIAGNOSIS — E1165 Type 2 diabetes mellitus with hyperglycemia: Secondary | ICD-10-CM | POA: Diagnosis not present

## 2014-03-26 DIAGNOSIS — B351 Tinea unguium: Secondary | ICD-10-CM | POA: Diagnosis not present

## 2014-03-26 DIAGNOSIS — S9031XA Contusion of right foot, initial encounter: Secondary | ICD-10-CM | POA: Diagnosis not present

## 2014-03-26 DIAGNOSIS — M79675 Pain in left toe(s): Secondary | ICD-10-CM | POA: Diagnosis not present

## 2014-03-28 ENCOUNTER — Ambulatory Visit: Payer: Medicare Other

## 2014-03-28 ENCOUNTER — Ambulatory Visit: Payer: Medicare Other | Admitting: Oncology

## 2014-03-28 ENCOUNTER — Other Ambulatory Visit: Payer: Medicare Other

## 2014-03-29 ENCOUNTER — Ambulatory Visit: Payer: Medicare Other

## 2014-03-29 ENCOUNTER — Ambulatory Visit (HOSPITAL_BASED_OUTPATIENT_CLINIC_OR_DEPARTMENT_OTHER): Payer: Medicare Other | Admitting: Nurse Practitioner

## 2014-03-29 ENCOUNTER — Encounter: Payer: Self-pay | Admitting: Nurse Practitioner

## 2014-03-29 ENCOUNTER — Ambulatory Visit: Payer: Medicare Other | Admitting: Oncology

## 2014-03-29 ENCOUNTER — Other Ambulatory Visit (HOSPITAL_BASED_OUTPATIENT_CLINIC_OR_DEPARTMENT_OTHER): Payer: Medicare Other

## 2014-03-29 ENCOUNTER — Other Ambulatory Visit: Payer: Medicare Other

## 2014-03-29 ENCOUNTER — Telehealth: Payer: Self-pay | Admitting: Oncology

## 2014-03-29 VITALS — BP 118/54 | HR 52 | Temp 98.3°F | Resp 18 | Wt 183.7 lb

## 2014-03-29 DIAGNOSIS — C50412 Malignant neoplasm of upper-outer quadrant of left female breast: Secondary | ICD-10-CM | POA: Diagnosis not present

## 2014-03-29 DIAGNOSIS — C50912 Malignant neoplasm of unspecified site of left female breast: Secondary | ICD-10-CM

## 2014-03-29 DIAGNOSIS — E119 Type 2 diabetes mellitus without complications: Secondary | ICD-10-CM

## 2014-03-29 DIAGNOSIS — D631 Anemia in chronic kidney disease: Secondary | ICD-10-CM

## 2014-03-29 DIAGNOSIS — N183 Chronic kidney disease, stage 3 (moderate): Secondary | ICD-10-CM

## 2014-03-29 DIAGNOSIS — M899 Disorder of bone, unspecified: Secondary | ICD-10-CM

## 2014-03-29 DIAGNOSIS — Z853 Personal history of malignant neoplasm of breast: Secondary | ICD-10-CM

## 2014-03-29 DIAGNOSIS — Z794 Long term (current) use of insulin: Secondary | ICD-10-CM | POA: Diagnosis not present

## 2014-03-29 DIAGNOSIS — K5900 Constipation, unspecified: Secondary | ICD-10-CM | POA: Insufficient documentation

## 2014-03-29 DIAGNOSIS — N189 Chronic kidney disease, unspecified: Secondary | ICD-10-CM

## 2014-03-29 LAB — CBC WITH DIFFERENTIAL/PLATELET
BASO%: 0.7 % (ref 0.0–2.0)
Basophils Absolute: 0.1 10*3/uL (ref 0.0–0.1)
EOS ABS: 0.1 10*3/uL (ref 0.0–0.5)
EOS%: 1.5 % (ref 0.0–7.0)
HCT: 32 % — ABNORMAL LOW (ref 34.8–46.6)
HEMOGLOBIN: 9.8 g/dL — AB (ref 11.6–15.9)
LYMPH%: 17 % (ref 14.0–49.7)
MCH: 23.9 pg — ABNORMAL LOW (ref 25.1–34.0)
MCHC: 30.6 g/dL — ABNORMAL LOW (ref 31.5–36.0)
MCV: 78.1 fL — ABNORMAL LOW (ref 79.5–101.0)
MONO#: 0.6 10*3/uL (ref 0.1–0.9)
MONO%: 7.8 % (ref 0.0–14.0)
NEUT#: 5.2 10*3/uL (ref 1.5–6.5)
NEUT%: 73 % (ref 38.4–76.8)
Platelets: 244 10*3/uL (ref 145–400)
RBC: 4.1 10*6/uL (ref 3.70–5.45)
RDW: 13.9 % (ref 11.2–14.5)
WBC: 7.1 10*3/uL (ref 3.9–10.3)
lymph#: 1.2 10*3/uL (ref 0.9–3.3)

## 2014-03-29 MED ORDER — DARBEPOETIN ALFA 300 MCG/0.6ML IJ SOSY
300.0000 ug | PREFILLED_SYRINGE | Freq: Once | INTRAMUSCULAR | Status: AC
  Administered 2014-03-29: 300 ug via SUBCUTANEOUS
  Filled 2014-03-29: qty 0.6

## 2014-03-29 NOTE — Progress Notes (Signed)
OFFICE PROGRESS NOTE   03/29/2014   Physicians:Cammie Fulp (PCP), Ruffin Frederick, H.Dalbert Batman   INTERVAL HISTORY:  Patient is seen, together with social worker, in scheduled follow up of anemia related to CKD 3 for which she is on Aranesp, and  left breast cancer now on observation since she completed 5 years of Femara in 06-2013.   Ms. Tauzin returns today for follow up of her breast cancer and anemia. She completed femara in June of last year. She continues her aranesp injections every 8 weeks to keep her hgb between 10 and 11. Her hgb is 9.8 today, so of course she will receive the injection. She continues on iron supplements daily as well. As far as her breast cancer is concerned, she is doing well. She has infrequent shooting pains to her mastectomy site, but no other issues. She is up to date on her mammogram.    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 Femara begun ~ June 2010.      Breast cancer, left breast   10/07/2012 Initial Diagnosis Breast cancer, left breast  Patient was diagnosed with multicentric invasive ductal carcinoma of left breast after abnormality found on screening mammogram 05-18-2008. This was intermediate grade, ER positive 100%, PR + 70%, HER 2 negative. She had mastectomy with sentinel node evaluation, with 2 separate lesions, one 1.7 cm and the other 0.7 cm, margins clear and 2 sentinel nodes involved + one additional node negative. Patient declined radiation but has been on Femara since ~ June 2010. Dr Truddie Coco has seen her ~ every 6 months. In this EMR she has reports of CXR in 2010 and CT head in 03-2010 (refused staging studies at diagnosis).   Review of systems: Ms. Hilgeman is wearing a knee brace to her left knee because of osteoarthritis. She is able to move short distances with a walker. Her second  complaint is constipation. She will sometimes go 2-3 days before a bowel movement and they are often times hard. Her appetite is poor and she is losing weight. A detailed review of systems is otherwise stable.  Objective:  Vital signs in last 24 hours:  BP 118/54 mmHg  Pulse 52  Temp(Src) 98.3 F (36.8 C) (Oral)  Resp 18  Wt 183 lb 11.2 oz (83.326 kg)  SpO2 100% She has lost 7lb since her last visit   Skin: warm, dry  HEENT: sclerae anicteric, conjunctivae pink, oropharynx clear. No thrush or mucositis.  Lymph Nodes: No cervical or supraclavicular lymphadenopathy  Lungs: clear to auscultation bilaterally, no rales, wheezes, or rhonci  Heart: regular rate and rhythm  Abdomen: round, soft, non tender, positive bowel sounds  Musculoskeletal: No focal spinal tenderness, +1 edema to left knee Neuro: non focal, well oriented, positive affect  Breasts: left breast status post mastectomy. No evidence of recurrent disease. Left axilla benign. Right breast unremarkable.   Lab Results:  Results for orders placed or performed in visit on 03/29/14  CBC with Differential  Result Value Ref Range   WBC 7.1 3.9 - 10.3 10e3/uL   NEUT# 5.2 1.5 - 6.5 10e3/uL   HGB 9.8 (L) 11.6 - 15.9 g/dL   HCT 32.0 (L) 34.8 - 46.6 %   Platelets 244 145 - 400 10e3/uL   MCV 78.1 (L) 79.5 - 101.0 fL   MCH 23.9 (L) 25.1 - 34.0  pg   MCHC 30.6 (L) 31.5 - 36.0 g/dL   RBC 4.10 3.70 - 5.45 10e6/uL   RDW 13.9 11.2 - 14.5 %   lymph# 1.2 0.9 - 3.3 10e3/uL   MONO# 0.6 0.1 - 0.9 10e3/uL   Eosinophils Absolute 0.1 0.0 - 0.5 10e3/uL   Basophils Absolute 0.1 0.0 - 0.1 10e3/uL   NEUT% 73.0 38.4 - 76.8 %   LYMPH% 17.0 14.0 - 49.7 %   MONO% 7.8 0.0 - 14.0 %   EOS% 1.5 0.0 - 7.0 %   BASO% 0.7 0.0 - 2.0 %     Studies/Results:  No results found.  Most recent mammogram on 07/28/13 was unremarkable. Most recent bone density scan on 07/14/12 showed a t-score of -1.8 (osteopenia)   Medications: I have reviewed the patient's  current medications.   Assessment/Plan:  1. Left breast cancer T1N1 ER/PR +, HER 2 negative, 2 sentinel nodes involved: out almost 4 years from diagnosis, post mastectomy with 3 node axillary evaluation. She will completed 5 years of Femara in June 2015 and will follow on observation now. She is up to date on mammograms. 2.osteopenia progressive on aromatase inhibitor. She does have calcium bid listed on medications 3.anemia secondary to chronic kidney disease and chronic disease: Aranesp will continue every other moth to keep hgb >= approximately 10. Will continue on iron supplements daily 4.degenerative arthritis, very limited mobility. She does walk with walker in home.  5.poor visual acuity  6.chronic tremor  7.diabetes on insulin: followed by Dr Dwyane Dee  8.strong FH heart disease 9.CKD 3: GFR - 46.01on 12/21/13 10. She had flu vaccine 10-13-13 11. Constipation - advised patient to take colace and miralax daily with the goal of 1 soft formed stool at least every other day   Laurie Panda, NP   03/29/2014, 2:11 PM

## 2014-03-29 NOTE — Progress Notes (Signed)
Aranesp injection given by desk nurse. 

## 2014-03-29 NOTE — Patient Instructions (Signed)
Darbepoetin Alfa injection What is this medicine? DARBEPOETIN ALFA (dar be POE e tin AL fa) helps your body make more red blood cells. It is used to treat anemia caused by chronic kidney failure and chemotherapy. This medicine may be used for other purposes; ask your health care provider or pharmacist if you have questions. COMMON BRAND NAME(S): Aranesp What should I tell my health care provider before I take this medicine? They need to know if you have any of these conditions: -blood clotting disorders or history of blood clots -cancer patient not on chemotherapy -cystic fibrosis -heart disease, such as angina, heart failure, or a history of a heart attack -hemoglobin level of 12 g/dL or greater -high blood pressure -low levels of folate, iron, or vitamin B12 -seizures -an unusual or allergic reaction to darbepoetin, erythropoietin, albumin, hamster proteins, latex, other medicines, foods, dyes, or preservatives -pregnant or trying to get pregnant -breast-feeding How should I use this medicine? This medicine is for injection into a vein or under the skin. It is usually given by a health care professional in a hospital or clinic setting. If you get this medicine at home, you will be taught how to prepare and give this medicine. Do not shake the solution before you withdraw a dose. Use exactly as directed. Take your medicine at regular intervals. Do not take your medicine more often than directed. It is important that you put your used needles and syringes in a special sharps container. Do not put them in a trash can. If you do not have a sharps container, call your pharmacist or healthcare provider to get one. Talk to your pediatrician regarding the use of this medicine in children. While this medicine may be used in children as young as 1 year for selected conditions, precautions do apply. Overdosage: If you think you have taken too much of this medicine contact a poison control center or  emergency room at once. NOTE: This medicine is only for you. Do not share this medicine with others. What if I miss a dose? If you miss a dose, take it as soon as you can. If it is almost time for your next dose, take only that dose. Do not take double or extra doses. What may interact with this medicine? Do not take this medicine with any of the following medications: -epoetin alfa This list may not describe all possible interactions. Give your health care provider a list of all the medicines, herbs, non-prescription drugs, or dietary supplements you use. Also tell them if you smoke, drink alcohol, or use illegal drugs. Some items may interact with your medicine. What should I watch for while using this medicine? Visit your prescriber or health care professional for regular checks on your progress and for the needed blood tests and blood pressure measurements. It is especially important for the doctor to make sure your hemoglobin level is in the desired range, to limit the risk of potential side effects and to give you the best benefit. Keep all appointments for any recommended tests. Check your blood pressure as directed. Ask your doctor what your blood pressure should be and when you should contact him or her. As your body makes more red blood cells, you may need to take iron, folic acid, or vitamin B supplements. Ask your doctor or health care provider which products are right for you. If you have kidney disease continue dietary restrictions, even though this medication can make you feel better. Talk with your doctor or health   care professional about the foods you eat and the vitamins that you take. What side effects may I notice from receiving this medicine? Side effects that you should report to your doctor or health care professional as soon as possible: -allergic reactions like skin rash, itching or hives, swelling of the face, lips, or tongue -breathing problems -changes in vision -chest  pain -confusion, trouble speaking or understanding -feeling faint or lightheaded, falls -high blood pressure -muscle aches or pains -pain, swelling, warmth in the leg -rapid weight gain -severe headaches -sudden numbness or weakness of the face, arm or leg -trouble walking, dizziness, loss of balance or coordination -seizures (convulsions) -swelling of the ankles, feet, hands -unusually weak or tired Side effects that usually do not require medical attention (report to your doctor or health care professional if they continue or are bothersome): -diarrhea -fever, chills (flu-like symptoms) -headaches -nausea, vomiting -redness, stinging, or swelling at site where injected This list may not describe all possible side effects. Call your doctor for medical advice about side effects. You may report side effects to FDA at 1-800-FDA-1088. Where should I keep my medicine? Keep out of the reach of children. Store in a refrigerator between 2 and 8 degrees C (36 and 46 degrees F). Do not freeze. Do not shake. Throw away any unused portion if using a single-dose vial. Throw away any unused medicine after the expiration date. NOTE: This sheet is a summary. It may not cover all possible information. If you have questions about this medicine, talk to your doctor, pharmacist, or health care provider.  2015, [employer] Standard. (2007-12-20 10:23:57)  

## 2014-03-29 NOTE — Telephone Encounter (Signed)
appts made and avs printed for pt  [name] °

## 2014-04-18 ENCOUNTER — Encounter: Payer: Self-pay | Admitting: Endocrinology

## 2014-04-18 ENCOUNTER — Ambulatory Visit (INDEPENDENT_AMBULATORY_CARE_PROVIDER_SITE_OTHER): Payer: Medicare Other | Admitting: Endocrinology

## 2014-04-18 VITALS — BP 130/80 | HR 50 | Temp 97.9°F | Wt 181.0 lb

## 2014-04-18 DIAGNOSIS — E038 Other specified hypothyroidism: Secondary | ICD-10-CM

## 2014-04-18 DIAGNOSIS — I10 Essential (primary) hypertension: Secondary | ICD-10-CM

## 2014-04-18 DIAGNOSIS — E063 Autoimmune thyroiditis: Secondary | ICD-10-CM

## 2014-04-18 DIAGNOSIS — E119 Type 2 diabetes mellitus without complications: Secondary | ICD-10-CM | POA: Diagnosis not present

## 2014-04-18 DIAGNOSIS — N289 Disorder of kidney and ureter, unspecified: Secondary | ICD-10-CM

## 2014-04-18 DIAGNOSIS — E1165 Type 2 diabetes mellitus with hyperglycemia: Secondary | ICD-10-CM | POA: Diagnosis not present

## 2014-04-18 DIAGNOSIS — IMO0002 Reserved for concepts with insufficient information to code with codable children: Secondary | ICD-10-CM

## 2014-04-18 LAB — COMPREHENSIVE METABOLIC PANEL
ALT: 7 U/L (ref 0–35)
AST: 15 U/L (ref 0–37)
Albumin: 3.3 g/dL — ABNORMAL LOW (ref 3.5–5.2)
Alkaline Phosphatase: 79 U/L (ref 39–117)
BUN: 32 mg/dL — ABNORMAL HIGH (ref 6–23)
CO2: 31 mEq/L (ref 19–32)
Calcium: 9.1 mg/dL (ref 8.4–10.5)
Chloride: 100 mEq/L (ref 96–112)
Creatinine, Ser: 1.59 mg/dL — ABNORMAL HIGH (ref 0.40–1.20)
GFR: 40.02 mL/min — ABNORMAL LOW (ref >60.00)
Glucose, Bld: 75 mg/dL (ref 70–99)
Potassium: 4.1 mEq/L (ref 3.5–5.1)
Sodium: 134 mEq/L — ABNORMAL LOW (ref 135–145)
Total Bilirubin: 0.5 mg/dL (ref 0.2–1.2)
Total Protein: 6.6 g/dL (ref 6.0–8.3)

## 2014-04-18 LAB — LIPID PANEL
Cholesterol: 95 mg/dL (ref 0–200)
HDL: 35.9 mg/dL — ABNORMAL LOW (ref >39.00)
LDL CALC: 46 mg/dL (ref 0–99)
NONHDL: 59.1
TRIGLYCERIDES: 66 mg/dL (ref 0.0–149.0)
Total CHOL/HDL Ratio: 3
VLDL: 13.2 mg/dL (ref 0.0–40.0)

## 2014-04-18 LAB — URINALYSIS, ROUTINE W REFLEX MICROSCOPIC
Bacteria, UA: NONE SEEN
Bilirubin Urine: NEGATIVE
Hgb urine dipstick: NEGATIVE
Ketones, ur: NEGATIVE
Nitrite: NEGATIVE
RBC / HPF: NONE SEEN (ref 0–?)
Specific Gravity, Urine: 1.015 (ref 1.000–1.030)
Total Protein, Urine: NEGATIVE
Urine Glucose: NEGATIVE
Urobilinogen, UA: 0.2 (ref 0.0–1.0)
pH: 5.5 (ref 5.0–8.0)

## 2014-04-18 LAB — MICROALBUMIN / CREATININE URINE RATIO
Creatinine,U: 128.1 mg/dL
Microalb Creat Ratio: 4 mg/g (ref 0.0–30.0)
Microalb, Ur: 5.1 mg/dL — ABNORMAL HIGH (ref 0.0–1.9)

## 2014-04-18 LAB — TSH: TSH: 4.26 u[IU]/mL (ref 0.35–4.50)

## 2014-04-18 LAB — HEMOGLOBIN A1C: Hgb A1c MFr Bld: 5.1 % (ref 4.6–6.5)

## 2014-04-18 LAB — T4, FREE: Free T4: 1.21 ng/dL (ref 0.60–1.60)

## 2014-04-18 NOTE — Progress Notes (Signed)
Patient ID: [name], female   DOB: [birth year], 81 y.o.   MRN: [id number]   Reason for Appointment:  follow-up   History of Present Illness   Diagnosis: Type 2 DIABETES MELITUS, long-standing  She has been on insulin for several years and most of the time has been on NPH or premixed  insulin for convenience and simplicity In 3329 because of hypoglycemia overnight her evening insulin was stopped Probably because of decreased intake and weight loss her insulin requirement has been progressively less  Recent history:    She continues to be taking only one injection of premixed insulin in the morning for her diabetes   She has had excellent control overall considering her age and variability in her diet.  Her A1c is generally in the upper normal range.  She now says that she is usually not eating a full lunch and mostly snack like peanut butter crackers.   Her blood sugars tend to be low normal in the late afternoon as a consequence. Has checked her sugar fairly consistently at various times Takes her insulin about 30 minutes before eating usually and her syringes are being treated by her daughter  Hypoglycemia: None recently Insulin regimen: Humulin 70/30:   20 units in the morning before breakfast, usually compliant with his               Monitors blood glucose:  2.5 times a day.    Glucometer: One Touch.          Blood Glucose reading analysis from meter download:   PRE-MEAL Breakfast Lunch Dinner  7-9 PM Overall  Glucose range:   93-156  93-178  78-144  103-180   Mean/median:  126     125    Meals:  2-3 meals per day, variable times. Dinner 6 pm          Wt Readings from Last 3 Encounters:  04/18/14 181 lb (82.101 kg)  03/29/14 183 lb 11.2 oz (83.326 kg)  11/08/13 190 lb 6.4 oz (86.365 kg)   Lab Results  Component Value Date   HGBA1C 6.0 12/21/2013   HGBA1C 5.6 09/21/2013   HGBA1C 5.2 06/21/2013   Lab Results  Component Value Date   MICROALBUR 8.4* 08/29/2012    LDLCALC 38 03/21/2013   CREATININE 1.4* 12/21/2013        Medication List       This list is accurate as of: 04/18/14  3:26 PM.  Always use your most recent med list.               B-complex with vitamin C tablet  Take 1 tablet by mouth daily.     CALCIUM 600 600 MG Tabs tablet  Generic drug:  calcium carbonate  Take 600 mg by mouth 2 (two) times daily with a meal.     diazepam 5 MG tablet  Commonly known as:  VALIUM  Take 5 mg by mouth every 6 (six) hours as needed.     docusate sodium 100 MG capsule  Commonly known as:  COLACE  Take 100 mg by mouth daily as needed for mild constipation.     doxazosin 2 MG tablet  Commonly known as:  CARDURA  TAKE ONE TABLET BY MOUTH AT BEDTIME     HUMULIN 70/30 (70-30) 100 UNIT/ML injection  Generic drug:  insulin NPH-regular Human  INJECT 28 UNITS UNDER THE SKIN DAILY WITH BREAKFAST     Hydrocodone-Acetaminophen 10-660 MG Tabs  10-325 mg.  indapamide 1.25 MG tablet  Commonly known as:  LOZOL  TAKE ONE TABLET BY MOUTH IN THE MORNING     INSULIN SYRINGE 1CC/29G 29G X 1/2" 1 ML Misc  by Does not apply route.     INTEGRA PLUS PO  Take 2 tablets by mouth daily.     irbesartan 150 MG tablet  Commonly known as:  AVAPRO  Take 1 tablet (150 mg total) by mouth daily.     levothyroxine 88 MCG tablet  Commonly known as:  SYNTHROID, LEVOTHROID  TAKE ONE TABLET BY MOUTH ONCE DAILY     metoprolol succinate 50 MG 24 hr tablet  Commonly known as:  TOPROL-XL     olmesartan 40 MG tablet  Commonly known as:  BENICAR  Take 40 mg by mouth daily.     PERIDIN-C PO  Take by mouth.     potassium chloride SA 20 MEQ tablet  Commonly known as:  K-DUR,KLOR-CON  TAKE ONE TABLET BY MOUTH ONCE DAILY     potassium chloride SA 20 MEQ tablet  Commonly known as:  K-DUR,KLOR-CON  TAKE ONE TABLET BY MOUTH ONCE DAILY     rOPINIRole 3 MG tablet  Commonly known as:  REQUIP     vitamin C 1000 MG tablet  Take 1,000 mg by mouth daily.      Vitamin D 2000 UNITS tablet  Take 2,000 Units by mouth daily.        Allergies:  Allergies  Allergen Reactions  . Aspirin   . Insulins     NOVA?  Marland Kitchen Penicillins     Past Medical History  Diagnosis Date  . Hemorrhoid   . Anemia   . Arthritis     Past Surgical History  Procedure Laterality Date  . Breast lumpectomy      LEFT  . Abdominal hysterectomy      PARTIAL  . Mastectomy  05/2008    Left    No family history on file.  Social History:  reports that she has never smoked. She does not have any smokeless tobacco history on file. She reports that she does not drink alcohol. Her drug history is not on file.  Review of Systems:  HYPERTENSION: This is long-standing and has been well controlled  Her doxazosin was  Stopped on her last visit because of relatively low blood pressure  HYPOTHYROIDISM: She has had long-standing hypothyroidism and the dose has been  Consistent since about 03/2013  Lab Results  Component Value Date   TSH 1.19 09/21/2013   TSH 0.37 06/21/2013   She has had chronic bilateral leg weakness and is in a wheelchair but can use a walker at home  No history of hypercholesterolemia , needs follow-up level  Lab Results  Component Value Date   CHOL 92 03/21/2013   HDL 46.00 03/21/2013   LDLCALC 38 03/21/2013   TRIG 41.0 03/21/2013   CHOLHDL 2 03/21/2013    History of breast cancer, followed by oncologist Also followed for anemia by oncologist and is going to be seen every 2 months now    Foot exam in 6/15 showed normal monofilament sensation, absent pedal pulses, no deformities or calluses   She is asking about left shoulder pain recently   She has anemia followed by hematologist   No complaints of palpitations   Examination:   BP 130/80 mmHg  Pulse 50  Temp(Src) 97.9 F (36.6 C) (Oral)  Wt 181 lb (82.101 kg)  SpO2 95%  Body mass index is  29.23 kg/(m^2).     she appears anxious Heart sounds normal No pedal edema  ASSESSMENT/  PLAN:   Diabetes type 2:   The patient's diabetes appears very well controlled with average blood sugar 125 at home including readings at all different times  She is getting low normal readings at 3-6 PM and will need to reduce her morning dose at least by 2 units again.  She was encouraged to eat a proper meal at lunch instead of just a snack  A1c to be checked again  Hypertension: Her blood pressure is well controlled with stopping her doxazosin   Thyroid level to be checked again for her hypothyroidism    she will follow-up with her PCP for shoulder pain  [name] 04/18/2014, 3:26 PM      Addendum: labs show A1c 5.1 , excellent   Serum creatinine 1.6 , electrolytes normal  She will reduce her Avapro to half a tablet But may consider stopping it if creatinine continues to increase.   no microalbuminuria  she will continue same dose of thyroid supplement as TSH is normal  Office Visit on 04/18/2014  Component Date Value Ref Range Status  . Hgb A1c MFr Bld 04/18/2014 5.1  4.6 - 6.5 % Final   Glycemic Control Guidelines for People with Diabetes:Non Diabetic:  <6%Goal of Therapy: <7%Additional Action Suggested:  >8%   . Sodium 04/18/2014 134* 135 - 145 mEq/L Final  . Potassium 04/18/2014 4.1  3.5 - 5.1 mEq/L Final  . Chloride 04/18/2014 100  96 - 112 mEq/L Final  . CO2 04/18/2014 31  19 - 32 mEq/L Final  . Glucose, Bld 04/18/2014 75  70 - 99 mg/dL Final  . BUN 04/18/2014 32* 6 - 23 mg/dL Final  . Creatinine, Ser 04/18/2014 1.59* 0.40 - 1.20 mg/dL Final  . Total Bilirubin 04/18/2014 0.5  0.2 - 1.2 mg/dL Final  . Alkaline Phosphatase 04/18/2014 79  39 - 117 U/L Final  . AST 04/18/2014 15  0 - 37 U/L Final  . ALT 04/18/2014 7  0 - 35 U/L Final  . Total Protein 04/18/2014 6.6  6.0 - 8.3 g/dL Final  . Albumin 04/18/2014 3.3* 3.5 - 5.2 g/dL Final  . Calcium 04/18/2014 9.1  8.4 - 10.5 mg/dL Final  . GFR 04/18/2014 40.02* >60.00 mL/min Final  . Cholesterol 04/18/2014 95  0 - 200  mg/dL Final   ATP III Classification       Desirable:  < 200 mg/dL               Borderline High:  200 - 239 mg/dL          High:  > = 240 mg/dL  . Triglycerides 04/18/2014 66.0  0.0 - 149.0 mg/dL Final   Normal:  <150 mg/dLBorderline High:  150 - 199 mg/dL  . HDL 04/18/2014 35.90* >39.00 mg/dL Final  . VLDL 04/18/2014 13.2  0.0 - 40.0 mg/dL Final  . LDL Cholesterol 04/18/2014 46  0 - 99 mg/dL Final  . Total CHOL/HDL Ratio 04/18/2014 3   Final                  Men          Women1/2 Average Risk     3.4          3.3Average Risk          5.0          4.42X Average Risk  9.6          7.13X Average Risk          15.0          11.0                      . NonHDL 04/18/2014 59.10   Final   NOTE:  Non-HDL goal should be 30 mg/dL higher than patient's LDL goal (i.e. LDL goal of < 70 mg/dL, would have non-HDL goal of < 100 mg/dL)  . TSH 04/18/2014 4.26  0.35 - 4.50 uIU/mL Final  . Free T4 04/18/2014 1.21  0.60 - 1.60 ng/dL Final  . Microalb, Ur 04/18/2014 5.1* 0.0 - 1.9 mg/dL Final  . Creatinine,U 04/18/2014 128.1   Final  . Microalb Creat Ratio 04/18/2014 4.0  0.0 - 30.0 mg/g Final  . Color, Urine 04/18/2014 YELLOW  Yellow;Lt. Yellow Final  . APPearance 04/18/2014 CLEAR  Clear Final  . Specific Gravity, Urine 04/18/2014 1.015  1.000-1.030 Final  . pH 04/18/2014 5.5  5.0 - 8.0 Final  . Total Protein, Urine 04/18/2014 NEGATIVE  Negative Final  . Urine Glucose 04/18/2014 NEGATIVE  Negative Final  . Ketones, ur 04/18/2014 NEGATIVE  Negative Final  . Bilirubin Urine 04/18/2014 NEGATIVE  Negative Final  . Hgb urine dipstick 04/18/2014 NEGATIVE  Negative Final  . Urobilinogen, UA 04/18/2014 0.2  0.0 - 1.0 Final  . Leukocytes, UA 04/18/2014 SMALL* Negative Final  . Nitrite 04/18/2014 NEGATIVE  Negative Final  . WBC, UA 04/18/2014 11-20/hpf* 0-2/hpf Final  . RBC / HPF 04/18/2014 none seen  0-2/hpf Final  . Bacteria, UA 04/18/2014 none seen  None Final

## 2014-04-18 NOTE — Patient Instructions (Addendum)
Reduce insulin to 18 units units, take 30 min before breakfast  Please check blood sugars at least half the time about 2 hours after any meal and 3-4  times per week on waking up.  Please bring blood sugar monitor to each visit. Recommended blood sugar levels about 2 hours after meal is 140-180 and on waking up 90-130  Eat at least 1/2 sandwich at lunch daily

## 2014-04-19 NOTE — Progress Notes (Signed)
Quick Note:  Please let Her daughter know that the kidney test is slightly worse, need to reduce her irbesartan to half tablet. Make sure she is not taking Benicar.  thyroid and diabetes tests are okay  She needs to have kidney test checked with PCP again in the next couple of months  ______

## 2014-04-24 ENCOUNTER — Other Ambulatory Visit: Payer: Self-pay | Admitting: *Deleted

## 2014-04-24 MED ORDER — INSULIN NPH ISOPHANE & REGULAR (70-30) 100 UNIT/ML ~~LOC~~ SUSP: SUBCUTANEOUS | Status: DC

## 2014-05-23 ENCOUNTER — Other Ambulatory Visit: Payer: Self-pay | Admitting: *Deleted

## 2014-05-24 ENCOUNTER — Ambulatory Visit (HOSPITAL_BASED_OUTPATIENT_CLINIC_OR_DEPARTMENT_OTHER): Payer: Medicare Other

## 2014-05-24 ENCOUNTER — Other Ambulatory Visit (HOSPITAL_BASED_OUTPATIENT_CLINIC_OR_DEPARTMENT_OTHER): Payer: Medicare Other

## 2014-05-24 ENCOUNTER — Other Ambulatory Visit: Payer: Self-pay | Admitting: Oncology

## 2014-05-24 VITALS — BP 110/53 | HR 48 | Temp 97.7°F

## 2014-05-24 DIAGNOSIS — N189 Chronic kidney disease, unspecified: Secondary | ICD-10-CM

## 2014-05-24 DIAGNOSIS — D631 Anemia in chronic kidney disease: Secondary | ICD-10-CM | POA: Diagnosis not present

## 2014-05-24 LAB — CBC WITH DIFFERENTIAL/PLATELET
BASO%: 0.9 % (ref 0.0–2.0)
Basophils Absolute: 0.1 10*3/uL (ref 0.0–0.1)
EOS%: 2.3 % (ref 0.0–7.0)
Eosinophils Absolute: 0.2 10*3/uL (ref 0.0–0.5)
HCT: 30.7 % — ABNORMAL LOW (ref 34.8–46.6)
HGB: 9.5 g/dL — ABNORMAL LOW (ref 11.6–15.9)
LYMPH%: 15.9 % (ref 14.0–49.7)
MCH: 23.7 pg — AB (ref 25.1–34.0)
MCHC: 30.8 g/dL — ABNORMAL LOW (ref 31.5–36.0)
MCV: 76.8 fL — ABNORMAL LOW (ref 79.5–101.0)
MONO#: 0.5 10*3/uL (ref 0.1–0.9)
MONO%: 6.9 % (ref 0.0–14.0)
NEUT%: 74 % (ref 38.4–76.8)
NEUTROS ABS: 5.6 10*3/uL (ref 1.5–6.5)
PLATELETS: 272 10*3/uL (ref 145–400)
RBC: 4 10*6/uL (ref 3.70–5.45)
RDW: 14.9 % — AB (ref 11.2–14.5)
WBC: 7.6 10*3/uL (ref 3.9–10.3)
lymph#: 1.2 10*3/uL (ref 0.9–3.3)

## 2014-05-24 MED ORDER — DARBEPOETIN ALFA 300 MCG/0.6ML IJ SOSY
300.0000 ug | PREFILLED_SYRINGE | Freq: Once | INTRAMUSCULAR | Status: AC
  Administered 2014-05-24: 300 ug via SUBCUTANEOUS
  Filled 2014-05-24: qty 0.6

## 2014-05-24 NOTE — Patient Instructions (Signed)
Darbepoetin Alfa injection What is this medicine? DARBEPOETIN ALFA (dar be POE e tin AL fa) helps your body make more red blood cells. It is used to treat anemia caused by chronic kidney failure and chemotherapy. This medicine may be used for other purposes; ask your health care provider or pharmacist if you have questions. COMMON BRAND NAME(S): Aranesp What should I tell my health care provider before I take this medicine? They need to know if you have any of these conditions: -blood clotting disorders or history of blood clots -cancer patient not on chemotherapy -cystic fibrosis -heart disease, such as angina, heart failure, or a history of a heart attack -hemoglobin level of 12 g/dL or greater -high blood pressure -low levels of folate, iron, or vitamin B12 -seizures -an unusual or allergic reaction to darbepoetin, erythropoietin, albumin, hamster proteins, latex, other medicines, foods, dyes, or preservatives -pregnant or trying to get pregnant -breast-feeding How should I use this medicine? This medicine is for injection into a vein or under the skin. It is usually given by a health care professional in a hospital or clinic setting. If you get this medicine at home, you will be taught how to prepare and give this medicine. Do not shake the solution before you withdraw a dose. Use exactly as directed. Take your medicine at regular intervals. Do not take your medicine more often than directed. It is important that you put your used needles and syringes in a special sharps container. Do not put them in a trash can. If you do not have a sharps container, call your pharmacist or healthcare provider to get one. Talk to your pediatrician regarding the use of this medicine in children. While this medicine may be used in children as young as 1 year for selected conditions, precautions do apply. Overdosage: If you think you have taken too much of this medicine contact a poison control center or  emergency room at once. NOTE: This medicine is only for you. Do not share this medicine with others. What if I miss a dose? If you miss a dose, take it as soon as you can. If it is almost time for your next dose, take only that dose. Do not take double or extra doses. What may interact with this medicine? Do not take this medicine with any of the following medications: -epoetin alfa This list may not describe all possible interactions. Give your health care provider a list of all the medicines, herbs, non-prescription drugs, or dietary supplements you use. Also tell them if you smoke, drink alcohol, or use illegal drugs. Some items may interact with your medicine. What should I watch for while using this medicine? Visit your prescriber or health care professional for regular checks on your progress and for the needed blood tests and blood pressure measurements. It is especially important for the doctor to make sure your hemoglobin level is in the desired range, to limit the risk of potential side effects and to give you the best benefit. Keep all appointments for any recommended tests. Check your blood pressure as directed. Ask your doctor what your blood pressure should be and when you should contact him or her. As your body makes more red blood cells, you may need to take iron, folic acid, or vitamin B supplements. Ask your doctor or health care provider which products are right for you. If you have kidney disease continue dietary restrictions, even though this medication can make you feel better. Talk with your doctor or health   care professional about the foods you eat and the vitamins that you take. What side effects may I notice from receiving this medicine? Side effects that you should report to your doctor or health care professional as soon as possible: -allergic reactions like skin rash, itching or hives, swelling of the face, lips, or tongue -breathing problems -changes in vision -chest  pain -confusion, trouble speaking or understanding -feeling faint or lightheaded, falls -high blood pressure -muscle aches or pains -pain, swelling, warmth in the leg -rapid weight gain -severe headaches -sudden numbness or weakness of the face, arm or leg -trouble walking, dizziness, loss of balance or coordination -seizures (convulsions) -swelling of the ankles, feet, hands -unusually weak or tired Side effects that usually do not require medical attention (report to your doctor or health care professional if they continue or are bothersome): -diarrhea -fever, chills (flu-like symptoms) -headaches -nausea, vomiting -redness, stinging, or swelling at site where injected This list may not describe all possible side effects. Call your doctor for medical advice about side effects. You may report side effects to FDA at 1-800-FDA-1088. Where should I keep my medicine? Keep out of the reach of children. Store in a refrigerator between 2 and 8 degrees C (36 and 46 degrees F). Do not freeze. Do not shake. Throw away any unused portion if using a single-dose vial. Throw away any unused medicine after the expiration date. NOTE: This sheet is a summary. It may not cover all possible information. If you have questions about this medicine, talk to your doctor, pharmacist, or health care provider.  2015, [employer] Standard. (2007-12-20 10:23:57)  

## 2014-06-11 DIAGNOSIS — M170 Bilateral primary osteoarthritis of knee: Secondary | ICD-10-CM | POA: Diagnosis not present

## 2014-06-12 DIAGNOSIS — B351 Tinea unguium: Secondary | ICD-10-CM | POA: Diagnosis not present

## 2014-06-12 DIAGNOSIS — M2170 Unequal limb length (acquired), unspecified site: Secondary | ICD-10-CM | POA: Diagnosis not present

## 2014-06-12 DIAGNOSIS — M79609 Pain in unspecified limb: Secondary | ICD-10-CM | POA: Diagnosis not present

## 2014-06-14 ENCOUNTER — Other Ambulatory Visit: Payer: Self-pay | Admitting: Endocrinology

## 2014-06-19 ENCOUNTER — Telehealth: Payer: Self-pay | Admitting: Oncology

## 2014-06-19 NOTE — Telephone Encounter (Signed)
Returned Advertising account executive. Confirmed with transportation moved to 06/29.

## 2014-07-05 ENCOUNTER — Encounter (HOSPITAL_COMMUNITY): Payer: Self-pay | Admitting: Emergency Medicine

## 2014-07-05 ENCOUNTER — Inpatient Hospital Stay (HOSPITAL_COMMUNITY)
Admission: EM | Admit: 2014-07-05 | Discharge: 2014-07-09 | DRG: 066 | Disposition: A | Discharge status: Skilled Nursing Facility | Payer: Medicare Other | Attending: Internal Medicine | Admitting: Internal Medicine

## 2014-07-05 ENCOUNTER — Emergency Department (HOSPITAL_COMMUNITY): Payer: Medicare Other

## 2014-07-05 ENCOUNTER — Observation Stay (HOSPITAL_COMMUNITY): Payer: Medicare Other

## 2014-07-05 DIAGNOSIS — G250 Essential tremor: Secondary | ICD-10-CM | POA: Diagnosis present

## 2014-07-05 DIAGNOSIS — Z794 Long term (current) use of insulin: Secondary | ICD-10-CM

## 2014-07-05 DIAGNOSIS — R471 Dysarthria and anarthria: Secondary | ICD-10-CM

## 2014-07-05 DIAGNOSIS — IMO0002 Reserved for concepts with insufficient information to code with codable children: Secondary | ICD-10-CM

## 2014-07-05 DIAGNOSIS — I63311 Cerebral infarction due to thrombosis of right middle cerebral artery: Secondary | ICD-10-CM

## 2014-07-05 DIAGNOSIS — M1712 Unilateral primary osteoarthritis, left knee: Secondary | ICD-10-CM | POA: Diagnosis present

## 2014-07-05 DIAGNOSIS — I639 Cerebral infarction, unspecified: Secondary | ICD-10-CM | POA: Diagnosis present

## 2014-07-05 DIAGNOSIS — R4781 Slurred speech: Secondary | ICD-10-CM | POA: Diagnosis not present

## 2014-07-05 DIAGNOSIS — T451X5A Adverse effect of antineoplastic and immunosuppressive drugs, initial encounter: Secondary | ICD-10-CM

## 2014-07-05 DIAGNOSIS — E119 Type 2 diabetes mellitus without complications: Secondary | ICD-10-CM | POA: Diagnosis not present

## 2014-07-05 DIAGNOSIS — I6330 Cerebral infarction due to thrombosis of unspecified cerebral artery: Secondary | ICD-10-CM | POA: Insufficient documentation

## 2014-07-05 DIAGNOSIS — Z9012 Acquired absence of left breast and nipple: Secondary | ICD-10-CM | POA: Diagnosis present

## 2014-07-05 DIAGNOSIS — R4701 Aphasia: Secondary | ICD-10-CM | POA: Diagnosis not present

## 2014-07-05 DIAGNOSIS — N189 Chronic kidney disease, unspecified: Secondary | ICD-10-CM

## 2014-07-05 DIAGNOSIS — I6339 Cerebral infarction due to thrombosis of other cerebral artery: Principal | ICD-10-CM | POA: Diagnosis present

## 2014-07-05 DIAGNOSIS — I10 Essential (primary) hypertension: Secondary | ICD-10-CM | POA: Diagnosis present

## 2014-07-05 DIAGNOSIS — N183 Chronic kidney disease, stage 3 unspecified: Secondary | ICD-10-CM

## 2014-07-05 DIAGNOSIS — D638 Anemia in other chronic diseases classified elsewhere: Secondary | ICD-10-CM | POA: Diagnosis present

## 2014-07-05 DIAGNOSIS — E11649 Type 2 diabetes mellitus with hypoglycemia without coma: Secondary | ICD-10-CM | POA: Diagnosis present

## 2014-07-05 DIAGNOSIS — K5900 Constipation, unspecified: Secondary | ICD-10-CM

## 2014-07-05 DIAGNOSIS — D6481 Anemia due to antineoplastic chemotherapy: Secondary | ICD-10-CM

## 2014-07-05 DIAGNOSIS — E1122 Type 2 diabetes mellitus with diabetic chronic kidney disease: Secondary | ICD-10-CM | POA: Diagnosis present

## 2014-07-05 DIAGNOSIS — I6789 Other cerebrovascular disease: Secondary | ICD-10-CM | POA: Diagnosis not present

## 2014-07-05 DIAGNOSIS — I129 Hypertensive chronic kidney disease with stage 1 through stage 4 chronic kidney disease, or unspecified chronic kidney disease: Secondary | ICD-10-CM | POA: Diagnosis present

## 2014-07-05 DIAGNOSIS — Z853 Personal history of malignant neoplasm of breast: Secondary | ICD-10-CM

## 2014-07-05 DIAGNOSIS — C50912 Malignant neoplasm of unspecified site of left female breast: Secondary | ICD-10-CM

## 2014-07-05 DIAGNOSIS — M6281 Muscle weakness (generalized): Secondary | ICD-10-CM | POA: Diagnosis not present

## 2014-07-05 DIAGNOSIS — E039 Hypothyroidism, unspecified: Secondary | ICD-10-CM | POA: Diagnosis present

## 2014-07-05 DIAGNOSIS — R270 Ataxia, unspecified: Secondary | ICD-10-CM | POA: Diagnosis present

## 2014-07-05 DIAGNOSIS — Z79899 Other long term (current) drug therapy: Secondary | ICD-10-CM

## 2014-07-05 DIAGNOSIS — Z886 Allergy status to analgesic agent status: Secondary | ICD-10-CM

## 2014-07-05 DIAGNOSIS — D631 Anemia in chronic kidney disease: Secondary | ICD-10-CM

## 2014-07-05 DIAGNOSIS — Z8673 Personal history of transient ischemic attack (TIA), and cerebral infarction without residual deficits: Secondary | ICD-10-CM

## 2014-07-05 DIAGNOSIS — Z880 Allergy status to penicillin: Secondary | ICD-10-CM

## 2014-07-05 HISTORY — DX: Cerebral infarction, unspecified: I63.9

## 2014-07-05 LAB — URINALYSIS, ROUTINE W REFLEX MICROSCOPIC
BILIRUBIN URINE: NEGATIVE
GLUCOSE, UA: NEGATIVE mg/dL
KETONES UR: NEGATIVE mg/dL
NITRITE: NEGATIVE
PH: 5.5 (ref 5.0–8.0)
Protein, ur: NEGATIVE mg/dL
SPECIFIC GRAVITY, URINE: 1.013 (ref 1.005–1.030)
Urobilinogen, UA: 0.2 mg/dL (ref 0.0–1.0)

## 2014-07-05 LAB — COMPREHENSIVE METABOLIC PANEL
ALT: 9 U/L — ABNORMAL LOW (ref 14–54)
ANION GAP: 9 (ref 5–15)
AST: 23 U/L (ref 15–41)
Albumin: 2.9 g/dL — ABNORMAL LOW (ref 3.5–5.0)
Alkaline Phosphatase: 58 U/L (ref 38–126)
BUN: 25 mg/dL — AB (ref 6–20)
CALCIUM: 8.7 mg/dL — AB (ref 8.9–10.3)
CO2: 24 mmol/L (ref 22–32)
CREATININE: 1.3 mg/dL — AB (ref 0.44–1.00)
Chloride: 104 mmol/L (ref 101–111)
GFR calc Af Amer: 43 mL/min — ABNORMAL LOW (ref >60)
GFR, EST NON AFRICAN AMERICAN: 37 mL/min — AB (ref >60)
Glucose, Bld: 108 mg/dL — ABNORMAL HIGH (ref 65–99)
Potassium: 3.9 mmol/L (ref 3.5–5.1)
Sodium: 137 mmol/L (ref 135–145)
Total Bilirubin: 0.5 mg/dL (ref 0.3–1.2)
Total Protein: 6 g/dL — ABNORMAL LOW (ref 6.5–8.1)

## 2014-07-05 LAB — DIFFERENTIAL
Basophils Absolute: 0 10*3/uL (ref 0.0–0.1)
Basophils Relative: 0 % (ref 0–1)
Eosinophils Absolute: 0.1 10*3/uL (ref 0.0–0.7)
Eosinophils Relative: 1 % (ref 0–5)
Lymphocytes Relative: 14 % (ref 12–46)
Lymphs Abs: 1.4 10*3/uL (ref 0.7–4.0)
MONO ABS: 0.6 10*3/uL (ref 0.1–1.0)
Monocytes Relative: 6 % (ref 3–12)
NEUTROS PCT: 79 % — AB (ref 43–77)
Neutro Abs: 7.8 10*3/uL — ABNORMAL HIGH (ref 1.7–7.7)

## 2014-07-05 LAB — I-STAT CHEM 8, ED
BUN: 39 mg/dL — ABNORMAL HIGH (ref 6–20)
Calcium, Ion: 1.07 mmol/L — ABNORMAL LOW (ref 1.13–1.30)
Chloride: 103 mmol/L (ref 101–111)
Creatinine, Ser: 1.4 mg/dL — ABNORMAL HIGH (ref 0.44–1.00)
GLUCOSE: 113 mg/dL — AB (ref 65–99)
HEMATOCRIT: 42 % (ref 36.0–46.0)
Hemoglobin: 14.3 g/dL (ref 12.0–15.0)
POTASSIUM: 4.6 mmol/L (ref 3.5–5.1)
Sodium: 138 mmol/L (ref 135–145)
TCO2: 25 mmol/L (ref 0–100)

## 2014-07-05 LAB — CBC
HCT: 32.7 % — ABNORMAL LOW (ref 36.0–46.0)
Hemoglobin: 10.4 g/dL — ABNORMAL LOW (ref 12.0–15.0)
MCH: 24.5 pg — AB (ref 26.0–34.0)
MCHC: 31.8 g/dL (ref 30.0–36.0)
MCV: 76.9 fL — AB (ref 78.0–100.0)
Platelets: 265 10*3/uL (ref 150–400)
RBC: 4.25 MIL/uL (ref 3.87–5.11)
RDW: 14.9 % (ref 11.5–15.5)
WBC: 9.8 10*3/uL (ref 4.0–10.5)

## 2014-07-05 LAB — ETHANOL: Alcohol, Ethyl (B): <5 mg/dL (ref <5)

## 2014-07-05 LAB — PROTIME-INR
INR: 1.11 (ref 0.00–1.49)
Prothrombin Time: 14.5 seconds (ref 11.6–15.2)

## 2014-07-05 LAB — URINE MICROSCOPIC-ADD ON

## 2014-07-05 LAB — I-STAT TROPONIN, ED: TROPONIN I, POC: 0.02 ng/mL (ref 0.00–0.08)

## 2014-07-05 LAB — APTT: aPTT: 30 seconds (ref 24–37)

## 2014-07-05 LAB — CBG MONITORING, ED: Glucose-Capillary: 119 mg/dL — ABNORMAL HIGH (ref 65–99)

## 2014-07-05 MED ORDER — ROPINIROLE HCL 1 MG PO TABS
3.0000 mg | ORAL_TABLET | Freq: Every day | ORAL | Status: DC
  Administered 2014-07-06 – 2014-07-08 (×4): 3 mg via ORAL
  Filled 2014-07-05 (×4): qty 3

## 2014-07-05 MED ORDER — ACETAMINOPHEN 650 MG RE SUPP: 650.0000 mg | RECTAL | Status: DC | PRN

## 2014-07-05 MED ORDER — LEVOTHYROXINE SODIUM 88 MCG PO TABS
88.0000 ug | ORAL_TABLET | Freq: Every day | ORAL | Status: DC
  Administered 2014-07-06 – 2014-07-09 (×4): 88 ug via ORAL
  Filled 2014-07-05 (×4): qty 1

## 2014-07-05 MED ORDER — INDAPAMIDE 1.25 MG PO TABS
1.2500 mg | ORAL_TABLET | Freq: Every morning | ORAL | Status: DC
  Administered 2014-07-06 – 2014-07-09 (×3): 1.25 mg via ORAL
  Filled 2014-07-05 (×4): qty 1

## 2014-07-05 MED ORDER — METOPROLOL SUCCINATE ER 25 MG PO TB24
50.0000 mg | ORAL_TABLET | Freq: Every day | ORAL | Status: DC
Start: 2014-07-06 — End: 2014-07-09
  Administered 2014-07-07 – 2014-07-09 (×2): 50 mg via ORAL
  Filled 2014-07-05 (×4): qty 2

## 2014-07-05 MED ORDER — ACETAMINOPHEN 325 MG PO TABS: 650.0000 mg | ORAL_TABLET | ORAL | Status: DC | PRN

## 2014-07-05 MED ORDER — HEPARIN SODIUM (PORCINE) 5000 UNIT/ML IJ SOLN
5000.0000 [IU] | Freq: Three times a day (TID) | INTRAMUSCULAR | Status: DC
  Administered 2014-07-06 – 2014-07-09 (×12): 5000 [IU] via SUBCUTANEOUS
  Filled 2014-07-05 (×12): qty 1

## 2014-07-05 MED ORDER — IRBESARTAN 75 MG PO TABS
75.0000 mg | ORAL_TABLET | Freq: Every day | ORAL | Status: DC
  Administered 2014-07-06 – 2014-07-07 (×2): 75 mg via ORAL
  Filled 2014-07-05 (×3): qty 1

## 2014-07-05 MED ORDER — INSULIN ASPART 100 UNIT/ML ~~LOC~~ SOLN
0.0000 [IU] | Freq: Three times a day (TID) | SUBCUTANEOUS | Status: DC
  Administered 2014-07-06: 1 [IU] via SUBCUTANEOUS
  Administered 2014-07-06: 2 [IU] via SUBCUTANEOUS
  Administered 2014-07-07: 3 [IU] via SUBCUTANEOUS
  Administered 2014-07-07 – 2014-07-08 (×2): 1 [IU] via SUBCUTANEOUS
  Administered 2014-07-08: 3 [IU] via SUBCUTANEOUS
  Administered 2014-07-08: 1 [IU] via SUBCUTANEOUS
  Administered 2014-07-09: 2 [IU] via SUBCUTANEOUS

## 2014-07-05 MED ORDER — HYDROCODONE-ACETAMINOPHEN 10-325 MG PO TABS
1.0000 | ORAL_TABLET | Freq: Two times a day (BID) | ORAL | Status: DC | PRN
  Administered 2014-07-06: 1 via ORAL
  Filled 2014-07-05 (×2): qty 1

## 2014-07-05 NOTE — ED Provider Notes (Signed)
CSN: [id number]     Arrival date & time 07/05/14  2144 History   First MD Initiated Contact with Patient 07/05/14 2146     Chief Complaint  Patient presents with  . Code Stroke     (Consider location/radiation/quality/duration/timing/severity/associated sxs/prior Treatment) Patient is a 81 y.o. female presenting with Acute Neurological Problem.  Cerebrovascular Accident This is a new problem. The current episode started today. The problem has been gradually improving. Pertinent negatives include no abdominal pain, chest pain, chills, congestion, coughing, fatigue, fever, headaches, nausea, rash, sore throat, vomiting or weakness. Associated symptoms comments: The fascia, confusion, left-sided weakness. Patient also found to be hypoglycemic with glucose of 46 by EMS. Patient's symptoms gradually improved after D50 bolus. Nothing aggravates the symptoms. Treatments tried: D50 bolus. The treatment provided moderate relief.    Past Medical History  Diagnosis Date  . Hemorrhoid   . Anemia   . Arthritis   . Stroke    Past Surgical History  Procedure Laterality Date  . Breast lumpectomy      LEFT  . Abdominal hysterectomy      PARTIAL  . Mastectomy  05/2008    Left   No family history on file. History  Substance Use Topics  . Smoking status: Never Smoker   . Smokeless tobacco: Not on file  . Alcohol Use: No   OB History    No data available     Review of Systems  Constitutional: Negative for fever, chills, appetite change and fatigue.  HENT: Negative for congestion, ear pain, facial swelling, mouth sores and sore throat.   Eyes: Negative for visual disturbance.  Respiratory: Negative for cough, chest tightness and shortness of breath.   Cardiovascular: Negative for chest pain and palpitations.  Gastrointestinal: Negative for nausea, vomiting, abdominal pain, diarrhea and blood in stool.  Endocrine: Negative for cold intolerance and heat intolerance.  Genitourinary: Negative  for frequency, decreased urine volume and difficulty urinating.  Musculoskeletal: Negative for back pain and neck stiffness.  Skin: Negative for rash.  Neurological: Negative for dizziness, weakness, light-headedness and headaches.  All other systems reviewed and are negative.     Allergies  Aspirin; Insulins; and Penicillins  Home Medications   Prior to Admission medications   Medication Sig Start Date End Date Taking? Authorizing Provider  Ascorbic Acid (VITAMIN C) 1000 MG tablet Take 1,000 mg by mouth daily.    Historical Provider, MD  B Complex-C (B-COMPLEX WITH VITAMIN C) tablet Take 1 tablet by mouth daily.      Historical Provider, MD  Bioflavonoid Products (PERIDIN-C PO) Take by mouth.      Historical Provider, MD  calcium carbonate (CALCIUM 600) 600 MG TABS tablet Take 600 mg by mouth 2 (two) times daily with a meal.    Historical Provider, MD  Cholecalciferol (VITAMIN D) 2000 UNITS tablet Take 2,000 Units by mouth daily.    Historical Provider, MD  diazepam (VALIUM) 5 MG tablet Take 5 mg by mouth every 6 (six) hours as needed.      Historical Provider, MD  docusate sodium (COLACE) 100 MG capsule Take 100 mg by mouth daily as needed for mild constipation.    Historical Provider, MD  FeFum-FePoly-FA-B Cmp-C-Biot (INTEGRA PLUS PO) Take 2 tablets by mouth daily.  11/09/13   Historical Provider, MD  HUMULIN 70/30 (70-30) 100 UNIT/ML injection INJECT 20 UNITS UNDER THE SKIN DAILY WITH BREAKFAST 06/14/14   Elayne Snare, MD  indapamide (LOZOL) 1.25 MG tablet TAKE ONE TABLET BY MOUTH IN  THE MORNING 02/23/14   Elayne Snare, MD  INSULIN SYRINGE 1CC/29G 29G X 1/2" 1 ML MISC by Does not apply route.      Historical Provider, MD  irbesartan (AVAPRO) 150 MG tablet Take 1 tablet (150 mg total) by mouth daily. 02/23/14   Elayne Snare, MD  levothyroxine (SYNTHROID, LEVOTHROID) 88 MCG tablet TAKE ONE TABLET BY MOUTH ONCE DAILY 02/23/14   Elayne Snare, MD  metoprolol (TOPROL-XL) 50 MG 24 hr tablet  12/12/10    Historical Provider, MD  potassium chloride SA (K-DUR,KLOR-CON) 20 MEQ tablet TAKE ONE TABLET BY MOUTH ONCE DAILY 02/06/14   Elayne Snare, MD  potassium chloride SA (K-DUR,KLOR-CON) 20 MEQ tablet TAKE ONE TABLET BY MOUTH ONCE DAILY 02/23/14   Elayne Snare, MD  rOPINIRole (REQUIP) 3 MG tablet  10/20/10   Historical Provider, MD   BP 155/124 mmHg  Pulse 66  Resp 20  Ht 5\' 5"  (1.651 m)  Wt 176 lb 5.9 oz (80 kg)  BMI 29.35 kg/m2  SpO2 100% Physical Exam  Constitutional: She appears well-developed and well-nourished. No distress.  HENT:  Head: Normocephalic and atraumatic.  Right Ear: External ear normal.  Left Ear: External ear normal.  Nose: Nose normal.  Eyes: Conjunctivae and EOM are normal. Pupils are equal, round, and reactive to light. Right eye exhibits no discharge. Left eye exhibits no discharge. No scleral icterus.  Neck: Normal range of motion. Neck supple.  Cardiovascular: Normal rate, regular rhythm and normal heart sounds.  Exam reveals no gallop and no friction rub.   No murmur heard. Pulmonary/Chest: Effort normal and breath sounds normal. No stridor. No respiratory distress. She has no wheezes.  Abdominal: Soft. She exhibits no distension. There is no tenderness.  Musculoskeletal: She exhibits no edema or tenderness.  Neurological: She is alert. She has normal strength. She is disoriented. She displays no tremor. No cranial nerve deficit or sensory deficit. She exhibits normal muscle tone. Coordination (dysmetria with finger to nose (likely secondary to her vision problem)) abnormal.  Unable to name random objects.   Skin: Skin is warm and dry. No rash noted. She is not diaphoretic. No erythema.  Psychiatric: She has a normal mood and affect.    ED Course  Procedures (including critical care time) Labs Review Labs Reviewed  CBG MONITORING, ED - Abnormal; Notable for the following:    Glucose-Capillary 119 (*)    All other components within normal limits  ETHANOL   PROTIME-INR  APTT  CBC  DIFFERENTIAL  COMPREHENSIVE METABOLIC PANEL  URINE RAPID DRUG SCREEN, HOSP PERFORMED  URINALYSIS, ROUTINE W REFLEX MICROSCOPIC (NOT AT [facility])  I-STAT CHEM 8, ED  I-STAT TROPOININ, ED    Imaging Review Ct Head Wo Contrast  07/05/2014   CLINICAL DATA:  Code stroke. Right-sided gaze and garbled speech. Initial encounter.  EXAM: CT HEAD WITHOUT CONTRAST  TECHNIQUE: Contiguous axial images were obtained from the base of the skull through the vertex without intravenous contrast.  COMPARISON:  CT of the head performed 03/20/2010  FINDINGS: There is no evidence of acute infarction, mass lesion, or intra- or extra-axial hemorrhage on CT.  Prominence of the ventricles and sulci reflects moderate cortical volume loss. A large chronic infarct is noted at the left occipital and temporal lobes, with associated encephalomalacia. Mild cerebellar atrophy is noted. Scattered periventricular and subcortical white matter change likely reflects small vessel ischemic microangiopathy. Chronic lacunar infarcts are seen at the basal ganglia bilaterally.  The brainstem and fourth ventricle are within normal limits.  No mass effect or midline shift is seen.  There is no evidence of fracture; visualized osseous structures are unremarkable in appearance. The orbits are within normal limits. The paranasal sinuses and mastoid air cells are well-aerated. No significant soft tissue abnormalities are seen.  IMPRESSION: 1. No acute intracranial pathology seen to explain the patient's symptoms. 2. Moderate cortical volume loss and scattered small vessel ischemic microangiopathy. 3. Large chronic infarct at the left occipital and temporal lobes, with associated encephalomalacia. 4. Chronic lacunar infarcts at the basal ganglia bilaterally. These results were called by telephone at the time of interpretation on 07/05/2014 at 10:08 pm to Dr. Janann Colonel, who verbally acknowledged these results.   Electronically Signed   By:  Garald Balding M.D.   On: 07/05/2014 22:08     EKG Interpretation   Date/Time:  Thursday July 05 2014 22:16:17 EDT Ventricular Rate:  62 PR Interval:  186 QRS Duration: 75 QT Interval:  480 QTC Calculation: 487 R Axis:   66 Text Interpretation:  Sinus rhythm Atrial premature complex Left  ventricular hypertrophy Anterior Q waves, possibly due to LVH No  significant change since last tracing Confirmed by Debby Freiberg [id number])  on 07/05/2014 10:25:05 PM      MDM   81 year old female with a history of hypertension, diabetes, CKD who presents as a code stroke with aphasia, confusion, left-sided weakness. Patient was found by daughter this evening who called EMS. Upon EMS arrival patient noted to symptoms as above. Patient noted to be hypoglycemic with a CBG of 46. She was given D50 bolus and her symptoms gradually improved. Her main hemodynamically stable in route. On arrival code stroke initiating. CT had the not reveal any acute process. However patient still has some concerning exam findings and will be admitted to medicine for continued stroke workup. She remained hemodynamically stable with a stable glucose while in the ED and was admitted to medicine without complication.  Patient seen in conjunction with Dr. Colin Rhein  Final diagnoses:  Dysarthria        Addison Lank, MD 07/06/14 [id number]  Debby Freiberg, MD 07/08/14 [phone]

## 2014-07-05 NOTE — Consult Note (Signed)
Stroke Consult    Chief Complaint: dysarthria  HPI: [name] is an 81 y.o. female hx of DM, HTN, prior CVA, breast cancer presenting with slurred speech and confusion. Code stroke activated. LSW at 1600. Around 2015 daughter found her confused, slurred speech, noted right gaze preference. EMS arrival noted blood sugar of 46, gave glucose with repeat blood sugar of 166. They report gaze preference resolved but she continued to have slurred speech and confusion.   CT head imaging reviewed, shows no acute infarct but presence of chronic left occipital/temporal and bilateral basal ganglia infarcts. Initial NIHSS of 3 for dysarthria, limb ataxia and RLE sensory deficits  Date last known well: 07/05/2014 Time last known well: 1600 tPA Given: no, outside IV tPA window  Past Medical History  Diagnosis Date  . Hemorrhoid   . Anemia   . Arthritis   . Stroke     Past Surgical History  Procedure Laterality Date  . Breast lumpectomy      LEFT  . Abdominal hysterectomy      PARTIAL  . Mastectomy  05/2008    Left    No family history on file. Social History:  reports that she has never smoked. She does not have any smokeless tobacco history on file. She reports that she does not drink alcohol. Her drug history is not on file.  Allergies:  Allergies  Allergen Reactions  . Aspirin   . Insulins     [facility]?  Marland Kitchen Penicillins      (Not in a hospital admission)  ROS: Out of a complete 14 system review, the patient complains of only the following symptoms, and all other reviewed systems are negative. +dysarthria  Physical Examination: Filed Vitals:   07/05/14 2214  BP: 155/124  Pulse: 66  Resp: 20   Physical Exam  Constitutional: He appears well-developed and well-nourished.  Psych: Affect appropriate to situation Eyes: No scleral injection HENT: No OP obstrucion Head: Normocephalic.  Cardiovascular: Normal rate and regular rhythm.  Respiratory: Effort normal and breath  sounds normal.  GI: Soft. Bowel sounds are normal. No distension. There is no tenderness.  Skin: WDI   Neurologic Examination: Mental Status: Alert, oriented, thought content appropriate.  Speech fluent without evidence of aphasia. Mild dysarthria noted.  Able to follow 3 step commands without difficulty. Cranial Nerves: II: unable to visualize optic discs, baseline poor visual acuity (daughter reports hx of diabetic retinopathy), intermittent decreased blink to threat right superior quadrant, pupils equal, round, reactive to light  III,IV, VI: ptosis not present, extra-ocular motions intact bilaterally, no gaze preference V,VII: smile symmetric, facial light touch sensation normal bilaterally VIII: hearing normal bilaterally IX,X: gag reflex present XI: trapezius strength/neck flexion strength normal bilaterally XII: tongue strength normal  Motor: Moves all extremities symmetrically and against light resistance Tone and bulk:normal tone throughout; no atrophy noted Sensory: decreased LT RLE Deep Tendon Reflexes: 2+ and symmetric throughout Plantars: Right: downgoing   Left: downgoing Cerebellar: Ataxia of LUE with FTN Gait: deferred  Laboratory Studies:   Basic Metabolic Panel: No results for input(s): NA, K, CL, CO2, GLUCOSE, BUN, CREATININE, CALCIUM, MG, PHOS in the last 168 hours.  Liver Function Tests: No results for input(s): AST, ALT, ALKPHOS, BILITOT, PROT, ALBUMIN in the last 168 hours. No results for input(s): LIPASE, AMYLASE in the last 168 hours. No results for input(s): AMMONIA in the last 168 hours.  CBC: No results for input(s): WBC, NEUTROABS, HGB, HCT, MCV, PLT in the last 168 hours.  Cardiac  Enzymes: No results for input(s): CKTOTAL, CKMB, CKMBINDEX, TROPONINI in the last 168 hours.  BNP: Invalid input(s): POCBNP  CBG: No results for input(s): GLUCAP in the last 168 hours.  Microbiology: Results for orders placed or performed in visit on 04/23/10   Smear     Status: None   Collection Time: 04/23/10  1:20 PM  Result Value Ref Range Status   Smear Result Smear Available  Final    Coagulation Studies: No results for input(s): LABPROT, INR in the last 72 hours.  Urinalysis: No results for input(s): COLORURINE, LABSPEC, PHURINE, GLUCOSEU, HGBUR, BILIRUBINUR, KETONESUR, PROTEINUR, UROBILINOGEN, NITRITE, LEUKOCYTESUR in the last 168 hours.  Invalid input(s): APPERANCEUR  Lipid Panel:     Component Value Date/Time   CHOL 95 04/18/2014 1539   TRIG 66.0 04/18/2014 1539   HDL 35.90* 04/18/2014 1539   CHOLHDL 3 04/18/2014 1539   VLDL 13.2 04/18/2014 1539   LDLCALC 46 04/18/2014 1539    HgbA1C:  Lab Results  Component Value Date   HGBA1C 5.1 04/18/2014    Urine Drug Screen:  No results found for: LABOPIA, COCAINSCRNUR, LABBENZ, AMPHETMU, THCU, LABBARB  Alcohol Level: No results for input(s): ETH in the last 168 hours.  Other results: Imaging: Ct Head Wo Contrast  07/05/2014   CLINICAL DATA:  Code stroke. Right-sided gaze and garbled speech. Initial encounter.  EXAM: CT HEAD WITHOUT CONTRAST  TECHNIQUE: Contiguous axial images were obtained from the base of the skull through the vertex without intravenous contrast.  COMPARISON:  CT of the head performed 03/20/2010  FINDINGS: There is no evidence of acute infarction, mass lesion, or intra- or extra-axial hemorrhage on CT.  Prominence of the ventricles and sulci reflects moderate cortical volume loss. A large chronic infarct is noted at the left occipital and temporal lobes, with associated encephalomalacia. Mild cerebellar atrophy is noted. Scattered periventricular and subcortical white matter change likely reflects small vessel ischemic microangiopathy. Chronic lacunar infarcts are seen at the basal ganglia bilaterally.  The brainstem and fourth ventricle are within normal limits. No mass effect or midline shift is seen.  There is no evidence of fracture; visualized osseous structures  are unremarkable in appearance. The orbits are within normal limits. The paranasal sinuses and mastoid air cells are well-aerated. No significant soft tissue abnormalities are seen.  IMPRESSION: 1. No acute intracranial pathology seen to explain the patient's symptoms. 2. Moderate cortical volume loss and scattered small vessel ischemic microangiopathy. 3. Large chronic infarct at the left occipital and temporal lobes, with associated encephalomalacia. 4. Chronic lacunar infarcts at the basal ganglia bilaterally. These results were called by telephone at the time of interpretation on 07/05/2014 at 10:08 pm to Dr. Janann Colonel, who verbally acknowledged these results.   Electronically Signed   By: Garald Balding M.D.   On: 07/05/2014 22:08    Assessment: 81 y.o. female hx of DM, HTN, prior CVA presenting as code stroke with acute onset of slurred speech, confusion and transient right gaze preference. History pertinent for initial blood sugar of 46 upon EMS arrival. Differential includes new CVA vs seizure/post ictal vs secondary to hypoglycemia. Patient allergic to ASA.    Plan: 1.MRI brain. If positive complete the stroke workup 2. EEG   Jim Like, DO [facility] [phone]  If 7pm- 7am, please page neurology on call as listed in [place]. 07/05/2014, 10:18 PM

## 2014-07-05 NOTE — Progress Notes (Addendum)
Code stroke called on 81 y.o female found by family with right gaze, slurred speech and confusion. LSN 1600. Brought to ED per EMS. Upon arrival Pt CBG 46, Dextrose given and CBG rechecked yielding 166. CT scan completed Stat yielding chronic infarcts but no acute infarcts per Neurologist Dr. Janann Colonel. Right side gaze resolved upon arrival to ED. NIHSS completed yielding 3 for ataxia, mild sensory deficit in right leg and slurred speech. Pertinent history includes Stroke, anemia, DM, HTN. Per Neurologist Pt for MRI and if positive for new stroke to continue full stroke work up.

## 2014-07-05 NOTE — H&P (Signed)
[facility] History and Physical  Crosby Bevan WUX:[id number] DOB: 1930/09/04 DOA: 07/05/2014  Referring physician: EDP PCP: Antony Blackbird, MD   Chief Complaint: Dysarthria   HPI: [name] is a 81 y.o. female h/o HTN, DM2, prior CVA.  Patient presents to the ED with c/o slurred speech and confusion.  Code stroke activated but patients symptoms appear to be improving.  Initially daughter also describes a gaze preference (to the left daughter thinks).  EMS initially found her BGL to be low at 46, this improved with D50.  Review of Systems: Systems reviewed.  As above, otherwise negative  Past Medical History  Diagnosis Date  . Hemorrhoid   . Anemia   . Arthritis   . Stroke    Past Surgical History  Procedure Laterality Date  . Breast lumpectomy      LEFT  . Abdominal hysterectomy      PARTIAL  . Mastectomy  05/2008    Left   Social History:  reports that she has never smoked. She does not have any smokeless tobacco history on file. She reports that she does not drink alcohol. Her drug history is not on file.  Allergies  Allergen Reactions  . Aspirin Hives  . Penicillins Hives    No family history on file.   Prior to Admission medications   Medication Sig Start Date End Date Taking? Authorizing Provider  Ascorbic Acid (VITAMIN C) 1000 MG tablet Take 1,000 mg by mouth daily.   Yes Historical Provider, MD  calcium carbonate (CALCIUM 600) 600 MG TABS tablet Take 600 mg by mouth 2 (two) times daily with a meal.   Yes Historical Provider, MD  Cholecalciferol (VITAMIN D) 2000 UNITS tablet Take 2,000 Units by mouth daily.   Yes Historical Provider, MD  ferrous sulfate 325 (65 FE) MG tablet Take 325 mg by mouth 2 (two) times daily with a meal.   Yes Historical Provider, MD  HYDROcodone-acetaminophen (NORCO) 10-325 MG per tablet Take 1 tablet by mouth 2 (two) times daily as needed.  06/21/14  Yes Historical Provider, MD  indapamide (LOZOL) 1.25 MG tablet TAKE ONE TABLET  BY MOUTH IN THE MORNING 02/23/14  Yes Elayne Snare, MD  insulin NPH-regular Human (NOVOLIN 70/30) (70-30) 100 UNIT/ML injection Inject 18 Units into the skin daily with breakfast.   Yes Historical Provider, MD  irbesartan (AVAPRO) 150 MG tablet Take 1 tablet (150 mg total) by mouth daily. Patient taking differently: Take 75 mg by mouth daily.  02/23/14  Yes Elayne Snare, MD  levothyroxine (SYNTHROID, LEVOTHROID) 88 MCG tablet TAKE ONE TABLET BY MOUTH ONCE DAILY 02/23/14  Yes Elayne Snare, MD  metoprolol (TOPROL-XL) 50 MG 24 hr tablet Take 50 mg by mouth daily.  12/12/10  Yes Historical Provider, MD  potassium chloride SA (K-DUR,KLOR-CON) 20 MEQ tablet TAKE ONE TABLET BY MOUTH ONCE DAILY 02/06/14  Yes Elayne Snare, MD  rOPINIRole (REQUIP) 3 MG tablet Take 3 mg by mouth at bedtime.  10/20/10  Yes Historical Provider, MD  INSULIN SYRINGE 1CC/29G 29G X 1/2" 1 ML MISC by Does not apply route.      Historical Provider, MD   Physical Exam: Filed Vitals:   07/05/14 2300  BP: 167/79  Pulse: 64  Temp:   Resp: 19    BP 167/79 mmHg  Pulse 64  Temp(Src) 97.6 F (36.4 C)  Resp 19  Ht 5\' 5"  (1.651 m)  Wt 80 kg (176 lb 5.9 oz)  BMI 29.35 kg/m2  SpO2 100%  General  Appearance:    Alert, oriented, no distress, appears stated age  Head:    Normocephalic, atraumatic  Eyes:    PERRL, EOMI, sclera non-icteric        Nose:   Nares without drainage or epistaxis. Mucosa, turbinates normal  Throat:   Moist mucous membranes. Oropharynx without erythema or exudate.  Neck:   Supple. No carotid bruits.  No thyromegaly.  No lymphadenopathy.   Back:     No CVA tenderness, no spinal tenderness  Lungs:     Clear to auscultation bilaterally, without wheezes, rhonchi or rales  Chest wall:    No tenderness to palpitation  Heart:    Regular rate and rhythm without murmurs, gallops, rubs  Abdomen:     Soft, non-tender, nondistended, normal bowel sounds, no organomegaly  Genitalia:    deferred  Rectal:    deferred  Extremities:    No clubbing, cyanosis or edema.  Pulses:   2+ and symmetric all extremities  Skin:   Skin color, texture, turgor normal, no rashes or lesions  Lymph nodes:   Cervical, supraclavicular, and axillary nodes normal  Neurologic:   CNII-XII intact. Normal strength, sensation and reflexes      throughout    Labs on Admission:  Basic Metabolic Panel:  Recent Labs Lab 07/05/14 2227  NA 138  K 4.6  CL 103  GLUCOSE 113*  BUN 39*  CREATININE 1.40*   Liver Function Tests: No results for input(s): AST, ALT, ALKPHOS, BILITOT, PROT, ALBUMIN in the last 168 hours. No results for input(s): LIPASE, AMYLASE in the last 168 hours. No results for input(s): AMMONIA in the last 168 hours. CBC:  Recent Labs Lab 07/05/14 2216 07/05/14 2227  WBC 9.8  --   NEUTROABS 7.8*  --   HGB 10.4* 14.3  HCT 32.7* 42.0  MCV 76.9*  --   PLT 265  --    Cardiac Enzymes: No results for input(s): CKTOTAL, CKMB, CKMBINDEX, TROPONINI in the last 168 hours.  BNP (last 3 results) No results for input(s): PROBNP in the last 8760 hours. CBG:  Recent Labs Lab 07/05/14 2207  GLUCAP 119*    Radiological Exams on Admission: Ct Head Wo Contrast  07/05/2014   CLINICAL DATA:  Code stroke. Right-sided gaze and garbled speech. Initial encounter.  EXAM: CT HEAD WITHOUT CONTRAST  TECHNIQUE: Contiguous axial images were obtained from the base of the skull through the vertex without intravenous contrast.  COMPARISON:  CT of the head performed 03/20/2010  FINDINGS: There is no evidence of acute infarction, mass lesion, or intra- or extra-axial hemorrhage on CT.  Prominence of the ventricles and sulci reflects moderate cortical volume loss. A large chronic infarct is noted at the left occipital and temporal lobes, with associated encephalomalacia. Mild cerebellar atrophy is noted. Scattered periventricular and subcortical white matter change likely reflects small vessel ischemic microangiopathy. Chronic lacunar infarcts are seen  at the basal ganglia bilaterally.  The brainstem and fourth ventricle are within normal limits. No mass effect or midline shift is seen.  There is no evidence of fracture; visualized osseous structures are unremarkable in appearance. The orbits are within normal limits. The paranasal sinuses and mastoid air cells are well-aerated. No significant soft tissue abnormalities are seen.  IMPRESSION: 1. No acute intracranial pathology seen to explain the patient's symptoms. 2. Moderate cortical volume loss and scattered small vessel ischemic microangiopathy. 3. Large chronic infarct at the left occipital and temporal lobes, with associated encephalomalacia. 4. Chronic lacunar infarcts at the  basal ganglia bilaterally. These results were called by telephone at the time of interpretation on 07/05/2014 at 10:08 pm to Dr. Janann Colonel, who verbally acknowledged these results.   Electronically Signed   By: Garald Balding M.D.   On: 07/05/2014 22:08    EKG: Independently reviewed.  Assessment/Plan Principal Problem:   Aphasia Active Problems:   HTN (hypertension)   Hypothyroidism   DM2 (diabetes mellitus, type 2)   1. Aphasia - 1. MRI brain ordered, remainder of stroke work up needed if positive 2. EEG ordered 2. HTN - continue home meds 3. Hypothyroidism - continue synthroid 4. DM2 - low dose SSI ac/hs   Code Status: Full Code  Family Communication: Family at bedside Disposition Plan: Admit to obs   Time spent: 50 min  GARDNER, JARED M. [facility] Pager [phone]  If 7AM-7PM, please contact the day team taking care of the patient [email or web address] Password [email or web address] 07/05/2014, 11:19 PM

## 2014-07-05 NOTE — ED Notes (Signed)
LKW 1600 daughter visited patient at 16. EMS reported right side gaze resolved en route and slurred speech. CBG 46 dextrose 10 administered by EMS repeat CBG 167 and 140.

## 2014-07-05 NOTE — ED Notes (Signed)
Patient transported to MRI 

## 2014-07-06 ENCOUNTER — Observation Stay (HOSPITAL_COMMUNITY): Payer: Medicare Other

## 2014-07-06 DIAGNOSIS — I129 Hypertensive chronic kidney disease with stage 1 through stage 4 chronic kidney disease, or unspecified chronic kidney disease: Secondary | ICD-10-CM | POA: Diagnosis present

## 2014-07-06 DIAGNOSIS — R531 Weakness: Secondary | ICD-10-CM | POA: Diagnosis not present

## 2014-07-06 DIAGNOSIS — R270 Ataxia, unspecified: Secondary | ICD-10-CM | POA: Diagnosis present

## 2014-07-06 DIAGNOSIS — I10 Essential (primary) hypertension: Secondary | ICD-10-CM | POA: Diagnosis not present

## 2014-07-06 DIAGNOSIS — I6930 Unspecified sequelae of cerebral infarction: Secondary | ICD-10-CM | POA: Diagnosis not present

## 2014-07-06 DIAGNOSIS — Z794 Long term (current) use of insulin: Secondary | ICD-10-CM | POA: Diagnosis not present

## 2014-07-06 DIAGNOSIS — I639 Cerebral infarction, unspecified: Secondary | ICD-10-CM | POA: Diagnosis not present

## 2014-07-06 DIAGNOSIS — E1122 Type 2 diabetes mellitus with diabetic chronic kidney disease: Secondary | ICD-10-CM | POA: Diagnosis present

## 2014-07-06 DIAGNOSIS — I69820 Aphasia following other cerebrovascular disease: Secondary | ICD-10-CM | POA: Diagnosis not present

## 2014-07-06 DIAGNOSIS — E039 Hypothyroidism, unspecified: Secondary | ICD-10-CM

## 2014-07-06 DIAGNOSIS — I6330 Cerebral infarction due to thrombosis of unspecified cerebral artery: Secondary | ICD-10-CM | POA: Diagnosis not present

## 2014-07-06 DIAGNOSIS — I6789 Other cerebrovascular disease: Secondary | ICD-10-CM | POA: Diagnosis not present

## 2014-07-06 DIAGNOSIS — M25669 Stiffness of unspecified knee, not elsewhere classified: Secondary | ICD-10-CM | POA: Diagnosis not present

## 2014-07-06 DIAGNOSIS — R471 Dysarthria and anarthria: Secondary | ICD-10-CM | POA: Diagnosis present

## 2014-07-06 DIAGNOSIS — I6339 Cerebral infarction due to thrombosis of other cerebral artery: Secondary | ICD-10-CM | POA: Diagnosis present

## 2014-07-06 DIAGNOSIS — D638 Anemia in other chronic diseases classified elsewhere: Secondary | ICD-10-CM | POA: Diagnosis present

## 2014-07-06 DIAGNOSIS — R4701 Aphasia: Secondary | ICD-10-CM | POA: Diagnosis not present

## 2014-07-06 DIAGNOSIS — G250 Essential tremor: Secondary | ICD-10-CM | POA: Diagnosis present

## 2014-07-06 DIAGNOSIS — R278 Other lack of coordination: Secondary | ICD-10-CM | POA: Diagnosis not present

## 2014-07-06 DIAGNOSIS — E119 Type 2 diabetes mellitus without complications: Secondary | ICD-10-CM | POA: Diagnosis not present

## 2014-07-06 DIAGNOSIS — M1712 Unilateral primary osteoarthritis, left knee: Secondary | ICD-10-CM | POA: Diagnosis present

## 2014-07-06 DIAGNOSIS — Z880 Allergy status to penicillin: Secondary | ICD-10-CM | POA: Diagnosis not present

## 2014-07-06 DIAGNOSIS — I69898 Other sequelae of other cerebrovascular disease: Secondary | ICD-10-CM | POA: Diagnosis not present

## 2014-07-06 DIAGNOSIS — Z9012 Acquired absence of left breast and nipple: Secondary | ICD-10-CM | POA: Diagnosis present

## 2014-07-06 DIAGNOSIS — Z853 Personal history of malignant neoplasm of breast: Secondary | ICD-10-CM | POA: Diagnosis not present

## 2014-07-06 DIAGNOSIS — Z79899 Other long term (current) drug therapy: Secondary | ICD-10-CM | POA: Diagnosis not present

## 2014-07-06 DIAGNOSIS — Z886 Allergy status to analgesic agent status: Secondary | ICD-10-CM | POA: Diagnosis not present

## 2014-07-06 DIAGNOSIS — E11649 Type 2 diabetes mellitus with hypoglycemia without coma: Secondary | ICD-10-CM | POA: Diagnosis present

## 2014-07-06 DIAGNOSIS — N183 Chronic kidney disease, stage 3 (moderate): Secondary | ICD-10-CM | POA: Diagnosis present

## 2014-07-06 DIAGNOSIS — M6281 Muscle weakness (generalized): Secondary | ICD-10-CM | POA: Diagnosis not present

## 2014-07-06 DIAGNOSIS — Z8673 Personal history of transient ischemic attack (TIA), and cerebral infarction without residual deficits: Secondary | ICD-10-CM | POA: Diagnosis not present

## 2014-07-06 LAB — GLUCOSE, CAPILLARY
GLUCOSE-CAPILLARY: 79 mg/dL (ref 65–99)
Glucose-Capillary: 75 mg/dL (ref 65–99)
Glucose-Capillary: 114 mg/dL — ABNORMAL HIGH (ref 65–99)
Glucose-Capillary: 173 mg/dL — ABNORMAL HIGH (ref 65–99)
Glucose-Capillary: 125 mg/dL — ABNORMAL HIGH (ref 65–99)

## 2014-07-06 LAB — RAPID URINE DRUG SCREEN, HOSP PERFORMED
AMPHETAMINES: NOT DETECTED
Barbiturates: NOT DETECTED
Benzodiazepines: POSITIVE — AB
Cocaine: NOT DETECTED
Opiates: NOT DETECTED
Tetrahydrocannabinol: NOT DETECTED

## 2014-07-06 MED ORDER — HYDROCODONE-ACETAMINOPHEN 10-325 MG PO TABS
1.0000 | ORAL_TABLET | Freq: Four times a day (QID) | ORAL | Status: DC | PRN
  Administered 2014-07-06 – 2014-07-08 (×7): 1 via ORAL
  Filled 2014-07-06 (×8): qty 1

## 2014-07-06 MED ORDER — CLOPIDOGREL BISULFATE 75 MG PO TABS
75.0000 mg | ORAL_TABLET | Freq: Every day | ORAL | Status: DC
  Administered 2014-07-06 – 2014-07-09 (×4): 75 mg via ORAL
  Filled 2014-07-06 (×4): qty 1

## 2014-07-06 NOTE — Progress Notes (Signed)
STROKE TEAM PROGRESS NOTE   HISTORY [name] is an 81 y.o. female hx of DM, HTN, prior CVA, breast cancer presenting with slurred speech and confusion. Code stroke activated. LKW at 1600 07/05/2014. Around 2015 daughter found her confused, slurred speech, noted right gaze preference. EMS arrival noted blood sugar of 46, gave glucose with repeat blood sugar of 166. They report gaze preference resolved but she continued to have slurred speech and confusion.   CT head imaging reviewed, shows no acute infarct but presence of chronic left occipital/temporal and bilateral basal ganglia infarcts. Initial NIHSS of 3 for dysarthria, limb ataxia and RLE sensory deficits  Patient was not administered TPA secondary to  outside IV tPA window. She was admitted for further evaluation and treatment.   SUBJECTIVE (INTERVAL HISTORY) No family is at the bedside.  Overall she feels her condition is rapidly improving. She reports yesterday her daughter came to get her because she could not answer the phone. She just finished eating, difficult as she did not have her teeth. States she has some at home and family to bring.   OBJECTIVE Temp:  [97.6 F (36.4 C)-97.9 F (36.6 C)] 97.7 F (36.5 C) (06/17 0448) Pulse Rate:  [57-66] 61 (06/17 0648) Cardiac Rhythm:  [-] Sinus bradycardia (06/17 0448) Resp:  [16-20] 17 (06/17 0648) BP: (107-167)/(51-124) 114/81 mmHg (06/17 0648) SpO2:  [97 %-100 %] 98 % (06/17 0648) Weight:  [80 kg (176 lb 5.9 oz)-80.287 kg (177 lb)] 80.287 kg (177 lb) (06/17 0048)   Recent Labs Lab 07/05/14 2207 07/06/14 0058  GLUCAP 119* 75    Recent Labs Lab 07/05/14 2216 07/05/14 2227  NA 137 138  K 3.9 4.6  CL 104 103  CO2 24  --   GLUCOSE 108* 113*  BUN 25* 39*  CREATININE 1.30* 1.40*  CALCIUM 8.7*  --     Recent Labs Lab 07/05/14 2216  AST 23  ALT 9*  ALKPHOS 58  BILITOT 0.5  PROT 6.0*  ALBUMIN 2.9*    Recent Labs Lab 07/05/14 2216 07/05/14 2227  WBC 9.8   --   NEUTROABS 7.8*  --   HGB 10.4* 14.3  HCT 32.7* 42.0  MCV 76.9*  --   PLT 265  --    No results for input(s): CKTOTAL, CKMB, CKMBINDEX, TROPONINI in the last 168 hours.  Recent Labs  07/05/14 2216  LABPROT 14.5  INR 1.11    Recent Labs  07/05/14 2245  COLORURINE YELLOW  LABSPEC 1.013  PHURINE 5.5  GLUCOSEU NEGATIVE  HGBUR TRACE*  BILIRUBINUR NEGATIVE  KETONESUR NEGATIVE  PROTEINUR NEGATIVE  UROBILINOGEN 0.2  NITRITE NEGATIVE  LEUKOCYTESUR TRACE*       Component Value Date/Time   CHOL 95 04/18/2014 1539   TRIG 66.0 04/18/2014 1539   HDL 35.90* 04/18/2014 1539   CHOLHDL 3 04/18/2014 1539   VLDL 13.2 04/18/2014 1539   LDLCALC 46 04/18/2014 1539   Lab Results  Component Value Date   HGBA1C 5.1 04/18/2014      Component Value Date/Time   LABOPIA NONE DETECTED 07/05/2014 2245   COCAINSCRNUR NONE DETECTED 07/05/2014 2245   LABBENZ POSITIVE* 07/05/2014 2245   AMPHETMU NONE DETECTED 07/05/2014 2245   THCU NONE DETECTED 07/05/2014 2245   LABBARB NONE DETECTED 07/05/2014 2245     Recent Labs Lab 07/05/14 2216  ETH <5    Ct Head Wo Contrast  07/05/2014   CLINICAL DATA:  Code stroke. Right-sided gaze and garbled speech. Initial encounter.  EXAM: CT  HEAD WITHOUT CONTRAST  TECHNIQUE: Contiguous axial images were obtained from the base of the skull through the vertex without intravenous contrast.  COMPARISON:  CT of the head performed 03/20/2010  FINDINGS: There is no evidence of acute infarction, mass lesion, or intra- or extra-axial hemorrhage on CT.  Prominence of the ventricles and sulci reflects moderate cortical volume loss. A large chronic infarct is noted at the left occipital and temporal lobes, with associated encephalomalacia. Mild cerebellar atrophy is noted. Scattered periventricular and subcortical white matter change likely reflects small vessel ischemic microangiopathy. Chronic lacunar infarcts are seen at the basal ganglia bilaterally.  The brainstem  and fourth ventricle are within normal limits. No mass effect or midline shift is seen.  There is no evidence of fracture; visualized osseous structures are unremarkable in appearance. The orbits are within normal limits. The paranasal sinuses and mastoid air cells are well-aerated. No significant soft tissue abnormalities are seen.  IMPRESSION: 1. No acute intracranial pathology seen to explain the patient's symptoms. 2. Moderate cortical volume loss and scattered small vessel ischemic microangiopathy. 3. Large chronic infarct at the left occipital and temporal lobes, with associated encephalomalacia. 4. Chronic lacunar infarcts at the basal ganglia bilaterally. These results were called by telephone at the time of interpretation on 07/05/2014 at 10:08 pm to Dr. Janann Colonel, who verbally acknowledged these results.   Electronically Signed   By: Garald Balding M.D.   On: 07/05/2014 22:08   Mr Brain Wo Contrast  07/06/2014   CLINICAL DATA:  Initial evaluation for slurred speech and confusion.  EXAM: MRI HEAD WITHOUT CONTRAST  TECHNIQUE: Multiplanar, multiecho pulse sequences of the brain and surrounding structures were obtained without intravenous contrast.  COMPARISON:  Prior CT from earlier the same day.  FINDINGS: Diffuse prominence of the CSF containing spaces is compatible with generalized cerebral atrophy. Patchy and confluent T2/FLAIR hyperintensity within the periventricular and deep white matter both cerebral hemispheres most consistent with chronic small vessel ischemic disease, moderate in nature. Extensive encephalomalacia present within the left temporal occipital region, likely related to remote ischemia.  Small remote lacunar infarcts present within the pons and bilateral basal ganglia  There is a punctate 5 mm focus of restricted diffusion within the right temporal lobe, compatible with small acute ischemic infarct (series 3, image 17). No associated hemorrhage or mass effect. No other infarct  identified. Normal intravascular flow voids are preserved. Probable atheromatous disease within the right vertebral artery noted. No acute or chronic intracranial hemorrhage.  No mass lesion or midline shift. No mass effect. Ventricular prominence related of global parenchymal volume loss present without hydrocephalus. No extra-axial fluid collection.  Craniocervical junction within normal limits. Advanced multilevel degenerative disc disease with associated stenoses noted within the visualized upper cervical spine.  Pituitary gland normal.  No acute abnormality about the orbits.  Paranasal sinuses are clear. Scattered opacity present within the mastoid air cells bilaterally, right slightly greater than left. The inner ear structures normal.  Bone marrow signal intensity within normal limits. Scalp soft tissues unremarkable.  IMPRESSION: 1. Punctate 5 mm acute ischemic infarct within the right temporal lobe. No associated hemorrhage or mass effect. 2. No other acute intracranial process. 3. Remote left temporoccipital infarct with additional small remote lacunar infarcts within the bilateral basal ganglia and pons. 4. Advanced cerebral atrophy with moderate chronic microvascular ischemic disease.   Electronically Signed   By: Jeannine Boga M.D.   On: 07/06/2014 00:52     PHYSICAL EXAM  PHYSICAL EXAM Physical exam:  Exam: Gen: NAD, Frail elderly african american lady. Afebrile. Head is nontraumatic. Neck is supple without bruit. Cardiac exam; +SEM, RRR. Lungs are clear to auscultation. Distal pulses are well felt. Eyes: anicteric sclerae, moist conjunctivae                    CV: no MRG, no carotid bruits, no peripheral edema Mental Status: Alert, follows commands, poor historian, oriented to self, location, situation  Neuro: Focused Neurologic Exam  Speech:    No aphasia, mild dysarthria  Cranial Nerves:    The pupils are equal, round, and reactive to light.  Attempted, Fundi not  visualized.  Impaired upgaze otherwise EOMI. No gaze preference, conjugate gaze. Visual fields full, blinks to threat bilaterally. Face symmetric, Tongue midline. Hearing impaired to voice. Shoulder shrug intact. Cough and gag intact.  Motor Observation:    Head tremor    Strength:    Upper extremities are antigravity and equal. Spontaneous movements 4 and purposeful. Right leg is antigravity. Patient reports pain in the left leg and does not want to try to elevate it.     Sensation:  Intact to LT  Plantars downgoing.   Gait: Unable to test     Cortical Sensory Modalities: No visual or sensory neglect.     ASSESSMENT/PLAN Ms. Joeann Huron is a 81 y.o. female with history of DM, HTN, prior CVA, breast cancer presenting with slurred speech and confusion. She did not receive IV t-PA due to delay in arrival.   Stroke:  Non-dominant right temporal lobe infarct secondary to small vessel disease source  Resultant  Neuro deficits improved  MRI  Tiny R temporal lobe infarct  Carotid Doppler  pending   2D Echo  pending   EEG pending   LDL pending   Heparin 5000 units sq tid for VTE prophylaxis  DIET SOFT Room service appropriate?: Yes with Assist; Fluid consistency:: Thin  no antithrombotic prior to admission, now on no antithrombotic . She is allergic to aspirin (hives). Will start plavix 75 for secondary stroke prevention.   Patient counseled to be compliant with her antithrombotic medications  Ongoing aggressive stroke risk factor management  Therapy recommendations:  pending   Disposition:  Pending. Anticipate return home  [facility] for discharge from stroke standpoint once workup completed.  Follow up Dr. Leonie Man, stroke clinic, 2 months. (order written)  Hypertension  Stable  Diabetes  Hypoglycemia on arrival, Glu 46, treated, up to 166  HgbA1c 5.1 03/2014, at goal < 7.0  Other Stroke Risk Factors  Advanced age  Hx stroke/TIA   Other Active  Problems  Progressive L shoulder pain, limited movement "for a while" per pt  Hx L breast cancer s/p mastectomy, on observation after completion of 5 yrs [facility] 06/2013  Anemia of chronic disease  CKD stage 3  OA, uses walker, brace L knee  Hospital day #   [place] for Pager information 07/06/2014 11:51 AM   Personally examined patient and images, and have participated in and made any corrections needed to history, physical, neuro exam,assessment and plan as stated above. Patient presented with slurred speech and confusion. She says she couldn't answer the phone, her daughter found her confused. MRI showed a small right temporal acute ischemic infarct.  Patient's symptoms have resolved. She also has remote left temporal occipital infarcts and bilateral basal ganglia and pons lacunar infarcts. Likely due to small vessel disease. Stroke workup in progress, aggressive vascular risk factor management.  EEG did not show epileptiform discharges or electrographic seizures.   Sarina Ill, MD Stroke Neurology [phone] [facility]      To contact Stroke Continuity provider, please refer to [email or web address] After hours, contact General Neurology

## 2014-07-06 NOTE — Progress Notes (Signed)
Echocardiogram 2D Echocardiogram has been performed.  [name] 07/06/2014, 3:02 PM

## 2014-07-06 NOTE — Procedures (Signed)
ELECTROENCEPHALOGRAM REPORT  Date of Study: 07/06/2014  Patient's Name: [name] MRN: [id number] Date of Birth: 1930-02-12  Referring Provider: Dr. Jennette Kettle  Clinical History: This is an 81 year old woman with confusion, slurred speech, noted right gaze preference.  Medications: acetaminophen (TYLENOL) suppository 650 mg  HYDROcodone-acetaminophen (NORCO) 10-325 MG per tablet 1 tablet indapamide (LOZOL) tablet 1.25 mg insulin aspart (novoLOG) injection 0-9 Units irbesartan (AVAPRO) tablet 75 mg levothyroxine (SYNTHROID, LEVOTHROID) tablet 88 mcg metoprolol succinate (TOPROL-XL) 24 hr tablet 50 mg rOPINIRole (REQUIP) tablet 3 mg  Technical Summary: A multichannel digital EEG recording measured by the international 10-20 system with electrodes applied with paste and impedances below 5000 ohms performed in our laboratory with EKG monitoring in a predominantly drowsy and asleep patient.  Hyperventilation and photic stimulation were not performed.  The digital EEG was referentially recorded, reformatted, and digitally filtered in a variety of bipolar and referential montages for optimal display.    Description: The patient is predominantly drowsy and asleep during the recording.  During brief period of wakefulness, she is noted to be confused. There is a poorly sustained 10 Hz posterior dominant rhythm that attenuates to eye opening. The record is symmetric.  During drowsiness and sleep, there is an increase in theta slowing of the background.  Vertex waves and symmetric sleep spindles were seen.  Hyperventilation and photic stimulation were not performed.  There were no epileptiform discharges or electrographic seizures seen.    EKG lead was unremarkable.  Impression: This predominantly drowsy and asleep EEG is normal.    Clinical Correlation: A normal EEG does not exclude a clinical diagnosis of epilepsy.  If further clinical questions remain, repeat wake EEG may be helpful.   Clinical correlation is advised.   Ellouise Newer, M.D.

## 2014-07-06 NOTE — Progress Notes (Signed)
Patient arrived to floor with two nurses from ED and daughter at bedside. Patient alert and oriented. Patient able to move all extremities, with noted left side weakness from previous stroke according to patient and daughter. The only changes at this time is a faint droop noted at the right side of lips. Patient's blood glucose was rechecked and noted to be 75. Patient given applesauce with pills. Patient and daughter given information regarding plan of care.  Patient given room information and call bell system information. Will continue to monitor accordingly.

## 2014-07-06 NOTE — Progress Notes (Signed)
PT Cancellation Note  Patient Details Name: [name] MRN: [id number] DOB: 26-Feb-1930   Cancelled Treatment:    Reason Eval/Treat Not Completed: Patient declined, no reason specified Patient seemingly confused, poor insight into reason for being hospitalized. Adamantly refusing to work with therapy despite my best efforts. States she cannot walk. Reports she lives alone and will have someone carry her into her house. No answer for how she will mobilize once in her home. Will likely require SNF. Attempt PT evaluation tomorrow.  Ellouise Newer 07/06/2014, 6:34 PM Camille Bal [place], [place]

## 2014-07-06 NOTE — Progress Notes (Signed)
EEG Completed; Results Pending  

## 2014-07-06 NOTE — Progress Notes (Addendum)
Patient unable to void, bladder feels slightly distended. Patient bladder scanned, 550 cc in bladder. MD paged. RN to recheck, will place foley if bladder scan amount greater than 800 cc per MD.   Addendum at 1819: Patient voided.

## 2014-07-06 NOTE — Progress Notes (Signed)
PROGRESS NOTE  [name] VEH:[id number] DOB: 1930-04-04 DOA: 07/05/2014 PCP: Antony Blackbird, MD  Assessment/Plan: CVA- MRI + -echo -carotid -FLP -HgbA1C  CKD- stage 3 - baseline 1.4  HTN - continue home meds Hypothyroidism - continue synthroid DM2 - low dose SSI ac/hs  Code Status: full Family Communication: patient Disposition Plan:    Consultants:  neuro  Procedures:      HPI/Subjective: Going for EEG  Objective: Filed Vitals:   07/06/14 0648  BP: 114/81  Pulse: 61  Temp:   Resp: 17   No intake or output data in the 24 hours ending 07/06/14 0929 Filed Weights   07/05/14 2214 07/06/14 0048  Weight: 80 kg (176 lb 5.9 oz) 80.287 kg (177 lb)    Exam:   General:  Awake, NAD  Cardiovascular: rrr  Respiratory: clear  Abdomen: +Bs, soft  Musculoskeletal:    Data Reviewed: Basic Metabolic Panel:  Recent Labs Lab 07/05/14 2216 07/05/14 2227  NA 137 138  K 3.9 4.6  CL 104 103  CO2 24  --   GLUCOSE 108* 113*  BUN 25* 39*  CREATININE 1.30* 1.40*  CALCIUM 8.7*  --    Liver Function Tests:  Recent Labs Lab 07/05/14 2216  AST 23  ALT 9*  ALKPHOS 58  BILITOT 0.5  PROT 6.0*  ALBUMIN 2.9*   No results for input(s): LIPASE, AMYLASE in the last 168 hours. No results for input(s): AMMONIA in the last 168 hours. CBC:  Recent Labs Lab 07/05/14 2216 07/05/14 2227  WBC 9.8  --   NEUTROABS 7.8*  --   HGB 10.4* 14.3  HCT 32.7* 42.0  MCV 76.9*  --   PLT 265  --    Cardiac Enzymes: No results for input(s): CKTOTAL, CKMB, CKMBINDEX, TROPONINI in the last 168 hours. BNP (last 3 results) No results for input(s): BNP in the last 8760 hours.  ProBNP (last 3 results) No results for input(s): PROBNP in the last 8760 hours.  CBG:  Recent Labs Lab 07/05/14 2207 07/06/14 0058  GLUCAP 119* 75    No results found for this or any previous visit (from the past 240 hour(s)).   Studies: Ct Head Wo Contrast  07/05/2014   CLINICAL  DATA:  Code stroke. Right-sided gaze and garbled speech. Initial encounter.  EXAM: CT HEAD WITHOUT CONTRAST  TECHNIQUE: Contiguous axial images were obtained from the base of the skull through the vertex without intravenous contrast.  COMPARISON:  CT of the head performed 03/20/2010  FINDINGS: There is no evidence of acute infarction, mass lesion, or intra- or extra-axial hemorrhage on CT.  Prominence of the ventricles and sulci reflects moderate cortical volume loss. A large chronic infarct is noted at the left occipital and temporal lobes, with associated encephalomalacia. Mild cerebellar atrophy is noted. Scattered periventricular and subcortical white matter change likely reflects small vessel ischemic microangiopathy. Chronic lacunar infarcts are seen at the basal ganglia bilaterally.  The brainstem and fourth ventricle are within normal limits. No mass effect or midline shift is seen.  There is no evidence of fracture; visualized osseous structures are unremarkable in appearance. The orbits are within normal limits. The paranasal sinuses and mastoid air cells are well-aerated. No significant soft tissue abnormalities are seen.  IMPRESSION: 1. No acute intracranial pathology seen to explain the patient's symptoms. 2. Moderate cortical volume loss and scattered small vessel ischemic microangiopathy. 3. Large chronic infarct at the left occipital and temporal lobes, with associated encephalomalacia. 4. Chronic lacunar infarcts at the  basal ganglia bilaterally. These results were called by telephone at the time of interpretation on 07/05/2014 at 10:08 pm to Dr. Janann Colonel, who verbally acknowledged these results.   Electronically Signed   By: Garald Balding M.D.   On: 07/05/2014 22:08   Mr Brain Wo Contrast  07/06/2014   CLINICAL DATA:  Initial evaluation for slurred speech and confusion.  EXAM: MRI HEAD WITHOUT CONTRAST  TECHNIQUE: Multiplanar, multiecho pulse sequences of the brain and surrounding structures were  obtained without intravenous contrast.  COMPARISON:  Prior CT from earlier the same day.  FINDINGS: Diffuse prominence of the CSF containing spaces is compatible with generalized cerebral atrophy. Patchy and confluent T2/FLAIR hyperintensity within the periventricular and deep white matter both cerebral hemispheres most consistent with chronic small vessel ischemic disease, moderate in nature. Extensive encephalomalacia present within the left temporal occipital region, likely related to remote ischemia.  Small remote lacunar infarcts present within the pons and bilateral basal ganglia  There is a punctate 5 mm focus of restricted diffusion within the right temporal lobe, compatible with small acute ischemic infarct (series 3, image 17). No associated hemorrhage or mass effect. No other infarct identified. Normal intravascular flow voids are preserved. Probable atheromatous disease within the right vertebral artery noted. No acute or chronic intracranial hemorrhage.  No mass lesion or midline shift. No mass effect. Ventricular prominence related of global parenchymal volume loss present without hydrocephalus. No extra-axial fluid collection.  Craniocervical junction within normal limits. Advanced multilevel degenerative disc disease with associated stenoses noted within the visualized upper cervical spine.  Pituitary gland normal.  No acute abnormality about the orbits.  Paranasal sinuses are clear. Scattered opacity present within the mastoid air cells bilaterally, right slightly greater than left. The inner ear structures normal.  Bone marrow signal intensity within normal limits. Scalp soft tissues unremarkable.  IMPRESSION: 1. Punctate 5 mm acute ischemic infarct within the right temporal lobe. No associated hemorrhage or mass effect. 2. No other acute intracranial process. 3. Remote left temporoccipital infarct with additional small remote lacunar infarcts within the bilateral basal ganglia and pons. 4.  Advanced cerebral atrophy with moderate chronic microvascular ischemic disease.   Electronically Signed   By: Jeannine Boga M.D.   On: 07/06/2014 00:52    Scheduled Meds: . heparin  5,000 Units Subcutaneous 3 times per day  . indapamide  1.25 mg Oral q morning - 10a  . insulin aspart  0-9 Units Subcutaneous TID WC  . irbesartan  75 mg Oral Daily  . levothyroxine  88 mcg Oral QAC breakfast  . metoprolol succinate  50 mg Oral Daily  . rOPINIRole  3 mg Oral QHS   Continuous Infusions:  Antibiotics Given (last 72 hours)    None      Principal Problem:   Aphasia Active Problems:   HTN (hypertension)   Hypothyroidism   DM2 (diabetes mellitus, type 2)    Time spent: 25 min    VANN, JESSICA  [facility] Pager [phone]. If 7PM-7AM, please contact night-coverage at [email or web address], password [facility] 07/06/2014, 9:29 AM

## 2014-07-07 ENCOUNTER — Inpatient Hospital Stay (HOSPITAL_COMMUNITY): Payer: Medicare Other

## 2014-07-07 DIAGNOSIS — I10 Essential (primary) hypertension: Secondary | ICD-10-CM

## 2014-07-07 DIAGNOSIS — I6330 Cerebral infarction due to thrombosis of unspecified cerebral artery: Secondary | ICD-10-CM

## 2014-07-07 LAB — HEMOGLOBIN A1C
HEMOGLOBIN A1C: 5.4 % (ref 4.8–5.6)
MEAN PLASMA GLUCOSE: 108 mg/dL

## 2014-07-07 LAB — GLUCOSE, CAPILLARY
GLUCOSE-CAPILLARY: 124 mg/dL — AB (ref 65–99)
GLUCOSE-CAPILLARY: 147 mg/dL — AB (ref 65–99)
Glucose-Capillary: 114 mg/dL — ABNORMAL HIGH (ref 65–99)
Glucose-Capillary: 213 mg/dL — ABNORMAL HIGH (ref 65–99)
Glucose-Capillary: 107 mg/dL — ABNORMAL HIGH (ref 65–99)

## 2014-07-07 LAB — LIPID PANEL
Cholesterol: 110 mg/dL (ref 0–200)
HDL: 37 mg/dL — ABNORMAL LOW (ref >40)
LDL CALC: 55 mg/dL (ref 0–99)
Total CHOL/HDL Ratio: 3 RATIO
Triglycerides: 89 mg/dL (ref <150)
VLDL: 18 mg/dL (ref 0–40)

## 2014-07-07 LAB — VITAMIN B12: Vitamin B-12: 273 pg/mL (ref 180–914)

## 2014-07-07 NOTE — Progress Notes (Signed)
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  Objective: Filed Vitals:   07/07/14 0931  BP: 111/46  Pulse: 71  Temp: 98.8 F (37.1 C)  Resp:    No intake or output data in the 24 hours ending 07/07/14 1106 Filed Weights   07/05/14 2214 07/06/14 0048  Weight: 80 kg (176 lb 5.9 oz) 80.287 kg (177 lb)    Exam:   General:  Awake, NAD  Cardiovascular: rrr  Respiratory: clear  Abdomen: +Bs, soft   Data Reviewed: Basic Metabolic Panel:  Recent Labs Lab 07/05/14 2216 07/05/14 2227  NA 137 138  K 3.9 4.6  CL 104 103  CO2 24  --   GLUCOSE 108* 113*  BUN 25* 39*  CREATININE 1.30* 1.40*  CALCIUM 8.7*  --    Liver Function Tests:  Recent Labs Lab 07/05/14 2216  AST 23  ALT 9*  ALKPHOS 58  BILITOT 0.5  PROT 6.0*  ALBUMIN 2.9*   No results for input(s): LIPASE, AMYLASE in the last 168 hours. No results for input(s): AMMONIA in the last 168 hours. CBC:  Recent Labs Lab 07/05/14 2216 07/05/14 2227  WBC 9.8  --   NEUTROABS 7.8*  --   HGB 10.4* 14.3  HCT 32.7* 42.0  MCV 76.9*  --   PLT 265  --    Cardiac Enzymes: No results for input(s): CKTOTAL, CKMB, CKMBINDEX, TROPONINI in the last 168 hours. BNP (last 3 results) No results for input(s): BNP in the last 8760 hours.  ProBNP (last 3 results) No results for input(s): PROBNP in the last 8760 hours.  CBG:  Recent Labs Lab 07/06/14 0818 07/06/14 1119  07/06/14 1622 07/06/14 2210 07/07/14 0643  GLUCAP 114* 173* 125* 124* 114*    No results found for this or any previous visit (from the past 240 hour(s)).   Studies: Ct Head Wo Contrast  07/05/2014   CLINICAL DATA:  Code stroke. Right-sided gaze and garbled speech. Initial encounter.  EXAM: CT HEAD WITHOUT CONTRAST  TECHNIQUE: Contiguous axial images were obtained from the base of the skull through the vertex without intravenous contrast.  COMPARISON:  CT of the head performed 03/20/2010  FINDINGS: There is no evidence of acute infarction, mass lesion, or intra- or extra-axial hemorrhage on CT.  Prominence of the ventricles and sulci reflects moderate cortical volume loss. A large chronic infarct is noted at the left occipital and temporal lobes, with associated encephalomalacia. Mild cerebellar atrophy is noted. Scattered periventricular and subcortical white matter change likely reflects small vessel ischemic microangiopathy. Chronic lacunar infarcts are seen at the basal ganglia bilaterally.  The brainstem and fourth ventricle are within normal limits. No mass effect or midline shift is seen.  There is no evidence of fracture; visualized osseous structures are unremarkable in appearance. The orbits are within normal limits. The paranasal sinuses and mastoid air cells are well-aerated. No significant soft tissue abnormalities are seen.  IMPRESSION: 1. No acute intracranial pathology seen to  explain the patient's symptoms. 2. Moderate cortical volume loss and scattered small vessel ischemic microangiopathy. 3. Large chronic infarct at the left occipital and temporal lobes, with associated encephalomalacia. 4. Chronic lacunar infarcts at the basal ganglia bilaterally. These results were called by telephone at the time of interpretation on 07/05/2014 at 10:08 pm to Dr. Janann Colonel, who verbally acknowledged these results.   Electronically Signed   By: Garald Balding M.D.   On: 07/05/2014 22:08   Mr Brain Wo  Contrast  07/06/2014   CLINICAL DATA:  Initial evaluation for slurred speech and confusion.  EXAM: MRI HEAD WITHOUT CONTRAST  TECHNIQUE: Multiplanar, multiecho pulse sequences of the brain and surrounding structures were obtained without intravenous contrast.  COMPARISON:  Prior CT from earlier the same day.  FINDINGS: Diffuse prominence of the CSF containing spaces is compatible with generalized cerebral atrophy. Patchy and confluent T2/FLAIR hyperintensity within the periventricular and deep white matter both cerebral hemispheres most consistent with chronic small vessel ischemic disease, moderate in nature. Extensive encephalomalacia present within the left temporal occipital region, likely related to remote ischemia.  Small remote lacunar infarcts present within the pons and bilateral basal ganglia  There is a punctate 5 mm focus of restricted diffusion within the right temporal lobe, compatible with small acute ischemic infarct (series 3, image 17). No associated hemorrhage or mass effect. No other infarct identified. Normal intravascular flow voids are preserved. Probable atheromatous disease within the right vertebral artery noted. No acute or chronic intracranial hemorrhage.  No mass lesion or midline shift. No mass effect. Ventricular prominence related of global parenchymal volume loss present without hydrocephalus. No extra-axial fluid collection.  Craniocervical junction within normal limits. Advanced multilevel degenerative disc disease with associated stenoses noted within the visualized upper cervical spine.  Pituitary gland normal.  No acute abnormality about the orbits.  Paranasal sinuses are clear. Scattered opacity present within the mastoid air cells bilaterally, right slightly greater than left. The inner ear structures normal.  Bone marrow signal intensity within normal limits. Scalp soft tissues unremarkable.  IMPRESSION: 1. Punctate 5 mm acute ischemic infarct within the right temporal lobe.  No associated hemorrhage or mass effect. 2. No other acute intracranial process. 3. Remote left temporoccipital infarct with additional small remote lacunar infarcts within the bilateral basal ganglia and pons. 4. Advanced cerebral atrophy with moderate chronic microvascular ischemic disease.   Electronically Signed   By: Jeannine Boga M.D.   On: 07/06/2014 00:52    Scheduled Meds: . clopidogrel  75 mg Oral Daily  . heparin  5,000 Units Subcutaneous 3 times per day  . indapamide  1.25 mg Oral q morning - 10a  . insulin aspart  0-9 Units Subcutaneous TID WC  . irbesartan  75 mg Oral Daily  . levothyroxine  88 mcg Oral QAC breakfast  . metoprolol succinate  50 mg Oral Daily  . rOPINIRole  3 mg Oral QHS   Continuous Infusions:  Antibiotics Given (last 72 hours)    None      Principal Problem:   Aphasia Active Problems:   HTN (hypertension)   Hypothyroidism   DM2 (diabetes mellitus, type 2)   Cerebral thrombosis with cerebral infarction   CVA (cerebral infarction)    Time spent: 25 min    Anea Fodera  [facility] Pager [phone]. If 7PM-7AM, please contact night-coverage at [email or web address], password [facility] 07/07/2014, 11:06 AM  LOS: 1 day

## 2014-07-07 NOTE — Evaluation (Signed)
Occupational Therapy Evaluation Patient Details Name: [name] MRN: [id number] DOB: [birth year] Today's Date: 07/07/2014    History of Present Illness 81 y.o. female h/o HTN, DM2, prior CVA. Patient presented to the ED with c/o slurred speech and confusion. MRI revealed acute infarct in right temporal lobe.   Clinical Impression   Pt admitted with above. Feel pt will benefit from acute OT to increase independence prior to d/c. Recommending SNF prior to d/c home.     Follow Up Recommendations  SNF;Supervision/Assistance - 24 hour    Equipment Recommendations  Other (comment) (defer to next venue)    Recommendations for Other Services       Precautions / Restrictions Precautions Precautions: Fall Restrictions Weight Bearing Restrictions: No      Mobility Bed Mobility               General bed mobility comments: pt sitting EOB  Transfers Overall transfer level: Needs assistance Equipment used: 2 person hand held assist Transfers: Squat Pivot Transfers     Squat pivot transfers: +2 physical assistance;Max assist (used +3)     General transfer comment: Cues for technique. Pt heavy assist. legs not fully extended when standing from bed.     Balance  Pt sat EOB without physical assist for balance. Heavy assist to transfer to chair.                                           ADL Overall ADL's : Needs assistance/impaired Eating/Feeding: Modified independent;Sitting   Grooming: Wash/dry face;Brushing hair;Set up;Sitting               Lower Body Dressing: Maximal assistance;+2 for physical assistance;Sit to/from stand   Toilet Transfer: +2 for physical assistance;Maximal assistance;Squat-pivot (used +3; from bed to chair)           Functional mobility during ADLs:  (+3 Max Assist squat pivot transfer-may could have performed with +2) General ADL Comments: Pt performed grooming tasks sitting EOB. Pt able to don/doff socks sitting  in chair with increased time.      Vision     Perception     Praxis      Pertinent Vitals/Pain Pain Assessment: Faces Faces Pain Scale: Hurts little more Pain Location: left foot and left shoulder with increased ROM Pain Descriptors / Indicators: Grimacing Pain Intervention(s): Monitored during session;Repositioned     Hand Dominance Right   Extremity/Trunk Assessment Upper Extremity Assessment Upper Extremity Assessment: LUE deficits/detail LUE Deficits / Details: 3-/5 shoulder flexion; weaker grasp compared to right hand; per nurse, pt with some residual deficits on left side from prior CVA. LUE Coordination: decreased gross motor   Lower Extremity Assessment Lower Extremity Assessment: Defer to PT evaluation       Communication Communication Communication: No difficulties   Cognition Arousal/Alertness: Awake/alert Behavior During Therapy: WFL for tasks assessed/performed Overall Cognitive Status: No family/caregiver present to determine baseline cognitive functioning (not oriented; did not know her birthday)                     General Comments       Exercises       Shoulder Instructions      Home Living Family/patient expects to be discharged to:: Unsure Living Arrangements: Alone  Additional Comments: family not present in session. Pt unreliable historian      Prior Functioning/Environment          Comments: unsure     OT Diagnosis: Hemiplegia non-dominant side;Acute pain;Altered mental status   OT Problem List: Decreased strength;Decreased range of motion;Pain;Impaired UE functional use;Decreased coordination;Decreased cognition;Decreased knowledge of use of DME or AE;Decreased knowledge of precautions;Impaired balance (sitting and/or standing);Decreased activity tolerance   OT Treatment/Interventions: Self-care/ADL training;Therapeutic exercise;Neuromuscular education;DME and/or AE  instruction;Therapeutic activities;Cognitive remediation/compensation;Patient/family education;Balance training    OT Goals(Current goals can be found in the care plan section) Acute Rehab OT Goals Patient Stated Goal: not stated OT Goal Formulation: Patient unable to participate in goal setting Time For Goal Achievement: 07/21/14 Potential to Achieve Goals: Good ADL Goals Pt Will Perform Lower Body Bathing: bed level;sitting/lateral leans;with min assist Pt Will Perform Upper Body Dressing: with min assist;sitting Pt Will Perform Lower Body Dressing: with min assist;bed level;sitting/lateral leans Pt Will Transfer to Toilet: with mod assist;stand pivot transfer;bedside commode  OT Frequency: Min 2X/week   Barriers to D/C:            Co-evaluation              End of Session Equipment Utilized During Treatment: Gait belt Nurse Communication: Other (comment) (assisted with transfer/ discharge recommendation)  Activity Tolerance: Patient tolerated treatment well Patient left: in chair;with call bell/phone within reach;with chair alarm set   Time: 5797-2820 OT Time Calculation (min): 18 min Charges:  OT General Charges $OT Visit: 1 Procedure OT Evaluation $Initial OT Evaluation Tier I: 1 Procedure G-CodesBenito Mccreedy OTR/L [id number] 07/07/2014, 10:43 AM

## 2014-07-07 NOTE — Evaluation (Signed)
Physical Therapy Evaluation Patient Details Name: [name] MRN: [id number] DOB: 1930-12-29 Today's Date: 07/07/2014   History of Present Illness  81 y.o. female h/o HTN, DM2, prior CVA. Patient presented to the ED with c/o slurred speech and confusion. MRI revealed acute infarct in right temporal lobe.  Clinical Impression  Patient presents with problems listed below.  Will benefit from acute PT to maximize functional mobility prior to discharge.  Patient requiring +2 max assist for mobility and with decreased cognition.  Recommend SNF at discharge for continued therapy.    Follow Up Recommendations SNF;Supervision/Assistance - 24 hour    Equipment Recommendations  None recommended by PT    Recommendations for Other Services       Precautions / Restrictions Precautions Precautions: Fall Restrictions Weight Bearing Restrictions: No      Mobility  Bed Mobility Overal bed mobility: Needs Assistance;+2 for physical assistance Bed Mobility: Sit to Supine       Sit to supine: Total assist;+2 for physical assistance   General bed mobility comments: Patient required mod assist to scoot back onto bed while sitting.  Required +2 total assist to return to supine, and to scoot to [facility].  Transfers Overall transfer level: Needs assistance Equipment used: Rolling walker (2 wheeled) Transfers: Sit to/from [facility] Sit to Stand: Max assist;+2 physical assistance Stand pivot transfers: Max assist;+2 physical assistance       General transfer comment: Patient asked for Lt knee brace - mod assist to don.  Verbal cues for hand placement.  Required +2 max assist to power up to standing - unable to fully extend hips in stance.  Max assist to maneuver RW to pivot to chair.  Patient required +2 max assist to maintain balance and upright position during transfer.  Ambulation/Gait                Stairs            Wheelchair Mobility    Modified Rankin  (Stroke Patients Only)       Balance Overall balance assessment: Needs assistance Sitting-balance support: Single extremity supported;Feet supported Sitting balance-Leahy Scale: Fair     Standing balance support: Bilateral upper extremity supported Standing balance-Leahy Scale: Zero                               Pertinent Vitals/Pain Pain Assessment: Faces Faces Pain Scale: Hurts even more Pain Location: Lt knee and lower leg/foot Pain Descriptors / Indicators: Grimacing;Moaning (with weight bearing) Pain Intervention(s): Limited activity within patient's tolerance;Repositioned;Patient requesting pain meds-RN notified    Home Living Family/patient expects to be discharged to:: Skilled nursing facility Living Arrangements: Alone                    Prior Function           Comments: Patient states "lady come to help me with house, laundry, and bath". Unsure of reliability of information     Hand Dominance   Dominant Hand: Right    Extremity/Trunk Assessment   Upper Extremity Assessment: Defer to OT evaluation           Lower Extremity Assessment: Generalized weakness;RLE deficits/detail;LLE deficits/detail RLE Deficits / Details: Strength grossly 3+/5 to 4-/5 LLE Deficits / Details: Strength grossly 3+/5.  Decreased knee flexion - approx 80* (due to pain)  Cervical / Trunk Assessment: Kyphotic  Communication   Communication: No difficulties  Cognition Arousal/Alertness: Awake/alert Behavior  During Therapy: Agitated;Anxious Overall Cognitive Status: No family/caregiver present to determine baseline cognitive functioning (Patient in bed, stating she doesn't want to fall out of chai)       Memory: Decreased short-term memory              General Comments      Exercises        Assessment/Plan    PT Assessment Patient needs continued PT services  PT Diagnosis Difficulty walking;Generalized weakness;Acute pain;Altered mental  status   PT Problem List Decreased strength;Decreased range of motion;Decreased activity tolerance;Decreased balance;Decreased mobility;Decreased cognition;Decreased knowledge of use of DME;Obesity;Pain  PT Treatment Interventions DME instruction;Gait training;Functional mobility training;Therapeutic activities;Therapeutic exercise;Balance training;Cognitive remediation;Patient/family education   PT Goals (Current goals can be found in the Care Plan section) Acute Rehab PT Goals Patient Stated Goal: not stated PT Goal Formulation: Patient unable to participate in goal setting Time For Goal Achievement: 07/21/14 Potential to Achieve Goals: Fair    Frequency Min 2X/week   Barriers to discharge Decreased caregiver support Per chart, patient lives alone    Co-evaluation               End of Session Equipment Utilized During Treatment: Gait belt (Lt knee brace per patient request) Activity Tolerance: Patient limited by pain;Patient limited by fatigue Patient left: in chair;with call bell/phone within reach;with chair alarm set Nurse Communication: Mobility status         Time: 10:36-10:49 and 7588-3254 PT Time Calculation (min) (ACUTE ONLY): 13 min + 11 min = 24 min total   Charges:   PT Evaluation $Initial PT Evaluation Tier I: 1 Procedure PT Treatments $Therapeutic Activity: 8-22 mins   PT G Codes:        Despina Pole 2014/08/05, 3:41 PM Carita Pian. Sanjuana Kava, [place] Pager [phone]

## 2014-07-07 NOTE — Progress Notes (Addendum)
STROKE TEAM PROGRESS NOTE   HISTORY [name] is an 81 y.o. female hx of DM, HTN, prior CVA, breast cancer presenting with slurred speech and confusion. Code stroke activated. LKW at 1600 07/05/2014. Around 2015 daughter found her confused, slurred speech, noted right gaze preference. EMS arrival noted blood sugar of 46, gave glucose with repeat blood sugar of 166. They report gaze preference resolved but she continued to have slurred speech and confusion.   CT head imaging reviewed, shows no acute infarct but presence of chronic left occipital/temporal and bilateral basal ganglia infarcts. Initial NIHSS of 3 for dysarthria, limb ataxia and RLE sensory deficits  Patient was not administered TPA secondary to  outside IV tPA window. She was admitted for further evaluation and treatment.   SUBJECTIVE (INTERVAL HISTORY) No family is at the bedside.  Overall she feels her condition is rapidly improving. She reports yesterday her daughter came to get her because she could not answer the phone. She just finished eating, difficult as she did not have her teeth. States she has some at home and family to bring. Dr Merlene Laughter and Dr Eliseo Squires both discussed need for PT/OT and placement but she somewhat reluctant for now.    OBJECTIVE Temp:  [97.6 F (36.4 C)-99.3 F (37.4 C)] 99.3 F (37.4 C) (06/18 0548) Pulse Rate:  [53-68] 62 (06/18 0548) Cardiac Rhythm:  [-] Sinus tachycardia (06/18 0000) Resp:  [15-18] 18 (06/18 0548) BP: (106-149)/(43-72) 116/50 mmHg (06/18 0548) SpO2:  [97 %-100 %] 97 % (06/18 0548)   Recent Labs Lab 07/06/14 0818 07/06/14 1119 07/06/14 1622 07/06/14 2210 07/07/14 0643  GLUCAP 114* 173* 125* 124* 114*    Recent Labs Lab 07/05/14 2216 07/05/14 2227  NA 137 138  K 3.9 4.6  CL 104 103  CO2 24  --   GLUCOSE 108* 113*  BUN 25* 39*  CREATININE 1.30* 1.40*  CALCIUM 8.7*  --     Recent Labs Lab 07/05/14 2216  AST 23  ALT 9*  ALKPHOS 58  BILITOT 0.5  PROT  6.0*  ALBUMIN 2.9*    Recent Labs Lab 07/05/14 2216 07/05/14 2227  WBC 9.8  --   NEUTROABS 7.8*  --   HGB 10.4* 14.3  HCT 32.7* 42.0  MCV 76.9*  --   PLT 265  --    No results for input(s): CKTOTAL, CKMB, CKMBINDEX, TROPONINI in the last 168 hours.  Recent Labs  07/05/14 2216  LABPROT 14.5  INR 1.11    Recent Labs  07/05/14 2245  COLORURINE YELLOW  LABSPEC 1.013  PHURINE 5.5  GLUCOSEU NEGATIVE  HGBUR TRACE*  BILIRUBINUR NEGATIVE  KETONESUR NEGATIVE  PROTEINUR NEGATIVE  UROBILINOGEN 0.2  NITRITE NEGATIVE  LEUKOCYTESUR TRACE*       Component Value Date/Time   CHOL 110 07/07/2014 0500   TRIG 89 07/07/2014 0500   HDL 37* 07/07/2014 0500   CHOLHDL 3.0 07/07/2014 0500   VLDL 18 07/07/2014 0500   LDLCALC 55 07/07/2014 0500   Lab Results  Component Value Date   HGBA1C 5.4 07/05/2014      Component Value Date/Time   LABOPIA NONE DETECTED 07/05/2014 2245   COCAINSCRNUR NONE DETECTED 07/05/2014 2245   LABBENZ POSITIVE* 07/05/2014 2245   AMPHETMU NONE DETECTED 07/05/2014 2245   THCU NONE DETECTED 07/05/2014 2245   LABBARB NONE DETECTED 07/05/2014 2245     Recent Labs Lab 07/05/14 2216  ETH <5    EEG 07/06/2014 Impression: This predominantly drowsy and asleep EEG is normal.  Clinical Correlation: A normal EEG does not exclude a clinical diagnosis of epilepsy. If further clinical questions remain, repeat wake EEG may be helpful. Clinical correlation is advised.    Ct Head Wo Contrast 07/05/2014    1. No acute intracranial pathology seen to explain the patient's symptoms.  2. Moderate cortical volume loss and scattered small vessel ischemic microangiopathy.  3. Large chronic infarct at the left occipital and temporal lobes, with associated encephalomalacia.  4. Chronic lacunar infarcts at the basal ganglia bilaterally.        Mr Brain Wo Contrast 07/06/2014    1. Punctate 5 mm acute ischemic infarct within the right temporal lobe. No  associated hemorrhage or mass effect.  2. No other acute intracranial process.  3. Remote left temporoccipital infarct with additional small remote lacunar infarcts within the bilateral basal ganglia and pons.  4. Advanced cerebral atrophy with moderate chronic microvascular ischemic disease.        Physical Exam:   Exam: Gen: NAD, Frail elderly african american lady. Afebrile. Head is nontraumatic. Neck is supple without bruit. Cardiac exam; +SEM, RRR. Lungs are clear to auscultation. Distal pulses are well felt. Eyes: anicteric sclerae, moist conjunctivae                    CV: no MRG, no carotid bruits, no peripheral edema Mental Status: Alert, follows commands, poor historian, oriented to self, location, situation  Neuro: Focused Neurologic Exam  Speech:    No aphasia, mild dysarthria  Cranial Nerves:    The pupils are equal, round, and reactive to light.  Attempted, Fundi not visualized.  Impaired upgaze otherwise EOMI. No gaze preference, conjugate gaze. Visual fields full, blinks to threat bilaterally. Face symmetric, Tongue midline. Hearing impaired to voice. Shoulder shrug intact. Cough and gag intact.  Motor Observation:    Head tremor - persistent -- ; also hand tremor L> R; long hx per pt     Strength:    Upper extremities are antigravity and equal. Spontaneous movements and purposeful - arms. Right leg is 2/5 and L 1/5. Patient reports pain in the left leg and does not want to try to elevate it. (C/O marked R should and R knee pain)     Sensation:  Intact to LT and pain  Plantars downgoing.   Gait: Unable to test     Cortical Sensory Modalities: No visual or sensory neglect.     ASSESSMENT/PLAN Ms. [name] is a 81 y.o. female with history of DM, HTN, prior CVA, breast cancer presenting with slurred speech and confusion. She did not receive IV t-PA due to delay in arrival.   Stroke:  Non-dominant right temporal lobe infarct secondary to small vessel  disease source  Resultant  Neuro deficits improved  MRI  Tiny R temporal lobe infarct  Carotid Doppler - pending   2D Echo  EF 55-60%. Cardiac source of emboli identified.  EEG - normal (see report above)  LDL 55  Heparin 5000 units sq tid for VTE prophylaxis DIET SOFT Room service appropriate?: Yes with Assist; Fluid consistency:: Thin  no antithrombotic prior to admission, now on no antithrombotic . She is allergic to aspirin (hives). Will start plavix 75 for secondary stroke prevention.   Patient counseled to be compliant with her antithrombotic medications  Ongoing aggressive stroke risk factor management  Therapy recommendations:  pending   Disposition:  Pending. Anticipate return home  [facility] for discharge from stroke standpoint once workup completed.  Follow up  Dr. Leonie Man, stroke clinic, 2 months. (order written)  Hypertension  Stable  Diabetes  Hypoglycemia on arrival, Glu 46, treated, up to 166  HgbA1c 5.1 03/2014, at goal < 7.0  Other Stroke Risk Factors  Advanced age  Hx stroke/TIA   Other Active Problems  Progressive L shoulder pain, limited movement "for a while" per pt  Hx L breast cancer s/p mastectomy, on observation after completion of 5 yrs [facility] 06/2013  Anemia of chronic disease  CKD stage 3   OA, uses walker, brace L knee   ESSENTIAL TREMOR  Suspect mild cognitive impairment-- check V12 and homocysteine   Hospital day # 1    Dr Merlene Laughter  personally examined patient and have participated in and made any corrections needed to history, physical, neuro exam,assessment and plan as stated above. Patient presented with slurred speech and confusion. She says she couldn't answer the phone, her daughter found her confused. MRI showed a small right temporal acute ischemic infarct. She also has remote left temporal occipital infarcts and bilateral basal ganglia and pons lacunar infarcts. Likely due to small vessel disease. Stroke workup in  progress, aggressive vascular risk factor management. EEG did not show epileptiform discharges or electrographic seizures.        To contact Stroke Continuity provider, please refer to [email or web address] After hours, contact General Neurology

## 2014-07-08 ENCOUNTER — Inpatient Hospital Stay (HOSPITAL_COMMUNITY): Payer: Medicare Other

## 2014-07-08 DIAGNOSIS — I69898 Other sequelae of other cerebrovascular disease: Secondary | ICD-10-CM

## 2014-07-08 DIAGNOSIS — R531 Weakness: Secondary | ICD-10-CM

## 2014-07-08 LAB — GLUCOSE, CAPILLARY
GLUCOSE-CAPILLARY: 176 mg/dL — AB (ref 65–99)
GLUCOSE-CAPILLARY: 160 mg/dL — AB (ref 65–99)
Glucose-Capillary: 139 mg/dL — ABNORMAL HIGH (ref 65–99)
Glucose-Capillary: 123 mg/dL — ABNORMAL HIGH (ref 65–99)

## 2014-07-08 LAB — COMPREHENSIVE METABOLIC PANEL
ALBUMIN: 2.7 g/dL — AB (ref 3.5–5.0)
ALK PHOS: 58 U/L (ref 38–126)
ALT: 9 U/L — AB (ref 14–54)
ANION GAP: 8 (ref 5–15)
AST: 18 U/L (ref 15–41)
BUN: 23 mg/dL — AB (ref 6–20)
CO2: 27 mmol/L (ref 22–32)
Calcium: 8.6 mg/dL — ABNORMAL LOW (ref 8.9–10.3)
Chloride: 102 mmol/L (ref 101–111)
Creatinine, Ser: 1.52 mg/dL — ABNORMAL HIGH (ref 0.44–1.00)
GFR calc Af Amer: 36 mL/min — ABNORMAL LOW (ref >60)
GFR calc non Af Amer: 31 mL/min — ABNORMAL LOW (ref >60)
GLUCOSE: 128 mg/dL — AB (ref 65–99)
POTASSIUM: 3.8 mmol/L (ref 3.5–5.1)
SODIUM: 137 mmol/L (ref 135–145)
Total Bilirubin: 0.8 mg/dL (ref 0.3–1.2)
Total Protein: 6.6 g/dL (ref 6.5–8.1)

## 2014-07-08 MED ORDER — CYANOCOBALAMIN 1000 MCG/ML IJ SOLN
1000.0000 ug | Freq: Once | INTRAMUSCULAR | Status: AC
  Administered 2014-07-08: 1000 ug via INTRAMUSCULAR
  Filled 2014-07-08: qty 1

## 2014-07-08 MED ORDER — VITAMIN B-12 100 MCG PO TABS: 500.0000 ug | ORAL_TABLET | Freq: Every day | ORAL | Status: DC

## 2014-07-08 MED ORDER — VITAMIN B-12 1000 MCG PO TABS
1000.0000 ug | ORAL_TABLET | Freq: Every day | ORAL | Status: DC
  Administered 2014-07-08 – 2014-07-09 (×2): 1000 ug via ORAL
  Filled 2014-07-08 (×2): qty 1

## 2014-07-08 NOTE — Progress Notes (Signed)
  PROGRESS NOTE  [name] DHR:[id number] DOB: September 09, 1930 DOA: 07/05/2014 PCP: Antony Blackbird, MD  Assessment/Plan: CVA- MRI + -echo:grade 1 diastolic dysfunction  -carotid still pending -FLP: LDL 55 -HgbA1C: 5.4  CKD- stage 3 - baseline 1.4  HTN - continue home meds Hypothyroidism - continue synthroid DM2 - low dose SSI ac/hs Low-normal B12- replace  Code Status: full Family Communication: patient- called daughter, VM full Disposition Plan: SNF   Consultants:  neuro  Procedures:      HPI/Subjective: More confused this AM- did not know year/place  Objective: Filed Vitals:   07/08/14 0553  BP: 120/44  Pulse: 71  Temp: 97.9 F (36.6 C)  Resp: 16   No intake or output data in the 24 hours ending 07/08/14 0756 Filed Weights   07/05/14 2214 07/06/14 0048  Weight: 80 kg (176 lb 5.9 oz) 80.287 kg (177 lb)    Exam:   General:  Awake, NAD  Cardiovascular: rrr  Respiratory: clear  Abdomen: +Bs, soft   Data Reviewed: Basic Metabolic Panel:  Recent Labs Lab 07/05/14 2216 07/05/14 2227  NA 137 138  K 3.9 4.6  CL 104 103  CO2 24  --   GLUCOSE 108* 113*  BUN 25* 39*  CREATININE 1.30* 1.40*  CALCIUM 8.7*  --    Liver Function Tests:  Recent Labs Lab 07/05/14 2216  AST 23  ALT 9*  ALKPHOS 58  BILITOT 0.5  PROT 6.0*  ALBUMIN 2.9*   No results for input(s): LIPASE, AMYLASE in the last 168 hours. No results for input(s): AMMONIA in the last 168 hours. CBC:  Recent Labs Lab 07/05/14 2216 07/05/14 2227  WBC 9.8  --   NEUTROABS 7.8*  --   HGB 10.4* 14.3  HCT 32.7* 42.0  MCV 76.9*  --   PLT 265  --    Cardiac Enzymes: No results for input(s): CKTOTAL, CKMB, CKMBINDEX, TROPONINI in the last 168 hours. BNP (last 3 results) No results for input(s): BNP in the last 8760 hours.  ProBNP (last 3 results) No results for input(s): PROBNP in the last 8760 hours.  CBG:  Recent Labs Lab 07/07/14 0643 07/07/14 1130 07/07/14 1627  07/07/14 2157 07/08/14 0551  GLUCAP 114* 213* 147* 107* 139*    No results found for this or any previous visit (from the past 240 hour(s)).   Studies: No results found.  Scheduled Meds: . clopidogrel  75 mg Oral Daily  . heparin  5,000 Units Subcutaneous 3 times per day  . indapamide  1.25 mg Oral q morning - 10a  . insulin aspart  0-9 Units Subcutaneous TID WC  . irbesartan  75 mg Oral Daily  . levothyroxine  88 mcg Oral QAC breakfast  . metoprolol succinate  50 mg Oral Daily  . rOPINIRole  3 mg Oral QHS  . vitamin B-12  1,000 mcg Oral Daily   Continuous Infusions:  Antibiotics Given (last 72 hours)    None      Principal Problem:   Aphasia Active Problems:   HTN (hypertension)   Hypothyroidism   DM2 (diabetes mellitus, type 2)   Cerebral thrombosis with cerebral infarction   CVA (cerebral infarction)    Time spent: 25 min    [name]  [facility] Pager [phone]. If 7PM-7AM, please contact night-coverage at [email or web address], password [facility] 07/08/2014, 7:56 AM  LOS: 2 days

## 2014-07-08 NOTE — Progress Notes (Signed)
VASCULAR LAB PRELIMINARY  PRELIMINARY  PRELIMINARY  PRELIMINARY  Carotid Doppler completed.    Preliminary report:  1-39% ICA stenosis.  Vertebral artery flow is antegrade.   Kambry Takacs, RVT 07/08/2014, 3:38 PM

## 2014-07-08 NOTE — Progress Notes (Signed)
STROKE TEAM PROGRESS NOTE   HISTORY [name] is an 81 y.o. female hx of DM, HTN, prior CVA, breast cancer presenting with slurred speech and confusion. Code stroke activated. LKW at 1600 07/05/2014. Around 2015 daughter found her confused, slurred speech, noted right gaze preference. EMS arrival noted blood sugar of 46, gave glucose with repeat blood sugar of 166. They report gaze preference resolved but she continued to have slurred speech and confusion.   CT head imaging reviewed, shows no acute infarct but presence of chronic left occipital/temporal and bilateral basal ganglia infarcts. Initial NIHSS of 3 for dysarthria, limb ataxia and RLE sensory deficits  Patient was not administered TPA secondary to  outside IV tPA window. She was admitted for further evaluation and treatment.   SUBJECTIVE (INTERVAL HISTORY) No family is at the bedside.  Overall she feels her condition is rapidly improving. She reports yesterday her daughter came to get her because she could not answer the phone. She just finished eating, difficult as she did not have her teeth. States she has some at home and family to bring. Dr Merlene Laughter and Dr Eliseo Squires both discussed need for PT/OT and placement but she somewhat reluctant for now.    OBJECTIVE Temp:  [97.9 F (36.6 C)-98.8 F (37.1 C)] 97.9 F (36.6 C) (06/19 0553) Pulse Rate:  [67-89] 71 (06/19 0553) Cardiac Rhythm:  [-] Normal sinus rhythm (06/19 0833) Resp:  [16-18] 16 (06/19 0553) BP: (85-139)/(44-69) 120/44 mmHg (06/19 0553) SpO2:  [94 %-99 %] 97 % (06/19 0553)   Recent Labs Lab 07/07/14 0643 07/07/14 1130 07/07/14 1627 07/07/14 2157 07/08/14 0551  GLUCAP 114* 213* 147* 107* 139*    Recent Labs Lab 07/05/14 2216 07/05/14 2227  NA 137 138  K 3.9 4.6  CL 104 103  CO2 24  --   GLUCOSE 108* 113*  BUN 25* 39*  CREATININE 1.30* 1.40*  CALCIUM 8.7*  --     Recent Labs Lab 07/05/14 2216  AST 23  ALT 9*  ALKPHOS 58  BILITOT 0.5  PROT  6.0*  ALBUMIN 2.9*    Recent Labs Lab 07/05/14 2216 07/05/14 2227  WBC 9.8  --   NEUTROABS 7.8*  --   HGB 10.4* 14.3  HCT 32.7* 42.0  MCV 76.9*  --   PLT 265  --    No results for input(s): CKTOTAL, CKMB, CKMBINDEX, TROPONINI in the last 168 hours.  Recent Labs  07/05/14 2216  LABPROT 14.5  INR 1.11    Recent Labs  07/05/14 2245  COLORURINE YELLOW  LABSPEC 1.013  PHURINE 5.5  GLUCOSEU NEGATIVE  HGBUR TRACE*  BILIRUBINUR NEGATIVE  KETONESUR NEGATIVE  PROTEINUR NEGATIVE  UROBILINOGEN 0.2  NITRITE NEGATIVE  LEUKOCYTESUR TRACE*       Component Value Date/Time   CHOL 110 07/07/2014 0500   TRIG 89 07/07/2014 0500   HDL 37* 07/07/2014 0500   CHOLHDL 3.0 07/07/2014 0500   VLDL 18 07/07/2014 0500   LDLCALC 55 07/07/2014 0500   Lab Results  Component Value Date   HGBA1C 5.4 07/05/2014      Component Value Date/Time   LABOPIA NONE DETECTED 07/05/2014 2245   COCAINSCRNUR NONE DETECTED 07/05/2014 2245   LABBENZ POSITIVE* 07/05/2014 2245   AMPHETMU NONE DETECTED 07/05/2014 2245   THCU NONE DETECTED 07/05/2014 2245   LABBARB NONE DETECTED 07/05/2014 2245     VITB12 274.     Recent Labs Lab 07/05/14 2216  ETH <5    EEG 07/06/2014 Impression: This predominantly  drowsy and asleep EEG is normal.  Clinical Correlation: A normal EEG does not exclude a clinical diagnosis of epilepsy. If further clinical questions remain, repeat wake EEG may be helpful. Clinical correlation is advised.    Ct Head Wo Contrast 07/05/2014    1. No acute intracranial pathology seen to explain the patient's symptoms.  2. Moderate cortical volume loss and scattered small vessel ischemic microangiopathy.  3. Large chronic infarct at the left occipital and temporal lobes, with associated encephalomalacia.  4. Chronic lacunar infarcts at the basal ganglia bilaterally.        Mr Brain Wo Contrast 07/06/2014    1. Punctate 5 mm acute ischemic infarct within the right  temporal lobe. No associated hemorrhage or mass effect.  2. No other acute intracranial process.  3. Remote left temporoccipital infarct with additional small remote lacunar infarcts within the bilateral basal ganglia and pons.  4. Advanced cerebral atrophy with moderate chronic microvascular ischemic disease.        Physical Exam:   Exam: Gen: NAD, Frail elderly african american lady. Afebrile. Head is nontraumatic. Neck is supple without bruit. Cardiac exam; +SEM, RRR. Lungs are clear to auscultation. Distal pulses are well felt. Eyes: anicteric sclerae, moist conjunctivae                    CV: no MRG, no carotid bruits, no peripheral edema Mental Status: Alert, follows commands, poor historian, oriented to self, location, situation  Neuro: Focused Neurologic Exam  Speech:    No aphasia, mild dysarthria  Cranial Nerves:    The pupils are equal, round, and reactive to light.  Attempted, Fundi not visualized.  Impaired upgaze otherwise EOMI. No gaze preference, conjugate gaze. Visual fields full, blinks to threat bilaterally. Face symmetric, Tongue midline. Hearing impaired to voice. Shoulder shrug intact. Cough and gag intact.  Motor Observation:    Head tremor - persistent -- ; also hand tremor L> R; long hx per pt     Strength:    Upper extremities are antigravity and equal. Spontaneous movements and purposeful - arms. Right leg is 2/5 and L 1/5. Patient reports pain in the left leg and does not want to try to elevate it. (C/O marked R should and R knee pain)     Sensation:  Intact to LT and pain  Plantars downgoing.   Gait: Unable to test     Cortical Sensory Modalities: No visual or sensory neglect.     ASSESSMENT/PLAN Ms. [name] is a 81 y.o. female with history of DM, HTN, prior CVA, breast cancer presenting with slurred speech and confusion. She did not receive IV t-PA due to delay in arrival.   Stroke:  Non-dominant right temporal lobe infarct  secondary to small vessel disease source  Resultant  Neuro deficits improved  MRI  Tiny R temporal lobe infarct  Carotid Doppler - pending   2D Echo  EF 55-60%. Cardiac source of emboli identified.  EEG - normal (see report above)  LDL 55  Heparin 5000 units sq tid for VTE prophylaxis DIET SOFT Room service appropriate?: Yes with Assist; Fluid consistency:: Thin  no antithrombotic prior to admission, now on no antithrombotic . She is allergic to aspirin (hives). Will start plavix 75 for secondary stroke prevention.   Patient counseled to be compliant with her antithrombotic medications  Ongoing aggressive stroke risk factor management  Therapy recommendations:  pending   Disposition:  Pending. Anticipate return home - Therapists recommend SNF.  Ok  for discharge from stroke standpoint once workup completed.  Follow up Dr. Leonie Man, stroke clinic, 2 months. (order written)  Hypertension  Stable  Diabetes  Hypoglycemia on arrival, Glu 46, treated, up to 166  HgbA1c 5.1 03/2014, at goal < 7.0  Other Stroke Risk Factors  Advanced age  Hx stroke/TIA   Other Active Problems  Progressive L shoulder pain, limited movement "for a while" per pt  Hx L breast cancer s/p mastectomy, on observation after completion of 5 yrs [facility] 06/2013  Anemia of chronic disease  CKD stage 3   OA, uses walker, brace L knee   ESSENTIAL TREMOR  Suspect mild cognitive impairment/ mild vascular dementia -- check V12 -- relatively low will replace; and homocysteine -Pending   Recheck renal function   Hospital day # 2    Dr Merlene Laughter  personally examined patient and have participated in and made any corrections needed to history, physical, neuro exam,assessment and plan as stated above. Patient presented with slurred speech and confusion. She says she couldn't answer the phone, her daughter found her confused. MRI showed a small right temporal acute ischemic infarct. She also has remote  left temporal occipital infarcts and bilateral basal ganglia and pons lacunar infarcts. Likely due to small vessel disease. Stroke workup in progress, aggressive vascular risk factor management. EEG did not show epileptiform discharges or electrographic seizures.        To contact Stroke Continuity provider, please refer to [email or web address] After hours, contact General Neurology

## 2014-07-09 DIAGNOSIS — Z79891 Long term (current) use of opiate analgesic: Secondary | ICD-10-CM | POA: Diagnosis not present

## 2014-07-09 DIAGNOSIS — Z90710 Acquired absence of both cervix and uterus: Secondary | ICD-10-CM | POA: Diagnosis not present

## 2014-07-09 DIAGNOSIS — Z794 Long term (current) use of insulin: Secondary | ICD-10-CM | POA: Diagnosis not present

## 2014-07-09 DIAGNOSIS — I129 Hypertensive chronic kidney disease with stage 1 through stage 4 chronic kidney disease, or unspecified chronic kidney disease: Secondary | ICD-10-CM | POA: Diagnosis present

## 2014-07-09 DIAGNOSIS — G250 Essential tremor: Secondary | ICD-10-CM | POA: Diagnosis not present

## 2014-07-09 DIAGNOSIS — I6789 Other cerebrovascular disease: Secondary | ICD-10-CM | POA: Diagnosis not present

## 2014-07-09 DIAGNOSIS — M6281 Muscle weakness (generalized): Secondary | ICD-10-CM | POA: Diagnosis not present

## 2014-07-09 DIAGNOSIS — E1165 Type 2 diabetes mellitus with hyperglycemia: Secondary | ICD-10-CM | POA: Diagnosis not present

## 2014-07-09 DIAGNOSIS — Z9012 Acquired absence of left breast and nipple: Secondary | ICD-10-CM | POA: Diagnosis present

## 2014-07-09 DIAGNOSIS — F419 Anxiety disorder, unspecified: Secondary | ICD-10-CM | POA: Diagnosis not present

## 2014-07-09 DIAGNOSIS — R7989 Other specified abnormal findings of blood chemistry: Secondary | ICD-10-CM | POA: Diagnosis not present

## 2014-07-09 DIAGNOSIS — D631 Anemia in chronic kidney disease: Secondary | ICD-10-CM | POA: Diagnosis not present

## 2014-07-09 DIAGNOSIS — I6930 Unspecified sequelae of cerebral infarction: Secondary | ICD-10-CM | POA: Diagnosis not present

## 2014-07-09 DIAGNOSIS — E038 Other specified hypothyroidism: Secondary | ICD-10-CM | POA: Diagnosis not present

## 2014-07-09 DIAGNOSIS — E119 Type 2 diabetes mellitus without complications: Secondary | ICD-10-CM | POA: Diagnosis not present

## 2014-07-09 DIAGNOSIS — Z6828 Body mass index (BMI) 28.0-28.9, adult: Secondary | ICD-10-CM | POA: Diagnosis not present

## 2014-07-09 DIAGNOSIS — M1990 Unspecified osteoarthritis, unspecified site: Secondary | ICD-10-CM | POA: Diagnosis present

## 2014-07-09 DIAGNOSIS — E1122 Type 2 diabetes mellitus with diabetic chronic kidney disease: Secondary | ICD-10-CM | POA: Diagnosis present

## 2014-07-09 DIAGNOSIS — I10 Essential (primary) hypertension: Secondary | ICD-10-CM | POA: Diagnosis not present

## 2014-07-09 DIAGNOSIS — D649 Anemia, unspecified: Secondary | ICD-10-CM | POA: Diagnosis not present

## 2014-07-09 DIAGNOSIS — R5381 Other malaise: Secondary | ICD-10-CM | POA: Diagnosis not present

## 2014-07-09 DIAGNOSIS — I69820 Aphasia following other cerebrovascular disease: Secondary | ICD-10-CM | POA: Diagnosis not present

## 2014-07-09 DIAGNOSIS — I63311 Cerebral infarction due to thrombosis of right middle cerebral artery: Secondary | ICD-10-CM | POA: Diagnosis not present

## 2014-07-09 DIAGNOSIS — Z853 Personal history of malignant neoplasm of breast: Secondary | ICD-10-CM | POA: Diagnosis not present

## 2014-07-09 DIAGNOSIS — K649 Unspecified hemorrhoids: Secondary | ICD-10-CM | POA: Diagnosis present

## 2014-07-09 DIAGNOSIS — N183 Chronic kidney disease, stage 3 (moderate): Secondary | ICD-10-CM | POA: Diagnosis not present

## 2014-07-09 DIAGNOSIS — I6330 Cerebral infarction due to thrombosis of unspecified cerebral artery: Secondary | ICD-10-CM | POA: Diagnosis not present

## 2014-07-09 DIAGNOSIS — C50912 Malignant neoplasm of unspecified site of left female breast: Secondary | ICD-10-CM | POA: Diagnosis not present

## 2014-07-09 DIAGNOSIS — E876 Hypokalemia: Secondary | ICD-10-CM | POA: Diagnosis present

## 2014-07-09 DIAGNOSIS — D509 Iron deficiency anemia, unspecified: Secondary | ICD-10-CM | POA: Diagnosis not present

## 2014-07-09 DIAGNOSIS — R4701 Aphasia: Secondary | ICD-10-CM | POA: Diagnosis not present

## 2014-07-09 DIAGNOSIS — Z886 Allergy status to analgesic agent status: Secondary | ICD-10-CM | POA: Diagnosis not present

## 2014-07-09 DIAGNOSIS — R278 Other lack of coordination: Secondary | ICD-10-CM | POA: Diagnosis not present

## 2014-07-09 DIAGNOSIS — R1310 Dysphagia, unspecified: Secondary | ICD-10-CM | POA: Diagnosis not present

## 2014-07-09 DIAGNOSIS — D500 Iron deficiency anemia secondary to blood loss (chronic): Secondary | ICD-10-CM | POA: Diagnosis present

## 2014-07-09 DIAGNOSIS — M25669 Stiffness of unspecified knee, not elsewhere classified: Secondary | ICD-10-CM | POA: Diagnosis not present

## 2014-07-09 DIAGNOSIS — E039 Hypothyroidism, unspecified: Secondary | ICD-10-CM | POA: Diagnosis not present

## 2014-07-09 DIAGNOSIS — Z7902 Long term (current) use of antithrombotics/antiplatelets: Secondary | ICD-10-CM | POA: Diagnosis not present

## 2014-07-09 DIAGNOSIS — N189 Chronic kidney disease, unspecified: Secondary | ICD-10-CM | POA: Diagnosis not present

## 2014-07-09 DIAGNOSIS — E663 Overweight: Secondary | ICD-10-CM | POA: Diagnosis present

## 2014-07-09 DIAGNOSIS — N182 Chronic kidney disease, stage 2 (mild): Secondary | ICD-10-CM | POA: Diagnosis not present

## 2014-07-09 DIAGNOSIS — I639 Cerebral infarction, unspecified: Secondary | ICD-10-CM | POA: Diagnosis not present

## 2014-07-09 DIAGNOSIS — Z79899 Other long term (current) drug therapy: Secondary | ICD-10-CM | POA: Diagnosis not present

## 2014-07-09 DIAGNOSIS — F0390 Unspecified dementia without behavioral disturbance: Secondary | ICD-10-CM | POA: Diagnosis present

## 2014-07-09 DIAGNOSIS — F4489 Other dissociative and conversion disorders: Secondary | ICD-10-CM | POA: Diagnosis not present

## 2014-07-09 DIAGNOSIS — Z8673 Personal history of transient ischemic attack (TIA), and cerebral infarction without residual deficits: Secondary | ICD-10-CM | POA: Diagnosis not present

## 2014-07-09 DIAGNOSIS — Z880 Allergy status to penicillin: Secondary | ICD-10-CM | POA: Diagnosis not present

## 2014-07-09 LAB — GLUCOSE, CAPILLARY
GLUCOSE-CAPILLARY: 180 mg/dL — AB (ref 65–99)
Glucose-Capillary: 103 mg/dL — ABNORMAL HIGH (ref 65–99)
Glucose-Capillary: 144 mg/dL — ABNORMAL HIGH (ref 65–99)

## 2014-07-09 MED ORDER — CLOPIDOGREL BISULFATE 75 MG PO TABS: 75.0000 mg | ORAL_TABLET | Freq: Every day | ORAL | Status: AC

## 2014-07-09 MED ORDER — HYDROCODONE-ACETAMINOPHEN 10-325 MG PO TABS: 1.0000 | ORAL_TABLET | Freq: Four times a day (QID) | ORAL | Status: DC | PRN

## 2014-07-09 MED ORDER — INSULIN ASPART 100 UNIT/ML ~~LOC~~ SOLN: 0.0000 [IU] | Freq: Three times a day (TID) | SUBCUTANEOUS | Status: DC

## 2014-07-09 MED ORDER — CYANOCOBALAMIN 1000 MCG PO TABS: 1000.0000 ug | ORAL_TABLET | Freq: Every day | ORAL | Status: AC

## 2014-07-09 NOTE — Progress Notes (Signed)
Patient refused to get OOB this AM. RN and NA at bedside attempted to get pt OOB to chair but patient unable and refused once again. Will continue to monitor.  Ave Filter, RN

## 2014-07-09 NOTE — Progress Notes (Addendum)
Attempted to call report to [facility], no answer at this time. Left message for nurse to call back. Family notified of discharge to facility. All belongings packed. Awaiting for transport.  Able to reach nurse at [facility]. Report given to [facility].  [employer]assess and cellphone are in belonging bag. Ave Filter, RN

## 2014-07-09 NOTE — Progress Notes (Signed)
STROKE TEAM PROGRESS NOTE   HISTORY [name] is an 81 y.o. female hx of DM, HTN, prior CVA, breast cancer presenting with slurred speech and confusion. Code stroke activated. LKW at 1600 07/05/2014. Around 2015 daughter found her confused, slurred speech, noted right gaze preference. EMS arrival noted blood sugar of 46, gave glucose with repeat blood sugar of 166. They report gaze preference resolved but she continued to have slurred speech and confusion.   CT head imaging reviewed, shows no acute infarct but presence of chronic left occipital/temporal and bilateral basal ganglia infarcts. Initial NIHSS of 3 for dysarthria, limb ataxia and RLE sensory deficits  Patient was not administered TPA secondary to  outside IV tPA window. She was admitted for further evaluation and treatment.   SUBJECTIVE (INTERVAL HISTORY) Dr. Eliseo Squires plans to send to SNF. Pt currently yelling with NT placing socks on feet. Refuses to be discharged (lacks capacity per Dr. Eliseo Squires).   OBJECTIVE Temp:  [98 F (36.7 C)-98.7 F (37.1 C)] 98.7 F (37.1 C) (06/20 1007) Pulse Rate:  [68-75] 68 (06/20 1007) Cardiac Rhythm:  [-] Normal sinus rhythm (06/19 2044) Resp:  [16-18] 18 (06/20 1007) BP: (112-135)/(45-54) 133/49 mmHg (06/20 1007) SpO2:  [92 %-100 %] 92 % (06/20 1007)   Recent Labs Lab 07/08/14 1145 07/08/14 1838 07/08/14 2144 07/09/14 0608 07/09/14 1057  GLUCAP 176* 123* 160* 103* 180*    Recent Labs Lab 07/05/14 2216 07/05/14 2227 07/08/14 1020  NA 137 138 137  K 3.9 4.6 3.8  CL 104 103 102  CO2 24  --  27  GLUCOSE 108* 113* 128*  BUN 25* 39* 23*  CREATININE 1.30* 1.40* 1.52*  CALCIUM 8.7*  --  8.6*    Recent Labs Lab 07/05/14 2216 07/08/14 1020  AST 23 18  ALT 9* 9*  ALKPHOS 58 58  BILITOT 0.5 0.8  PROT 6.0* 6.6  ALBUMIN 2.9* 2.7*    Recent Labs Lab 07/05/14 2216 07/05/14 2227  WBC 9.8  --   NEUTROABS 7.8*  --   HGB 10.4* 14.3  HCT 32.7* 42.0  MCV 76.9*  --   PLT 265   --    No results for input(s): CKTOTAL, CKMB, CKMBINDEX, TROPONINI in the last 168 hours. No results for input(s): LABPROT, INR in the last 72 hours. No results for input(s): COLORURINE, LABSPEC, [place], GLUCOSEU, HGBUR, BILIRUBINUR, KETONESUR, PROTEINUR, UROBILINOGEN, NITRITE, LEUKOCYTESUR in the last 72 hours.  Invalid input(s): APPERANCEUR     Component Value Date/Time   CHOL 110 07/07/2014 0500   TRIG 89 07/07/2014 0500   HDL 37* 07/07/2014 0500   CHOLHDL 3.0 07/07/2014 0500   VLDL 18 07/07/2014 0500   LDLCALC 55 07/07/2014 0500   Lab Results  Component Value Date   HGBA1C 5.4 07/05/2014      Component Value Date/Time   LABOPIA NONE DETECTED 07/05/2014 2245   COCAINSCRNUR NONE DETECTED 07/05/2014 2245   LABBENZ POSITIVE* 07/05/2014 2245   AMPHETMU NONE DETECTED 07/05/2014 2245   THCU NONE DETECTED 07/05/2014 2245   LABBARB NONE DETECTED 07/05/2014 2245     VITB12 274.     Recent Labs Lab 07/05/14 2216  ETH <5    EEG 07/06/2014 This predominantly drowsy and asleep EEG is normal.   Ct Head Wo Contrast 07/05/2014    1. No acute intracranial pathology seen to explain the patient's symptoms.  2. Moderate cortical volume loss and scattered small vessel ischemic microangiopathy.  3. Large chronic infarct at the left occipital and temporal  lobes, with associated encephalomalacia.  4. Chronic lacunar infarcts at the basal ganglia bilaterally.   Mr Brain Wo Contrast 07/06/2014    1. Punctate 5 mm acute ischemic infarct within the right temporal lobe. No associated hemorrhage or mass effect.  2. No other acute intracranial process.  3. Remote left temporoccipital infarct with additional small remote lacunar infarcts within the bilateral basal ganglia and pons.  4. Advanced cerebral atrophy with moderate chronic microvascular ischemic disease.     Carotid Doppler  There is 1-39% bilateral ICA stenosis. Vertebral artery flow is antegrade.     Physical Exam: Gen:  NAD, Frail elderly african american lady. Afebrile. Head is nontraumatic. Neck is supple without bruit. Cardiac exam; +SEM, RRR. Lungs are clear to auscultation. Distal pulses are well felt. Eyes: anicteric sclerae, moist conjunctivae                    CV: no MRG, no carotid bruits, no peripheral edema Mental Status: Alert, follows commands, poor historian, oriented to self, location, situation Neuro: Focused Neurologic Exam Speech:    No aphasia, mild dysarthria Cranial Nerves:    The pupils are equal, round, and reactive to light.  Attempted, Fundi not visualized.  Impaired upgaze otherwise EOMI. No gaze preference, conjugate gaze. Visual fields full, blinks to threat bilaterally. Face symmetric, Tongue midline. Hearing impaired to voice. Shoulder shrug intact. Cough and gag intact. Motor Observation:    Head tremor - persistent -- ; also hand tremor L> R; long hx per pt Strength:    Upper extremities are antigravity and equal. Spontaneous movements and purposeful - arms. Right leg is 2/5 and L 1/5. Patient reports pain in the left leg and does not want to try to elevate it. (C/O marked R should and R knee pain) Sensation:  Intact to LT and pain Plantars downgoing.  Gait: Unable to test Cortical Sensory Modalities: No visual or sensory neglect.      ASSESSMENT/PLAN Ms. [name] is a 81 y.o. female with history of DM, HTN, prior CVA, breast cancer presenting with slurred speech and confusion. She did not receive IV t-PA due to delay in arrival.   Stroke:  Non-dominant right temporal lobe infarct secondary to small vessel disease source  Resultant  Neuro deficits improved  MRI  Tiny R temporal lobe infarct  Carotid Doppler No significant stenosis   2D Echo  EF 55-60%. Cardiac source of emboli identified.  EEG - normal   LDL 55  Heparin 5000 units sq tid for VTE prophylaxis DIET SOFT Room service appropriate?: Yes with Assist; Fluid consistency:: Thin Diet - low  sodium heart healthy Diet Carb Modified  no antithrombotic prior to admission, now on no antithrombotic . She is allergic to aspirin (hives). Will start plavix 75 for secondary stroke prevention.   Patient counseled to be compliant with her antithrombotic medications  Ongoing aggressive stroke risk factor management  Therapy recommendations:  SNF  Disposition:  SNF.  [place] for discharge from stroke standpoint   Follow up Dr. Leonie Man, stroke clinic, 2 months. (order written)  Stroke team will sign off  Hypertension  Stable  Diabetes  Hypoglycemia on arrival, Glu 46, treated, up to 166  HgbA1c 5.1 03/2014, at goal < 7.0  Other Stroke Risk Factors  Advanced age  Hx stroke/TIA   Other Active Problems  Progressive L shoulder pain, limited movement "for a while" per pt  Hx L breast cancer s/p mastectomy, on observation after completion of 5 yrs [employer]  06/2013  Anemia of chronic disease  CKD stage 3 , Cr 1.3  OA, uses walker, brace L knee   ESSENTIAL TREMOR  Suspect mild cognitive impairment/ mild vascular dementia -- V12 -- relatively low,  will replace; and homocysteine -Pending   Hospital day # [place] for Pager information 07/09/2014 11:35 AM  I have personally examined this patient, reviewed notes, independently viewed imaging studies, participated in medical decision making and plan of care. I have made any additions or clarifications directly to the above note. Agree with note above.    Antony Contras, MD Medical Director [facility] Pager: [phone] 07/09/2014 3:56 PM  To contact Stroke Continuity provider, please refer to [email or web address] After hours, contact General Neurology

## 2014-07-09 NOTE — Progress Notes (Signed)
Patient still in room waiting for transport. Family called back wanting to know why patient hasn't been d/c'ed. [facility] called and confirmed that she is on the list to be picked up. On coming shift RN aware.   Ave Filter, RN

## 2014-07-09 NOTE — Clinical Social Work Note (Signed)
Clinical Social Worker has completed psychosocial assessment with patient and pt's family. Full psychosocial assessment to follow.  Patient's family chooses bed at [facility]. FL-2 signed.   Glendon Axe, MSW, LCSWA [phone] 07/09/2014 1:40 PM

## 2014-07-09 NOTE — Discharge Summary (Signed)
Physician Discharge Summary  [name] YQM:[id number] DOB: October 28, 1930 DOA: 07/05/2014  PCP: Antony Blackbird, MD  Admit date: 07/05/2014 Discharge date: 07/09/2014  Time spent: 35 minutes  Recommendations for Outpatient Follow-up:  1. BMP 2 weeks re Cr  Discharge Diagnoses:  Principal Problem:   Aphasia Active Problems:   HTN (hypertension)   Hypothyroidism   DM2 (diabetes mellitus, type 2)   Cerebral thrombosis with cerebral infarction   CVA (cerebral infarction)   Discharge Condition: improved  Diet recommendation:   Filed Weights   07/05/14 2214 07/06/14 0048  Weight: 80 kg (176 lb 5.9 oz) 80.287 kg (177 lb)    History of present illness:  [name] is a 81 y.o. female h/o HTN, DM2, prior CVA. Patient presents to the ED with c/o slurred speech and confusion. Code stroke activated but patients symptoms appear to be improving. Initially daughter also describes a gaze preference (to the left daughter thinks). EMS initially found her BGL to be low at 46, this improved with D50.  Hospital Course:  CVA- MRI + -echo:grade 1 diastolic dysfunction  -carotid ok -FLP: LDL 55 -HgbA1C: 5.4  CKD- stage 3 - baseline 1.4  HTN - continue home meds Hypothyroidism - continue synthroid DM2 - low dose SSI ac/hs Low-normal B12- replace  Procedures:  echo  Consultations:  neuro  Discharge Exam: Filed Vitals:   07/09/14 1007  BP: 133/49  Pulse: 68  Temp: 98.7 F (37.1 C)  Resp: 18    General: pleasantly confused Cardiovascular: rrr Respiratory: clear  Discharge Instructions   Discharge Instructions    Ambulatory referral to Neurology    Complete by:  As directed   Please schedule post stroke follow up in 2 months.     Diet - low sodium heart healthy    Complete by:  As directed      Diet Carb Modified    Complete by:  As directed      Increase activity slowly    Complete by:  As directed           Current Discharge Medication List    START  taking these medications   Details  clopidogrel (PLAVIX) 75 MG tablet Take 1 tablet (75 mg total) by mouth daily.    insulin aspart (NOVOLOG) 100 UNIT/ML injection Inject 0-9 Units into the skin 3 (three) times daily with meals. Qty: 10 mL, Refills: 11    vitamin B-12 1000 MCG tablet Take 1 tablet (1,000 mcg total) by mouth daily.      CONTINUE these medications which have CHANGED   Details  HYDROcodone-acetaminophen (NORCO) 10-325 MG per tablet Take 1 tablet by mouth every 6 (six) hours as needed for moderate pain or severe pain. Qty: 30 tablet, Refills: 0      CONTINUE these medications which have NOT CHANGED   Details  Ascorbic Acid (VITAMIN C) 1000 MG tablet Take 1,000 mg by mouth daily.    calcium carbonate (CALCIUM 600) 600 MG TABS tablet Take 600 mg by mouth 2 (two) times daily with a meal.    Cholecalciferol (VITAMIN D) 2000 UNITS tablet Take 2,000 Units by mouth daily.    ferrous sulfate 325 (65 FE) MG tablet Take 325 mg by mouth 2 (two) times daily with a meal.    indapamide (LOZOL) 1.25 MG tablet TAKE ONE TABLET BY MOUTH IN THE MORNING Qty: 90 tablet, Refills: 1    levothyroxine (SYNTHROID, LEVOTHROID) 88 MCG tablet TAKE ONE TABLET BY MOUTH ONCE DAILY Qty: 90 tablet, Refills:  1    metoprolol (TOPROL-XL) 50 MG 24 hr tablet Take 50 mg by mouth daily.     rOPINIRole (REQUIP) 3 MG tablet Take 3 mg by mouth at bedtime.     INSULIN SYRINGE 1CC/29G 29G X 1/2" 1 ML MISC by Does not apply route.        STOP taking these medications     insulin NPH-regular Human (NOVOLIN 70/30) (70-30) 100 UNIT/ML injection      irbesartan (AVAPRO) 150 MG tablet      potassium chloride SA (K-DUR,KLOR-CON) 20 MEQ tablet        Allergies  Allergen Reactions  . Aspirin Hives  . Penicillins Hives   Follow-up Information    Follow up with SETHI,PRAMOD, MD In 2 months.   Specialties:  Neurology, Radiology   Why:  Stroke Clinic, Office will call you with appointment date & time    Contact information:   [place] [id number] [phone]       Follow up with FULP, CAMMIE, MD In 1 week.   Specialty:  Family Medicine   Contact information:   [address] [place] [address] [phone]        The results of significant diagnostics from this hospitalization (including imaging, microbiology, ancillary and laboratory) are listed below for reference.    Significant Diagnostic Studies: Ct Head Wo Contrast  07/05/2014   CLINICAL DATA:  Code stroke. Right-sided gaze and garbled speech. Initial encounter.  EXAM: CT HEAD WITHOUT CONTRAST  TECHNIQUE: Contiguous axial images were obtained from the base of the skull through the vertex without intravenous contrast.  COMPARISON:  CT of the head performed 03/20/2010  FINDINGS: There is no evidence of acute infarction, mass lesion, or intra- or extra-axial hemorrhage on CT.  Prominence of the ventricles and sulci reflects moderate cortical volume loss. A large chronic infarct is noted at the left occipital and temporal lobes, with associated encephalomalacia. Mild cerebellar atrophy is noted. Scattered periventricular and subcortical white matter change likely reflects small vessel ischemic microangiopathy. Chronic lacunar infarcts are seen at the basal ganglia bilaterally.  The brainstem and fourth ventricle are within normal limits. No mass effect or midline shift is seen.  There is no evidence of fracture; visualized osseous structures are unremarkable in appearance. The orbits are within normal limits. The paranasal sinuses and mastoid air cells are well-aerated. No significant soft tissue abnormalities are seen.  IMPRESSION: 1. No acute intracranial pathology seen to explain the patient's symptoms. 2. Moderate cortical volume loss and scattered small vessel ischemic microangiopathy. 3. Large chronic infarct at the left occipital and temporal lobes, with associated encephalomalacia. 4. Chronic lacunar  infarcts at the basal ganglia bilaterally. These results were called by telephone at the time of interpretation on 07/05/2014 at 10:08 pm to Dr. Janann Colonel, who verbally acknowledged these results.   Electronically Signed   By: Garald Balding M.D.   On: 07/05/2014 22:08   Mr Brain Wo Contrast  07/06/2014   CLINICAL DATA:  Initial evaluation for slurred speech and confusion.  EXAM: MRI HEAD WITHOUT CONTRAST  TECHNIQUE: Multiplanar, multiecho pulse sequences of the brain and surrounding structures were obtained without intravenous contrast.  COMPARISON:  Prior CT from earlier the same day.  FINDINGS: Diffuse prominence of the CSF containing spaces is compatible with generalized cerebral atrophy. Patchy and confluent T2/FLAIR hyperintensity within the periventricular and deep white matter both cerebral hemispheres most consistent with chronic small vessel ischemic disease, moderate in nature. Extensive  encephalomalacia present within the left temporal occipital region, likely related to remote ischemia.  Small remote lacunar infarcts present within the pons and bilateral basal ganglia  There is a punctate 5 mm focus of restricted diffusion within the right temporal lobe, compatible with small acute ischemic infarct (series 3, image 17). No associated hemorrhage or mass effect. No other infarct identified. Normal intravascular flow voids are preserved. Probable atheromatous disease within the right vertebral artery noted. No acute or chronic intracranial hemorrhage.  No mass lesion or midline shift. No mass effect. Ventricular prominence related of global parenchymal volume loss present without hydrocephalus. No extra-axial fluid collection.  Craniocervical junction within normal limits. Advanced multilevel degenerative disc disease with associated stenoses noted within the visualized upper cervical spine.  Pituitary gland normal.  No acute abnormality about the orbits.  Paranasal sinuses are clear. Scattered opacity  present within the mastoid air cells bilaterally, right slightly greater than left. The inner ear structures normal.  Bone marrow signal intensity within normal limits. Scalp soft tissues unremarkable.  IMPRESSION: 1. Punctate 5 mm acute ischemic infarct within the right temporal lobe. No associated hemorrhage or mass effect. 2. No other acute intracranial process. 3. Remote left temporoccipital infarct with additional small remote lacunar infarcts within the bilateral basal ganglia and pons. 4. Advanced cerebral atrophy with moderate chronic microvascular ischemic disease.   Electronically Signed   By: Jeannine Boga M.D.   On: 07/06/2014 00:52    Microbiology: No results found for this or any previous visit (from the past 240 hour(s)).   Labs: Basic Metabolic Panel:  Recent Labs Lab 07/05/14 2216 07/05/14 2227 07/08/14 1020  NA 137 138 137  K 3.9 4.6 3.8  CL 104 103 102  CO2 24  --  27  GLUCOSE 108* 113* 128*  BUN 25* 39* 23*  CREATININE 1.30* 1.40* 1.52*  CALCIUM 8.7*  --  8.6*   Liver Function Tests:  Recent Labs Lab 07/05/14 2216 07/08/14 1020  AST 23 18  ALT 9* 9*  ALKPHOS 58 58  BILITOT 0.5 0.8  PROT 6.0* 6.6  ALBUMIN 2.9* 2.7*   No results for input(s): LIPASE, AMYLASE in the last 168 hours. No results for input(s): AMMONIA in the last 168 hours. CBC:  Recent Labs Lab 07/05/14 2216 07/05/14 2227  WBC 9.8  --   NEUTROABS 7.8*  --   HGB 10.4* 14.3  HCT 32.7* 42.0  MCV 76.9*  --   PLT 265  --    Cardiac Enzymes: No results for input(s): CKTOTAL, CKMB, CKMBINDEX, TROPONINI in the last 168 hours. BNP: BNP (last 3 results) No results for input(s): BNP in the last 8760 hours.  ProBNP (last 3 results) No results for input(s): PROBNP in the last 8760 hours.  CBG:  Recent Labs Lab 07/08/14 0551 07/08/14 1145 07/08/14 1838 07/08/14 2144 07/09/14 0608  GLUCAP 139* 176* 123* 160* 103*       Signed:  Carolynne Schuchard  [facility] 07/09/2014, 10:32 AM

## 2014-07-09 NOTE — Clinical Social Work Placement (Signed)
   CLINICAL SOCIAL WORK PLACEMENT  NOTE  Date:  07/09/2014  Patient Details  Name: [name] MRN: [id number] Date of Birth: 05-08-1930  Clinical Social Work is seeking post-discharge placement for this patient at the [place] level of care (*CSW will initial, date and re-position this form in  chart as items are completed):  Yes   Patient/family provided with [place] Work Department's list of facilities offering this level of care within the geographic area requested by the patient (or if unable, by the patient's family).  Yes   Patient/family informed of their freedom to choose among providers that offer the needed level of care, that participate in Medicare, Medicaid or managed care program needed by the patient, have an available bed and are willing to accept the patient.  Yes   Patient/family informed of [place]'s ownership interest in [facility] and [facility], as well as of the fact that they are under no obligation to receive care at these facilities.  PASRR submitted to EDS on 07/09/14     PASRR number received on 07/09/14     Existing PASRR number confirmed on       FL2 transmitted to all facilities in geographic area requested by pt/family on 07/09/14     FL2 transmitted to all facilities within larger geographic area on       Patient informed that his/her managed care company has contracts with or will negotiate with certain facilities, including the following:   (YES )     Yes   Patient/family informed of bed offers received.  Patient chooses bed at  [facility] [place] )     Physician recommends and patient chooses bed at      Patient to be transferred to  [facility] [place] ) on 07/09/14.  Patient to be transferred to facility by  [facility] )     Patient family notified on 07/09/14 of transfer.  Name of family member notified:   (Pt's dtr, [name] )     PHYSICIAN Please prepare  prescriptions     Additional Comment:    _______________________________________________ Rozell Searing, LCSW 07/09/2014, 2:23 PM

## 2014-07-09 NOTE — Clinical Social Work Note (Signed)
Clinical Social Worker facilitated patient discharge including contacting patient family and facility to confirm patient discharge plans.  Clinical information faxed to facility and family agreeable with plan.  CSW arranged ambulance transport via [facility] to [facility].  RN to call report prior to discharge.  DC packet prepared and on chart for transport with number for report.   Clinical Social Worker will sign off for now as social work intervention is no longer needed. Please consult [place] again if new need arises.  Glendon Axe, MSW, LCSWA [phone] 07/09/2014 2:24 PM

## 2014-07-10 ENCOUNTER — Encounter: Payer: Self-pay | Admitting: Adult Health

## 2014-07-10 ENCOUNTER — Non-Acute Institutional Stay (SKILLED_NURSING_FACILITY): Payer: Medicare Other | Admitting: Adult Health

## 2014-07-10 DIAGNOSIS — E119 Type 2 diabetes mellitus without complications: Secondary | ICD-10-CM

## 2014-07-10 DIAGNOSIS — E039 Hypothyroidism, unspecified: Secondary | ICD-10-CM

## 2014-07-10 DIAGNOSIS — G250 Essential tremor: Secondary | ICD-10-CM

## 2014-07-10 DIAGNOSIS — N183 Chronic kidney disease, stage 3 unspecified: Secondary | ICD-10-CM

## 2014-07-10 DIAGNOSIS — D509 Iron deficiency anemia, unspecified: Secondary | ICD-10-CM | POA: Diagnosis not present

## 2014-07-10 DIAGNOSIS — I10 Essential (primary) hypertension: Secondary | ICD-10-CM | POA: Diagnosis not present

## 2014-07-10 DIAGNOSIS — I63311 Cerebral infarction due to thrombosis of right middle cerebral artery: Secondary | ICD-10-CM

## 2014-07-10 LAB — HOMOCYSTEINE: Homocysteine: 22.3 umol/L — ABNORMAL HIGH (ref 0.0–15.0)

## 2014-07-10 NOTE — Progress Notes (Signed)
Patient ID: [name], female   DOB: 1930-05-09, 81 y.o.   MRN: [id number]   07/10/2014  Facility:  Nursing Home Location:  [place] Room Number: 1002-1 LEVEL OF CARE:  SNF (31)   Chief Complaint  Patient presents with  . Hospitalization Follow-up    CVA, hypertension, diabetes mellitus, essential tremor, CKD stage III, anemia and hypothyroidism    HISTORY OF PRESENT ILLNESS:  This is an 81 year old female who has been admitted to [facility] on 07/09/14 from [facility]. She has PMH of hypertension, diabetes mellitus type 2, prior CVA, breast cancer status post left rest mastectomy and TIA. She presented to the ED with slurred speech and confusion. CT head shows no acute infarct but presence of chronic left occipital/temporal and bilateral basal ganglia infarcts. She was not administered with TPA secondary to outside IV TPA window. She was started on Plavix 75 mg daily.  She has been admitted for a short-term rehabilitation.  PAST MEDICAL HISTORY:  Past Medical History  Diagnosis Date  . Hemorrhoid   . Anemia   . Arthritis   . Stroke     CURRENT MEDICATIONS: Reviewed per MAR/see medication list  Allergies  Allergen Reactions  . Aspirin Hives  . Penicillins Hives     REVIEW OF SYSTEMS:  GENERAL: no change in appetite, no fatigue, no weight changes, no fever, chills or weakness RESPIRATORY: no cough, SOB, DOE, wheezing, hemoptysis CARDIAC: no chest pain, edema or palpitations GI: no abdominal pain, diarrhea, constipation, heart burn, nausea or vomiting  PHYSICAL EXAMINATION  GENERAL: no acute distress, normal body habitus SKIN:  Right heel has DTI, unstageable EYES: conjunctivae normal, sclerae normal, normal eye lids NECK: supple, trachea midline, no neck masses, no thyroid tenderness, no thyromegaly LYMPHATICS: no LAN in the neck, no supraclavicular LAN RESPIRATORY: breathing is even & unlabored, BS CTAB CARDIAC: RRR,  no murmur,no extra heart sounds, no edema GI: abdomen soft, normal BS, no masses, no tenderness, no hepatomegaly, no splenomegaly EXTREMITIES: RLE 2/5 with AFO; LLE 1/5 with floatation boot and knee brace; able to move BUE PSYCHIATRIC: the patient is alert & oriented to person, affect & behavior appropriate  LABS/RADIOLOGY: Labs reviewed: Basic Metabolic Panel:  Recent Labs  04/18/14 1539 07/05/14 2216 07/05/14 2227 07/08/14 1020  NA 134* 137 138 137  K 4.1 3.9 4.6 3.8  CL 100 104 103 102  CO2 31 24  --  27  GLUCOSE 75 108* 113* 128*  BUN 32* 25* 39* 23*  CREATININE 1.59* 1.30* 1.40* 1.52*  CALCIUM 9.1 8.7*  --  8.6*   Liver Function Tests:  Recent Labs  04/18/14 1539 07/05/14 2216 07/08/14 1020  AST 15 23 18   ALT 7 9* 9*  ALKPHOS 79 58 58  BILITOT 0.5 0.5 0.8  PROT 6.6 6.0* 6.6  ALBUMIN 3.3* 2.9* 2.7*   CBC:  Recent Labs  03/29/14 1248 05/24/14 1309 07/05/14 2216 07/05/14 2227  WBC 7.1 7.6 9.8  --   NEUTROABS 5.2 5.6 7.8*  --   HGB 9.8* 9.5* 10.4* 14.3  HCT 32.0* 30.7* 32.7* 42.0  MCV 78.1* 76.8* 76.9*  --   PLT 244 272 265  --    Lipid Panel:  Recent Labs  04/18/14 1539 07/07/14 0500  HDL 35.90* 37*   CBG:  Recent Labs  07/09/14 0608 07/09/14 1057 07/09/14 1610  GLUCAP 103* 180* 144*     Ct Head Wo Contrast  07/05/2014   CLINICAL DATA:  Code stroke. Right-sided gaze and garbled speech. Initial encounter.  EXAM: CT HEAD WITHOUT CONTRAST  TECHNIQUE: Contiguous axial images were obtained from the base of the skull through the vertex without intravenous contrast.  COMPARISON:  CT of the head performed 03/20/2010  FINDINGS: There is no evidence of acute infarction, mass lesion, or intra- or extra-axial hemorrhage on CT.  Prominence of the ventricles and sulci reflects moderate cortical volume loss. A large chronic infarct is noted at the left occipital and temporal lobes, with associated encephalomalacia. Mild cerebellar atrophy is noted.  Scattered periventricular and subcortical white matter change likely reflects small vessel ischemic microangiopathy. Chronic lacunar infarcts are seen at the basal ganglia bilaterally.  The brainstem and fourth ventricle are within normal limits. No mass effect or midline shift is seen.  There is no evidence of fracture; visualized osseous structures are unremarkable in appearance. The orbits are within normal limits. The paranasal sinuses and mastoid air cells are well-aerated. No significant soft tissue abnormalities are seen.  IMPRESSION: 1. No acute intracranial pathology seen to explain the patient's symptoms. 2. Moderate cortical volume loss and scattered small vessel ischemic microangiopathy. 3. Large chronic infarct at the left occipital and temporal lobes, with associated encephalomalacia. 4. Chronic lacunar infarcts at the basal ganglia bilaterally. These results were called by telephone at the time of interpretation on 07/05/2014 at 10:08 pm to Dr. Janann Colonel, who verbally acknowledged these results.   Electronically Signed   By: Garald Balding M.D.   On: 07/05/2014 22:08   Mr Brain Wo Contrast  07/06/2014   CLINICAL DATA:  Initial evaluation for slurred speech and confusion.  EXAM: MRI HEAD WITHOUT CONTRAST  TECHNIQUE: Multiplanar, multiecho pulse sequences of the brain and surrounding structures were obtained without intravenous contrast.  COMPARISON:  Prior CT from earlier the same day.  FINDINGS: Diffuse prominence of the CSF containing spaces is compatible with generalized cerebral atrophy. Patchy and confluent T2/FLAIR hyperintensity within the periventricular and deep white matter both cerebral hemispheres most consistent with chronic small vessel ischemic disease, moderate in nature. Extensive encephalomalacia present within the left temporal occipital region, likely related to remote ischemia.  Small remote lacunar infarcts present within the pons and bilateral basal ganglia  There is a punctate 5  mm focus of restricted diffusion within the right temporal lobe, compatible with small acute ischemic infarct (series 3, image 17). No associated hemorrhage or mass effect. No other infarct identified. Normal intravascular flow voids are preserved. Probable atheromatous disease within the right vertebral artery noted. No acute or chronic intracranial hemorrhage.  No mass lesion or midline shift. No mass effect. Ventricular prominence related of global parenchymal volume loss present without hydrocephalus. No extra-axial fluid collection.  Craniocervical junction within normal limits. Advanced multilevel degenerative disc disease with associated stenoses noted within the visualized upper cervical spine.  Pituitary gland normal.  No acute abnormality about the orbits.  Paranasal sinuses are clear. Scattered opacity present within the mastoid air cells bilaterally, right slightly greater than left. The inner ear structures normal.  Bone marrow signal intensity within normal limits. Scalp soft tissues unremarkable.  IMPRESSION: 1. Punctate 5 mm acute ischemic infarct within the right temporal lobe. No associated hemorrhage or mass effect. 2. No other acute intracranial process. 3. Remote left temporoccipital infarct with additional small remote lacunar infarcts within the bilateral basal ganglia and pons. 4. Advanced cerebral atrophy with moderate chronic microvascular ischemic disease.   Electronically Signed   By: Jeannine Boga M.D.   On: 07/06/2014  00:52    ASSESSMENT/PLAN:  CVA - for rehabilitation; continue Plavix 75 mg by mouth daily; follow-up with Dr. Leonie Man, neurology, in 2 months Hypertension - continue Lozol 1.25 mg by mouth every morning and metoprolol 50 mg 24 hour 1 tab by mouth daily; BP/HR Q shift X 1 week Diabetes mellitus, type II - hemoglobin A1c 5.3; continue NovoLog sliding scale subcutaneous 3 times a day with meals Essential tremor - continue Requip 3 mg by mouth daily at  bedtime CKD stage III - creatinine 1.4; will monitor Anemia, iron deficiency - hemoglobin 10.4; continue ferrous sulfate 325 mg 1 tab by mouth twice a day Hypothyroidism -  Synthroid 88 g by mouth daily   Goals of care:  Short-term rehabilitation  Labs/test ordered:  BMP, CBC, tsh in 1 week  Spent 50 minutes in patient care.    [facility], NP [employer] [phone]

## 2014-07-10 NOTE — Clinical Social Work Note (Signed)
Clinical Social Work Assessment  Patient Details  Name: [name] MRN: [id number] Date of Birth: 02/05/1930  Date of referral:  07/09/14               Reason for consult:  Facility Placement, Discharge Planning                Permission sought to share information with:  Case Manager, Facility Sport and exercise psychologist, PCP, Family Supports Permission granted to share information::  Yes, Verbal Permission Granted  Name::      [name])  Agency::   (SNF's )  Relationship::   (Daughter )  Contact Information:   [phone])  Housing/Transportation Living arrangements for the past 2 months:  Single Family Home Source of Information:  Patient, Adult Children Patient Interpreter Needed:  None Criminal Activity/Legal Involvement Pertinent to Current Situation/Hospitalization:  No - Comment as needed Significant Relationships:  Adult Children, Other Family Members Lives with:  Self Do you feel safe going back to the place where you live?  No Need for family participation in patient care:  Yes (Comment)  Care giving concerns: No care giving concerns reported at this time.    Social Worker assessment / plan:  Holiday representative met with patient in regards to post-acute placement for SNF. CSW introduced CSW role and SNF process.CSW reviewed and provided SNF list. Patient became tearful stating "Please don't put me in a nursing home, please promise me you won't put me away and forget about me". CSW explained short-term rehab focused placement. Pt declined SNF placement reporting she would like to return home. CSW assessed living arrangements and patient reported she is from home alone and will be able to manage on her own. Pt gave CSW permission to contact daughter, [name]. Later CSW discovered from bedside RN that patient is confused. CSW contacted patient's daughter to present SNF placement and daughter is agreeable. CSW completed FL-2 and faxed to SNF's in [place]. Pt's dtr  chooses bed at [facility]. [place] to email paperwork to pt's dtr. No further concerns reported by family. CSW will continue to follow pt and pt's family for continued support and to facilitate pt's discharge needs once medically stable.  Employment status:  Retired Forensic scientist:  Medicare PT Recommendations:  [place] / Referral to community resources:  [place]  Patient/Family's Response to care:  Pt alert and oriented x2. Patient expressed she does not want to be put away at a nursing home. Pt's dtr agreeable to NF search and placement at [facility]. Pt's dtr aware of pt refusing SNF however cannot provide 24/7 assistance at home due to working two jobs. Pt's dtr accepting and appreciated social work intervention.  Patient/Family's Understanding of and Emotional Response to Diagnosis, Current Treatment, and Prognosis:  Pt does not understand why she requires SNF placement and wants to return home.   Emotional Assessment Appearance:  Appears stated age Attitude/Demeanor/Rapport:  Apprehensive Affect (typically observed):  Anxious, Restless, Irritable, Tearful/Crying Orientation:  Oriented to Self, Oriented to Place, Oriented to  Time Alcohol / Substance use:  Never Used Psych involvement (Current and /or in the community):  No (Comment)  Discharge Needs  Concerns to be addressed:  No discharge needs identified Readmission within the last 30 days:  No Current discharge risk:  None Barriers to Discharge:  No Barriers Identified   Glendon Axe, MSW, [place] [phone]

## 2014-07-11 ENCOUNTER — Telehealth: Payer: Self-pay

## 2014-07-11 ENCOUNTER — Non-Acute Institutional Stay (SKILLED_NURSING_FACILITY): Payer: Medicare Other | Admitting: Internal Medicine

## 2014-07-11 DIAGNOSIS — R1310 Dysphagia, unspecified: Secondary | ICD-10-CM | POA: Diagnosis not present

## 2014-07-11 DIAGNOSIS — D631 Anemia in chronic kidney disease: Secondary | ICD-10-CM

## 2014-07-11 DIAGNOSIS — E039 Hypothyroidism, unspecified: Secondary | ICD-10-CM | POA: Diagnosis not present

## 2014-07-11 DIAGNOSIS — N183 Chronic kidney disease, stage 3 unspecified: Secondary | ICD-10-CM

## 2014-07-11 DIAGNOSIS — I10 Essential (primary) hypertension: Secondary | ICD-10-CM | POA: Diagnosis not present

## 2014-07-11 DIAGNOSIS — I639 Cerebral infarction, unspecified: Secondary | ICD-10-CM

## 2014-07-11 DIAGNOSIS — N189 Chronic kidney disease, unspecified: Secondary | ICD-10-CM

## 2014-07-11 DIAGNOSIS — R5381 Other malaise: Secondary | ICD-10-CM | POA: Diagnosis not present

## 2014-07-11 DIAGNOSIS — E119 Type 2 diabetes mellitus without complications: Secondary | ICD-10-CM | POA: Diagnosis not present

## 2014-07-11 NOTE — Telephone Encounter (Signed)
Pts needs to make an appt as soon as possible with Dr.Kumar for low blood sugar readings. Dr.Kumar and Colletta [place] have approved this.

## 2014-07-11 NOTE — Progress Notes (Signed)
Patient ID: [name], female   DOB: [birth year], 81 y.o.   MRN: [id number]     [facility]   PCP: FULP, CAMMIE, MD  Code Status: full code  Allergies  Allergen Reactions  . Aspirin Hives  . Penicillins Hives    Chief Complaint  Patient presents with  . New Admit To SNF     HPI:  81 year old patient is here for short term rehabilitation post hospital admission from 07/05/14-07/09/14 with aphasia. Ct head did not show any acute abnormalities but mri brain showed a punctate 5 mm ischemic infarct in right temporal lobe. Neurology was consulted, she was started on plavix. Echocardiogram showed grade 1 diastolic dysfunction and EEG showed no seizure activity. She is seen in her room today. She complaints of being unable to feed herself and needing assistance. Her friend is in the room and mentions she was taking mechanical soft diet at home. She has left sided weakness from old CVA. She denies any other concern She has PMH of cva, dm type 2, HTN, hypothyroidism among others.   Review of Systems:  Constitutional: Negative for fever, chills, diaphoresis.  HENT: Negative for headache, congestion, nasal discharge Eyes: Negative for eye pain. Legally blind  Respiratory: Negative for cough, shortness of breath and wheezing.   Cardiovascular: Negative for chest pain, palpitations, leg swelling.  Gastrointestinal: Negative for heartburn, nausea, vomiting, abdominal pain Genitourinary: Negative for dysuria Musculoskeletal: Negative for back pain, falls Skin: Negative for itching, rash.  Neurological: Negative for dizziness, tingling Psychiatric/Behavioral: Negative for depression   Past Medical History  Diagnosis Date  . Hemorrhoid   . Anemia   . Arthritis   . Stroke    Past Surgical History  Procedure Laterality Date  . Breast lumpectomy      LEFT  . Abdominal hysterectomy      PARTIAL  . Mastectomy  05/2008    Left   Social History:  reports that she has never smoked. She does not have any smokeless tobacco history on file. She reports that she does not drink alcohol. Her drug history is not on file.  No family history on file.  Medications: Patient's Medications  New Prescriptions   No medications on file  Previous Medications   ASCORBIC ACID (VITAMIN C) 1000 MG TABLET    Take 1,000 mg by mouth daily.   CALCIUM CARBONATE (CALCIUM 600) 600 MG TABS TABLET    Take 600 mg by mouth 2 (two) times daily with a meal.   CHOLECALCIFEROL (VITAMIN D) 2000 UNITS TABLET    Take 2,000 Units by mouth daily.   CLOPIDOGREL (PLAVIX) 75 MG TABLET    Take 1 tablet (75 mg total) by mouth daily.   FERROUS SULFATE 325 (65 FE) MG TABLET    Take 325 mg by mouth 2 (two) times daily with a meal.   HYDROCODONE-ACETAMINOPHEN (NORCO) 10-325 MG PER TABLET    Take 1 tablet by mouth every 6 (six) hours as needed for moderate pain or severe pain.   INDAPAMIDE (LOZOL) 1.25 MG TABLET    TAKE ONE TABLET BY MOUTH IN THE MORNING   INSULIN ASPART (NOVOLOG) 100 UNIT/ML INJECTION    Inject 0-9 Units into the skin 3 (three) times daily with meals.   INSULIN SYRINGE 1CC/29G 29G X 1/2" 1 ML MISC    by Does not apply route.     LEVOTHYROXINE (SYNTHROID, LEVOTHROID) 88 MCG TABLET    TAKE ONE TABLET BY MOUTH ONCE DAILY  METOPROLOL (TOPROL-XL) 50 MG 24 HR TABLET    Take 50 mg by mouth daily.    ROPINIROLE (REQUIP) 3 MG TABLET    Take 3 mg by mouth at bedtime.    VITAMIN B-12 1000 MCG TABLET    Take 1 tablet (1,000 mcg total) by mouth daily.  Modified Medications   No medications on file  Discontinued Medications   No medications on file     Physical Exam: Filed Vitals:   07/11/14 1011  BP: 148/69  Pulse: 68  Temp: 100 F (37.8 C)  Resp: 16  SpO2: 95%    General- elderly female, in no acute distress Head- normocephalic, atraumatic Throat- moist mucus membrane Eyes- no pallor, no icterus, no discharge, normal conjunctiva, normal sclera Neck- no  cervical lymphadenopathy, no jugular vein distension Cardiovascular- normal s1,s2, no murmurs, palpable dorsalis pedis, trace leg edema Respiratory- bilateral clear to auscultation, no wheeze, no rhonchi Abdomen- bowel sounds present, soft, non tender Musculoskeletal- able to move all 4 extremities, left arm and leg weakness Neurological- left hemiplegia, essential tremors, head bobbing  Skin- warm and dry, multiple tatoo  Psychiatry- alert and oriented    Labs reviewed: Basic Metabolic Panel:  Recent Labs  04/18/14 1539 07/05/14 2216 07/05/14 2227 07/08/14 1020  NA 134* 137 138 137  K 4.1 3.9 4.6 3.8  CL 100 104 103 102  CO2 31 24  --  27  GLUCOSE 75 108* 113* 128*  BUN 32* 25* 39* 23*  CREATININE 1.59* 1.30* 1.40* 1.52*  CALCIUM 9.1 8.7*  --  8.6*   Liver Function Tests:  Recent Labs  04/18/14 1539 07/05/14 2216 07/08/14 1020  AST 15 23 18   ALT 7 9* 9*  ALKPHOS 79 58 58  BILITOT 0.5 0.5 0.8  PROT 6.6 6.0* 6.6  ALBUMIN 3.3* 2.9* 2.7*   No results for input(s): LIPASE, AMYLASE in the last 8760 hours. No results for input(s): AMMONIA in the last 8760 hours. CBC:  Recent Labs  03/29/14 1248 05/24/14 1309 07/05/14 2216 07/05/14 2227  WBC 7.1 7.6 9.8  --   NEUTROABS 5.2 5.6 7.8*  --   HGB 9.8* 9.5* 10.4* 14.3  HCT 32.0* 30.7* 32.7* 42.0  MCV 78.1* 76.8* 76.9*  --   PLT 244 272 265  --     Radiological Exams:  07/06/14 mri brain without contrast IMPRESSION: 1. Punctate 5 mm acute ischemic infarct within the right temporal lobe. No associated hemorrhage or mass effect. 2. No other acute intracranial process. 3. Remote left temporoccipital infarct with additional small remote lacunar infarcts within the bilateral basal ganglia and pons. 4. Advanced cerebral atrophy with moderate chronic microvascular ischemic disease.   07/06/14  EEG Impression: This predominantly drowsy and asleep EEG is normal.    07/06/14 echocardiogram - Left ventricle: The  cavity size was normal. Wall thickness was   normal. Systolic function was normal. The estimated ejection   fraction was in the range of 55% to 60%. Wall motion was normal;   there were no regional wall motion abnormalities. Doppler   parameters are consistent with abnormal left ventricular   relaxation (grade 1 diastolic dysfunction). - Mitral valve: Moderately calcified annulus. Mildly thickened   leaflets . - No cardiac source of emboli was indentified.   07/05/14 ct head without contrast IMPRESSION: 1. No acute intracranial pathology seen to explain the patient's symptoms. 2. Moderate cortical volume loss and scattered small vessel ischemic microangiopathy. 3. Large chronic infarct at the left occipital and temporal  lobes, with associated encephalomalacia. 4. Chronic lacunar infarcts at the basal ganglia bilaterally. These results were called by telephone at the time of interpretation on 07/05/2014 at 10:08 pm to Dr. Janann Colonel, who verbally acknowledged these results.      Assessment/Plan  Physical deconditioning Will have her work with physical therapy and occupational therapy team to help with gait training and muscle strengthening exercises.fall precautions. Skin care. Encourage to be out of bed.   Acute CVA Stable, left sided weakness, continue plavix 75 mg daily, continue to work with therapy team to restore function. Will need assistance with feeding. Follow up with neurology. Continue norco prn for pain  Dysphagia Will need mechanical soft diet with thin liquid, to be followed by SLP team  Anemia of chronic disease Last hb 10.4, continue ferrous sulfate supplement, monitor h&h, continue vitamin c  Hypertension Elevated SBP. Monitor bp. continue indapamide 1.25 mg daily and metoprolol 50 mg daily for now  Hypothyroidism Continue synthroid 88 mcg dailyDiabetes mellitus hemoglobin A1c 5.3, on SSI novolog with meals, monitor cbg  ckd stage 3 Monitor renal  function  Essential tremor continue Requip 3 mg daily   Goals of care: short term rehabilitation   Labs/tests ordered: cbc, cmp  Family/ staff Communication: reviewed care plan with patient and nursing supervisor    Blanchie Serve, MD  [facility] [phone] (Monday-Friday 8 am - 5 pm) [phone] (afterhours)

## 2014-07-12 ENCOUNTER — Telehealth: Payer: Self-pay

## 2014-07-12 NOTE — Telephone Encounter (Signed)
Called the pt's daughter to try and get her an appt for her low blood sugar.Please schedule appt if she calls for this week or early next week.

## 2014-07-12 NOTE — Telephone Encounter (Signed)
[place] and spoke with the floor nurse and also the scheduler to confirm pt coming to appt on Monday

## 2014-07-12 NOTE — Telephone Encounter (Signed)
Pt had a stroke on 07/05/14 and is currently in [facility] for rehab, she is doing a bit better. I spoke with her daughter who did make an appt for the patient on Monday but will need to check on her mother and with [place] to be sure this is ok.

## 2014-07-16 ENCOUNTER — Ambulatory Visit: Payer: Medicare Other | Admitting: Endocrinology

## 2014-07-16 DIAGNOSIS — Z0289 Encounter for other administrative examinations: Secondary | ICD-10-CM

## 2014-07-18 ENCOUNTER — Other Ambulatory Visit: Payer: Medicare Other

## 2014-07-18 ENCOUNTER — Ambulatory Visit: Payer: Medicare Other

## 2014-07-19 ENCOUNTER — Ambulatory Visit: Payer: Medicare Other | Admitting: Endocrinology

## 2014-07-19 ENCOUNTER — Other Ambulatory Visit: Payer: Medicare Other

## 2014-07-19 ENCOUNTER — Ambulatory Visit: Payer: Medicare Other

## 2014-07-27 ENCOUNTER — Ambulatory Visit (INDEPENDENT_AMBULATORY_CARE_PROVIDER_SITE_OTHER): Payer: Medicare Other | Admitting: Endocrinology

## 2014-07-27 ENCOUNTER — Encounter: Payer: Self-pay | Admitting: Endocrinology

## 2014-07-27 VITALS — BP 118/62 | HR 50 | Temp 97.7°F | Resp 16

## 2014-07-27 DIAGNOSIS — E1165 Type 2 diabetes mellitus with hyperglycemia: Secondary | ICD-10-CM | POA: Diagnosis not present

## 2014-07-27 DIAGNOSIS — E038 Other specified hypothyroidism: Secondary | ICD-10-CM | POA: Diagnosis not present

## 2014-07-27 DIAGNOSIS — IMO0002 Reserved for concepts with insufficient information to code with codable children: Secondary | ICD-10-CM

## 2014-07-27 DIAGNOSIS — E063 Autoimmune thyroiditis: Secondary | ICD-10-CM

## 2014-07-27 DIAGNOSIS — I6330 Cerebral infarction due to thrombosis of unspecified cerebral artery: Secondary | ICD-10-CM

## 2014-07-27 DIAGNOSIS — N182 Chronic kidney disease, stage 2 (mild): Secondary | ICD-10-CM | POA: Diagnosis not present

## 2014-07-27 NOTE — Progress Notes (Signed)
Patient ID: [name], female   DOB: 01-28-1930, 81 y.o.   MRN: [id number]   Reason for Appointment:  follow-up   History of Present Illness   Diagnosis: Type 2 DIABETES MELITUS, long-standing  She has been on insulin for several years and most of the time has been on NPH or premixed  insulin for convenience and simplicity In 0865 because of hypoglycemia overnight her evening insulin was stopped Probably because of decreased intake and weight loss her insulin requirement has been progressively less  Recent history:    She had an episode of severe hypoglycemia requiring hospitalization admission when she had her recent stroke also Since her discharge to the nursing home she has been taking only as needed Novolog insulin at mealtimes for blood sugars over 129 she is generally taking 1-3 units based on her blood sugar levels  Current blood sugar patterns and issues:  Her fasting blood sugars are only mildly increased  Blood sugars may be periodically higher at lunchtime although not consistently  She has some readings at suppertime that are over 200  No blood sugars are being checked after supper She thinks she is eating fairly normally  Unable to check her weight since she is in a wheelchair today  Hypoglycemia: None recently Insulin regimen: Novolog 1-9 units at mealtimes based on blood sugar level           Monitors blood glucose:  2.5 times a day.    Glucometer: One Touch.          Blood Glucose reading analysis from nursing home record  Mean values apply above for all meters except median for One Touch  PRE-MEAL Fasting Lunch Dinner Bedtime Overall  Glucose range:  154-171   146-229   168-221     Mean/median:         Meals:  2-3 meals per day       Wt Readings from Last 3 Encounters:  07/10/14 177 lb (80.287 kg)  07/06/14 177 lb (80.287 kg)  04/18/14 181 lb (82.101 kg)   Lab Results  Component Value Date   HGBA1C 5.4 07/05/2014   HGBA1C 5.1 04/18/2014   HGBA1C 6.0 12/21/2013   Lab Results  Component Value Date   MICROALBUR 5.1* 04/18/2014   LDLCALC 55 07/07/2014   CREATININE 1.52* 07/08/2014        Medication List       This list is accurate as of: 07/27/14  3:35 PM.  Always use your most recent med list.               CALCIUM 600 600 MG Tabs tablet  Generic drug:  calcium carbonate  Take 600 mg by mouth 2 (two) times daily with a meal.     clopidogrel 75 MG tablet  Commonly known as:  PLAVIX  Take 1 tablet (75 mg total) by mouth daily.     cyanocobalamin 1000 MCG tablet  Take 1 tablet (1,000 mcg total) by mouth daily.     ferrous sulfate 325 (65 FE) MG tablet  Take 325 mg by mouth 2 (two) times daily with a meal.     HYDROcodone-acetaminophen 10-325 MG per tablet  Commonly known as:  NORCO  Take 1 tablet by mouth every 6 (six) hours as needed for moderate pain or severe pain.     indapamide 1.25 MG tablet  Commonly known as:  LOZOL  TAKE ONE TABLET BY MOUTH IN THE MORNING     insulin aspart 100 UNIT/ML injection  Commonly known as:  novoLOG  Inject 0-9 Units into the skin 3 (three) times daily with meals.     INSULIN SYRINGE 1CC/29G 29G X 1/2" 1 ML Misc  by Does not apply route.     levothyroxine 88 MCG tablet  Commonly known as:  SYNTHROID, LEVOTHROID  TAKE ONE TABLET BY MOUTH ONCE DAILY     metoprolol succinate 50 MG 24 hr tablet  Commonly known as:  TOPROL-XL  Take 50 mg by mouth daily.     rOPINIRole 3 MG tablet  Commonly known as:  REQUIP  Take 3 mg by mouth at bedtime.     vitamin C 1000 MG tablet  Take 1,000 mg by mouth daily.     Vitamin D 2000 UNITS tablet  Take 2,000 Units by mouth daily.        Allergies:  Allergies  Allergen Reactions  . Aspirin Hives  . Penicillins Hives    Past Medical History  Diagnosis Date  . Hemorrhoid   . Anemia   . Arthritis   . Stroke     Past Surgical History  Procedure Laterality Date  . Breast lumpectomy      LEFT  . Abdominal  hysterectomy      PARTIAL  . Mastectomy  05/2008    Left    No family history on file.  Social History:  reports that she has never smoked. She does not have any smokeless tobacco history on file. She reports that she does not drink alcohol. Her drug history is not on file.  Review of Systems:  HYPERTENSION: This is long-standing and previously was on indapamide and losartan Since her hospital discharge she has only been on Toprol-XL 50 mg daily  HYPOTHYROIDISM: She has had long-standing hypothyroidism   Her dose had not been changed on her previous visit but recently an outside lab indicates TSH of 7.3 in June and her PCP reportedly has ordered 100 g of levothyroxine but this is not reflected on her medication list from the nursing home   Lab Results  Component Value Date   TSH 4.26 04/18/2014   TSH 1.19 09/21/2013   She has had chronic bilateral leg weakness and is in a wheelchair  No history of hypercholesterolemia   Lab Results  Component Value Date   CHOL 110 07/07/2014   HDL 37* 07/07/2014   LDLCALC 55 07/07/2014   TRIG 89 07/07/2014   CHOLHDL 3.0 07/07/2014    History of breast cancer, followed by oncologist Also followed for anemia by oncologist and is going to be seen every 2 months now    Foot exam in 6/15 showed normal monofilament sensation, absent pedal pulses, no deformities or calluses    She has anemia followed by hematologist     Examination:   BP 118/62 mmHg  Pulse 50  Temp(Src) 97.7 F (36.5 C)  Resp 16  Ht   Wt   SpO2 96%  There is no weight on file to calculate BMI.    Exam not indicated  ASSESSMENT/ PLAN:   Diabetes type 2:   She apparently had an episode of hypoglycemia when she was admitted for her stroke Previously was on insulin once a day with fairly stable control Since her blood sugars are tending to be higher over 200 she will not benefit from taking as needed Novolog insulin which she is being given at this time at the  nursing home For simplicity will try her on Prandin before meals to see if  she will have improved control with this Otherwise may need to start her on low-dose basal bolus insulin especially since her fasting readings are persistently mildly high A1c tends to be falsely low and her case  Hypertension: Her blood pressure is low normal and also her pulse rate is relatively slow. Will have her reduce her Toprol to 25 mg and have her being monitored at the nursing home also  HYPOTHYROIDISM TSH was slightly high recently.  Not clear of her dose has been increased by nursing home to 100 g as ordered by her PCP Thyroid level to be checked again for her hypothyroidism in another 6 weeks   RENAL insufficiency: Her creatinine was last at 1.5.  This may improve with avoiding her low normal blood pressure readings  Gilford Lardizabal 07/27/2014, 3:35 PM

## 2014-07-27 NOTE — Patient Instructions (Addendum)
Stop insulin  Start PRANDIN 0.5 mg before each meal.  May hold the medication if refusing to eat.  Change Toprol-XL to 25 mg daily  Do not give iron or calcium with breakfast and give the first dose at lunchtime  FAX  the blood sugar readings to my office in one week at [phone]

## 2014-08-02 ENCOUNTER — Telehealth: Payer: Self-pay | Admitting: Nurse Practitioner

## 2014-08-02 NOTE — Telephone Encounter (Signed)
Per Clamensia McCoy-Collins, patient's driver Chipper Herb called to ask if patient needs to continue coming to [facility] for lab and MD visits.  He states she is in a nursing home and missed her 07/18/14 apt. Next lab and injection scheduled for 8/25, then 9/8 lab and MD visit. Will inform Dr. Marko Plume and f/u with Mr. Oletta Lamas.  Rn called [facility] primary contact number to check on her status, no answer. Non-descriptive msg left to call [phone]. RN will f/u.

## 2014-08-03 ENCOUNTER — Telehealth: Payer: Self-pay | Admitting: Nurse Practitioner

## 2014-08-03 ENCOUNTER — Inpatient Hospital Stay (HOSPITAL_COMMUNITY)
Admission: EM | Admit: 2014-08-03 | Discharge: 2014-08-05 | DRG: 812 | Disposition: A | Discharge status: Skilled Nursing Facility | Payer: Medicare Other | Attending: Internal Medicine | Admitting: Internal Medicine

## 2014-08-03 DIAGNOSIS — I10 Essential (primary) hypertension: Secondary | ICD-10-CM | POA: Diagnosis present

## 2014-08-03 DIAGNOSIS — Z880 Allergy status to penicillin: Secondary | ICD-10-CM

## 2014-08-03 DIAGNOSIS — E1122 Type 2 diabetes mellitus with diabetic chronic kidney disease: Secondary | ICD-10-CM | POA: Diagnosis present

## 2014-08-03 DIAGNOSIS — Z886 Allergy status to analgesic agent status: Secondary | ICD-10-CM

## 2014-08-03 DIAGNOSIS — E876 Hypokalemia: Secondary | ICD-10-CM | POA: Diagnosis present

## 2014-08-03 DIAGNOSIS — Z8673 Personal history of transient ischemic attack (TIA), and cerebral infarction without residual deficits: Secondary | ICD-10-CM | POA: Diagnosis not present

## 2014-08-03 DIAGNOSIS — E119 Type 2 diabetes mellitus without complications: Secondary | ICD-10-CM | POA: Diagnosis not present

## 2014-08-03 DIAGNOSIS — Z794 Long term (current) use of insulin: Secondary | ICD-10-CM | POA: Diagnosis not present

## 2014-08-03 DIAGNOSIS — E663 Overweight: Secondary | ICD-10-CM | POA: Diagnosis present

## 2014-08-03 DIAGNOSIS — D649 Anemia, unspecified: Secondary | ICD-10-CM | POA: Diagnosis present

## 2014-08-03 DIAGNOSIS — F0390 Unspecified dementia without behavioral disturbance: Secondary | ICD-10-CM | POA: Diagnosis present

## 2014-08-03 DIAGNOSIS — D631 Anemia in chronic kidney disease: Secondary | ICD-10-CM | POA: Diagnosis present

## 2014-08-03 DIAGNOSIS — F4489 Other dissociative and conversion disorders: Secondary | ICD-10-CM | POA: Diagnosis not present

## 2014-08-03 DIAGNOSIS — R7989 Other specified abnormal findings of blood chemistry: Secondary | ICD-10-CM | POA: Diagnosis not present

## 2014-08-03 DIAGNOSIS — N183 Chronic kidney disease, stage 3 unspecified: Secondary | ICD-10-CM | POA: Diagnosis present

## 2014-08-03 DIAGNOSIS — M6281 Muscle weakness (generalized): Secondary | ICD-10-CM | POA: Diagnosis not present

## 2014-08-03 DIAGNOSIS — Z79891 Long term (current) use of opiate analgesic: Secondary | ICD-10-CM

## 2014-08-03 DIAGNOSIS — E039 Hypothyroidism, unspecified: Secondary | ICD-10-CM | POA: Diagnosis present

## 2014-08-03 DIAGNOSIS — C50912 Malignant neoplasm of unspecified site of left female breast: Secondary | ICD-10-CM | POA: Diagnosis not present

## 2014-08-03 DIAGNOSIS — I639 Cerebral infarction, unspecified: Secondary | ICD-10-CM | POA: Diagnosis not present

## 2014-08-03 DIAGNOSIS — N189 Chronic kidney disease, unspecified: Secondary | ICD-10-CM | POA: Diagnosis not present

## 2014-08-03 DIAGNOSIS — Z79899 Other long term (current) drug therapy: Secondary | ICD-10-CM | POA: Diagnosis not present

## 2014-08-03 DIAGNOSIS — I69391 Dysphagia following cerebral infarction: Secondary | ICD-10-CM | POA: Diagnosis not present

## 2014-08-03 DIAGNOSIS — D500 Iron deficiency anemia secondary to blood loss (chronic): Principal | ICD-10-CM | POA: Diagnosis present

## 2014-08-03 DIAGNOSIS — E309 Disorder of puberty, unspecified: Secondary | ICD-10-CM | POA: Diagnosis not present

## 2014-08-03 DIAGNOSIS — M1990 Unspecified osteoarthritis, unspecified site: Secondary | ICD-10-CM | POA: Diagnosis present

## 2014-08-03 DIAGNOSIS — I6330 Cerebral infarction due to thrombosis of unspecified cerebral artery: Secondary | ICD-10-CM | POA: Diagnosis not present

## 2014-08-03 DIAGNOSIS — I6789 Other cerebrovascular disease: Secondary | ICD-10-CM | POA: Diagnosis not present

## 2014-08-03 DIAGNOSIS — D509 Iron deficiency anemia, unspecified: Secondary | ICD-10-CM | POA: Diagnosis not present

## 2014-08-03 DIAGNOSIS — K649 Unspecified hemorrhoids: Secondary | ICD-10-CM | POA: Diagnosis present

## 2014-08-03 DIAGNOSIS — R278 Other lack of coordination: Secondary | ICD-10-CM | POA: Diagnosis not present

## 2014-08-03 DIAGNOSIS — Z9012 Acquired absence of left breast and nipple: Secondary | ICD-10-CM | POA: Diagnosis present

## 2014-08-03 DIAGNOSIS — Z7902 Long term (current) use of antithrombotics/antiplatelets: Secondary | ICD-10-CM | POA: Diagnosis not present

## 2014-08-03 DIAGNOSIS — Z6828 Body mass index (BMI) 28.0-28.9, adult: Secondary | ICD-10-CM

## 2014-08-03 DIAGNOSIS — I129 Hypertensive chronic kidney disease with stage 1 through stage 4 chronic kidney disease, or unspecified chronic kidney disease: Secondary | ICD-10-CM | POA: Diagnosis present

## 2014-08-03 DIAGNOSIS — C50919 Malignant neoplasm of unspecified site of unspecified female breast: Secondary | ICD-10-CM

## 2014-08-03 DIAGNOSIS — R41841 Cognitive communication deficit: Secondary | ICD-10-CM | POA: Diagnosis not present

## 2014-08-03 DIAGNOSIS — Z853 Personal history of malignant neoplasm of breast: Secondary | ICD-10-CM | POA: Diagnosis not present

## 2014-08-03 DIAGNOSIS — Z90710 Acquired absence of both cervix and uterus: Secondary | ICD-10-CM | POA: Diagnosis not present

## 2014-08-03 DIAGNOSIS — I69820 Aphasia following other cerebrovascular disease: Secondary | ICD-10-CM | POA: Diagnosis not present

## 2014-08-03 LAB — CBC
HCT: 23 % — ABNORMAL LOW (ref 36.0–46.0)
Hemoglobin: 7.4 g/dL — ABNORMAL LOW (ref 12.0–15.0)
MCH: 24.2 pg — ABNORMAL LOW (ref 26.0–34.0)
MCHC: 32.2 g/dL (ref 30.0–36.0)
MCV: 75.2 fL — ABNORMAL LOW (ref 78.0–100.0)
Platelets: 294 10*3/uL (ref 150–400)
RBC: 3.06 MIL/uL — AB (ref 3.87–5.11)
RDW: 14.5 % (ref 11.5–15.5)
WBC: 9.8 10*3/uL (ref 4.0–10.5)

## 2014-08-03 LAB — FOLATE: FOLATE: 9 ng/mL (ref >5.9)

## 2014-08-03 LAB — BASIC METABOLIC PANEL
ANION GAP: 8 (ref 5–15)
BUN: 38 mg/dL — ABNORMAL HIGH (ref 6–20)
CALCIUM: 9.4 mg/dL (ref 8.9–10.3)
CHLORIDE: 99 mmol/L — AB (ref 101–111)
CO2: 30 mmol/L (ref 22–32)
CREATININE: 1.35 mg/dL — AB (ref 0.44–1.00)
GFR calc Af Amer: 41 mL/min — ABNORMAL LOW (ref >60)
GFR calc non Af Amer: 36 mL/min — ABNORMAL LOW (ref >60)
Glucose, Bld: 164 mg/dL — ABNORMAL HIGH (ref 65–99)
Potassium: 3.5 mmol/L (ref 3.5–5.1)
Sodium: 137 mmol/L (ref 135–145)

## 2014-08-03 LAB — CBC WITH DIFFERENTIAL/PLATELET
BASOS ABS: 0 10*3/uL (ref 0.0–0.1)
Basophils Relative: 0 % (ref 0–1)
EOS ABS: 0.1 10*3/uL (ref 0.0–0.7)
Eosinophils Relative: 1 % (ref 0–5)
HCT: 23.6 % — ABNORMAL LOW (ref 36.0–46.0)
HEMOGLOBIN: 7.6 g/dL — AB (ref 12.0–15.0)
LYMPHS PCT: 10 % — AB (ref 12–46)
Lymphs Abs: 1 10*3/uL (ref 0.7–4.0)
MCH: 24 pg — AB (ref 26.0–34.0)
MCHC: 32.2 g/dL (ref 30.0–36.0)
MCV: 74.4 fL — ABNORMAL LOW (ref 78.0–100.0)
MONOS PCT: 8 % (ref 3–12)
Monocytes Absolute: 0.8 10*3/uL (ref 0.1–1.0)
NEUTROS ABS: 8.1 10*3/uL — AB (ref 1.7–7.7)
NEUTROS PCT: 81 % — AB (ref 43–77)
Platelets: 266 10*3/uL (ref 150–400)
RBC: 3.17 MIL/uL — ABNORMAL LOW (ref 3.87–5.11)
RDW: 14.4 % (ref 11.5–15.5)
WBC: 10 10*3/uL (ref 4.0–10.5)

## 2014-08-03 LAB — VITAMIN B12: Vitamin B-12: 1417 pg/mL — ABNORMAL HIGH (ref 180–914)

## 2014-08-03 LAB — IRON AND TIBC
IRON: 17 ug/dL — AB (ref 28–170)
SATURATION RATIOS: 13 % (ref 10.4–31.8)
TIBC: 126 ug/dL — ABNORMAL LOW (ref 250–450)
UIBC: 109 ug/dL

## 2014-08-03 LAB — POC OCCULT BLOOD, ED: Fecal Occult Bld: NEGATIVE

## 2014-08-03 LAB — RETICULOCYTES
RBC.: 3.18 MIL/uL — ABNORMAL LOW (ref 3.87–5.11)
RETIC CT PCT: 1.6 % (ref 0.4–3.1)
Retic Count, Absolute: 50.9 10*3/uL (ref 19.0–186.0)

## 2014-08-03 LAB — I-STAT CHEM 8, ED
BUN: 36 mg/dL — ABNORMAL HIGH (ref 6–20)
CREATININE: 1.3 mg/dL — AB (ref 0.44–1.00)
Calcium, Ion: 1.26 mmol/L (ref 1.13–1.30)
Chloride: 96 mmol/L — ABNORMAL LOW (ref 101–111)
GLUCOSE: 162 mg/dL — AB (ref 65–99)
HCT: 25 % — ABNORMAL LOW (ref 36.0–46.0)
HEMOGLOBIN: 8.5 g/dL — AB (ref 12.0–15.0)
Potassium: 3.4 mmol/L — ABNORMAL LOW (ref 3.5–5.1)
SODIUM: 136 mmol/L (ref 135–145)
TCO2: 27 mmol/L (ref 0–100)

## 2014-08-03 LAB — FERRITIN: FERRITIN: 1396 ng/mL — AB (ref 11–307)

## 2014-08-03 LAB — PROTIME-INR
INR: 1.23 (ref 0.00–1.49)
Prothrombin Time: 15.7 s — ABNORMAL HIGH (ref 11.6–15.2)

## 2014-08-03 LAB — APTT: aPTT: 28 s (ref 24–37)

## 2014-08-03 LAB — ABO/RH: ABO/RH(D): O NEG

## 2014-08-03 LAB — PREPARE RBC (CROSSMATCH)

## 2014-08-03 LAB — GLUCOSE, CAPILLARY: Glucose-Capillary: 145 mg/dL — ABNORMAL HIGH (ref 65–99)

## 2014-08-03 MED ORDER — CALCIUM CARBONATE 1250 (500 CA) MG PO TABS
1.0000 | ORAL_TABLET | Freq: Two times a day (BID) | ORAL | Status: DC
  Administered 2014-08-04 – 2014-08-05 (×4): 500 mg via ORAL
  Filled 2014-08-03 (×5): qty 1

## 2014-08-03 MED ORDER — ALUM & MAG HYDROXIDE-SIMETH 200-200-20 MG/5ML PO SUSP: 30.0000 mL | Freq: Four times a day (QID) | ORAL | Status: DC | PRN

## 2014-08-03 MED ORDER — ROPINIROLE HCL 1 MG PO TABS
3.0000 mg | ORAL_TABLET | Freq: Every day | ORAL | Status: DC
  Administered 2014-08-03 – 2014-08-04 (×2): 3 mg via ORAL
  Filled 2014-08-03 (×3): qty 3

## 2014-08-03 MED ORDER — HEPARIN SODIUM (PORCINE) 5000 UNIT/ML IJ SOLN
5000.0000 [IU] | Freq: Three times a day (TID) | INTRAMUSCULAR | Status: DC
  Administered 2014-08-03 – 2014-08-04 (×3): 5000 [IU] via SUBCUTANEOUS
  Filled 2014-08-03 (×4): qty 1

## 2014-08-03 MED ORDER — VITAMIN B-12 1000 MCG PO TABS
1000.0000 ug | ORAL_TABLET | Freq: Every day | ORAL | Status: DC
  Administered 2014-08-04 – 2014-08-05 (×2): 1000 ug via ORAL
  Filled 2014-08-03 (×2): qty 1

## 2014-08-03 MED ORDER — ENSURE ENLIVE PO LIQD
237.0000 mL | Freq: Two times a day (BID) | ORAL | Status: DC
  Administered 2014-08-04: 237 mL via ORAL

## 2014-08-03 MED ORDER — SODIUM CHLORIDE 0.9 % IV SOLN
Freq: Once | INTRAVENOUS | Status: AC
  Administered 2014-08-03: 23:00:00 via INTRAVENOUS

## 2014-08-03 MED ORDER — FERROUS SULFATE 325 (65 FE) MG PO TABS
325.0000 mg | ORAL_TABLET | Freq: Two times a day (BID) | ORAL | Status: DC
  Administered 2014-08-04 – 2014-08-05 (×4): 325 mg via ORAL
  Filled 2014-08-03 (×6): qty 1

## 2014-08-03 MED ORDER — VITAMIN D 1000 UNITS PO TABS
2000.0000 [IU] | ORAL_TABLET | Freq: Every day | ORAL | Status: DC
  Administered 2014-08-03 – 2014-08-05 (×4): 2000 [IU] via ORAL
  Filled 2014-08-03 (×4): qty 2

## 2014-08-03 MED ORDER — INSULIN ASPART 100 UNIT/ML ~~LOC~~ SOLN
0.0000 [IU] | Freq: Three times a day (TID) | SUBCUTANEOUS | Status: DC
  Administered 2014-08-04: 1 [IU] via SUBCUTANEOUS
  Administered 2014-08-04: 3 [IU] via SUBCUTANEOUS
  Administered 2014-08-04: 2 [IU] via SUBCUTANEOUS
  Administered 2014-08-05: 1 [IU] via SUBCUTANEOUS
  Administered 2014-08-05 (×2): 2 [IU] via SUBCUTANEOUS

## 2014-08-03 MED ORDER — POTASSIUM CHLORIDE 20 MEQ/15ML (10%) PO SOLN
20.0000 meq | Freq: Once | ORAL | Status: AC
  Administered 2014-08-03: 20 meq via ORAL
  Filled 2014-08-03: qty 15

## 2014-08-03 MED ORDER — CALCIUM CARBONATE 600 MG PO TABS
600.0000 mg | ORAL_TABLET | Freq: Two times a day (BID) | ORAL | Status: DC
  Filled 2014-08-03: qty 1

## 2014-08-03 MED ORDER — HYDRALAZINE HCL 20 MG/ML IJ SOLN: 5.0000 mg | INTRAMUSCULAR | Status: DC | PRN

## 2014-08-03 MED ORDER — ACETAMINOPHEN 325 MG PO TABS: 650.0000 mg | ORAL_TABLET | Freq: Four times a day (QID) | ORAL | Status: DC | PRN

## 2014-08-03 MED ORDER — ONDANSETRON HCL 4 MG/2ML IJ SOLN: 4.0000 mg | Freq: Four times a day (QID) | INTRAMUSCULAR | Status: DC | PRN

## 2014-08-03 MED ORDER — ONDANSETRON HCL 4 MG PO TABS: 4.0000 mg | ORAL_TABLET | Freq: Four times a day (QID) | ORAL | Status: DC | PRN

## 2014-08-03 MED ORDER — ACETAMINOPHEN 650 MG RE SUPP: 650.0000 mg | Freq: Four times a day (QID) | RECTAL | Status: DC | PRN

## 2014-08-03 MED ORDER — LORAZEPAM 0.5 MG PO TABS
0.5000 mg | ORAL_TABLET | Freq: Three times a day (TID) | ORAL | Status: DC | PRN
  Administered 2014-08-03 – 2014-08-05 (×3): 0.5 mg via ORAL
  Filled 2014-08-03 (×3): qty 1

## 2014-08-03 MED ORDER — LEVOTHYROXINE SODIUM 100 MCG PO TABS
100.0000 ug | ORAL_TABLET | Freq: Every day | ORAL | Status: DC
  Administered 2014-08-04 – 2014-08-05 (×2): 100 ug via ORAL
  Filled 2014-08-03 (×3): qty 1

## 2014-08-03 MED ORDER — CLOPIDOGREL BISULFATE 75 MG PO TABS
75.0000 mg | ORAL_TABLET | Freq: Every day | ORAL | Status: DC
  Administered 2014-08-03 – 2014-08-05 (×3): 75 mg via ORAL
  Filled 2014-08-03 (×3): qty 1

## 2014-08-03 MED ORDER — SODIUM CHLORIDE 0.9 % IV SOLN
INTRAVENOUS | Status: DC
  Administered 2014-08-04: 04:00:00 via INTRAVENOUS

## 2014-08-03 MED ORDER — VITAMIN C 500 MG PO TABS
1000.0000 mg | ORAL_TABLET | Freq: Every day | ORAL | Status: DC
  Administered 2014-08-04 – 2014-08-05 (×2): 1000 mg via ORAL
  Filled 2014-08-03 (×2): qty 2

## 2014-08-03 MED ORDER — SODIUM CHLORIDE 0.9 % IJ SOLN
3.0000 mL | Freq: Two times a day (BID) | INTRAMUSCULAR | Status: DC
  Administered 2014-08-03 – 2014-08-05 (×4): 3 mL via INTRAVENOUS

## 2014-08-03 MED ORDER — HYDROCODONE-ACETAMINOPHEN 10-325 MG PO TABS
1.0000 | ORAL_TABLET | Freq: Four times a day (QID) | ORAL | Status: DC | PRN
  Administered 2014-08-05: 1 via ORAL
  Filled 2014-08-03: qty 1

## 2014-08-03 NOTE — ED Notes (Signed)
Attempted to call daughter in order to get consent for blood transfusion. No answer. Left call back number.

## 2014-08-03 NOTE — ED Provider Notes (Signed)
CSN: [id number]     Arrival date & time 08/03/14  1711 History   First MD Initiated Contact with Patient 08/03/14 1719     Chief Complaint  Patient presents with  . Abnormal Lab     (Consider location/radiation/quality/duration/timing/severity/associated sxs/prior Treatment) HPI Comments: Patient with a history of diabetes mellitus recent CVA and chronic anemia due to chronic kidney disease presents with possible anemia. She lives at [facility]. She had blood work done today which showed a hemoglobin of 6.5. She does have chronic anemia with a baseline hemoglobin around 9-10. She had a hemoglobin checked a few days ago which was 7.6. She currently denies any dizziness shortness of breath or chest pain. She denies any denies weakness. However her history is limited secondary to probable dementia. She reportedly is in a memory care unit at [facility].   Past Medical History  Diagnosis Date  . Hemorrhoid   . Anemia   . Arthritis   . Stroke    Past Surgical History  Procedure Laterality Date  . Breast lumpectomy      LEFT  . Abdominal hysterectomy      PARTIAL  . Mastectomy  05/2008    Left   No family history on file. History  Substance Use Topics  . Smoking status: Never Smoker   . Smokeless tobacco: Not on file  . Alcohol Use: No   OB History    No data available     Review of Systems  Constitutional: Negative for fever, chills, diaphoresis and fatigue.  HENT: Negative for congestion, rhinorrhea and sneezing.   Eyes: Negative.   Respiratory: Negative for cough, chest tightness and shortness of breath.   Cardiovascular: Negative for chest pain and leg swelling.  Gastrointestinal: Negative for nausea, vomiting, abdominal pain, diarrhea and blood in stool.  Genitourinary: Negative for frequency, hematuria, flank pain and difficulty urinating.  Musculoskeletal: Negative for back pain and arthralgias.  Skin: Negative for rash.  Neurological: Negative for dizziness,  speech difficulty, weakness, numbness and headaches.      Allergies  Aspirin and Penicillins  Home Medications   Prior to Admission medications   Medication Sig Start Date End Date Taking? Authorizing Provider  Ascorbic Acid (VITAMIN C) 1000 MG tablet Take 1,000 mg by mouth daily.   Yes Historical Provider, MD  calcium carbonate (CALCIUM 600) 600 MG TABS tablet Take 600 mg by mouth 2 (two) times daily with a meal.   Yes Historical Provider, MD  Cholecalciferol (VITAMIN D) 2000 UNITS tablet Take 2,000 Units by mouth daily.   Yes Historical Provider, MD  clopidogrel (PLAVIX) 75 MG tablet Take 1 tablet (75 mg total) by mouth daily. 07/09/14  Yes Geradine Girt, DO  ferrous sulfate 325 (65 FE) MG tablet Take 325 mg by mouth 2 (two) times daily with a meal.   Yes Historical Provider, MD  HYDROcodone-acetaminophen (NORCO) 10-325 MG per tablet Take 1 tablet by mouth every 6 (six) hours as needed for moderate pain or severe pain. 07/09/14  Yes Jessica U Vann, DO  indapamide (LOZOL) 1.25 MG tablet TAKE ONE TABLET BY MOUTH IN THE MORNING 02/23/14  Yes Elayne Snare, MD  insulin lispro (HUMALOG) 100 UNIT/ML injection Inject 0-9 Units into the skin 3 (three) times daily before meals.   Yes Historical Provider, MD  levothyroxine (SYNTHROID, LEVOTHROID) 100 MCG tablet Take 100 mcg by mouth daily before breakfast.   Yes Historical Provider, MD  rOPINIRole (REQUIP) 3 MG tablet Take 3 mg by mouth at  bedtime.  10/20/10  Yes Historical Provider, MD  vitamin B-12 1000 MCG tablet Take 1 tablet (1,000 mcg total) by mouth daily. 07/09/14  Yes Geradine Girt, DO  insulin aspart (NOVOLOG) 100 UNIT/ML injection Inject 0-9 Units into the skin 3 (three) times daily with meals. Patient not taking: Reported on 08/03/2014 07/09/14   Geradine Girt, DO  levothyroxine (SYNTHROID, LEVOTHROID) 88 MCG tablet TAKE ONE TABLET BY MOUTH ONCE DAILY Patient not taking: Reported on 08/03/2014 02/23/14   Elayne Snare, MD   BP 109/64 mmHg  Pulse  66  Temp(Src) 98.8 F (37.1 C) (Oral)  Resp 18  SpO2 100% Physical Exam  Constitutional: She appears well-developed and well-nourished.  HENT:  Head: Normocephalic and atraumatic.  Eyes: Pupils are equal, round, and reactive to light.  Neck: Normal range of motion. Neck supple.  Cardiovascular: Normal rate, regular rhythm and normal heart sounds.   Pulmonary/Chest: Effort normal and breath sounds normal. No respiratory distress. She has no wheezes. She has no rales. She exhibits no tenderness.  Abdominal: Soft. Bowel sounds are normal. There is no tenderness. There is no rebound and no guarding.  Musculoskeletal: Normal range of motion. She exhibits no edema.  She has soft boots on both of her lower extremities  Lymphadenopathy:    She has no cervical adenopathy.  Neurological: She is alert.  Patient is alert and answers questions but seems to be confused at times. She has generalized weakness. She is moving all extremities. She has limited mobility to her lower extremities which is bilateral.  Skin: Skin is warm and dry. No rash noted.  Psychiatric: She has a normal mood and affect.    ED Course  Procedures (including critical care time) Labs Review Labs Reviewed  BASIC METABOLIC PANEL - Abnormal; Notable for the following:    Chloride 99 (*)    Glucose, Bld 164 (*)    BUN 38 (*)    Creatinine, Ser 1.35 (*)    GFR calc non Af Amer 36 (*)    GFR calc Af Amer 41 (*)    All other components within normal limits  CBC WITH DIFFERENTIAL/PLATELET - Abnormal; Notable for the following:    RBC 3.17 (*)    Hemoglobin 7.6 (*)    HCT 23.6 (*)    MCV 74.4 (*)    MCH 24.0 (*)    Neutrophils Relative % 81 (*)    Lymphocytes Relative 10 (*)    Neutro Abs 8.1 (*)    All other components within normal limits  I-STAT CHEM 8, ED - Abnormal; Notable for the following:    Potassium 3.4 (*)    Chloride 96 (*)    BUN 36 (*)    Creatinine, Ser 1.30 (*)    Glucose, Bld 162 (*)     Hemoglobin 8.5 (*)    HCT 25.0 (*)    All other components within normal limits  POC OCCULT BLOOD, ED  TYPE AND SCREEN  ABO/RH  PREPARE RBC (CROSSMATCH)    Imaging Review No results found.   EKG Interpretation   Date/Time:  Friday August 03 2014 17:28:20 EDT Ventricular Rate:  71 PR Interval:  191 QRS Duration: 78 QT Interval:  403 QTC Calculation: 438 R Axis:   61 Text Interpretation:  Sinus rhythm Atrial premature complex Consider left  ventricular hypertrophy Nonspecific T abnormalities, lateral leads since  last tracing no significant change Confirmed by Yacob Wilkerson  MD, Yumi Insalaco ([id number])  on 08/03/2014 5:41:21 PM  MDM   Final diagnoses:  Symptomatic anemia    Patient presents with anemia. The nursing home and reported the patient was weak although patient is currently denying any symptoms. Her hemoglobin here is 7.6 which does appear lower than baseline values. She has no ischemic changes on EKG. We will check a Hemoccult although patient has chronic anemia which per chart review is secondary to her chronic kidney disease. I spoke with Dr. Blaine Hamper who will admit the patient. She was type and cross for 2 units of packed red cells.    Malvin Johns, MD 08/03/14 [phone]

## 2014-08-03 NOTE — Telephone Encounter (Signed)
2nd attempt to reach patient per Dr. Marko Plume regarding her status at nursing facility and whether she feels well enough to travel to [facility] for further care. No answer and no message left. RN to follow up.

## 2014-08-03 NOTE — ED Notes (Signed)
Pt presents from [facility]. Pt presents with abnormal lab value Hgb 6.5. Per facility pt is baseline. Pt has a history of anemia.

## 2014-08-03 NOTE — H&P (Signed)
[facility] History and Physical  [name] YDX:[id number] DOB: 1930/08/13 DOA: 08/03/2014  Referring physician: ED physician PCP: Antony Blackbird, MD  Specialists:   Chief Complaint: abnormal lab with Hgb 6.5  HPI: [name] is a 81 y.o. female with PMH of history of hypertension, diabetes, hypothyroidism, stroke, anemia, arthritis, hemorrhoid, breast cancer, chronic kidney disease-stage III, dementia, who presents with abnormal lab with hemoglobin 6.5.  Patient has dementia and is unable to provide much history. Therefore, most of the history is obtained by discussing the case with the ED physician and the nursing staff. Patient is from SNF and was sent to ED because of hgb drop. Patient had blood work done in SNF and was found to have hemoglobin decrease from baseline 9-10 to 6.5. She had hemoglobin checked a few days ago which was 7.6. Since patient has dementia, she could not tell whether she has any symptoms, but denies seeing blood in stool. Currently, patient seems to not have chest pain, shortness of breath, abdominal pain, diarrhea, symptoms of UTI. She moves all his extremities.  In ED, patient was found to have hemoglobin 7.6, negative FOBT, dry mucus and membrane, temperature normal, notachycardia, potassium 3.4, stable renal function. Patient is admitted to inpatient for further eval and treatment.  Where does patient live?   SNF     Can patient participate in ADLs? None  Review of Systems: could not be assessed due to dementia.  Allergy:  Allergies  Allergen Reactions  . Aspirin Hives  . Penicillins Hives    Past Medical History  Diagnosis Date  . Hemorrhoid   . Anemia   . Arthritis   . Stroke     Past Surgical History  Procedure Laterality Date  . Breast lumpectomy      LEFT  . Abdominal hysterectomy      PARTIAL  . Mastectomy  05/2008    Left    Social History:  reports that she has never smoked. She does not have any smokeless tobacco  history on file. She reports that she does not drink alcohol. Her drug history is not on file.  Family History: Could be assessed due to dementia.  Prior to Admission medications   Medication Sig Start Date End Date Taking? Authorizing Provider  Ascorbic Acid (VITAMIN C) 1000 MG tablet Take 1,000 mg by mouth daily.   Yes Historical Provider, MD  calcium carbonate (CALCIUM 600) 600 MG TABS tablet Take 600 mg by mouth 2 (two) times daily with a meal.   Yes Historical Provider, MD  Cholecalciferol (VITAMIN D) 2000 UNITS tablet Take 2,000 Units by mouth daily.   Yes Historical Provider, MD  clopidogrel (PLAVIX) 75 MG tablet Take 1 tablet (75 mg total) by mouth daily. 07/09/14  Yes Geradine Girt, DO  ferrous sulfate 325 (65 FE) MG tablet Take 325 mg by mouth 2 (two) times daily with a meal.   Yes Historical Provider, MD  HYDROcodone-acetaminophen (NORCO) 10-325 MG per tablet Take 1 tablet by mouth every 6 (six) hours as needed for moderate pain or severe pain. 07/09/14  Yes Jessica U Vann, DO  indapamide (LOZOL) 1.25 MG tablet TAKE ONE TABLET BY MOUTH IN THE MORNING 02/23/14  Yes Elayne Snare, MD  insulin lispro (HUMALOG) 100 UNIT/ML injection Inject 0-9 Units into the skin 3 (three) times daily before meals.   Yes Historical Provider, MD  levothyroxine (SYNTHROID, LEVOTHROID) 100 MCG tablet Take 100 mcg by mouth daily before breakfast.   Yes Historical Provider, MD  rOPINIRole (REQUIP) 3 MG tablet Take 3 mg by mouth at bedtime.  10/20/10  Yes Historical Provider, MD  vitamin B-12 1000 MCG tablet Take 1 tablet (1,000 mcg total) by mouth daily. 07/09/14  Yes Geradine Girt, DO  insulin aspart (NOVOLOG) 100 UNIT/ML injection Inject 0-9 Units into the skin 3 (three) times daily with meals. Patient not taking: Reported on 08/03/2014 07/09/14   Geradine Girt, DO  levothyroxine (SYNTHROID, LEVOTHROID) 88 MCG tablet TAKE ONE TABLET BY MOUTH ONCE DAILY Patient not taking: Reported on 08/03/2014 02/23/14   Elayne Snare, MD     Physical Exam: Filed Vitals:   08/03/14 1719 08/03/14 1815 08/03/14 1830  BP: 152/66 152/76 109/64  Pulse: 71 68 66  Temp: 98.8 F (37.1 C)    TempSrc: Oral    Resp: 18 14 18   SpO2: 97% 100% 100%   General: Not in acute distress. Dry mucus and membrane HEENT:       Eyes: PERRL, EOMI, no scleral icterus.       ENT: No discharge from the ears and nose, no pharynx injection, no tonsillar enlargement.        Neck: No JVD, no bruit, no mass felt. Heme: No neck lymph node enlargement. Cardiac: P8/E4, RRR, 2/6 systolic murmurs, No gallops or rubs. Pulm:  No rales, wheezing, rhonchi or rubs. Abd: Soft, nondistended, nontender, no rebound pain, no organomegaly, BS present. Ext: No pitting leg edema bilaterally. 2+DP/PT pulse bilaterally. Has fine tremor in left hand Musculoskeletal: No joint deformities, No joint redness or warmth, no limitation of ROM in spin. Skin: No rashes.  Neuro: Alert, oriented X3, cranial nerves II-XII grossly intact, moves all extremities Psych: Patient is not psychotic, no suicidal or hemocidal ideation.  Labs on Admission:  Basic Metabolic Panel:  Recent Labs Lab 08/03/14 1810 08/03/14 1823  NA 137 136  K 3.5 3.4*  CL 99* 96*  CO2 30  --   GLUCOSE 164* 162*  BUN 38* 36*  CREATININE 1.35* 1.30*  CALCIUM 9.4  --    Liver Function Tests: No results for input(s): AST, ALT, ALKPHOS, BILITOT, PROT, ALBUMIN in the last 168 hours. No results for input(s): LIPASE, AMYLASE in the last 168 hours. No results for input(s): AMMONIA in the last 168 hours. CBC:  Recent Labs Lab 08/03/14 1810 08/03/14 1823  WBC 10.0  --   NEUTROABS 8.1*  --   HGB 7.6* 8.5*  HCT 23.6* 25.0*  MCV 74.4*  --   PLT 266  --    Cardiac Enzymes: No results for input(s): CKTOTAL, CKMB, CKMBINDEX, TROPONINI in the last 168 hours.  BNP (last 3 results) No results for input(s): BNP in the last 8760 hours.  ProBNP (last 3 results) No results for input(s): PROBNP in the  last 8760 hours.  CBG: No results for input(s): GLUCAP in the last 168 hours.  Radiological Exams on Admission: No results found.  EKG: Independently reviewed.  Abnormal findings: PAC  Assessment/Plan Principal Problem:   Anemia Active Problems:   Hypothyroidism   Essential hypertension   Breast cancer, left breast   Anemia in CKD (chronic kidney disease)   CKD (chronic kidney disease) stage 3, GFR 30-59 ml/min   DM2 (diabetes mellitus, type 2)   Cerebral thrombosis with cerebral infarction  Anemia: this is likely due to both iron deficiency and chronic kidney disease. Her iron level was 33 on 11/08/13. Patient is on iron supplement. Today hemoglobin dropped from baseline 9-2 to 6.5 in facility. The  repeated CBC showed Hgb 7.6, but patient is clinically dry, therefore 6.5 is more accurate.  FOBT is negative, less likely due to GIB - will admit to tele bed - ED ordered 2 units of blood.  - INR/PTT/type & screen - IVF:  NS 100 cc/h -cbc q6h -Anemia panel  Hypothyroidism: Last TSH was 4.26 on 04/18/14 -Continue home Synthroid  HTN: -hold Indapamide since patient is clinically dry -IV hydralazine when necessary  CKD (chronic kidney disease) stage 3, GFR 30-59 ml/min: stable. Baseline creatinine 1.3-1.5, her creatinine is 1.30 on admission, which is at baseline. -Follow-up renal function. BMP  DM-II: Last A1c 5.4 on 07/05/14, well controled. Patient is taking Humalog at home -SSI  Recent Stroke: stable -on plavix at home, will continue since FOBT is negative  Hypokalemia: K= 3.4 on admission. - Repleted   DVT ppx: SQ Heparin      Code Status: Full code Family Communication: None at bed side.     Disposition Plan: Admit to inpatient   Date of Service 08/03/2014    Ivor Costa [facility] Pager [phone]  If 7PM-7AM, please contact night-coverage [email or web address] Password [facility] 08/03/2014, 8:08 PM

## 2014-08-03 NOTE — ED Notes (Signed)
Daughter [name] given verbal consent over telephone to transfuse patient. Will be in facility within the hour.

## 2014-08-03 NOTE — ED Notes (Signed)
Spoke with Maudie Mercury at [facility] to find out if patient can have blood transfusion. SNF unsure.

## 2014-08-04 DIAGNOSIS — D509 Iron deficiency anemia, unspecified: Secondary | ICD-10-CM

## 2014-08-04 LAB — CBC
HCT: 26.5 % — ABNORMAL LOW (ref 36.0–46.0)
HCT: 26.8 % — ABNORMAL LOW (ref 36.0–46.0)
HEMOGLOBIN: 8.9 g/dL — AB (ref 12.0–15.0)
Hemoglobin: 8.8 g/dL — ABNORMAL LOW (ref 12.0–15.0)
MCH: 25.4 pg — ABNORMAL LOW (ref 26.0–34.0)
MCH: 24.5 pg — AB (ref 26.0–34.0)
MCHC: 33.6 g/dL (ref 30.0–36.0)
MCHC: 32.8 g/dL (ref 30.0–36.0)
MCV: 75.7 fL — ABNORMAL LOW (ref 78.0–100.0)
MCV: 74.7 fL — AB (ref 78.0–100.0)
PLATELETS: 233 10*3/uL (ref 150–400)
Platelets: 265 10*3/uL (ref 150–400)
RBC: 3.5 MIL/uL — ABNORMAL LOW (ref 3.87–5.11)
RBC: 3.59 MIL/uL — ABNORMAL LOW (ref 3.87–5.11)
RDW: 14.5 % (ref 11.5–15.5)
RDW: 14.2 % (ref 11.5–15.5)
WBC: 9.4 10*3/uL (ref 4.0–10.5)
WBC: 7.7 10*3/uL (ref 4.0–10.5)

## 2014-08-04 LAB — GLUCOSE, CAPILLARY
Glucose-Capillary: 135 mg/dL — ABNORMAL HIGH (ref 65–99)
Glucose-Capillary: 225 mg/dL — ABNORMAL HIGH (ref 65–99)
Glucose-Capillary: 164 mg/dL — ABNORMAL HIGH (ref 65–99)
Glucose-Capillary: 100 mg/dL — ABNORMAL HIGH (ref 65–99)

## 2014-08-04 MED ORDER — GLUCERNA SHAKE PO LIQD
237.0000 mL | Freq: Three times a day (TID) | ORAL | Status: DC
  Administered 2014-08-04 – 2014-08-05 (×3): 237 mL via ORAL

## 2014-08-04 MED ORDER — POTASSIUM CHLORIDE CRYS ER 20 MEQ PO TBCR
40.0000 meq | EXTENDED_RELEASE_TABLET | Freq: Once | ORAL | Status: AC
  Administered 2014-08-04: 40 meq via ORAL
  Filled 2014-08-04: qty 2

## 2014-08-04 NOTE — Progress Notes (Signed)
Patient ID: [name], female   DOB: Jun 16, 1930, 81 y.o.   MRN: [id number]  [facility] PROGRESS NOTE  Rickita Forstner OAC:[id number] DOB: 1930-07-27 DOA: 08/03/2014 PCP: Antony Blackbird, MD   Brief narrative:    81 y.o. female with PMH of history of hypertension, diabetes, hypothyroidism, stroke, anemia, arthritis, hemorrhoid, breast cancer, chronic kidney disease-stage III, dementia, who presented with abnormal lab with hemoglobin 6.5.  Patient has dementia and is unable to provide much history. Therefore, most of the history is obtained by discussing the case with the ED physician and the nursing staff. Patient is from SNF and was sent to ED because of hgb drop. Patient had blood work done in SNF and was found to have hemoglobin decrease from baseline 9-10 to 6.5. She had hemoglobin checked a few days ago which was 7.6.   In ED, patient was found to have hemoglobin 7.6, negative FOBT, dry mucus and membrane, temperature normal, notachycardia, potassium 3.4, stable renal function. Patient admitted to inpatient for further eval and treatment.  Assessment/Plan:    Acute on chronic blood loss anemia, IDA - with baseline Hg ~ 9-10 and drop to 7.6 on admission - s/p 2 U PRBC transfusion, pt looks clinically stable this AM, appropriate increase in post transfusion Hg up to 8.9 - no signs of active bleeding and  FOBT was negative - continue conservative management - pt is not candidate for an intervention given advanced dementia and poor performance status - transfuse as needed - CBC in AM  Hypothyroidism - Last TSH was 4.26 on 04/18/14 - Continue home Synthroid  HTN - reasonable inpatient control - continue Hydralazine as needed for now  CKD (chronic kidney disease) stage 3 - GFR 30-59 ml/min: stable. Baseline creatinine 1.3-1.5 - Cr 1.30 on admission, which is at baseline. - BMP in AM  DM-II with complications of CKD - Last A1c 5.4 on 07/05/14 - keep on SSI for now  Recent  Stroke - stable - on plavix at home, will continue since FOBT is negative - SLP requested   Hypokalemia - supplemented, repeat BMP in AM  Overweight  - Body mass index is 28.58 kg/(m^2).  DVT prophylaxis - stop Heparin, place on SCD's  Code Status: Full.  Family Communication:  No family at bedside Disposition Plan: SNF by Monday  IV access:  Peripheral IV  Procedures and diagnostic studies:     Mr Brain Wo Contrast 07/06/2014  Punctate 5 mm acute ischemic infarct within the right temporal lobe. No associated hemorrhage or mass effect. 2. No other acute intracranial process. 3. Remote left temporoccipital infarct with additional small remote lacunar infarcts within the bilateral basal ganglia and pons. 4. Advanced cerebral atrophy with moderate chronic microvascular ischemic disease.     Medical Consultants:  None  Other Consultants:  SLP PT  IAnti-Infectives:   None  Faye Ramsay, MD  [place] Pager [phone]  If 7PM-7AM, please contact night-coverage [email or web address] Password [facility] 08/04/2014, 5:32 PM   LOS: 1 day   HPI/Subjective: No events overnight.   Objective: Filed Vitals:   08/04/14 0200 08/04/14 0420 08/04/14 0925 08/04/14 1217  BP: 112/52 134/58 151/64   Pulse: 60 69 66   Temp: 98.1 F (36.7 C) 99 F (37.2 C) 98.9 F (37.2 C)   TempSrc: Oral Oral Oral   Resp: 18 18 18    Height:    5\' 6"  (1.676 m)  Weight:    80.287 kg (177 lb)  SpO2: 100% 99% 100%     Intake/Output  Summary (Last 24 hours) at 08/04/14 1732 Last data filed at 08/04/14 1300  Gross per 24 hour  Intake   1170 ml  Output    200 ml  Net    970 ml    Exam:   General:  Pt is alert, not in acute distress  Cardiovascular: Regular rate and rhythm, no rubs, no gallops  Respiratory: Clear to auscultation bilaterally, no wheezing, no crackles, no rhonchi  Abdomen: Soft, non tender, non distended, bowel sounds present, no guarding  Data Reviewed: Basic Metabolic  Panel:  Recent Labs Lab 08/03/14 1810 08/03/14 1823  NA 137 136  K 3.5 3.4*  CL 99* 96*  CO2 30  --   GLUCOSE 164* 162*  BUN 38* 36*  CREATININE 1.35* 1.30*  CALCIUM 9.4  --    CBC:  Recent Labs Lab 08/03/14 1810 08/03/14 1823 08/03/14 2203 08/04/14 0945 08/04/14 1440  WBC 10.0  --  9.8 9.4 7.7  NEUTROABS 8.1*  --   --   --   --   HGB 7.6* 8.5* 7.4* 8.9* 8.8*  HCT 23.6* 25.0* 23.0* 26.5* 26.8*  MCV 74.4*  --  75.2* 75.7* 74.7*  PLT 266  --  294 265 233   CBG:  Recent Labs Lab 08/03/14 2058 08/04/14 0901 08/04/14 1147 08/04/14 1617  GLUCAP 145* 135* 225* 164*   Scheduled Meds: . calcium carbonate  1 tablet Oral BID WC  . cholecalciferol  2,000 Units Oral Daily  . clopidogrel  75 mg Oral Daily  . ferrous sulfate  325 mg Oral BID WC  . heparin  5,000 Units Subcutaneous 3 times per day  . insulin aspart  0-9 Units Subcutaneous TID WC  . levothyroxine  100 mcg Oral QAC breakfast  . rOPINIRole  3 mg Oral QHS   Continuous Infusions: . sodium chloride 100 mL/hr at 08/04/14 0400

## 2014-08-04 NOTE — Progress Notes (Addendum)
Initial Nutrition Assessment  DOCUMENTATION CODES:   Not applicable  INTERVENTION:   Glucerna Shake po TID, each supplement provides 220 kcal and 10 grams of protein  NUTRITION DIAGNOSIS:   Inadequate oral intake related to lethargy/confusion as evidenced by meal completion < 25%  GOAL:   Patient will meet greater than or equal to 90% of their needs  MONITOR:   PO intake, Supplement acceptance, Labs, Weight trends, I & O's  REASON FOR ASSESSMENT:   Malnutrition Screening Tool  ASSESSMENT:  81 y.o. Female with PMH of history of hypertension, diabetes, hypothyroidism, stroke, anemia, arthritis, hemorrhoid, breast cancer, chronic kidney disease-stage III, dementia, who presents with abnormal lab with hemoglobin 6.5.  RD unable to obtain nutrition hx or complete Nutrition Focused Physical Exam.  Pt confused upon RD visit.  Per wt readings below, pt has had a 3% weight loss since March 2016 -- not significant for time frame.  PO intake 0% per flowsheet records.  Would benefit from addition or oral nutrition supplements.  RD to order.  Diet Order:  Diet full liquid Room service appropriate?: Yes; Fluid consistency:: Thin  Skin:  Reviewed, no issues  Last BM:  7/16  Height:   Ht Readings from Last 1 Encounters:  08/04/14 5\' 6"  (1.676 m)    Weight:   Wt Readings from Last 1 Encounters:  08/04/14 177 lb (80.287 kg)    Ideal Body Weight:  59 kg  Wt Readings from Last 10 Encounters:  08/04/14 177 lb (80.287 kg)  07/10/14 177 lb (80.287 kg)  07/06/14 177 lb (80.287 kg)  04/18/14 181 lb (82.101 kg)  03/29/14 183 lb 11.2 oz (83.326 kg)  11/08/13 190 lb 6.4 oz (86.365 kg)  05/24/13 187 lb 12.8 oz (85.186 kg)  12/30/12 186 lb 12.8 oz (84.732 kg)  11/28/12 192 lb 9.6 oz (87.363 kg)  10/07/12 190 lb (86.183 kg)    BMI:  Body mass index is 28.58 kg/(m^2).  Estimated Nutritional Needs:   Kcal:  1600-1800  Protein:  80-90 gm  Fluid:  1.6-1.8 L  CBG (last 3)    Recent Labs  08/03/14 2058 08/04/14 0901 08/04/14 1147  GLUCAP 145* 135* 225*    EDUCATION NEEDS:   No education needs identified at this time  Arthur Holms, RD, LDN Pager #: [phone] After-Hours Pager #: [phone]

## 2014-08-04 NOTE — Progress Notes (Signed)
08/03/2014 11:00 PM  Patient arrived to the unit via stretcher with ED tech. Patient alert and oriented x2. Patient placed on telemetry, [place] notified. Skin assessment completed with Percell Boston, RN. No skin issues were noted. Patient IV's clean, dry and intact. Patient denies being in pain. Patient has been orientated to the room, unit and the staff. Orders have been reviewed and implemented. Call light has been placed within reach and bed alarm has been activated. RN will continue to monitor the patient.   Nena Polio BSN, RN  Phone Number: [phone]

## 2014-08-04 NOTE — Progress Notes (Signed)
PT Cancellation Note  Patient Details Name: Lisabeth Mian MRN: [id number] DOB: 17-Sep-1930   Cancelled Treatment:    Reason Eval/Treat Not Completed: Patient declined, no reason specified.  Pt agitated and stating she's not doing anything. 08/04/2014  Donnella Sham, [place] [phone]  (pager)   Treyton Slimp, Tessie Fass 08/04/2014, 4:24 PM

## 2014-08-05 DIAGNOSIS — E309 Disorder of puberty, unspecified: Secondary | ICD-10-CM | POA: Diagnosis not present

## 2014-08-05 DIAGNOSIS — R278 Other lack of coordination: Secondary | ICD-10-CM | POA: Diagnosis not present

## 2014-08-05 DIAGNOSIS — F419 Anxiety disorder, unspecified: Secondary | ICD-10-CM | POA: Diagnosis not present

## 2014-08-05 DIAGNOSIS — E1122 Type 2 diabetes mellitus with diabetic chronic kidney disease: Secondary | ICD-10-CM | POA: Diagnosis not present

## 2014-08-05 DIAGNOSIS — R531 Weakness: Secondary | ICD-10-CM | POA: Diagnosis not present

## 2014-08-05 DIAGNOSIS — N189 Chronic kidney disease, unspecified: Secondary | ICD-10-CM | POA: Diagnosis not present

## 2014-08-05 DIAGNOSIS — E1151 Type 2 diabetes mellitus with diabetic peripheral angiopathy without gangrene: Secondary | ICD-10-CM | POA: Diagnosis not present

## 2014-08-05 DIAGNOSIS — I6330 Cerebral infarction due to thrombosis of unspecified cerebral artery: Secondary | ICD-10-CM | POA: Diagnosis not present

## 2014-08-05 DIAGNOSIS — E039 Hypothyroidism, unspecified: Secondary | ICD-10-CM | POA: Diagnosis not present

## 2014-08-05 DIAGNOSIS — I739 Peripheral vascular disease, unspecified: Secondary | ICD-10-CM | POA: Diagnosis not present

## 2014-08-05 DIAGNOSIS — I6930 Unspecified sequelae of cerebral infarction: Secondary | ICD-10-CM | POA: Diagnosis not present

## 2014-08-05 DIAGNOSIS — D631 Anemia in chronic kidney disease: Secondary | ICD-10-CM | POA: Diagnosis not present

## 2014-08-05 DIAGNOSIS — R7989 Other specified abnormal findings of blood chemistry: Secondary | ICD-10-CM | POA: Diagnosis not present

## 2014-08-05 DIAGNOSIS — I129 Hypertensive chronic kidney disease with stage 1 through stage 4 chronic kidney disease, or unspecified chronic kidney disease: Secondary | ICD-10-CM | POA: Diagnosis not present

## 2014-08-05 DIAGNOSIS — E1165 Type 2 diabetes mellitus with hyperglycemia: Secondary | ICD-10-CM | POA: Diagnosis not present

## 2014-08-05 DIAGNOSIS — I639 Cerebral infarction, unspecified: Secondary | ICD-10-CM | POA: Diagnosis not present

## 2014-08-05 DIAGNOSIS — I10 Essential (primary) hypertension: Secondary | ICD-10-CM | POA: Diagnosis not present

## 2014-08-05 DIAGNOSIS — N183 Chronic kidney disease, stage 3 (moderate): Secondary | ICD-10-CM | POA: Diagnosis not present

## 2014-08-05 DIAGNOSIS — L603 Nail dystrophy: Secondary | ICD-10-CM | POA: Diagnosis not present

## 2014-08-05 DIAGNOSIS — R4701 Aphasia: Secondary | ICD-10-CM | POA: Diagnosis not present

## 2014-08-05 DIAGNOSIS — E1121 Type 2 diabetes mellitus with diabetic nephropathy: Secondary | ICD-10-CM | POA: Diagnosis not present

## 2014-08-05 DIAGNOSIS — Z794 Long term (current) use of insulin: Secondary | ICD-10-CM | POA: Diagnosis not present

## 2014-08-05 DIAGNOSIS — R41841 Cognitive communication deficit: Secondary | ICD-10-CM | POA: Diagnosis not present

## 2014-08-05 DIAGNOSIS — I63311 Cerebral infarction due to thrombosis of right middle cerebral artery: Secondary | ICD-10-CM | POA: Diagnosis not present

## 2014-08-05 DIAGNOSIS — F0390 Unspecified dementia without behavioral disturbance: Secondary | ICD-10-CM | POA: Diagnosis not present

## 2014-08-05 DIAGNOSIS — E119 Type 2 diabetes mellitus without complications: Secondary | ICD-10-CM | POA: Diagnosis not present

## 2014-08-05 DIAGNOSIS — K649 Unspecified hemorrhoids: Secondary | ICD-10-CM | POA: Diagnosis not present

## 2014-08-05 DIAGNOSIS — D649 Anemia, unspecified: Secondary | ICD-10-CM | POA: Diagnosis not present

## 2014-08-05 DIAGNOSIS — N390 Urinary tract infection, site not specified: Secondary | ICD-10-CM | POA: Diagnosis not present

## 2014-08-05 DIAGNOSIS — D509 Iron deficiency anemia, unspecified: Secondary | ICD-10-CM | POA: Diagnosis not present

## 2014-08-05 DIAGNOSIS — D500 Iron deficiency anemia secondary to blood loss (chronic): Secondary | ICD-10-CM | POA: Diagnosis not present

## 2014-08-05 DIAGNOSIS — M6281 Muscle weakness (generalized): Secondary | ICD-10-CM | POA: Diagnosis not present

## 2014-08-05 DIAGNOSIS — G250 Essential tremor: Secondary | ICD-10-CM | POA: Diagnosis not present

## 2014-08-05 DIAGNOSIS — I69391 Dysphagia following cerebral infarction: Secondary | ICD-10-CM | POA: Diagnosis not present

## 2014-08-05 DIAGNOSIS — I69820 Aphasia following other cerebrovascular disease: Secondary | ICD-10-CM | POA: Diagnosis not present

## 2014-08-05 LAB — TYPE AND SCREEN
ABO/RH(D): O NEG
Antibody Screen: NEGATIVE
UNIT DIVISION: 0
Unit division: 0

## 2014-08-05 LAB — BASIC METABOLIC PANEL
Anion gap: 9 (ref 5–15)
BUN: 27 mg/dL — ABNORMAL HIGH (ref 6–20)
CALCIUM: 9.3 mg/dL (ref 8.9–10.3)
CO2: 28 mmol/L (ref 22–32)
Chloride: 100 mmol/L — ABNORMAL LOW (ref 101–111)
Creatinine, Ser: 1.01 mg/dL — ABNORMAL HIGH (ref 0.44–1.00)
GFR calc Af Amer: 59 mL/min — ABNORMAL LOW (ref >60)
GFR calc non Af Amer: 51 mL/min — ABNORMAL LOW (ref >60)
GLUCOSE: 144 mg/dL — AB (ref 65–99)
Potassium: 3.9 mmol/L (ref 3.5–5.1)
Sodium: 137 mmol/L (ref 135–145)

## 2014-08-05 LAB — CBC
HCT: 29.1 % — ABNORMAL LOW (ref 36.0–46.0)
HEMOGLOBIN: 9.4 g/dL — AB (ref 12.0–15.0)
MCH: 24.3 pg — ABNORMAL LOW (ref 26.0–34.0)
MCHC: 32.3 g/dL (ref 30.0–36.0)
MCV: 75.2 fL — AB (ref 78.0–100.0)
Platelets: 258 10*3/uL (ref 150–400)
RBC: 3.87 MIL/uL (ref 3.87–5.11)
RDW: 14.4 % (ref 11.5–15.5)
WBC: 8.2 10*3/uL (ref 4.0–10.5)

## 2014-08-05 LAB — GLUCOSE, CAPILLARY
GLUCOSE-CAPILLARY: 156 mg/dL — AB (ref 65–99)
Glucose-Capillary: 132 mg/dL — ABNORMAL HIGH (ref 65–99)
Glucose-Capillary: 198 mg/dL — ABNORMAL HIGH (ref 65–99)

## 2014-08-05 LAB — MRSA PCR SCREENING: MRSA by PCR: NEGATIVE

## 2014-08-05 NOTE — Progress Notes (Signed)
Report called to [facility]. Family notified of transfer back to facility. All belongings gathered.Awaiting ambulance.

## 2014-08-05 NOTE — Clinical Social Work Note (Signed)
CSW was notified that patient is from a facility [facility]) and would DC today. CSW has confirmed this plan with family and has confirmed that [facility] can accept the patient back today. CSW unable to complete full psychosocial assessment with family at this time. Per MD patient ready to DC back to [facility]. RN, patient/family [name]), and facility notified of patient's DC. RN given number for report. DC packet on patient's chart. Ambulance transport requested for patient for 6:00PM. CSW signing off at this time.   Liz Beach MSW, [place], [place], [phone]

## 2014-08-05 NOTE — Discharge Summary (Addendum)
Physician Discharge Summary  [name] LOV:[id number] DOB: [birth year] DOA: 08/03/2014  PCP: Antony Blackbird, MD  Admit date: 08/03/2014 Discharge date: 08/05/2014  Recommendations for Outpatient Follow-up:  1. Pt will need to follow up with PCP in 2-3 weeks post discharge 2. Please obtain BMP to evaluate electrolytes and kidney function 3. Please also check CBC to evaluate Hg and Hct levels 4. Due to confusion, ativan and narcotic held, please resume if needed   Discharge Diagnoses:  Principal Problem:   Anemia Active Problems:   Hypothyroidism   Essential hypertension   Breast cancer, left breast   Anemia in CKD (chronic kidney disease)   CKD (chronic kidney disease) stage 3, GFR 30-59 ml/min   DM2 (diabetes mellitus, type 2)   Cerebral thrombosis with cerebral infarction  Discharge Condition: Stable  Diet recommendation: Heart healthy diet discussed in details   Brief narrative:    81 y.o. female with PMH of history of hypertension, diabetes, hypothyroidism, stroke, anemia, arthritis, hemorrhoid, breast cancer, chronic kidney disease-stage III, dementia, who presented with abnormal lab with hemoglobin 6.5.  Patient has dementia and is unable to provide much history. Therefore, most of the history is obtained by discussing the case with the ED physician and the nursing staff. Patient is from SNF and was sent to ED because of hgb drop. Patient had blood work done in SNF and was found to have hemoglobin decrease from baseline 9-10 to 6.5. She had hemoglobin checked a few days ago which was 7.6.   In ED, patient was found to have hemoglobin 7.6, negative FOBT, dry mucus and membrane, temperature normal, notachycardia, potassium 3.4, stable renal function. Patient admitted to inpatient for further eval and treatment.  Assessment/Plan:    Acute on chronic blood loss anemia, IDA - with baseline Hg ~ 9-10 and drop to 7.6 on admission - s/p 2 U PRBC transfusion, pt looks  clinically stable this AM, appropriate increase in post transfusion Hg up to 8.9 - no signs of active bleeding and FOBT was negative - continue conservative management - pt is not candidate for an intervention given advanced dementia and poor performance status - transfuse as needed  Acute encephalopathy - possibly related to underlying dementia with use of ativan or narcotic - minimize use as possible - mental status clear this AM  Hypothyroidism - Last TSH was 4.26 on 04/18/14 - Continue home Synthroid  HTN - reasonable inpatient control  CKD (chronic kidney disease) stage 3 - GFR 30-59 ml/min: stable. Baseline creatinine 1.3-1.5 - Cr 1.30 on admission, which is at baseline. - Cr now trending down  DM-II with complications of CKD - Last A1c 5.4 on 07/05/14  Recent Stroke in pt with known dementia  - stable - on plavix at home, will continue since FOBT is negative - SLP requested   Hypokalemia - supplemented, WNL this AM  Overweight  - Body mass index is 28.58 kg/(m^2).  Code Status: Full.  Family Communication: No family at bedside Disposition Plan: SNF   IV access:  Peripheral IV  Procedures and diagnostic studies:    Mr Brain Wo Contrast July 12, 2014 Punctate 5 mm acute ischemic infarct within the right temporal lobe. No associated hemorrhage or mass effect. 2. No other acute intracranial process. 3. Remote left temporoccipital infarct with additional small remote lacunar infarcts within the bilateral basal ganglia and pons. 4. Advanced cerebral atrophy with moderate chronic microvascular ischemic disease.   Medical Consultants:  None  Other Consultants:  SLP PT  IAnti-Infectives:  None      Discharge Exam: Filed Vitals:   08/05/14 0955  BP: 167/75  Pulse: 70  Temp: 98.2 F (36.8 C)  Resp: 18   Filed Vitals:   08/04/14 1743 08/04/14 2142 08/05/14 0403 08/05/14 0955  BP: 148/77 142/71 143/70 167/75  Pulse: 71 70 72 70   Temp: 98.2 F (36.8 C) 99.7 F (37.6 C) 98.6 F (37 C) 98.2 F (36.8 C)  TempSrc: Oral Axillary Oral Oral  Resp: 18 17 17 18   Height:      Weight:  80.2 kg (176 lb 12.9 oz)    SpO2: 100% 100% 96% 98%    General: Pt is alert, follows commands appropriately, not in acute distress Cardiovascular: Regular rate and rhythm, S1/S2 +, no rubs, no gallops Respiratory: Clear to auscultation bilaterally, no wheezing, no crackles, no rhonchi Abdominal: Soft, non tender, non distended, bowel sounds +, no guarding  Discharge Instructions  Discharge Instructions    Diet - low sodium heart healthy    Complete by:  As directed      Increase activity slowly    Complete by:  As directed             Medication List    TAKE these medications        CALCIUM 600 600 MG Tabs tablet  Generic drug:  calcium carbonate  Take 600 mg by mouth 2 (two) times daily with a meal.     clopidogrel 75 MG tablet  Commonly known as:  PLAVIX  Take 1 tablet (75 mg total) by mouth daily.     cyanocobalamin 1000 MCG tablet  Take 1 tablet (1,000 mcg total) by mouth daily.     ferrous sulfate 325 (65 FE) MG tablet  Take 325 mg by mouth 2 (two) times daily with a meal.     HYDROcodone-acetaminophen 10-325 MG per tablet  Commonly known as:  NORCO  Take 1 tablet by mouth every 6 (six) hours as needed for moderate pain or severe pain.     indapamide 1.25 MG tablet  Commonly known as:  LOZOL  TAKE ONE TABLET BY MOUTH IN THE MORNING     insulin aspart 100 UNIT/ML injection  Commonly known as:  novoLOG  Inject 0-9 Units into the skin 3 (three) times daily with meals.     insulin lispro 100 UNIT/ML injection  Commonly known as:  HUMALOG  Inject 0-9 Units into the skin 3 (three) times daily before meals. Sliding scale: 121-150 = 1 unit, 151-200 = 2 units, 201-250 = 3 units, 251-300 = 5 units, 301-350 = 7 units, 351-400 = 9 units     levothyroxine 100 MCG tablet  Commonly known as:  SYNTHROID, LEVOTHROID   Take 100 mcg by mouth daily before breakfast.     levothyroxine 88 MCG tablet  Commonly known as:  SYNTHROID, LEVOTHROID  TAKE ONE TABLET BY MOUTH ONCE DAILY     LORazepam 0.5 MG tablet  Commonly known as:  ATIVAN  Take 0.25 mg by mouth every 8 (eight) hours.     rOPINIRole 3 MG tablet  Commonly known as:  REQUIP  Take 3 mg by mouth at bedtime.     vitamin C 1000 MG tablet  Take 1,000 mg by mouth daily.     Vitamin D 2000 UNITS tablet  Take 2,000 Units by mouth daily.           Follow-up Information    Follow up with FULP, CAMMIE, MD.  Specialty:  Family Medicine   Contact information:   [address] [place] [address] [phone]       Call Faye Ramsay, MD.   Specialty:  Internal Medicine   Why:  As needed   Contact information:   [address] [place] [address] [phone]        The results of significant diagnostics from this hospitalization (including imaging, microbiology, ancillary and laboratory) are listed below for reference.     Microbiology: Recent Results (from the past 240 hour(s))  MRSA PCR Screening     Status: None   Collection Time: 08/05/14  1:08 AM  Result Value Ref Range Status   MRSA by PCR NEGATIVE NEGATIVE Final    Comment:        The GeneXpert MRSA Assay (FDA approved for NASAL specimens only), is one component of a comprehensive MRSA colonization surveillance program. It is not intended to diagnose MRSA infection nor to guide or monitor treatment for MRSA infections.      Labs: Basic Metabolic Panel:  Recent Labs Lab 08/03/14 1810 08/03/14 1823 08/05/14 0515  NA 137 136 137  K 3.5 3.4* 3.9  CL 99* 96* 100*  CO2 30  --  28  GLUCOSE 164* 162* 144*  BUN 38* 36* 27*  CREATININE 1.35* 1.30* 1.01*  CALCIUM 9.4  --  9.3    CBC:  Recent Labs Lab 08/03/14 1810 08/03/14 1823 08/03/14 2203 08/04/14 0945 08/04/14 1440 08/05/14 0515  WBC 10.0  --  9.8 9.4 7.7 8.2   NEUTROABS 8.1*  --   --   --   --   --   HGB 7.6* 8.5* 7.4* 8.9* 8.8* 9.4*  HCT 23.6* 25.0* 23.0* 26.5* 26.8* 29.1*  MCV 74.4*  --  75.2* 75.7* 74.7* 75.2*  PLT 266  --  294 265 233 258   CBG:  Recent Labs Lab 08/04/14 0901 08/04/14 1147 08/04/14 1617 08/04/14 2140 08/05/14 0747  GLUCAP 135* 225* 164* 100* 132*     SIGNED: Time coordinating discharge: 30 minutes  MAGICK-MYERS, ISKRA, MD  [facility] 08/05/2014, 11:52 AM Pager [phone]  If 7PM-7AM, please contact night-coverage [email or web address] Password [email or web address]

## 2014-08-05 NOTE — Discharge Instructions (Signed)
Anemia, Nonspecific Anemia is a condition in which the concentration of red blood cells or hemoglobin in the blood is below normal. Hemoglobin is a substance in red blood cells that carries oxygen to the tissues of the body. Anemia results in not enough oxygen reaching these tissues.  CAUSES  Common causes of anemia include:   Excessive bleeding. Bleeding may be internal or external. This includes excessive bleeding from periods (in women) or from the intestine.   Poor nutrition.   Chronic kidney, thyroid, and liver disease.  Bone marrow disorders that decrease red blood cell production.  Cancer and treatments for cancer.  HIV, AIDS, and their treatments.  Spleen problems that increase red blood cell destruction.  Blood disorders.  Excess destruction of red blood cells due to infection, medicines, and autoimmune disorders. SIGNS AND SYMPTOMS   Minor weakness.   Dizziness.   Headache.  Palpitations.   Shortness of breath, especially with exercise.   Paleness.  Cold sensitivity.  Indigestion.  Nausea.  Difficulty sleeping.  Difficulty concentrating. Symptoms may occur suddenly or they may develop slowly.  DIAGNOSIS  Additional blood tests are often needed. These help your health care provider determine the best treatment. Your health care provider will check your stool for blood and look for other causes of blood loss.  TREATMENT  Treatment varies depending on the cause of the anemia. Treatment can include:   Supplements of iron, vitamin B12, or folic acid.   Hormone medicines.   A blood transfusion. This may be needed if blood loss is severe.   Hospitalization. This may be needed if there is significant continual blood loss.   Dietary changes.  Spleen removal. HOME CARE INSTRUCTIONS Keep all follow-up appointments. It often takes many weeks to correct anemia, and having your health care provider check on your condition and your response to  treatment is very important. SEEK IMMEDIATE MEDICAL CARE IF:   You develop extreme weakness, shortness of breath, or chest pain.   You become dizzy or have trouble concentrating.  You develop heavy vaginal bleeding.   You develop a rash.   You have bloody or black, tarry stools.   You faint.   You vomit up blood.   You vomit repeatedly.   You have abdominal pain.  You have a fever or persistent symptoms for more than 2-3 days.   You have a fever and your symptoms suddenly get worse.   You are dehydrated.  MAKE SURE YOU:  Understand these instructions.  Will watch your condition.  Will get help right away if you are not doing well or get worse. Document Released: 02/13/2004 Document Revised: 09/07/2012 Document Reviewed: 07/01/2012 Exit[employer] Patient Information 2015 [employer]. This information is not intended to replace advice given to you by your health care provider. Make sure you discuss any questions you have with your health care provider.  

## 2014-08-05 NOTE — Progress Notes (Signed)
Ambulance here to transport patient to [facility]. All belongings sent with patient.

## 2014-08-05 NOTE — Evaluation (Signed)
Physical Therapy Evaluation Patient Details Name: [name] MRN: [id number] DOB: 1930/06/22 Today's Date: 08/05/2014   History of Present Illness  81 y.o. female with PMH of history of hypertension, diabetes, hypothyroidism, stroke, anemia, arthritis, hemorrhoid, breast cancer, chronic kidney disease-stage III, dementia, who presented with abnormal lab with hemoglobin 6.5.  Clinical Impression  Pt admitted with above diagnosis. Pt currently with functional limitations due to the deficits listed below (see PT Problem List).  Pt will benefit from skilled PT to increase their independence and safety with mobility and decrease caregiver burden, and  to allow discharge to the venue listed below.       Follow Up Recommendations SNF;Supervision/Assistance - 24 hour    Equipment Recommendations   (will defer to SNF)    Recommendations for Other Services       Precautions / Restrictions Precautions Precautions: Fall      Mobility  Bed Mobility Overal bed mobility: Needs Assistance Bed Mobility: Rolling Rolling: +2 for physical assistance;Max assist         General bed mobility comments: +2 max assist for rolling R and Left; cues to initiate with looking at and reaching for rails, and verbal and tactile cues to lift and kick contralateral LE over to assist in rolling; Max assist to acheive fully sidelying L to assist with hygeine, and RN assisted pt with having a BM; Session limited to bed mobilty, given need to move bowels  Transfers                    Ambulation/Gait                Stairs            Wheelchair Mobility    Modified Rankin (Stroke Patients Only)       Balance Overall balance assessment:  (not assessed this session)                                           Pertinent Vitals/Pain Pain Assessment: Faces Faces Pain Scale: Hurts even more Pain Location: Grimace with movement, rolling, and attempting BM Pain  Descriptors / Indicators: Grimacing Pain Intervention(s): Limited activity within patient's tolerance;Monitored during session;Repositioned    Home Living Family/patient expects to be discharged to:: Skilled nursing facility                 Additional Comments: family not present in session. Pt unreliable historian    Prior Function Level of Independence: Needs assistance         Comments: Her need for assistance is inferred from current status, and from chart review and status at most recent admission approx 4 wks ago     Hand Dominance   Dominant Hand: Right    Extremity/Trunk Assessment   Upper Extremity Assessment: Defer to OT evaluation;Generalized weakness           Lower Extremity Assessment:  (Noted generally weak R and LLEs; able to initiate flex hip and knees against gravity, with mod assist to achieve hooklying)         Communication   Communication: No difficulties (though unreliable historian)  Cognition Arousal/Alertness: Awake/alert Behavior During Therapy: Anxious Overall Cognitive Status: No family/caregiver present to determine baseline cognitive functioning       Memory: Decreased short-term memory (with noted history of dementia)  General Comments General comments (skin integrity, edema, etc.): [name] initially stated she didn't want to move at all, but we were able to assess rolling as she needed to move her bowels, and this therapist assisted with rolling and holding sidelying position for bedpan placement and hygeine; Pt was successful with having a Bowel movement    Exercises        Assessment/Plan    PT Assessment Patient needs continued PT services  PT Diagnosis Generalized weakness   PT Problem List Decreased strength;Decreased range of motion;Decreased activity tolerance;Decreased balance;Decreased mobility;Decreased cognition;Pain  PT Treatment Interventions DME instruction;Functional mobility  training;Therapeutic activities;Therapeutic exercise;Balance training;Neuromuscular re-education;Cognitive remediation;Patient/family education   PT Goals (Current goals can be found in the Care Plan section) Acute Rehab PT Goals Patient Stated Goal: to have BM, and she met that goal! PT Goal Formulation: Patient unable to participate in goal setting Time For Goal Achievement: 08/19/14 Potential to Achieve Goals: Fair    Frequency Min 3X/week   Barriers to discharge        Co-evaluation               End of Session Equipment Utilized During Treatment:  (bed pad) Activity Tolerance: Other (comment) (limited by pt's refusal to get OOB) Patient left: in bed;with call bell/phone within reach;with bed alarm set Nurse Communication: Mobility status;Need for lift equipment    Functional Assessment Tool Used: Clinical Judgement Functional Limitation: Mobility: Walking and moving around Mobility: Walking and Moving Around Current Status [phone]): At least 80 percent but less than 100 percent impaired, limited or restricted Mobility: Walking and Moving Around Goal Status [phone]): At least 40 percent but less than 60 percent impaired, limited or restricted    Time: 1032-1056 PT Time Calculation (min) (ACUTE ONLY): 24 min   Charges:   PT Evaluation $Initial PT Evaluation Tier I: 1 Procedure PT Treatments $Therapeutic Activity: 8-22 mins   PT G Codes:   PT G-Codes **NOT FOR INPATIENT CLASS** Functional Assessment Tool Used: Clinical Judgement Functional Limitation: Mobility: Walking and moving around Mobility: Walking and Moving Around Current Status ([id number]): At least 80 percent but less than 100 percent impaired, limited or restricted Mobility: Walking and Moving Around Goal Status [phone]): At least 40 percent but less than 60 percent impaired, limited or restricted    Roney Marion [facility] 08/05/2014, 11:15 AM  Roney Marion, [place] Pager  [phone] Office [phone]

## 2014-08-07 NOTE — Progress Notes (Signed)
Late entry for missed Modified Rankin Score.  Based on review of medical record.  Not sure of the accuracy of pre-morbid state as it appears pt was a poor historian.    07/07/14 1442  Modified Rankin (Stroke Patients Only)  Pre-Morbid Rankin Score 3  Modified Rankin [place], [place]  [phone] 08/07/2014

## 2014-08-09 ENCOUNTER — Other Ambulatory Visit (HOSPITAL_BASED_OUTPATIENT_CLINIC_OR_DEPARTMENT_OTHER): Payer: Medicare Other

## 2014-08-09 ENCOUNTER — Ambulatory Visit (HOSPITAL_BASED_OUTPATIENT_CLINIC_OR_DEPARTMENT_OTHER): Payer: Medicare Other

## 2014-08-09 ENCOUNTER — Encounter: Payer: Self-pay | Admitting: *Deleted

## 2014-08-09 VITALS — BP 132/65 | HR 72 | Temp 98.5°F

## 2014-08-09 DIAGNOSIS — N189 Chronic kidney disease, unspecified: Secondary | ICD-10-CM

## 2014-08-09 DIAGNOSIS — D631 Anemia in chronic kidney disease: Secondary | ICD-10-CM | POA: Diagnosis not present

## 2014-08-09 LAB — CBC WITH DIFFERENTIAL/PLATELET
BASO%: 0.6 % (ref 0.0–2.0)
Basophils Absolute: 0.1 10*3/uL (ref 0.0–0.1)
EOS ABS: 0.1 10*3/uL (ref 0.0–0.5)
EOS%: 1.3 % (ref 0.0–7.0)
HCT: 30.6 % — ABNORMAL LOW (ref 34.8–46.6)
HEMOGLOBIN: 9.8 g/dL — AB (ref 11.6–15.9)
LYMPH%: 13.2 % — ABNORMAL LOW (ref 14.0–49.7)
MCH: 24.8 pg — ABNORMAL LOW (ref 25.1–34.0)
MCHC: 32.1 g/dL (ref 31.5–36.0)
MCV: 77.1 fL — AB (ref 79.5–101.0)
MONO#: 0.5 10*3/uL (ref 0.1–0.9)
MONO%: 5.5 % (ref 0.0–14.0)
NEUT%: 79.4 % — AB (ref 38.4–76.8)
NEUTROS ABS: 7.6 10*3/uL — AB (ref 1.5–6.5)
PLATELETS: 297 10*3/uL (ref 145–400)
RBC: 3.97 10*6/uL (ref 3.70–5.45)
RDW: 15.2 % — ABNORMAL HIGH (ref 11.2–14.5)
WBC: 9.6 10*3/uL (ref 3.9–10.3)
lymph#: 1.3 10*3/uL (ref 0.9–3.3)

## 2014-08-09 MED ORDER — DARBEPOETIN ALFA 300 MCG/0.6ML IJ SOSY
300.0000 ug | PREFILLED_SYRINGE | Freq: Once | INTRAMUSCULAR | Status: AC
  Administered 2014-08-09: 300 ug via SUBCUTANEOUS
  Filled 2014-08-09: qty 0.6

## 2014-08-09 NOTE — Progress Notes (Signed)
[facility] Work  Clinical Social Work met with patient in lobby after lab/injection appointment.  Ms. Davisson is currently residing at [facility] after experiencing a stroke.  CSW provided brief emotional support and will follow up with patient as needed.  Polo Riley, MSW, LCSW, OSW-C Clinical Social Worker [facility] [phone]

## 2014-08-13 DIAGNOSIS — F0390 Unspecified dementia without behavioral disturbance: Secondary | ICD-10-CM | POA: Diagnosis not present

## 2014-08-13 DIAGNOSIS — F419 Anxiety disorder, unspecified: Secondary | ICD-10-CM | POA: Diagnosis not present

## 2014-08-28 DIAGNOSIS — F0390 Unspecified dementia without behavioral disturbance: Secondary | ICD-10-CM | POA: Diagnosis not present

## 2014-08-28 DIAGNOSIS — N183 Chronic kidney disease, stage 3 (moderate): Secondary | ICD-10-CM | POA: Diagnosis not present

## 2014-08-28 DIAGNOSIS — Z794 Long term (current) use of insulin: Secondary | ICD-10-CM | POA: Diagnosis not present

## 2014-08-28 DIAGNOSIS — E039 Hypothyroidism, unspecified: Secondary | ICD-10-CM | POA: Diagnosis not present

## 2014-08-28 DIAGNOSIS — I6930 Unspecified sequelae of cerebral infarction: Secondary | ICD-10-CM | POA: Diagnosis not present

## 2014-08-28 DIAGNOSIS — I10 Essential (primary) hypertension: Secondary | ICD-10-CM | POA: Diagnosis not present

## 2014-08-28 DIAGNOSIS — D649 Anemia, unspecified: Secondary | ICD-10-CM | POA: Diagnosis not present

## 2014-08-28 DIAGNOSIS — E1121 Type 2 diabetes mellitus with diabetic nephropathy: Secondary | ICD-10-CM | POA: Diagnosis not present

## 2014-08-30 ENCOUNTER — Encounter: Payer: Self-pay | Admitting: Adult Health

## 2014-08-30 ENCOUNTER — Non-Acute Institutional Stay (SKILLED_NURSING_FACILITY): Payer: Medicare Other | Admitting: Adult Health

## 2014-08-30 DIAGNOSIS — F419 Anxiety disorder, unspecified: Secondary | ICD-10-CM | POA: Diagnosis not present

## 2014-08-30 DIAGNOSIS — E039 Hypothyroidism, unspecified: Secondary | ICD-10-CM

## 2014-08-30 DIAGNOSIS — I63311 Cerebral infarction due to thrombosis of right middle cerebral artery: Secondary | ICD-10-CM

## 2014-08-30 DIAGNOSIS — L603 Nail dystrophy: Secondary | ICD-10-CM | POA: Diagnosis not present

## 2014-08-30 DIAGNOSIS — I10 Essential (primary) hypertension: Secondary | ICD-10-CM | POA: Diagnosis not present

## 2014-08-30 DIAGNOSIS — I739 Peripheral vascular disease, unspecified: Secondary | ICD-10-CM | POA: Diagnosis not present

## 2014-08-30 DIAGNOSIS — F0390 Unspecified dementia without behavioral disturbance: Secondary | ICD-10-CM | POA: Diagnosis not present

## 2014-08-30 DIAGNOSIS — D509 Iron deficiency anemia, unspecified: Secondary | ICD-10-CM | POA: Diagnosis not present

## 2014-08-30 DIAGNOSIS — G250 Essential tremor: Secondary | ICD-10-CM | POA: Diagnosis not present

## 2014-08-30 DIAGNOSIS — E119 Type 2 diabetes mellitus without complications: Secondary | ICD-10-CM

## 2014-08-30 DIAGNOSIS — E1151 Type 2 diabetes mellitus with diabetic peripheral angiopathy without gangrene: Secondary | ICD-10-CM | POA: Diagnosis not present

## 2014-08-30 LAB — TSH: TSH: 7.44 u[IU]/mL — AB (ref 0.41–5.90)

## 2014-09-02 NOTE — Progress Notes (Signed)
Patient ID: [name], female   DOB: 1930-10-25, 81 y.o.   MRN: [id number]   08/30/14  Facility:  Nursing Home Location:  [place] Room Number: 4287-6 LEVEL OF CARE:  SNF (31)   Chief Complaint  Patient presents with  . Medical Management of Chronic Issues    CVA, hypertension, diabetes mellitus, essential tremor, anxiety, anemia, dementia and hypothyroidism    HISTORY OF PRESENT ILLNESS:  This is an 81 year old female who is being seen for for a routine visit. She is currently having a short-term rehabilitation. She has been admitted to [facility] on 07/09/14 from [facility]. She has PMH of hypertension, diabetes mellitus type 2, prior CVA, breast cancer status post left breast mastectomy and TIA. She presented to the ED with slurred speech and confusion. CT head shows no acute infarct but presence of chronic left occipital/temporal and bilateral basal ganglia infarcts. She was not administered with TPA secondary to outside IV TPA window. She was started on Plavix 75 mg daily.  She had a recent transfusion of 2 units Packed RBC due to severe anemia, hgb 6.5. Latest hgb is 9.8.  PAST MEDICAL HISTORY:  Past Medical History  Diagnosis Date  . Hemorrhoid   . Anemia   . Arthritis   . Stroke     CURRENT MEDICATIONS: Reviewed per MAR/see medication list  Allergies  Allergen Reactions  . Aspirin Hives  . Penicillins Hives     REVIEW OF SYSTEMS:  GENERAL: no fever, chills or weakness RESPIRATORY: no cough, SOB, DOE, wheezing, hemoptysis CARDIAC: no chest pain, edema or palpitations GI: no abdominal pain, diarrhea, constipation, heart burn, nausea or vomiting  PHYSICAL EXAMINATION  GENERAL: no acute distress, normal body habitus SKIN:  Right heel has DTI, unstageable EYES: conjunctivae normal, sclerae normal, normal eye lids NECK: supple, trachea midline, no neck masses, no thyroid tenderness, no thyromegaly LYMPHATICS: no LAN  in the neck, no supraclavicular LAN RESPIRATORY: breathing is even & unlabored, BS CTAB CARDIAC: RRR, no murmur,no extra heart sounds, no edema GI: abdomen soft, normal BS, no masses, no tenderness, no hepatomegaly, no splenomegaly EXTREMITIES: RLE with AFO; LLE with floatation boot and knee brace; left hemiparesis PSYCHIATRIC: the patient is alert & oriented to person, affect & behavior appropriate  LABS/RADIOLOGY: Labs reviewed: 08/09/14  WBC 9.6 hemoglobin 9.8 hematocrit 30.6 MCV 77.1 platelet count 297 08/06/14  WBC 6.5 hemoglobin 9.1 hematocrit 28.9 MCV 76.9 platelet count 266 sodium 139 potassium 4.2 glucose 146 BUN 77 creatinine 0.88 calcium 9.6 08/03/14  WBC 8.3 hemoglobin 6.5 hematocrit 20.5 MCV 75.6 platelet count 239 07/26/14  WBC 9.4 hemoglobin 7.6 hematocrit 24.2 MCV 77.1 platelet count 380 07/17/14  WBC 8.8 hemoglobin 8.1 hematocrit 26.0 MCV 77.6 platelet count 382 sodium 140 potassium 4.6 glucose 163 BUN 31 creatinine 1.07 calcium 9.3 tsh 8.115 Basic Metabolic Panel:  Recent Labs  07/08/14 1020 08/03/14 1810 08/03/14 1823 08/05/14 0515  NA 137 137 136 137  K 3.8 3.5 3.4* 3.9  CL 102 99* 96* 100*  CO2 27 30  --  28  GLUCOSE 128* 164* 162* 144*  BUN 23* 38* 36* 27*  CREATININE 1.52* 1.35* 1.30* 1.01*  CALCIUM 8.6* 9.4  --  9.3   Liver Function Tests:  Recent Labs  04/18/14 1539 07/05/14 2216 07/08/14 1020  AST 15 23 18   ALT 7 9* 9*  ALKPHOS 79 58 58  BILITOT 0.5 0.5 0.8  PROT 6.6 6.0* 6.6  ALBUMIN 3.3* 2.9* 2.7*  CBC:  Recent Labs  07/05/14 2216  08/03/14 1810  08/04/14 1440 08/05/14 0515 08/09/14 1405  WBC 9.8  --  10.0  < > 7.7 8.2 9.6  NEUTROABS 7.8*  --  8.1*  --   --   --  7.6*  HGB 10.4*  < > 7.6*  < > 8.8* 9.4* 9.8*  HCT 32.7*  < > 23.6*  < > 26.8* 29.1* 30.6*  MCV 76.9*  --  74.4*  < > 74.7* 75.2* 77.1*  PLT 265  --  266  < > 233 258 297  < > = values in this interval not displayed. Lipid Panel:  Recent Labs  04/18/14 1539  07/07/14 0500  HDL 35.90* 37*   CBG:  Recent Labs  08/05/14 0747 08/05/14 1153 08/05/14 1632  GLUCAP 132* 198* 156*     No results found.  ASSESSMENT/PLAN:  CVA - continue rehabilitation; continue Plavix 75 mg by mouth daily; follow-up with Dr. Leonie Man, neurology  Hypertension - continue Lozol 1.25 mg by mouth every morning and metoprolol 50 mg 24 hour 1 tab by mouth daily  Diabetes mellitus, type II - hemoglobin A1c 5.3; continue NovoLog sliding scale subcutaneous 3 times a day with meals  Essential tremor - continue Requip 3 mg by mouth daily at bedtime  Anemia, iron deficiency - hemoglobin 9.8; S/P transfusion of 2 units packed RBC; continue ferrous sulfate 325 mg 1 tab by mouth twice a day  Hypothyroidism -  tsh 7.346; Synthroid 88 g by mouth daily; check tsh  Anxiety - mood is stable; continue Paxil 10 mg 1 tab PO daily and Ativan 0.25 mg PO TID  Dementia - advanced; continue supportive care    Goals of care:  Short-term rehabilitation    [facility], NP [facility] [phone]

## 2014-09-06 ENCOUNTER — Encounter: Payer: Self-pay | Admitting: Endocrinology

## 2014-09-06 ENCOUNTER — Encounter: Payer: Self-pay | Admitting: Adult Health

## 2014-09-06 ENCOUNTER — Non-Acute Institutional Stay (SKILLED_NURSING_FACILITY): Payer: Medicare Other | Admitting: Adult Health

## 2014-09-06 ENCOUNTER — Ambulatory Visit: Payer: Medicare Other | Admitting: Endocrinology

## 2014-09-06 ENCOUNTER — Ambulatory Visit (INDEPENDENT_AMBULATORY_CARE_PROVIDER_SITE_OTHER): Payer: Medicare Other | Admitting: Endocrinology

## 2014-09-06 VITALS — BP 122/76 | HR 77 | Temp 99.1°F | Resp 14

## 2014-09-06 DIAGNOSIS — E039 Hypothyroidism, unspecified: Secondary | ICD-10-CM | POA: Diagnosis not present

## 2014-09-06 DIAGNOSIS — I63311 Cerebral infarction due to thrombosis of right middle cerebral artery: Secondary | ICD-10-CM | POA: Diagnosis not present

## 2014-09-06 DIAGNOSIS — D509 Iron deficiency anemia, unspecified: Secondary | ICD-10-CM

## 2014-09-06 DIAGNOSIS — E1165 Type 2 diabetes mellitus with hyperglycemia: Secondary | ICD-10-CM

## 2014-09-06 DIAGNOSIS — G250 Essential tremor: Secondary | ICD-10-CM

## 2014-09-06 DIAGNOSIS — F0390 Unspecified dementia without behavioral disturbance: Secondary | ICD-10-CM

## 2014-09-06 DIAGNOSIS — E119 Type 2 diabetes mellitus without complications: Secondary | ICD-10-CM | POA: Diagnosis not present

## 2014-09-06 DIAGNOSIS — N390 Urinary tract infection, site not specified: Secondary | ICD-10-CM

## 2014-09-06 DIAGNOSIS — IMO0002 Reserved for concepts with insufficient information to code with codable children: Secondary | ICD-10-CM

## 2014-09-06 DIAGNOSIS — I6330 Cerebral infarction due to thrombosis of unspecified cerebral artery: Secondary | ICD-10-CM

## 2014-09-06 DIAGNOSIS — F419 Anxiety disorder, unspecified: Secondary | ICD-10-CM | POA: Diagnosis not present

## 2014-09-06 DIAGNOSIS — I10 Essential (primary) hypertension: Secondary | ICD-10-CM | POA: Diagnosis not present

## 2014-09-06 NOTE — Progress Notes (Signed)
Patient ID: [name], female   DOB: 19-Jan-1931, 81 y.o.   MRN: [id number]   Reason for Appointment:  follow-up   History of Present Illness   Diagnosis: Type 2 DIABETES MELITUS, long-standing  She has been on insulin for several years and most of the time has been on NPH or premixed  insulin for convenience and simplicity In 2725 because of hypoglycemia overnight her evening insulin was stopped Probably because of decreased intake and weight loss her insulin requirement has been progressively less  Recent history:    She had an episode of severe hypoglycemia requiring hospitalization admission when she had her CVA. Since her discharge to the nursing home she has been taking only as needed Novolog insulin at mealtimes for blood sugars over 120 Prandin was ordered on her last visit to the office but this does not appear on her medication list now Previously had been taking 20 units of NPH insulin in the morning with good control as of March this year Her A1c tends to be falsely low previously Difficult to get a history since she is very hard of hearing  Current blood sugar patterns and issues: Difficult to decipher the blood sugar readings from the nursing home  Her fasting blood sugars are mildly increased and did not appear to be over 200  Blood sugars are generally higher at lunchtime with 7 readings over 200 and a few over 300 but blood sugars are mostly illegible  Blood sugars are mostly significantly high at 4:30 PM even though she is getting some correction doses at lunchtime  No blood sugars are being checked after supper She thinks she is eating fairly normally  Unable to check her weight since she is in a wheelchair today  Hypoglycemia: None  Insulin regimen: Novolog 1-9 units at mealtimes based on blood sugar level           Monitors blood glucose:  2.5 times a day.    Glucometer: One Touch.          Blood Glucose reading analysis from nursing home record  Mean  values apply above for all meters except median for One Touch  PRE-MEAL Fasting Lunch Dinner Bedtime Overall  Glucose range:  139-192   187-351   156-400     Mean/median:         Meals:  2-3 meals per day       Wt Readings from Last 3 Encounters:  08/30/14 160 lb 9.6 oz (72.848 kg)  08/04/14 176 lb 12.9 oz (80.2 kg)  07/10/14 177 lb (80.287 kg)   Lab Results  Component Value Date   HGBA1C 5.4 07/05/2014   HGBA1C 5.1 04/18/2014   HGBA1C 6.0 12/21/2013   Lab Results  Component Value Date   MICROALBUR 5.1* 04/18/2014   LDLCALC 55 07/07/2014   CREATININE 1.01* 08/05/2014        Medication List       This list is accurate as of: 09/06/14  1:37 PM.  Always use your most recent med list.               CALCIUM 600 600 MG Tabs tablet  Generic drug:  calcium carbonate  Take 600 mg by mouth 2 (two) times daily with a meal.     clopidogrel 75 MG tablet  Commonly known as:  PLAVIX  Take 1 tablet (75 mg total) by mouth daily.     cyanocobalamin 1000 MCG tablet  Take 1 tablet (1,000 mcg total) by mouth  daily.     ferrous sulfate 325 (65 FE) MG tablet  Take 325 mg by mouth 2 (two) times daily with a meal.     HYDROcodone-acetaminophen 10-325 MG per tablet  Commonly known as:  NORCO  Take 1 tablet by mouth 2 (two) times daily.     insulin aspart 100 UNIT/ML injection  Commonly known as:  novoLOG  Inject 0-9 Units into the skin 3 (three) times daily with meals.     insulin lispro 100 UNIT/ML injection  Commonly known as:  HUMALOG  Inject 0-9 Units into the skin 3 (three) times daily before meals. Sliding scale: 121-150 = 1 unit, 151-200 = 2 units, 201-250 = 3 units, 251-300 = 5 units, 301-350 = 7 units, 351-400 = 9 units     levothyroxine 100 MCG tablet  Commonly known as:  SYNTHROID, LEVOTHROID  Take 100 mcg by mouth daily before breakfast.     LORazepam 0.5 MG tablet  Commonly known as:  ATIVAN  Take 0.5 mg by mouth 3 (three) times daily. Take 1/2 tab = 0.25 mg  PO TID     PARoxetine 10 MG tablet  Commonly known as:  PAXIL  Take 10 mg by mouth daily.     rOPINIRole 3 MG tablet  Commonly known as:  REQUIP  Take 3 mg by mouth at bedtime.     vitamin C 1000 MG tablet  Take 1,000 mg by mouth daily.     Vitamin D 2000 UNITS tablet  Take 2,000 Units by mouth daily.        Allergies:  Allergies  Allergen Reactions  . Aspirin Hives  . Penicillins Hives    Past Medical History  Diagnosis Date  . Hemorrhoid   . Anemia   . Arthritis   . Stroke     Past Surgical History  Procedure Laterality Date  . Breast lumpectomy      LEFT  . Abdominal hysterectomy      PARTIAL  . Mastectomy  05/2008    Left    No family history on file.  Social History:  reports that she has never smoked. She does not have any smokeless tobacco history on file. She reports that she does not drink alcohol or use illicit drugs.  Review of Systems:  HYPERTENSION: This is long-standing and previously was on indapamide and losartan Since her hospital discharge she has only been on Toprol-XL 50 mg daily  HYPOTHYROIDISM: She has had long-standing hypothyroidism    Her PCP at the nursing home has increased her dose to 112 g of levothyroxine on 08/31/14 with her TSH being persistently high just over 7.0 Not clear if this has been started as her medication list still indicate she is getting 100 g   Lab Results  Component Value Date   TSH 4.26 04/18/2014   TSH 1.19 09/21/2013   She has had chronic bilateral leg weakness and is in a wheelchair  No history of hypercholesterolemia   Lab Results  Component Value Date   CHOL 110 07/07/2014   HDL 37* 07/07/2014   LDLCALC 55 07/07/2014   TRIG 89 07/07/2014   CHOLHDL 3.0 07/07/2014    History of breast cancer, followed by oncologist Also followed for anemia by oncologist and is going to be seen every 2 months now    Foot exam in 6/15 showed normal monofilament sensation, absent pedal pulses, no  deformities or calluses    She has anemia followed by hematologist  Examination:   BP 122/76 mmHg  Pulse 77  Temp(Src) 99.1 F (37.3 C) (Oral)  Resp 14  SpO2 97%  There is no weight on file to calculate BMI.    Exam not indicated  ASSESSMENT/ PLAN:   Diabetes type 2:   Her blood sugars are now getting progressively higher especially at lunch and suppertime and no readings are being done in the evenings She is not responding to as needed Novolog insulin in low doses and is probably getting at least 6-10 units of insulin in a day without adequate control No hypoglycemia Since she is eating fairly normally will need to have her start back on NPH insulin which had worked well before  For now she will start with 8 units and this will be titrated further Will request that she have her blood sugars faxed in 2 weeks  HYPOTHYROIDISM TSH was slightly high recently.  Not clear of her dose has been increased by nursing home to 112 g as ordered by her PCP Thyroid level to be checked again for her hypothyroidism in another 6 weeks   RENAL insufficiency: Her creatinine has been followed periodically by PCP  [facility] 09/06/2014, 1:37 PM

## 2014-09-06 NOTE — Progress Notes (Signed)
Patient ID: [name], female   DOB: 1930/03/23, 81 y.o.   MRN: [id number]   09/06/14  Facility:  Nursing Home Location:  [place] Room Number: 0998-3 LEVEL OF CARE:  SNF (31)   Chief Complaint  Patient presents with  . Discharge Note    CVA, hypertension, diabetes mellitus, essential tremor, anxiety, anemia, dementia, UTI and hypothyroidism    HISTORY OF PRESENT ILLNESS:  This is an 81 year old female who is for discharge home with Home health PT, OT, ST, Nursing and Home health aide. DME:  Standard wheelchair 16' X 18" with elevating leg rests, anti-tippers, wheelchair cushion, sliding board (30") and semi-electric hospital bed with gel overlay. She has been admitted to [facility] on 07/09/14 from [facility]. She has PMH of hypertension, diabetes mellitus type 2, prior CVA, breast cancer status post left breast mastectomy and TIA. She presented to the ED with slurred speech and confusion. CT head shows no acute infarct but presence of chronic left occipital/temporal and bilateral basal ganglia infarcts. She was not administered with TPA secondary to outside IV TPA window. She was started on Plavix 75 mg daily.  She had a recent transfusion of 2 units Packed RBC due to severe anemia, hgb 6.5. Latest hgb is 9.8. Synthroid dosage increased to 112 mcg daily due to tsh 7.442, elevated. She had a follow-up consult with Dr. Dwyane Dee, endocrinologist, and was started on Humulin- N  8 units @ breakfast.   Patient was admitted to this facility for short-term rehabilitation after the patient's recent hospitalization.  Patient has completed SNF rehabilitation and therapy has cleared the patient for discharge.  PAST MEDICAL HISTORY:  Past Medical History  Diagnosis Date  . Hemorrhoid   . Anemia   . Arthritis   . Stroke     CURRENT MEDICATIONS: Reviewed per MAR/see medication list    Medication List       This list is accurate as of: 09/06/14  7:35  PM.  Always use your most recent med list.               CALCIUM 600 600 MG Tabs tablet  Generic drug:  calcium carbonate  Take 600 mg by mouth 2 (two) times daily with a meal.     clopidogrel 75 MG tablet  Commonly known as:  PLAVIX  Take 1 tablet (75 mg total) by mouth daily.     cyanocobalamin 1000 MCG tablet  Take 1 tablet (1,000 mcg total) by mouth daily.     ferrous sulfate 325 (65 FE) MG tablet  Take 325 mg by mouth 2 (two) times daily with a meal.     HYDROcodone-acetaminophen 10-325 MG per tablet  Commonly known as:  NORCO  Take 1 tablet by mouth 2 (two) times daily.     HYDROcodone-acetaminophen 10-325 MG per tablet  Commonly known as:  NORCO  Take 1 tablet by mouth every 6 (six) hours as needed.     insulin lispro 100 UNIT/ML injection  Commonly known as:  HUMALOG  Inject 0-9 Units into the skin 3 (three) times daily. Sliding scale: 121-150 = 1 unit, 151-200 = 2 units, 201-250 = 3 units, 251-300 = 5 units, 301-350 = 7 units, 351-400 = 9 units   Do not use SSI for blood sugars <250 for breakfast     insulin NPH Human 100 UNIT/ML injection  Commonly known as:  HUMULIN N,NOVOLIN N  Inject 8 Units into the skin daily before breakfast.  No SSI for breakfast for  Blood sugars < 250     levothyroxine 112 MCG tablet  Commonly known as:  SYNTHROID, LEVOTHROID  Take 112 mcg by mouth daily before breakfast.     LORazepam 0.5 MG tablet  Commonly known as:  ATIVAN  Take 0.5 mg by mouth 3 (three) times daily. Take 1/2 tab = 0.25 mg PO TID     nitrofurantoin (macrocrystal-monohydrate) 100 MG capsule  Commonly known as:  MACROBID  Take 100 mg by mouth 2 (two) times daily.     PARoxetine 10 MG tablet  Commonly known as:  PAXIL  Take 10 mg by mouth daily.     rOPINIRole 3 MG tablet  Commonly known as:  REQUIP  Take 3 mg by mouth at bedtime.     saccharomyces boulardii 250 MG capsule  Commonly known as:  FLORASTOR  Take 250 mg by mouth 2 (two) times daily.      vitamin C 1000 MG tablet  Take 1,000 mg by mouth daily.     Vitamin D 2000 UNITS tablet  Take 2,000 Units by mouth daily.         Allergies  Allergen Reactions  . Aspirin Hives  . Penicillins Hives     REVIEW OF SYSTEMS:  GENERAL: no fever, chills or weakness RESPIRATORY: no cough, SOB, DOE, wheezing, hemoptysis CARDIAC: no chest pain, edema or palpitations GI: no abdominal pain, diarrhea, constipation, heart burn, nausea or vomiting  PHYSICAL EXAMINATION  GENERAL: no acute distress, normal body habitus SKIN:  Right heel has DTI, unstageable EYES: conjunctivae normal, sclerae normal, normal eye lids NECK: supple, trachea midline, no neck masses, no thyroid tenderness, no thyromegaly LYMPHATICS: no LAN in the neck, no supraclavicular LAN RESPIRATORY: breathing is even & unlabored, BS CTAB CARDIAC: RRR, no murmur,no extra heart sounds, no edema GI: abdomen soft, normal BS, no masses, no tenderness, no hepatomegaly, no splenomegaly EXTREMITIES: RLE with AFO; LLE with floatation boot and knee brace; left hemiparesis PSYCHIATRIC: the patient is alert & oriented to self, affect & behavior appropriate  LABS/RADIOLOGY: Labs reviewed: 09/06/14  Urine culture shows > 100,000 colonies/ml E. Coli  08/30/14  tsh 7.442 08/09/14  WBC 9.6 hemoglobin 9.8 hematocrit 30.6 MCV 77.1 platelet count 297 08/06/14  WBC 6.5 hemoglobin 9.1 hematocrit 28.9 MCV 76.9 platelet count 266 sodium 139 potassium 4.2 glucose 146 BUN 77 creatinine 0.88 calcium 9.6 08/03/14  WBC 8.3 hemoglobin 6.5 hematocrit 20.5 MCV 75.6 platelet count 239 07/26/14  WBC 9.4 hemoglobin 7.6 hematocrit 24.2 MCV 77.1 platelet count 380 07/17/14  WBC 8.8 hemoglobin 8.1 hematocrit 26.0 MCV 77.6 platelet count 382 sodium 140 potassium 4.6 glucose 163 BUN 31 creatinine 1.07 calcium 9.3 tsh 4.431 Basic Metabolic Panel:  Recent Labs  07/08/14 1020 08/03/14 1810 08/03/14 1823 08/05/14 0515  NA 137 137 136 137  K 3.8 3.5 3.4* 3.9    CL 102 99* 96* 100*  CO2 27 30  --  28  GLUCOSE 128* 164* 162* 144*  BUN 23* 38* 36* 27*  CREATININE 1.52* 1.35* 1.30* 1.01*  CALCIUM 8.6* 9.4  --  9.3   Liver Function Tests:  Recent Labs  04/18/14 1539 07/05/14 2216 07/08/14 1020  AST 15 23 18   ALT 7 9* 9*  ALKPHOS 79 58 58  BILITOT 0.5 0.5 0.8  PROT 6.6 6.0* 6.6  ALBUMIN 3.3* 2.9* 2.7*   CBC:  Recent Labs  07/05/14 2216  08/03/14 1810  08/04/14 1440 08/05/14 0515 08/09/14 1405  WBC 9.8  --  10.0  < > 7.7 8.2 9.6  NEUTROABS 7.8*  --  8.1*  --   --   --  7.6*  HGB 10.4*  < > 7.6*  < > 8.8* 9.4* 9.8*  HCT 32.7*  < > 23.6*  < > 26.8* 29.1* 30.6*  MCV 76.9*  --  74.4*  < > 74.7* 75.2* 77.1*  PLT 265  --  266  < > 233 258 297  < > = values in this interval not displayed. Lipid Panel:  Recent Labs  04/18/14 1539 07/07/14 0500  HDL 35.90* 37*   CBG:  Recent Labs  08/05/14 0747 08/05/14 1153 08/05/14 1632  GLUCAP 132* 198* 156*     No results found.  ASSESSMENT/PLAN:  CVA - for Home health PT, OT, ST, Nursing and Home health aide ; continue Plavix 75 mg by mouth daily; follow-up with Dr. Leonie Man, neurology  Hypertension - continue Lozol 1.25 mg by mouth every morning and metoprolol 50 mg 24 hour 1 tab by mouth daily  Diabetes mellitus, type II - hemoglobin A1c 5.3; recently started on Humulin N 8 units SQ @ btreakfast continue NovoLog sliding scale subcutaneous 3 times a day with meals no SSI Humalog for CBG <250  Essential tremor - continue Requip 3 mg by mouth daily at bedtime  Anemia, iron deficiency - hemoglobin 9.8; S/P transfusion of 2 units packed RBC; continue ferrous sulfate 325 mg 1 tab by mouth twice a day  Hypothyroidism -  tsh 7.346; recently increased Synthroid  To 112 g by mouth daily; for repeat tsh in 6 weeks  Anxiety - mood is stable; continue Paxil 10 mg 1 tab PO daily and Ativan 0.25 mg PO TID  Dementia - advanced; continue supportive care  UTI - start Macrobid 100 mg PO BID X  7 days and Florastor 250 mg 1 capsule PO BID X 10 days      I have filled out patient's discharge paperwork and written prescriptions.  Patient will receive home health PT, OT, ST, Nursing and Home health aide.  DME provided:  Standard wheelchair 16' X 18" with elevating leg rests, anti-tippers, wheelchair cushion, sliding board (30") and semi-electric hospital bed with gel overlay  Total discharge time: Greater than 30 minutes  Discharge time involved coordination of the discharge process with social worker, nursing staff and therapy department. Medical justification for home health services/DME verified.     [facility], NP [employer] [phone]

## 2014-09-06 NOTE — Patient Instructions (Signed)
Did not give sliding scale insulin unless blood sugar over 250 Start NPH insulin 8 units in the morning daily  Please fax blood sugar records legibly in 2 weeks to my fax number at [phone]

## 2014-09-07 ENCOUNTER — Encounter: Payer: Self-pay | Admitting: *Deleted

## 2014-09-11 ENCOUNTER — Ambulatory Visit: Payer: Medicare Other | Admitting: Endocrinology

## 2014-09-11 DIAGNOSIS — I129 Hypertensive chronic kidney disease with stage 1 through stage 4 chronic kidney disease, or unspecified chronic kidney disease: Secondary | ICD-10-CM | POA: Diagnosis not present

## 2014-09-11 DIAGNOSIS — E1122 Type 2 diabetes mellitus with diabetic chronic kidney disease: Secondary | ICD-10-CM | POA: Diagnosis not present

## 2014-09-13 ENCOUNTER — Ambulatory Visit: Payer: Medicare Other

## 2014-09-13 ENCOUNTER — Other Ambulatory Visit: Payer: Medicare Other

## 2014-09-14 DIAGNOSIS — I129 Hypertensive chronic kidney disease with stage 1 through stage 4 chronic kidney disease, or unspecified chronic kidney disease: Secondary | ICD-10-CM | POA: Diagnosis not present

## 2014-09-14 DIAGNOSIS — E1122 Type 2 diabetes mellitus with diabetic chronic kidney disease: Secondary | ICD-10-CM | POA: Diagnosis not present

## 2014-09-16 ENCOUNTER — Encounter (HOSPITAL_COMMUNITY): Payer: Self-pay | Admitting: Emergency Medicine

## 2014-09-16 ENCOUNTER — Inpatient Hospital Stay (HOSPITAL_COMMUNITY)
Admission: EM | Admit: 2014-09-16 | Discharge: 2014-09-20 | DRG: 690 | Disposition: A | Discharge status: Skilled Nursing Facility | Payer: Medicare Other | Attending: Internal Medicine | Admitting: Internal Medicine

## 2014-09-16 DIAGNOSIS — N3000 Acute cystitis without hematuria: Secondary | ICD-10-CM | POA: Diagnosis not present

## 2014-09-16 DIAGNOSIS — Z880 Allergy status to penicillin: Secondary | ICD-10-CM

## 2014-09-16 DIAGNOSIS — N390 Urinary tract infection, site not specified: Principal | ICD-10-CM | POA: Insufficient documentation

## 2014-09-16 DIAGNOSIS — I639 Cerebral infarction, unspecified: Secondary | ICD-10-CM | POA: Diagnosis present

## 2014-09-16 DIAGNOSIS — B9620 Unspecified Escherichia coli [E. coli] as the cause of diseases classified elsewhere: Secondary | ICD-10-CM | POA: Diagnosis present

## 2014-09-16 DIAGNOSIS — Z79891 Long term (current) use of opiate analgesic: Secondary | ICD-10-CM

## 2014-09-16 DIAGNOSIS — R410 Disorientation, unspecified: Secondary | ICD-10-CM | POA: Diagnosis not present

## 2014-09-16 DIAGNOSIS — E11649 Type 2 diabetes mellitus with hypoglycemia without coma: Secondary | ICD-10-CM | POA: Diagnosis present

## 2014-09-16 DIAGNOSIS — E876 Hypokalemia: Secondary | ICD-10-CM | POA: Diagnosis not present

## 2014-09-16 DIAGNOSIS — I638 Other cerebral infarction: Secondary | ICD-10-CM | POA: Diagnosis not present

## 2014-09-16 DIAGNOSIS — F419 Anxiety disorder, unspecified: Secondary | ICD-10-CM | POA: Diagnosis not present

## 2014-09-16 DIAGNOSIS — E1122 Type 2 diabetes mellitus with diabetic chronic kidney disease: Secondary | ICD-10-CM | POA: Diagnosis not present

## 2014-09-16 DIAGNOSIS — D631 Anemia in chronic kidney disease: Secondary | ICD-10-CM | POA: Diagnosis not present

## 2014-09-16 DIAGNOSIS — E119 Type 2 diabetes mellitus without complications: Secondary | ICD-10-CM

## 2014-09-16 DIAGNOSIS — E860 Dehydration: Secondary | ICD-10-CM | POA: Diagnosis present

## 2014-09-16 DIAGNOSIS — D649 Anemia, unspecified: Secondary | ICD-10-CM

## 2014-09-16 DIAGNOSIS — D591 Other autoimmune hemolytic anemias: Secondary | ICD-10-CM | POA: Diagnosis not present

## 2014-09-16 DIAGNOSIS — M1712 Unilateral primary osteoarthritis, left knee: Secondary | ICD-10-CM | POA: Diagnosis not present

## 2014-09-16 DIAGNOSIS — Z886 Allergy status to analgesic agent status: Secondary | ICD-10-CM

## 2014-09-16 DIAGNOSIS — Z794 Long term (current) use of insulin: Secondary | ICD-10-CM

## 2014-09-16 DIAGNOSIS — N183 Chronic kidney disease, stage 3 unspecified: Secondary | ICD-10-CM | POA: Diagnosis present

## 2014-09-16 DIAGNOSIS — I129 Hypertensive chronic kidney disease with stage 1 through stage 4 chronic kidney disease, or unspecified chronic kidney disease: Secondary | ICD-10-CM | POA: Diagnosis present

## 2014-09-16 DIAGNOSIS — R52 Pain, unspecified: Secondary | ICD-10-CM

## 2014-09-16 DIAGNOSIS — E162 Hypoglycemia, unspecified: Secondary | ICD-10-CM

## 2014-09-16 DIAGNOSIS — R1311 Dysphagia, oral phase: Secondary | ICD-10-CM | POA: Diagnosis not present

## 2014-09-16 DIAGNOSIS — F0390 Unspecified dementia without behavioral disturbance: Secondary | ICD-10-CM | POA: Diagnosis not present

## 2014-09-16 DIAGNOSIS — N189 Chronic kidney disease, unspecified: Secondary | ICD-10-CM

## 2014-09-16 DIAGNOSIS — Z7902 Long term (current) use of antithrombotics/antiplatelets: Secondary | ICD-10-CM | POA: Diagnosis not present

## 2014-09-16 DIAGNOSIS — M1990 Unspecified osteoarthritis, unspecified site: Secondary | ICD-10-CM | POA: Diagnosis present

## 2014-09-16 DIAGNOSIS — R4182 Altered mental status, unspecified: Secondary | ICD-10-CM

## 2014-09-16 DIAGNOSIS — I10 Essential (primary) hypertension: Secondary | ICD-10-CM | POA: Diagnosis not present

## 2014-09-16 DIAGNOSIS — E161 Other hypoglycemia: Secondary | ICD-10-CM | POA: Diagnosis not present

## 2014-09-16 DIAGNOSIS — Z9012 Acquired absence of left breast and nipple: Secondary | ICD-10-CM | POA: Diagnosis present

## 2014-09-16 DIAGNOSIS — E039 Hypothyroidism, unspecified: Secondary | ICD-10-CM | POA: Diagnosis present

## 2014-09-16 DIAGNOSIS — M6281 Muscle weakness (generalized): Secondary | ICD-10-CM | POA: Diagnosis not present

## 2014-09-16 DIAGNOSIS — E038 Other specified hypothyroidism: Secondary | ICD-10-CM | POA: Diagnosis not present

## 2014-09-16 DIAGNOSIS — M1612 Unilateral primary osteoarthritis, left hip: Secondary | ICD-10-CM | POA: Diagnosis not present

## 2014-09-16 DIAGNOSIS — L89620 Pressure ulcer of left heel, unstageable: Secondary | ICD-10-CM | POA: Insufficient documentation

## 2014-09-16 DIAGNOSIS — L89619 Pressure ulcer of right heel, unspecified stage: Secondary | ICD-10-CM | POA: Diagnosis present

## 2014-09-16 DIAGNOSIS — Z79899 Other long term (current) drug therapy: Secondary | ICD-10-CM

## 2014-09-16 DIAGNOSIS — E034 Atrophy of thyroid (acquired): Secondary | ICD-10-CM | POA: Diagnosis not present

## 2014-09-16 DIAGNOSIS — I69354 Hemiplegia and hemiparesis following cerebral infarction affecting left non-dominant side: Secondary | ICD-10-CM

## 2014-09-16 DIAGNOSIS — E559 Vitamin D deficiency, unspecified: Secondary | ICD-10-CM | POA: Diagnosis not present

## 2014-09-16 DIAGNOSIS — I6330 Cerebral infarction due to thrombosis of unspecified cerebral artery: Secondary | ICD-10-CM | POA: Diagnosis not present

## 2014-09-16 DIAGNOSIS — Z7901 Long term (current) use of anticoagulants: Secondary | ICD-10-CM | POA: Diagnosis not present

## 2014-09-16 LAB — CBC
HEMATOCRIT: 23.3 % — AB (ref 36.0–46.0)
Hemoglobin: 7.1 g/dL — ABNORMAL LOW (ref 12.0–15.0)
MCH: 23.1 pg — AB (ref 26.0–34.0)
MCHC: 30.5 g/dL (ref 30.0–36.0)
MCV: 75.9 fL — ABNORMAL LOW (ref 78.0–100.0)
PLATELETS: 338 10*3/uL (ref 150–400)
RBC: 3.07 MIL/uL — ABNORMAL LOW (ref 3.87–5.11)
RDW: 16.4 % — ABNORMAL HIGH (ref 11.5–15.5)
WBC: 7.9 10*3/uL (ref 4.0–10.5)

## 2014-09-16 LAB — CBG MONITORING, ED
GLUCOSE-CAPILLARY: 85 mg/dL (ref 65–99)
Glucose-Capillary: 107 mg/dL — ABNORMAL HIGH (ref 65–99)

## 2014-09-16 LAB — COMPREHENSIVE METABOLIC PANEL
ALT: 10 U/L — ABNORMAL LOW (ref 14–54)
AST: 17 U/L (ref 15–41)
Albumin: 2.3 g/dL — ABNORMAL LOW (ref 3.5–5.0)
Alkaline Phosphatase: 65 U/L (ref 38–126)
Anion gap: 6 (ref 5–15)
BILIRUBIN TOTAL: 0.6 mg/dL (ref 0.3–1.2)
BUN: 22 mg/dL — ABNORMAL HIGH (ref 6–20)
CHLORIDE: 99 mmol/L — AB (ref 101–111)
CO2: 30 mmol/L (ref 22–32)
CREATININE: 0.94 mg/dL (ref 0.44–1.00)
Calcium: 8.8 mg/dL — ABNORMAL LOW (ref 8.9–10.3)
GFR calc non Af Amer: 55 mL/min — ABNORMAL LOW (ref >60)
Glucose, Bld: 90 mg/dL (ref 65–99)
POTASSIUM: 3.2 mmol/L — AB (ref 3.5–5.1)
Sodium: 135 mmol/L (ref 135–145)
Total Protein: 6.5 g/dL (ref 6.5–8.1)

## 2014-09-16 LAB — URINALYSIS, ROUTINE W REFLEX MICROSCOPIC
GLUCOSE, UA: NEGATIVE mg/dL
Ketones, ur: NEGATIVE mg/dL
NITRITE: NEGATIVE
PROTEIN: 30 mg/dL — AB
Specific Gravity, Urine: 1.018 (ref 1.005–1.030)
Urobilinogen, UA: 1 mg/dL (ref 0.0–1.0)
pH: 5.5 (ref 5.0–8.0)

## 2014-09-16 LAB — URINE MICROSCOPIC-ADD ON

## 2014-09-16 LAB — POC OCCULT BLOOD, ED: Fecal Occult Bld: NEGATIVE

## 2014-09-16 MED ORDER — SODIUM CHLORIDE 0.9 % IV BOLUS (SEPSIS): 500.0000 mL | Freq: Once | INTRAVENOUS | Status: DC

## 2014-09-16 MED ORDER — FUROSEMIDE 10 MG/ML IJ SOLN
20.0000 mg | Freq: Once | INTRAMUSCULAR | Status: AC
  Administered 2014-09-17: 20 mg via INTRAVENOUS
  Filled 2014-09-16: qty 2

## 2014-09-16 MED ORDER — ACETAMINOPHEN 325 MG PO TABS
650.0000 mg | ORAL_TABLET | Freq: Once | ORAL | Status: AC
  Administered 2014-09-17: 650 mg via ORAL
  Filled 2014-09-16: qty 2

## 2014-09-16 MED ORDER — SODIUM CHLORIDE 0.9 % IV SOLN
INTRAVENOUS | Status: DC
  Administered 2014-09-16: 20 mL/h via INTRAVENOUS

## 2014-09-16 MED ORDER — DEXTROSE 5 % IV SOLN
1.0000 g | Freq: Once | INTRAVENOUS | Status: AC
  Administered 2014-09-16: 1 g via INTRAVENOUS
  Filled 2014-09-16: qty 10

## 2014-09-16 MED ORDER — SODIUM CHLORIDE 0.9 % IV SOLN: INTRAVENOUS | Status: AC

## 2014-09-16 MED ORDER — INSULIN ASPART 100 UNIT/ML ~~LOC~~ SOLN
0.0000 [IU] | SUBCUTANEOUS | Status: DC
  Administered 2014-09-17 – 2014-09-19 (×4): 1 [IU] via SUBCUTANEOUS

## 2014-09-16 MED ORDER — SODIUM CHLORIDE 0.9 % IV SOLN: Freq: Once | INTRAVENOUS | Status: DC

## 2014-09-16 NOTE — ED Notes (Signed)
MD at bedside. 

## 2014-09-16 NOTE — ED Provider Notes (Signed)
CSN: [id number]     Arrival date & time 09/16/14  2054 History   First MD Initiated Contact with Patient 09/16/14 2110     Chief Complaint  Patient presents with  . Hypoglycemia     (Consider location/radiation/quality/duration/timing/severity/associated sxs/prior Treatment) Patient is a 81 y.o. female presenting with hypoglycemia. The history is provided by the patient.  Hypoglycemia Associated symptoms: no shortness of breath and no vomiting   Patient w hx iddm, presents via ems w altered mental status, and was found by ems to have blood sugar in 50's. Was given insulin tonight. ?ate less than normal for dinner tonight. No nausea or vomiting. No diarrhea. Recent tx for uti - urine looks very cloudy. No dysuria. Pt denies headache. No chest pain. No cough or uri c/o. No sob. No abd pain. Pt denies any new unilateral numbness or weakness. No change in speech or vision.  Pt unaware of any recent change in medications.     Past Medical History  Diagnosis Date  . Hemorrhoid   . Anemia   . Arthritis   . Stroke    Past Surgical History  Procedure Laterality Date  . Breast lumpectomy      LEFT  . Abdominal hysterectomy      PARTIAL  . Mastectomy  05/2008    Left   History reviewed. No pertinent family history. Social History  Substance Use Topics  . Smoking status: Never Smoker   . Smokeless tobacco: None  . Alcohol Use: No   OB History    No data available     Review of Systems  Constitutional: Negative for fever and chills.  HENT: Negative for sore throat.   Eyes: Negative for redness.  Respiratory: Negative for cough and shortness of breath.   Cardiovascular: Negative for chest pain.  Gastrointestinal: Negative for vomiting, abdominal pain and diarrhea.  Genitourinary: Negative for dysuria and flank pain.  Musculoskeletal: Negative for back pain and neck pain.  Skin: Negative for rash.  Neurological: Negative for headaches.  Hematological: Does not bruise/bleed  easily.  Psychiatric/Behavioral: Positive for confusion.      Allergies  Aspirin and Penicillins  Home Medications   Prior to Admission medications   Medication Sig Start Date End Date Taking? Authorizing Provider  Ascorbic Acid (VITAMIN C) 1000 MG tablet Take 1,000 mg by mouth daily.    Historical Provider, MD  calcium carbonate (CALCIUM 600) 600 MG TABS tablet Take 600 mg by mouth 2 (two) times daily with a meal.    Historical Provider, MD  Cholecalciferol (VITAMIN D) 2000 UNITS tablet Take 2,000 Units by mouth daily.    Historical Provider, MD  clopidogrel (PLAVIX) 75 MG tablet Take 1 tablet (75 mg total) by mouth daily. 07/09/14   Geradine Girt, DO  ferrous sulfate 325 (65 FE) MG tablet Take 325 mg by mouth 2 (two) times daily with a meal.    Historical Provider, MD  HYDROcodone-acetaminophen (NORCO) 10-325 MG per tablet Take 1 tablet by mouth 2 (two) times daily.    Historical Provider, MD  HYDROcodone-acetaminophen (NORCO) 10-325 MG per tablet Take 1 tablet by mouth every 6 (six) hours as needed.    Historical Provider, MD  insulin lispro (HUMALOG) 100 UNIT/ML injection Inject 0-9 Units into the skin 3 (three) times daily. Sliding scale: 121-150 = 1 unit, 151-200 = 2 units, 201-250 = 3 units, 251-300 = 5 units, 301-350 = 7 units, 351-400 = 9 units   Do not use SSI for blood sugars <  250 for breakfast    Historical Provider, MD  insulin NPH Human (HUMULIN N,NOVOLIN N) 100 UNIT/ML injection Inject 8 Units into the skin daily before breakfast. No SSI for breakfast for  Blood sugars < 250    Historical Provider, MD  levothyroxine (SYNTHROID, LEVOTHROID) 112 MCG tablet Take 112 mcg by mouth daily before breakfast.    Historical Provider, MD  LORazepam (ATIVAN) 0.5 MG tablet Take 0.5 mg by mouth 3 (three) times daily. Take 1/2 tab = 0.25 mg PO TID    Historical Provider, MD  PARoxetine (PAXIL) 10 MG tablet Take 10 mg by mouth daily.    Historical Provider, MD  rOPINIRole (REQUIP) 3 MG  tablet Take 3 mg by mouth at bedtime.  10/20/10   Historical Provider, MD  saccharomyces boulardii (FLORASTOR) 250 MG capsule Take 250 mg by mouth 2 (two) times daily. 09/06/14 09/16/14  Historical Provider, MD  vitamin B-12 1000 MCG tablet Take 1 tablet (1,000 mcg total) by mouth daily. 07/09/14   Jessica U Vann, DO   BP 121/53 mmHg  Pulse 74  Temp(Src) 98.5 F (36.9 C) (Oral)  Resp 16  SpO2 93% Physical Exam  Constitutional: She appears well-developed and well-nourished. No distress.  HENT:  Head: Atraumatic.  Mouth/Throat: Oropharynx is clear and moist.  Eyes: Conjunctivae are normal. Pupils are equal, round, and reactive to light. No scleral icterus.  Neck: Neck supple. No tracheal deviation present. No thyromegaly present.  No bruits.   Cardiovascular: Normal rate, regular rhythm, normal heart sounds and intact distal pulses.   No murmur heard. Pulmonary/Chest: Effort normal and breath sounds normal. No respiratory distress.  Abdominal: Soft. Normal appearance and bowel sounds are normal. She exhibits no distension. There is no tenderness.  Genitourinary:  No cva tenderness  Musculoskeletal: She exhibits no edema.  Neurological: She is alert. No cranial nerve deficit.  Alert, oriented to person, place. Speech clear/fluent. Motor intact bil, equal grips. sens grossly intact.   Skin: Skin is warm and dry. No rash noted. She is not diaphoretic.  Psychiatric: She has a normal mood and affect.  Nursing note and vitals reviewed.   ED Course  Procedures (including critical care time) Labs Review  Results for orders placed or performed during the hospital encounter of 09/16/14  CBC  Result Value Ref Range   WBC 7.9 4.0 - 10.5 K/uL   RBC 3.07 (L) 3.87 - 5.11 MIL/uL   Hemoglobin 7.1 (L) 12.0 - 15.0 g/dL   HCT 23.3 (L) 36.0 - 46.0 %   MCV 75.9 (L) 78.0 - 100.0 fL   MCH 23.1 (L) 26.0 - 34.0 pg   MCHC 30.5 30.0 - 36.0 g/dL   RDW 16.4 (H) 11.5 - 15.5 %   Platelets 338 150 - 400 K/uL   Comprehensive metabolic panel  Result Value Ref Range   Sodium 135 135 - 145 mmol/L   Potassium 3.2 (L) 3.5 - 5.1 mmol/L   Chloride 99 (L) 101 - 111 mmol/L   CO2 30 22 - 32 mmol/L   Glucose, Bld 90 65 - 99 mg/dL   BUN 22 (H) 6 - 20 mg/dL   Creatinine, Ser 0.94 0.44 - 1.00 mg/dL   Calcium 8.8 (L) 8.9 - 10.3 mg/dL   Total Protein 6.5 6.5 - 8.1 g/dL   Albumin 2.3 (L) 3.5 - 5.0 g/dL   AST 17 15 - 41 U/L   ALT 10 (L) 14 - 54 U/L   Alkaline Phosphatase 65 38 - 126 U/L  Total Bilirubin 0.6 0.3 - 1.2 mg/dL   GFR calc non Af Amer 55 (L) >60 mL/min   GFR calc Af Amer >60 >60 mL/min   Anion gap 6 5 - 15  Urinalysis, Routine w reflex microscopic (not at [facility])  Result Value Ref Range   Color, Urine AMBER (A) YELLOW   APPearance TURBID (A) CLEAR   Specific Gravity, Urine 1.018 1.005 - 1.030   pH 5.5 5.0 - 8.0   Glucose, UA NEGATIVE NEGATIVE mg/dL   Hgb urine dipstick LARGE (A) NEGATIVE   Bilirubin Urine SMALL (A) NEGATIVE   Ketones, ur NEGATIVE NEGATIVE mg/dL   Protein, ur 30 (A) NEGATIVE mg/dL   Urobilinogen, UA 1.0 0.0 - 1.0 mg/dL   Nitrite NEGATIVE NEGATIVE   Leukocytes, UA LARGE (A) NEGATIVE  Urine microscopic-add on  Result Value Ref Range   WBC, UA TOO NUMEROUS TO COUNT <3 WBC/hpf   Bacteria, UA MANY (A) RARE   Urine-Other FIELD OBSCURED BY WBC'S   CBG monitoring, ED  Result Value Ref Range   Glucose-Capillary 107 (H) 65 - 99 mg/dL  CBG monitoring, ED  Result Value Ref Range   Glucose-Capillary 85 65 - 99 mg/dL      I have personally reviewed and evaluated these  lab results as part of my medical decision-making.   EKG Interpretation   Date/Time:  Sunday September 16 2014 21:07:33 EDT Ventricular Rate:  67 PR Interval:  167 QRS Duration: 81 QT Interval:  421 QTC Calculation: 444 R Axis:   14 Text Interpretation:  Sinus rhythm Atrial premature complex Left  ventricular hypertrophy No significant change since last tracing Confirmed  by Ashok Cordia  MD, Lennette Bihari ([id number]) on  09/16/2014 9:13:37 PM      MDM   Iv ns. Labs.  Po fluids/meal.  Reviewed nursing notes and prior charts for additional history.   Ems gave d50, blood glucose 200's.  Po fluids, meal ordered.  bs back to 85.  Iv ns. ua positive. u cx. Rocephin iv.  hgb 7, stool sent for hemoccult.  Family notes hx anemia, and prior transfusion for same, but that hgb 7 lower than previous.  Given uti, altered ms w blood sugar in 50s, ?poor po intake, significant anemia -  Will admit.  Hospitalist consulted for admission.      Lajean Saver, MD 09/16/14 [phone]

## 2014-09-16 NOTE — ED Notes (Signed)
Per EMS pt's CBG in the field was 58, 1 amp of d50 given. After d50 pt became verbal and more awake.  Pt is A&O x 3 w/ intermittent confusion and repetitive questions.  Pt was given 3 units of humalin at 1800.  Pt finished a course of macrobid for a UTI 3 days ago.  Per EMS pt's family reports pt being lethargic and sleeping more than normal.

## 2014-09-16 NOTE — H&P (Addendum)
[facility] Admission History and Physical       [name] XLK:[id number] DOB: 1930-04-08 DOA: 09/16/2014  Referring physician: EDP PCP: Antony Blackbird, MD  Specialists:   Chief Complaint: Lethargy, Increased Confusion  HPI: [name] is a 81 y.o. female with a hisotry of A CVA with Left Hemiparesis, HTN, DM2, CKD stage III and  Anemia who was brought to the ED due to increased lethargy and confusion during the day and poor intake of foods and liquids.   EMS was called and on site found her glucose to be in the 50's , she was administered IV Dextrose and her glucose increased , but while in the ED her Glucose decreased again.    She was evaluated in the ED and also found to have a UTI, and a Urine culture was sent and she was administered IV Rocephin.  Of Note she took a 3 day course of Macrobid for a UTI recently.     Her Daughter is at the bedside and gives the history.      Review of Systems: Unable to Obtain from the Patient  Past Medical History  Diagnosis Date  . Hemorrhoid   . Anemia   . Arthritis   . Stroke      Past Surgical History  Procedure Laterality Date  . Breast lumpectomy      LEFT  . Abdominal hysterectomy      PARTIAL  . Mastectomy  05/2008    Left      Prior to Admission medications   Medication Sig Start Date End Date Taking? Authorizing Provider  Ascorbic Acid (VITAMIN C) 1000 MG tablet Take 1,000 mg by mouth daily.    Historical Provider, MD  calcium carbonate (CALCIUM 600) 600 MG TABS tablet Take 600 mg by mouth 2 (two) times daily with a meal.    Historical Provider, MD  Cholecalciferol (VITAMIN D) 2000 UNITS tablet Take 2,000 Units by mouth daily.    Historical Provider, MD  clopidogrel (PLAVIX) 75 MG tablet Take 1 tablet (75 mg total) by mouth daily. 07/09/14   Geradine Girt, DO  ferrous sulfate 325 (65 FE) MG tablet Take 325 mg by mouth 2 (two) times daily with a meal.    Historical Provider, MD  HYDROcodone-acetaminophen  (NORCO) 10-325 MG per tablet Take 1 tablet by mouth 2 (two) times daily.    Historical Provider, MD  HYDROcodone-acetaminophen (NORCO) 10-325 MG per tablet Take 1 tablet by mouth every 6 (six) hours as needed.    Historical Provider, MD  insulin lispro (HUMALOG) 100 UNIT/ML injection Inject 0-9 Units into the skin 3 (three) times daily. Sliding scale: 121-150 = 1 unit, 151-200 = 2 units, 201-250 = 3 units, 251-300 = 5 units, 301-350 = 7 units, 351-400 = 9 units   Do not use SSI for blood sugars <250 for breakfast    Historical Provider, MD  insulin NPH Human (HUMULIN N,NOVOLIN N) 100 UNIT/ML injection Inject 8 Units into the skin daily before breakfast. No SSI for breakfast for  Blood sugars < 250    Historical Provider, MD  levothyroxine (SYNTHROID, LEVOTHROID) 112 MCG tablet Take 112 mcg by mouth daily before breakfast.    Historical Provider, MD  LORazepam (ATIVAN) 0.5 MG tablet Take 0.5 mg by mouth 3 (three) times daily. Take 1/2 tab = 0.25 mg PO TID    Historical Provider, MD  PARoxetine (PAXIL) 10 MG tablet Take 10 mg by mouth daily.    Historical Provider, MD  rOPINIRole (REQUIP) 3 MG tablet Take 3 mg by mouth at bedtime.  10/20/10   Historical Provider, MD  saccharomyces boulardii (FLORASTOR) 250 MG capsule Take 250 mg by mouth 2 (two) times daily. 09/06/14 09/16/14  Historical Provider, MD  vitamin B-12 1000 MCG tablet Take 1 tablet (1,000 mcg total) by mouth daily. 07/09/14   Geradine Girt, DO     Allergies  Allergen Reactions  . Aspirin Hives  . Penicillins Hives    Social History:  reports that she has never smoked. She does not have any smokeless tobacco history on file. She reports that she does not drink alcohol or use illicit drugs.    History reviewed. No pertinent family history.     Physical Exam:  GEN:  Pleasant  81 y.o. female examined and in no acute distress; cooperative with exam Filed Vitals:   09/16/14 2110 09/16/14 2148 09/16/14 2155 09/16/14 2230  BP: 121/53  118/53 112/52 115/51  Pulse: 74 77  66  Temp: 98.5 F (36.9 C)     TempSrc: Oral     Resp: 16 18    Height:  5\' 8"  (1.727 m)    Weight:  74.844 kg (165 lb)    SpO2: 93% 96%  97%   Blood pressure 115/51, pulse 66, temperature 98.5 F (36.9 C), temperature source Oral, resp. rate 18, height 5\' 8"  (1.727 m), weight 74.844 kg (165 lb), SpO2 97 %. PSYCH: She is alert and oriented x 2; does not appear anxious does not appear depressed; affect is normal HEENT: Normocephalic and Atraumatic, Mucous membranes pink; PERRLA; EOM intact; Fundi:  Benign;  No scleral icterus, Nares: Patent, Oropharynx: Clear, Edentulous with Dentures Present,    Neck:  FROM, No Cervical Lymphadenopathy nor Thyromegaly or Carotid Bruit; No JVD; Breasts:: Not examined CHEST WALL: No tenderness CHEST: Normal respiration, clear to auscultation bilaterally HEART: Regular rate and rhythm; no murmurs rubs or gallops BACK: No kyphosis or scoliosis; No CVA tenderness ABDOMEN: Positive Bowel Sounds,Soft Non-Tender, No Rebound or Guarding; No Masses, No Organomegaly. Rectal Exam: Not done EXTREMITIES: NoCyanosis, Clubbing, or Edema; +Right Heel Pressure Ulcer. Genitalia: not examined PULSES: 2+ and symmetric SKIN: Normal hydration no rash or ulceration  CNS:  Alert and Oriented x 2, Left Hemiparesis Labs on Admission:  Basic Metabolic Panel:  Recent Labs Lab 09/16/14 2130  NA 135  K 3.2*  CL 99*  CO2 30  GLUCOSE 90  BUN 22*  CREATININE 0.94  CALCIUM 8.8*   Liver Function Tests:  Recent Labs Lab 09/16/14 2130  AST 17  ALT 10*  ALKPHOS 65  BILITOT 0.6  PROT 6.5  ALBUMIN 2.3*   No results for input(s): LIPASE, AMYLASE in the last 168 hours. No results for input(s): AMMONIA in the last 168 hours. CBC:  Recent Labs Lab 09/16/14 2130  WBC 7.9  HGB 7.1*  HCT 23.3*  MCV 75.9*  PLT 338   Cardiac Enzymes: No results for input(s): CKTOTAL, CKMB, CKMBINDEX, TROPONINI in the last 168 hours.  BNP (last  3 results) No results for input(s): BNP in the last 8760 hours.  ProBNP (last 3 results) No results for input(s): PROBNP in the last 8760 hours.  CBG:  Recent Labs Lab 09/16/14 2107 09/16/14 2213  GLUCAP 107* 85    Radiological Exams on Admission: No results found.   EKG: Independently reviewed. Normal Sinus Rhythm rate =67 No Acute Changes       Assessment/Plan:      81 y.o. female with  Principal Problem:   1.   Hypoglycemia   IV D5NSS Fluids   Monitor Glucose Levels every hour x 6, then every 4   Hold NPH Insulins   Encourage PO intake    Active Problems:   2.   Anemia in CKD (chronic kidney disease)   Send Anemia Panel   Send FOBT daily x 3   Transfuse 2 units    3.   Hypokalemia   Replace K+ in IVFs    Monitor K+   Check Magnesium Level   4.   UTI- Partially treated    Urine Culture sent   IV Rocephin     5.   Essential hypertension   PRN IV Hydralazine for SBP > 170    Monitor BPs    6.   CKD (chronic kidney disease) stage 3, GFR 30-59 ml/min   Monitor BUN/Cr     7.   DM2 (diabetes mellitus, type 2)   Hold NPH Insulin and Home SSI   SSI coverage PRN   Check HbA1C    8.   Hypothyroidism   Continue Levothyroxine Rx     9.   CVA (cerebral infarction)- chronic Deficits Left Hemiparesis   PT evaluation     10.  DVT Prophylaxis   Lovenox   11.  Pre-Hospital Right Heel Pressure Ulcer   Wound Care Eval for Rx    Code Status:     FULL CODE        Family Communication:   Daughter at Bedside     Disposition Plan:    Inpatient Status        Time spent:  [facility] Pager [phone]   If [place] Please Contact the Day Rounding Team MD for [facility]  If 7PM-7AM, Please Contact Night-Floor Coverage  [email or web address] Password [facility] 09/16/2014, 11:38 PM     ADDENDUM:   Patient was seen and examined on 09/16/2014

## 2014-09-16 NOTE — ED Notes (Signed)
Bed: RESB Expected date: 09/16/14 Expected time: 8:29 PM Means of arrival:  Comments: EMS 81y/o F, CBG 58, D50 CBG 237, IV w/ NS

## 2014-09-16 NOTE — ED Notes (Signed)
Pt given applesauce as she is on a mechanical soft diet since CVA 2 months ago.

## 2014-09-17 ENCOUNTER — Ambulatory Visit: Payer: Medicare Other | Admitting: Neurology

## 2014-09-17 ENCOUNTER — Encounter (HOSPITAL_COMMUNITY): Payer: Self-pay | Admitting: *Deleted

## 2014-09-17 DIAGNOSIS — N189 Chronic kidney disease, unspecified: Secondary | ICD-10-CM

## 2014-09-17 DIAGNOSIS — D631 Anemia in chronic kidney disease: Secondary | ICD-10-CM

## 2014-09-17 DIAGNOSIS — L89620 Pressure ulcer of left heel, unstageable: Secondary | ICD-10-CM | POA: Insufficient documentation

## 2014-09-17 DIAGNOSIS — N390 Urinary tract infection, site not specified: Secondary | ICD-10-CM | POA: Insufficient documentation

## 2014-09-17 LAB — IRON AND TIBC
Iron: 13 ug/dL — ABNORMAL LOW (ref 28–170)
Saturation Ratios: 11 % (ref 10.4–31.8)
TIBC: 113 ug/dL — ABNORMAL LOW (ref 250–450)
UIBC: 100 ug/dL

## 2014-09-17 LAB — HEMOGLOBIN AND HEMATOCRIT, BLOOD
HCT: 30.5 % — ABNORMAL LOW (ref 36.0–46.0)
Hemoglobin: 9.7 g/dL — ABNORMAL LOW (ref 12.0–15.0)

## 2014-09-17 LAB — GLUCOSE, CAPILLARY
GLUCOSE-CAPILLARY: 145 mg/dL — AB (ref 65–99)
GLUCOSE-CAPILLARY: 141 mg/dL — AB (ref 65–99)
Glucose-Capillary: 103 mg/dL — ABNORMAL HIGH (ref 65–99)
Glucose-Capillary: 104 mg/dL — ABNORMAL HIGH (ref 65–99)
Glucose-Capillary: 93 mg/dL (ref 65–99)
Glucose-Capillary: 98 mg/dL (ref 65–99)

## 2014-09-17 LAB — TSH: TSH: 6.52 u[IU]/mL — ABNORMAL HIGH (ref 0.350–4.500)

## 2014-09-17 LAB — RETICULOCYTES
RBC.: 3.25 MIL/uL — ABNORMAL LOW (ref 3.87–5.11)
Retic Count, Absolute: 71.5 10*3/uL (ref 19.0–186.0)
Retic Ct Pct: 2.2 % (ref 0.4–3.1)

## 2014-09-17 LAB — CBC
HCT: 25 % — ABNORMAL LOW (ref 36.0–46.0)
HEMOGLOBIN: 7.7 g/dL — AB (ref 12.0–15.0)
MCH: 23.7 pg — AB (ref 26.0–34.0)
MCHC: 30.8 g/dL (ref 30.0–36.0)
MCV: 76.9 fL — AB (ref 78.0–100.0)
Platelets: 319 10*3/uL (ref 150–400)
RBC: 3.25 MIL/uL — AB (ref 3.87–5.11)
RDW: 16.3 % — ABNORMAL HIGH (ref 11.5–15.5)
WBC: 7.4 10*3/uL (ref 4.0–10.5)

## 2014-09-17 LAB — FERRITIN: Ferritin: 1383 ng/mL — ABNORMAL HIGH (ref 11–307)

## 2014-09-17 LAB — BASIC METABOLIC PANEL
ANION GAP: 10 (ref 5–15)
BUN: 23 mg/dL — ABNORMAL HIGH (ref 6–20)
CHLORIDE: 97 mmol/L — AB (ref 101–111)
CO2: 29 mmol/L (ref 22–32)
Calcium: 9 mg/dL (ref 8.9–10.3)
Creatinine, Ser: 0.99 mg/dL (ref 0.44–1.00)
GFR calc non Af Amer: 52 mL/min — ABNORMAL LOW (ref >60)
Glucose, Bld: 90 mg/dL (ref 65–99)
Potassium: 3.4 mmol/L — ABNORMAL LOW (ref 3.5–5.1)
Sodium: 136 mmol/L (ref 135–145)

## 2014-09-17 LAB — PREPARE RBC (CROSSMATCH)

## 2014-09-17 LAB — VITAMIN B12: Vitamin B-12: 1838 pg/mL — ABNORMAL HIGH (ref 180–914)

## 2014-09-17 LAB — MRSA PCR SCREENING: MRSA BY PCR: NEGATIVE

## 2014-09-17 LAB — ABO/RH: ABO/RH(D): O NEG

## 2014-09-17 LAB — FOLATE: Folate: 10.8 ng/mL (ref >5.9)

## 2014-09-17 MED ORDER — ACETAMINOPHEN 325 MG PO TABS: 650.0000 mg | ORAL_TABLET | Freq: Four times a day (QID) | ORAL | Status: DC | PRN

## 2014-09-17 MED ORDER — PAROXETINE HCL 10 MG PO TABS
10.0000 mg | ORAL_TABLET | Freq: Every day | ORAL | Status: DC
  Administered 2014-09-17 – 2014-09-20 (×4): 10 mg via ORAL
  Filled 2014-09-17 (×4): qty 1

## 2014-09-17 MED ORDER — CLOPIDOGREL BISULFATE 75 MG PO TABS
75.0000 mg | ORAL_TABLET | Freq: Every day | ORAL | Status: DC
  Administered 2014-09-17 – 2014-09-20 (×4): 75 mg via ORAL
  Filled 2014-09-17 (×4): qty 1

## 2014-09-17 MED ORDER — KCL IN DEXTROSE-NACL 20-5-0.9 MEQ/L-%-% IV SOLN
INTRAVENOUS | Status: DC
  Administered 2014-09-17: 02:00:00 via INTRAVENOUS
  Filled 2014-09-17: qty 1000

## 2014-09-17 MED ORDER — VITAMIN D 1000 UNITS PO TABS
2000.0000 [IU] | ORAL_TABLET | Freq: Every day | ORAL | Status: DC
  Administered 2014-09-17 – 2014-09-20 (×4): 2000 [IU] via ORAL
  Filled 2014-09-17 (×4): qty 2

## 2014-09-17 MED ORDER — CALCIUM CARBONATE 1250 (500 CA) MG PO TABS
1250.0000 mg | ORAL_TABLET | Freq: Two times a day (BID) | ORAL | Status: DC
  Administered 2014-09-17: 1250 mg via ORAL
  Filled 2014-09-17 (×3): qty 1

## 2014-09-17 MED ORDER — LORAZEPAM 0.5 MG PO TABS
0.5000 mg | ORAL_TABLET | Freq: Three times a day (TID) | ORAL | Status: DC
  Administered 2014-09-17 – 2014-09-20 (×10): 0.5 mg via ORAL
  Filled 2014-09-17 (×10): qty 1

## 2014-09-17 MED ORDER — ROPINIROLE HCL 1 MG PO TABS
3.0000 mg | ORAL_TABLET | Freq: Every day | ORAL | Status: DC
  Administered 2014-09-17 – 2014-09-19 (×4): 3 mg via ORAL
  Filled 2014-09-17 (×5): qty 3

## 2014-09-17 MED ORDER — OXYCODONE HCL 5 MG PO TABS
5.0000 mg | ORAL_TABLET | ORAL | Status: DC | PRN
  Administered 2014-09-18 – 2014-09-19 (×4): 5 mg via ORAL
  Filled 2014-09-17 (×4): qty 1

## 2014-09-17 MED ORDER — HYDROMORPHONE HCL 1 MG/ML IJ SOLN
0.5000 mg | INTRAMUSCULAR | Status: DC | PRN
  Administered 2014-09-19 – 2014-09-20 (×3): 1 mg via INTRAVENOUS
  Filled 2014-09-17 (×3): qty 1

## 2014-09-17 MED ORDER — SACCHAROMYCES BOULARDII 250 MG PO CAPS
250.0000 mg | ORAL_CAPSULE | Freq: Two times a day (BID) | ORAL | Status: DC
  Administered 2014-09-17 – 2014-09-20 (×7): 250 mg via ORAL
  Filled 2014-09-17 (×7): qty 1

## 2014-09-17 MED ORDER — DEXTROSE 5 % IV SOLN
1.0000 g | INTRAVENOUS | Status: DC
  Administered 2014-09-17 – 2014-09-19 (×3): 1 g via INTRAVENOUS
  Filled 2014-09-17 (×4): qty 10

## 2014-09-17 MED ORDER — ALUM & MAG HYDROXIDE-SIMETH 200-200-20 MG/5ML PO SUSP: 30.0000 mL | Freq: Four times a day (QID) | ORAL | Status: DC | PRN

## 2014-09-17 MED ORDER — LEVOTHYROXINE SODIUM 112 MCG PO TABS
112.0000 ug | ORAL_TABLET | Freq: Every day | ORAL | Status: DC
  Administered 2014-09-17 – 2014-09-20 (×4): 112 ug via ORAL
  Filled 2014-09-17 (×4): qty 1

## 2014-09-17 MED ORDER — ACETAMINOPHEN 650 MG RE SUPP: 650.0000 mg | Freq: Four times a day (QID) | RECTAL | Status: DC | PRN

## 2014-09-17 MED ORDER — SODIUM CHLORIDE 0.9 % IJ SOLN
3.0000 mL | Freq: Two times a day (BID) | INTRAMUSCULAR | Status: DC
  Administered 2014-09-17 – 2014-09-20 (×7): 3 mL via INTRAVENOUS

## 2014-09-17 MED ORDER — VITAMIN B-12 1000 MCG PO TABS
1000.0000 ug | ORAL_TABLET | Freq: Every day | ORAL | Status: DC
  Administered 2014-09-17 – 2014-09-20 (×4): 1000 ug via ORAL
  Filled 2014-09-17 (×4): qty 1

## 2014-09-17 MED ORDER — CALCIUM CARBONATE 1250 (500 CA) MG PO TABS
1.0000 | ORAL_TABLET | Freq: Once | ORAL | Status: AC
  Administered 2014-09-17: 500 mg via ORAL
  Filled 2014-09-17: qty 1

## 2014-09-17 MED ORDER — VITAMIN C 500 MG PO TABS
1000.0000 mg | ORAL_TABLET | Freq: Every day | ORAL | Status: DC
  Administered 2014-09-17 – 2014-09-20 (×4): 1000 mg via ORAL
  Filled 2014-09-17 (×4): qty 2

## 2014-09-17 MED ORDER — ONDANSETRON HCL 4 MG PO TABS: 4.0000 mg | ORAL_TABLET | Freq: Four times a day (QID) | ORAL | Status: DC | PRN

## 2014-09-17 MED ORDER — FERROUS SULFATE 325 (65 FE) MG PO TABS
325.0000 mg | ORAL_TABLET | Freq: Two times a day (BID) | ORAL | Status: DC
  Administered 2014-09-17 – 2014-09-20 (×6): 325 mg via ORAL
  Filled 2014-09-17 (×8): qty 1

## 2014-09-17 MED ORDER — ONDANSETRON HCL 4 MG/2ML IJ SOLN: 4.0000 mg | Freq: Four times a day (QID) | INTRAMUSCULAR | Status: DC | PRN

## 2014-09-17 MED ORDER — ENOXAPARIN SODIUM 40 MG/0.4ML ~~LOC~~ SOLN
40.0000 mg | SUBCUTANEOUS | Status: DC
  Administered 2014-09-17 – 2014-09-20 (×4): 40 mg via SUBCUTANEOUS
  Filled 2014-09-17 (×4): qty 0.4

## 2014-09-17 MED ORDER — CALCIUM CARBONATE 1250 (500 CA) MG PO TABS
1.0000 | ORAL_TABLET | Freq: Two times a day (BID) | ORAL | Status: DC
  Administered 2014-09-18 – 2014-09-20 (×5): 500 mg via ORAL
  Filled 2014-09-17 (×6): qty 1

## 2014-09-17 NOTE — Progress Notes (Signed)
Encouraged patient to get up and ambulate with walker, gait belt and 2 RN assist as telephone call by MD. Patient refused to get out of bed. Adah Salvage, RN

## 2014-09-17 NOTE — Consult Note (Signed)
WOC wound consult note Reason for Consult:patient with previous history of heel pressure injuries.  At this time, wounds are recently healed (reepithyelialized) and there are no open areas.  A preventative care plan placed.  [facility] nursing team will not follow, but will remain available to this patient, the nursing and medical teams.  Please re-consult if needed. Thanks, Maudie Flakes, MSN, RN, [place], [place], [place] [phone])

## 2014-09-17 NOTE — Progress Notes (Signed)
[facility] Progress Note   [name]  XBD:[id number]  DOB: August 16, 1930  DOA: 09/16/2014 PCP: Courtney Blackbird, MD  Brief narrative: [name] is a 81 y.o. female who lives at home and has family with her 24 hours with a history of CVA left-sided hemiparesis, hypertension, diabetes mellitus type 2, chronic kidney disease stage III who was brought to the ER for lethargy and confusion. The patient had not eaten much during the day and she was found to have a glucose of 50s. She is given IV dextrose and sugar improved however it dropped again. She was also noted to have of UA consistent with UTI. Notes in epic revealed that she was treated with a ten-day course of Macrobid which was completed 3 days ago. Culture results revealed Escherichia coli UTI-sensitivities not mentioned in notes.   Subjective: Alert, tells me she is in an new place but not sure where. Has no c/o pain, nausea, diarrhea, dyspnea.   Assessment/Plan: Principal Problem:   Hypoglycemia - diabetes mellitus type 2 - To lethargy and poor oral intake-sugars still low normal here-he takes NPH 8 units before breakfast which she was not given today and sliding scale insulin with meals  Active Problems: Lethargy -Possibly dehydration are related to UTI -appears quite alert today  UTI -UA grossly positive with confusion and lethargy -Completed a 10 day course of Macrobid 3 days ago-currently on Rocephin-follow urine culture  Dehydration -Based on elevated BUN/creatinine ratio ---Continue to hydrate and encourage oral fluids    Hypothyroidism -Continue Synthroid     Essential hypertension -Indapamide on hold-  would not treat her blood pressure aggressively    Anemia in CKD (chronic kidney disease) -According to notes from nursing facility stay she did recently receive 2 units of packed red blood cells -Follows with hematology as outpatient-received 2 units of packed red blood cells bringing hemoglobin above  9 -Records in epic show that she last saw her hematologist last month -Her hematologist has been notified that she is hospitalized in case she would like to see her    CKD (chronic kidney disease) stage 3, GFR 30-59 ml/min -Stable    CVA (cerebral infarction) -Right-sided weakness    Hypokalemia -Replacing-repeat tomorrow    Pressure ulcer -on heels have re-epithelialized   Code Status:     Code Status Orders        Start     Ordered   09/17/14 0036  Full code   Continuous     09/17/14 0035    Advance Directive Documentation        Most Recent Value   Type of Advance Directive  Healthcare Power of Attorney   Pre-existing out of facility DNR order (yellow form or pink MOST form)     "MOST" Form in Place?       Family Communication: With daughter, [name] Disposition Plan: Home 1-2 days DVT prophylaxis: Lovenox Consultants: Procedures: Appt with PCP: Requested for next week Antibiotics: Anti-infectives    Start     Dose/Rate Route Frequency Ordered Stop   09/17/14 2200  cefTRIAXone (ROCEPHIN) 1 g in dextrose 5 % 50 mL IVPB     1 g 100 mL/hr over 30 Minutes Intravenous Every 24 hours 09/17/14 0015     09/16/14 2215  cefTRIAXone (ROCEPHIN) 1 g in dextrose 5 % 50 mL IVPB     1 g 100 mL/hr over 30 Minutes Intravenous  Once 09/16/14 2207 09/16/14 2327      Objective: [employer]  09/16/14 2148 09/17/14 0045  Weight: 74.844 kg (165 lb) 70.8 kg (156 lb 1.4 oz)    Intake/Output Summary (Last 24 hours) at 09/17/14 1535 Last data filed at 09/17/14 0900  Gross per 24 hour  Intake 1559.59 ml  Output    100 ml  Net 1459.59 ml     Vitals Filed Vitals:   09/17/14 0354 09/17/14 0405 09/17/14 0430 09/17/14 0635  BP: 153/66 151/61 142/60 151/59  Pulse: 68 63 61 62  Temp: 97.4 F (36.3 C) 97.2 F (36.2 C) 97.2 F (36.2 C) 97.7 F (36.5 C)  TempSrc: Oral Oral Oral Oral  Resp: 20 18 18 20   Height:      Weight:      SpO2: 100% 100% 100% 100%     Exam:  General:  Pt is alert, not in acute distress, slightly confused  HEENT: No icterus, No thrush, oral mucosa moist  Cardiovascular: regular rate and rhythm, S1/S2 No murmur  Respiratory: clear to auscultation bilaterally   Abdomen: Soft, +Bowel sounds, non tender, non distended, no guarding  MSK: No LE edema, cyanosis or clubbing  Data Reviewed: Basic Metabolic Panel:  Recent Labs Lab 09/16/14 2130 09/17/14 0146  NA 135 136  K 3.2* 3.4*  CL 99* 97*  CO2 30 29  GLUCOSE 90 90  BUN 22* 23*  CREATININE 0.94 0.99  CALCIUM 8.8* 9.0   Liver Function Tests:  Recent Labs Lab 09/16/14 2130  AST 17  ALT 10*  ALKPHOS 65  BILITOT 0.6  PROT 6.5  ALBUMIN 2.3*   No results for input(s): LIPASE, AMYLASE in the last 168 hours. No results for input(s): AMMONIA in the last 168 hours. CBC:  Recent Labs Lab 09/16/14 2130 09/17/14 0146 09/17/14 0900  WBC 7.9 7.4  --   HGB 7.1* 7.7* 9.7*  HCT 23.3* 25.0* 30.5*  MCV 75.9* 76.9*  --   PLT 338 319  --    Cardiac Enzymes: No results for input(s): CKTOTAL, CKMB, CKMBINDEX, TROPONINI in the last 168 hours. BNP (last 3 results) No results for input(s): BNP in the last 8760 hours.  ProBNP (last 3 results) No results for input(s): PROBNP in the last 8760 hours.  CBG:  Recent Labs Lab 09/16/14 2213 09/17/14 0050 09/17/14 0416 09/17/14 0728 09/17/14 1223  GLUCAP 85 93 103* 98 104*    Recent Results (from the past 240 hour(s))  MRSA PCR Screening     Status: None   Collection Time: 09/17/14  1:45 AM  Result Value Ref Range Status   MRSA by PCR NEGATIVE NEGATIVE Final    Comment:        The GeneXpert MRSA Assay (FDA approved for NASAL specimens only), is one component of a comprehensive MRSA colonization surveillance program. It is not intended to diagnose MRSA infection nor to guide or monitor treatment for MRSA infections.      Studies: No results found.  Scheduled Meds:  Scheduled Meds: .  sodium chloride   Intravenous STAT  . sodium chloride   Intravenous Once  . calcium carbonate  1,250 mg Oral BID WC  . cefTRIAXone (ROCEPHIN)  IV  1 g Intravenous Q24H  . cholecalciferol  2,000 Units Oral Daily  . clopidogrel  75 mg Oral Q breakfast  . enoxaparin (LOVENOX) injection  40 mg Subcutaneous Q24H  . ferrous sulfate  325 mg Oral BID PC  . insulin aspart  0-9 Units Subcutaneous 6 times per day  . levothyroxine  112 mcg Oral QAC breakfast  .  LORazepam  0.5 mg Oral TID  . PARoxetine  10 mg Oral Daily  . rOPINIRole  3 mg Oral QHS  . saccharomyces boulardii  250 mg Oral BID  . sodium chloride  500 mL Intravenous Once  . sodium chloride  3 mL Intravenous Q12H  . vitamin B-12  1,000 mcg Oral Daily  . vitamin C  1,000 mg Oral Daily   Continuous Infusions: . sodium chloride 20 mL/hr (09/16/14 2133)    Time spent on care of this patient: 35 min   [place], MD 09/17/2014, 3:35 PM  LOS: 1 day   [facility] Office  [phone] Pager - Text Page per [email or web address] If 7PM-7AM, please contact night-coverage [email or web address]

## 2014-09-17 NOTE — Evaluation (Addendum)
Physical Therapy Evaluation Patient Details Name: [name] MRN: [id number] DOB: [birth year] Today's Date: 09/17/2014   History of Present Illness  81 yo female admitted with hypoglycemia. Hx of HTn, DM, CVA, arthritis, breast cancer, dementia.   Clinical Impression  Limited bed level eval only on today. Pt required Max assist +2 for bed mobility. Total assist for hygiene-bowel incontinence at start of session. Pt follows commands with repeated cueing and time. No family present during session. Recommend HH/24 sup/assist vs SNF depending on progress and assist available at home. Will follow on trial basis during hospital stay to continue to assess pt's ability to participate in acute setting.     Follow Up Recommendations SNF vs Home Health PT; 24 hour supervision/assist (depending on progress and assist available at home. No family present during sesson)    Equipment Recommendations  None recommended by PT    Recommendations for Other Services       Precautions / Restrictions Precautions Precautions: Fall Restrictions Weight Bearing Restrictions: No      Mobility  Bed Mobility Overal bed mobility: Needs Assistance Bed Mobility: Rolling Rolling: Max assist;+2 for physical assistance;+2 for safety/equipment         General bed mobility comments: Rolling  to L and R. Assist for trunk and LE positioning. Utilized bedpad as well. Multimodal cueing for participation, technique, hand/LE placement. Pt initiated hand placement with cueing but requires significant assist for trunk and LEs.  Transfers                 General transfer comment: NT  Ambulation/Gait             General Gait Details: NT  Stairs            Wheelchair Mobility    Modified Rankin (Stroke Patients Only)       Balance                                             Pertinent Vitals/Pain Pain Assessment: No/denies pain    Home Living Family/patient expects  to be discharged to:: Unsure                 Additional Comments: family not present in session. Pt unreliable historian    Prior Function Level of Independence: Needs assistance         Comments: Her need for assistance is inferred from current status, and from chart review and status at most recent admission approx 4 wks ago     Hand Dominance        Extremity/Trunk Assessment   Upper Extremity Assessment: Generalized weakness           Lower Extremity Assessment: Difficult to assess due to impaired cognition         Communication   Communication: No difficulties  Cognition Arousal/Alertness: Awake/alert Behavior During Therapy: WFL for tasks assessed/performed Overall Cognitive Status: History of cognitive impairments - at baseline                      General Comments      Exercises        Assessment/Plan    PT Assessment Patient needs continued PT services  PT Diagnosis Difficulty walking;Generalized weakness;Altered mental status   PT Problem List Decreased strength;Decreased range of motion;Decreased activity tolerance;Decreased balance;Decreased mobility;Decreased knowledge of use of DME;Decreased cognition  PT Treatment Interventions Functional mobility training;Therapeutic activities;Therapeutic exercise;Balance training;DME instruction;Patient/family education   PT Goals (Current goals can be found in the Care Plan section) Acute Rehab PT Goals Patient Stated Goal: none stated PT Goal Formulation: Patient unable to participate in goal setting Time For Goal Achievement: 10/01/14 Potential to Achieve Goals: Poor    Frequency Min 2X/week (will follow on trial basis)   Barriers to discharge        Co-evaluation               End of Session   Activity Tolerance: Patient tolerated treatment well Patient left: in bed;with call bell/phone within reach;with bed alarm set           Time: 1422-1440 PT Time Calculation  (min) (ACUTE ONLY): 18 min   Charges:   PT Evaluation $Initial PT Evaluation Tier I: 1 Procedure     PT G Codes:        Weston Anna, MPT Pager: [phone]

## 2014-09-18 ENCOUNTER — Encounter: Payer: Self-pay | Admitting: Neurology

## 2014-09-18 LAB — BASIC METABOLIC PANEL
ANION GAP: 9 (ref 5–15)
BUN: 20 mg/dL (ref 6–20)
CHLORIDE: 97 mmol/L — AB (ref 101–111)
CO2: 30 mmol/L (ref 22–32)
CREATININE: 0.81 mg/dL (ref 0.44–1.00)
Calcium: 8.9 mg/dL (ref 8.9–10.3)
GFR calc non Af Amer: >60 mL/min (ref >60)
Glucose, Bld: 104 mg/dL — ABNORMAL HIGH (ref 65–99)
POTASSIUM: 3.3 mmol/L — AB (ref 3.5–5.1)
SODIUM: 136 mmol/L (ref 135–145)

## 2014-09-18 LAB — CBC
HEMATOCRIT: 28 % — AB (ref 36.0–46.0)
Hemoglobin: 8.7 g/dL — ABNORMAL LOW (ref 12.0–15.0)
MCH: 23.9 pg — ABNORMAL LOW (ref 26.0–34.0)
MCHC: 31.1 g/dL (ref 30.0–36.0)
MCV: 76.9 fL — AB (ref 78.0–100.0)
PLATELETS: 300 10*3/uL (ref 150–400)
RBC: 3.64 MIL/uL — AB (ref 3.87–5.11)
RDW: 15.9 % — ABNORMAL HIGH (ref 11.5–15.5)
WBC: 6.8 10*3/uL (ref 4.0–10.5)

## 2014-09-18 LAB — TYPE AND SCREEN
ABO/RH(D): O NEG
ANTIBODY SCREEN: NEGATIVE
UNIT DIVISION: 0
Unit division: 0

## 2014-09-18 LAB — GLUCOSE, CAPILLARY
GLUCOSE-CAPILLARY: 110 mg/dL — AB (ref 65–99)
GLUCOSE-CAPILLARY: 105 mg/dL — AB (ref 65–99)
GLUCOSE-CAPILLARY: 97 mg/dL (ref 65–99)
Glucose-Capillary: 110 mg/dL — ABNORMAL HIGH (ref 65–99)
Glucose-Capillary: 109 mg/dL — ABNORMAL HIGH (ref 65–99)
Glucose-Capillary: 121 mg/dL — ABNORMAL HIGH (ref 65–99)

## 2014-09-18 LAB — MAGNESIUM: Magnesium: 1.4 mg/dL — ABNORMAL LOW (ref 1.7–2.4)

## 2014-09-18 LAB — OCCULT BLOOD X 1 CARD TO LAB, STOOL: Fecal Occult Bld: NEGATIVE

## 2014-09-18 MED ORDER — DOCUSATE SODIUM 100 MG PO CAPS
100.0000 mg | ORAL_CAPSULE | Freq: Two times a day (BID) | ORAL | Status: DC
  Administered 2014-09-18 – 2014-09-20 (×5): 100 mg via ORAL
  Filled 2014-09-18 (×6): qty 1

## 2014-09-18 MED ORDER — POTASSIUM CHLORIDE CRYS ER 20 MEQ PO TBCR
40.0000 meq | EXTENDED_RELEASE_TABLET | ORAL | Status: AC
  Administered 2014-09-18 (×2): 40 meq via ORAL
  Filled 2014-09-18 (×2): qty 2

## 2014-09-18 NOTE — Clinical Social Work Placement (Signed)
   CLINICAL SOCIAL WORK PLACEMENT  NOTE  Date:  09/18/2014  Patient Details  Name: [name] MRN: [id number] Date of Birth: [birth year]  Clinical Social Work is seeking post-discharge placement for this patient at the [place] level of care (*CSW will initial, date and re-position this form in  chart as items are completed):  Yes   Patient/family provided with [place] Work Department's list of facilities offering this level of care within the geographic area requested by the patient (or if unable, by the patient's family).  Yes   Patient/family informed of their freedom to choose among providers that offer the needed level of care, that participate in Medicare, Medicaid or managed care program needed by the patient, have an available bed and are willing to accept the patient.  Yes   Patient/family informed of [facility]'s ownership interest in [facility] and [facility], as well as of the fact that they are under no obligation to receive care at these facilities.  PASRR submitted to EDS on       PASRR number received on       Existing PASRR number confirmed on 09/18/14     FL2 transmitted to all facilities in geographic area requested by pt/family on 09/18/14     FL2 transmitted to all facilities within larger geographic area on       Patient informed that his/her managed care company has contracts with or will negotiate with certain facilities, including the following:            Patient/family informed of bed offers received.  Patient chooses bed at       Physician recommends and patient chooses bed at      Patient to be transferred to   on  .  Patient to be transferred to facility by       Patient family notified on   of transfer.  Name of family member notified:        PHYSICIAN       Additional Comment:    _______________________________________________ Standley Brooking, LCSW 09/18/2014, 10:44 AM

## 2014-09-18 NOTE — Clinical Social Work Note (Signed)
Clinical Social Work Assessment  Patient Details  Name: [name] MRN: [id number] Date of Birth: July 24, 1930  Date of referral:  09/18/14               Reason for consult:  Facility Placement                Permission sought to share information with:  Chartered certified accountant granted to share information::  Yes, Verbal Permission Granted  Name::        Agency::     Relationship::     Contact Information:     Housing/Transportation Living arrangements for the past 2 months:  Single Family Home Source of Information:  Adult Children Patient Interpreter Needed:  None Criminal Activity/Legal Involvement Pertinent to Current Situation/Hospitalization:  No - Comment as needed Significant Relationships:  Adult Children Lives with:  Self Do you feel safe going back to the place where you live?  No Need for family participation in patient care:  Yes (Comment)  Care giving concerns:  CSW reviewed PT evaluation recommending SNF vs Home Health/Supervision assistance 24 hour at discharge.    Social Worker assessment / plan:  CSW spoke with patient's daughter, [name] via phone re: discharge plan. Patient had recently been at [facility] admitted 6/20 - 7/15 then to hosp on 7/15-7/17 readmitted to [facility] 7/17- 8/22 used 61 days.   Employment status:  Retired Forensic scientist:  Medicare PT Recommendations:  [place] / Referral to community resources:  [place]  Patient/Family's Response to care:  CSW checked with [facility] to see if they would be able to offer a bed, but per Ivin Booty at [facility] they are not - patient's daughter, [name] aware. Daughter requested [facility] SNF, CSW awaiting call back re: bed offer. Patient's daughter informed CSW that SNF stay would be short term, with plans to return home after rehab. Per daughter, she lives 3 houses down from her and checks on her often.   Patient/Family's  Understanding of and Emotional Response to Diagnosis, Current Treatment, and Prognosis:  Daughter was concerned about her lethargy & not eating.   Emotional Assessment Appearance:  Appears stated age Attitude/Demeanor/Rapport:    Affect (typically observed):    Orientation:  Oriented to Self, Oriented to Place Alcohol / Substance use:    Psych involvement (Current and /or in the community):     Discharge Needs  Concerns to be addressed:    Readmission within the last 30 days:    Current discharge risk:    Barriers to Discharge:      Standley Brooking, LCSW 09/18/2014, 10:38 AM

## 2014-09-18 NOTE — Care Management Important Message (Signed)
Important Message  Patient Details  Name: Chantavia Bazzle MRN: [id number] Date of Birth: March 19, 1930   Medicare Important Message Given:  Yes-second notification given    Camillo Flaming 09/18/2014, 10:32 AMImportant Message  Patient Details  Name: Weda Baumgarner MRN: [id number] Date of Birth: 1931-01-13   Medicare Important Message Given:  Yes-second notification given    Camillo Flaming 09/18/2014, 10:32 AM

## 2014-09-18 NOTE — Care Management Note (Signed)
Case Management Note  Patient Details  Name: [name] MRN: [id number] Date of Birth: 1930/06/24  Subjective/Objective:  81 y/o f admitted w/Hypoglycemia. From home w/HHC. Per [place] were providing 24hr caregiver service-custodial level, & HHC.Patient was at a SNF in the past.PT recc SNF. CSW aware & following.                Action/Plan:d/c plan SNF.   Expected Discharge Date:                  Expected Discharge Plan:  [place]  In-House Referral:  Clinical Social Work  Discharge planning Services  CM Consult  Post Acute Care Choice:  Home Health [facility]-custodial private duty care services) Choice offered to:     DME Arranged:    DME Agency:     HH Arranged:    [place] Agency:     Status of Service:  In process, will continue to follow  Medicare Important Message Given:  Yes-second notification given Date Medicare IM Given:    Medicare IM give by:    Date Additional Medicare IM Given:    Additional Medicare Important Message give by:     If discussed at [place] of Stay Meetings, dates discussed:    Additional Comments:  Dessa Phi, RN 09/18/2014, 11:21 AM

## 2014-09-18 NOTE — Progress Notes (Addendum)
[facility] Progress Note   Marlies Ligman  SWN:[id number]  DOB: Mar 15, 1930  DOA: 09/16/2014 PCP: Antony Blackbird, MD  Brief narrative: [name] is a 81 y.o. female who lives at home and has family with her 24 hours with a history of CVA left-sided hemiparesis, hypertension, diabetes mellitus type 2, chronic kidney disease stage III who was brought to the ER for lethargy and confusion. The patient had not eaten much during the day due to lethargy and she was found to have a glucose of 50s. She is given IV dextrose and sugar improved however it dropped again. She was also noted to have of UA consistent with UTI. Notes in epic revealed that she was treated with a ten-day course of Macrobid which was completed 3 days ago. Culture results revealed Escherichia coli UTI-sensitivities not mentioned in notes.   Subjective: Alert, able to tell me today that she is in the hospital. She complains of pain in her left knee. She was not able to get up with the physical therapist this morning. She has no complaints of nausea vomiting abdominal pain shortness of breath or chest pain.  Assessment/Plan: Principal Problem:   Hypoglycemia - diabetes mellitus type 2 - To lethargy and poor oral intake-sugars still low normal here-he takes NPH 8 units before breakfast which she has not been receiving in the hospital-continue  sliding scale insulin with meals  Active Problems: Lethargy -Possibly dehydration and related to UTI -appears quite alert now-we'll continue IV hydration and monitor by mouth intake-limited insulin to prevent further hypoglycemia as mentioned above-urine culture pending  UTI -UA grossly positive with confusion and lethargy -Completed a 10 day course of Macrobid 3 days ago-currently on Rocephin-follow urine culture  Dehydration -Based on elevated BUN/creatinine ratio ---Continue to hydrate and encourage oral fluids  Left knee pain -No swelling or tenderness noted on exam-she  has received oxycodone-I have asked RN to please ask PT to come back later in the day so we can get an eval    Hypothyroidism -Continue Synthroid     Essential hypertension -Indapamide on hold-  would not treat her blood pressure aggressively at her age and therefore would not resume indapamide on discharge    Anemia in CKD (chronic kidney disease) -According to notes from nursing facility stay she did recently receive 2 units of packed red blood cells -Follows with hematology as outpatient-received 2 units of packed red blood cells on admission bringing hemoglobin above 9- 8.7 today-possibly lab variance-continue to follow -Records in epic show that she last saw her hematologist last month -Her hematologist has been notified on 8/29 that she is hospitalized in case she would like to see her    CKD (chronic kidney disease) stage 3??? -Creatinine normalized with hydration with GFR now being greater than 60 therefore does not have CK D3    CVA (cerebral infarction) -Right-sided weakness    Hypokalemia -Replacing-repeat tomorrow-magnesium    Pressure ulcer -on heels have re-epithelialized  Dementia - No behavioral disturbances  Social issues: According to case Freight forwarder, patient lives at home alone and is not being adequately supervised by family members. Spoke with her daughter yesterday who states that they check on her regularly and that she is never alone. This may be an Adult Protective Services case. Would not discharge patient to go back home. Follow-up on PT eval.   Code Status:     Code Status Orders        Start     Ordered   09/17/14  0036  Full code   Continuous     09/17/14 0035    Advance Directive Documentation        Most Recent Value   Type of Advance Directive  Healthcare Power of Attorney   Pre-existing out of facility DNR order (yellow form or pink MOST form)     "MOST" Form in Place?       Family Communication: With daughter, Rosaura Bolon on  8/29 Disposition Plan: Pending DVT prophylaxis: Lovenox Consultants: Procedures: Appt with PCP: Requested for next week Antibiotics: Anti-infectives    Start     Dose/Rate Route Frequency Ordered Stop   09/17/14 2200  cefTRIAXone (ROCEPHIN) 1 g in dextrose 5 % 50 mL IVPB     1 g 100 mL/hr over 30 Minutes Intravenous Every 24 hours 09/17/14 0015     09/16/14 2215  cefTRIAXone (ROCEPHIN) 1 g in dextrose 5 % 50 mL IVPB     1 g 100 mL/hr over 30 Minutes Intravenous  Once 09/16/14 2207 09/16/14 2327      Objective: Filed Weights   09/16/14 2148 09/17/14 0045  Weight: 74.844 kg (165 lb) 70.8 kg (156 lb 1.4 oz)    Intake/Output Summary (Last 24 hours) at 09/18/14 1149 Last data filed at 09/18/14 0700  Gross per 24 hour  Intake   1209 ml  Output      0 ml  Net   1209 ml     Vitals Filed Vitals:   09/17/14 2009 09/18/14 0010 09/18/14 0522 09/18/14 1100  BP: 162/79 154/73 160/77 156/82  Pulse: 70 72 71 71  Temp: 98.5 F (36.9 C) 98 F (36.7 C) 98.6 F (37 C) 98.6 F (37 C)  TempSrc: Oral Oral Oral Oral  Resp: 19 18 19 18   Height:      Weight:      SpO2: 100% 100% 100% 100%    Exam:  General:  Pt is alert, confused about time and situation  HEENT: No icterus, No thrush, oral mucosa moist  Cardiovascular: regular rate and rhythm, S1/S2 No murmur  Respiratory: clear to auscultation bilaterally   Abdomen: Soft, +Bowel sounds, non tender, non distended, no guarding  MSK: No LE edema, cyanosis or clubbing- no tenderness in left knee or right knee  Data Reviewed: Basic Metabolic Panel:  Recent Labs Lab 09/16/14 2130 09/17/14 0146 09/18/14 0444  NA 135 136 136  K 3.2* 3.4* 3.3*  CL 99* 97* 97*  CO2 30 29 30   GLUCOSE 90 90 104*  BUN 22* 23* 20  CREATININE 0.94 0.99 0.81  CALCIUM 8.8* 9.0 8.9   Liver Function Tests:  Recent Labs Lab 09/16/14 2130  AST 17  ALT 10*  ALKPHOS 65  BILITOT 0.6  PROT 6.5  ALBUMIN 2.3*   No results for input(s): LIPASE,  AMYLASE in the last 168 hours. No results for input(s): AMMONIA in the last 168 hours. CBC:  Recent Labs Lab 09/16/14 2130 09/17/14 0146 09/17/14 0900 09/18/14 0444  WBC 7.9 7.4  --  6.8  HGB 7.1* 7.7* 9.7* 8.7*  HCT 23.3* 25.0* 30.5* 28.0*  MCV 75.9* 76.9*  --  76.9*  PLT 338 319  --  300   Cardiac Enzymes: No results for input(s): CKTOTAL, CKMB, CKMBINDEX, TROPONINI in the last 168 hours. BNP (last 3 results) No results for input(s): BNP in the last 8760 hours.  ProBNP (last 3 results) No results for input(s): PROBNP in the last 8760 hours.  CBG:  Recent Labs Lab 09/17/14  1636 09/17/14 2020 09/18/14 0014 09/18/14 0505 09/18/14 0804  GLUCAP 145* 141* 110* 109* 110*    Recent Results (from the past 240 hour(s))  Urine culture     Status: None (Preliminary result)   Collection Time: 09/16/14  9:22 PM  Result Value Ref Range Status   Specimen Description URINE, CATHETERIZED  Final   Special Requests NONE  Final   Culture   Final    >=100,000 COLONIES/mL GRAM NEGATIVE RODS Performed at [facility]    Report Status PENDING  Incomplete  MRSA PCR Screening     Status: None   Collection Time: 09/17/14  1:45 AM  Result Value Ref Range Status   MRSA by PCR NEGATIVE NEGATIVE Final    Comment:        The GeneXpert MRSA Assay (FDA approved for NASAL specimens only), is one component of a comprehensive MRSA colonization surveillance program. It is not intended to diagnose MRSA infection nor to guide or monitor treatment for MRSA infections.      Studies: No results found.  Scheduled Meds:  Scheduled Meds: . sodium chloride   Intravenous Once  . calcium carbonate  1 tablet Oral BID WC  . cefTRIAXone (ROCEPHIN)  IV  1 g Intravenous Q24H  . cholecalciferol  2,000 Units Oral Daily  . clopidogrel  75 mg Oral Q breakfast  . enoxaparin (LOVENOX) injection  40 mg Subcutaneous Q24H  . ferrous sulfate  325 mg Oral BID PC  . insulin aspart  0-9 Units  Subcutaneous 6 times per day  . levothyroxine  112 mcg Oral QAC breakfast  . LORazepam  0.5 mg Oral TID  . PARoxetine  10 mg Oral Daily  . potassium chloride  40 mEq Oral Q4H  . rOPINIRole  3 mg Oral QHS  . saccharomyces boulardii  250 mg Oral BID  . sodium chloride  500 mL Intravenous Once  . sodium chloride  3 mL Intravenous Q12H  . vitamin B-12  1,000 mcg Oral Daily  . vitamin C  1,000 mg Oral Daily   Continuous Infusions: . sodium chloride 20 mL/hr (09/16/14 2133)    Time spent on care of this patient: 35 min   [place], MD 09/18/2014, 11:49 AM  LOS: 2 days   [facility] Office  [phone] Pager - Text Page per [email or web address] If 7PM-7AM, please contact night-coverage [email or web address]

## 2014-09-19 ENCOUNTER — Inpatient Hospital Stay (HOSPITAL_COMMUNITY): Payer: Medicare Other

## 2014-09-19 DIAGNOSIS — E162 Hypoglycemia, unspecified: Secondary | ICD-10-CM

## 2014-09-19 LAB — BASIC METABOLIC PANEL
ANION GAP: 8 (ref 5–15)
BUN: 16 mg/dL (ref 6–20)
CALCIUM: 9.2 mg/dL (ref 8.9–10.3)
CHLORIDE: 98 mmol/L — AB (ref 101–111)
CO2: 30 mmol/L (ref 22–32)
CREATININE: 0.79 mg/dL (ref 0.44–1.00)
GFR calc non Af Amer: >60 mL/min (ref >60)
Glucose, Bld: 115 mg/dL — ABNORMAL HIGH (ref 65–99)
Potassium: 4.4 mmol/L (ref 3.5–5.1)
SODIUM: 136 mmol/L (ref 135–145)

## 2014-09-19 LAB — GLUCOSE, CAPILLARY
GLUCOSE-CAPILLARY: 106 mg/dL — AB (ref 65–99)
Glucose-Capillary: 118 mg/dL — ABNORMAL HIGH (ref 65–99)
Glucose-Capillary: 99 mg/dL (ref 65–99)
Glucose-Capillary: 131 mg/dL — ABNORMAL HIGH (ref 65–99)
Glucose-Capillary: 135 mg/dL — ABNORMAL HIGH (ref 65–99)
Glucose-Capillary: 111 mg/dL — ABNORMAL HIGH (ref 65–99)

## 2014-09-19 LAB — MAGNESIUM: MAGNESIUM: 1.4 mg/dL — AB (ref 1.7–2.4)

## 2014-09-19 LAB — T4, FREE: Free T4: 1.17 ng/dL — ABNORMAL HIGH (ref 0.61–1.12)

## 2014-09-19 MED ORDER — MAGNESIUM SULFATE 2 GM/50ML IV SOLN
2.0000 g | Freq: Once | INTRAVENOUS | Status: AC
  Administered 2014-09-19: 2 g via INTRAVENOUS
  Filled 2014-09-19: qty 50

## 2014-09-19 NOTE — Progress Notes (Signed)
[facility] Progress Note   [name]  QQP:[id number]  DOB: 09/30/1930  DOA: 09/16/2014 PCP: Antony Blackbird, MD  Brief narrative: [name] is a 81 y.o. female who lives at home and has family with her 24 hours with a history of CVA left-sided hemiparesis, hypertension, diabetes mellitus type 2, chronic kidney disease stage III who was brought to the ER for lethargy and confusion. The patient had not eaten much during the day due to lethargy and she was found to have a glucose of 50s. She is given IV dextrose and sugar improved however it dropped again. She was also noted to have of UA consistent with UTI. Notes in epic revealed that she was treated with a ten-day course of Macrobid which was completed 3 days ago. Culture results revealed Escherichia coli UTI-sensitivities not mentioned in notes.   Subjective: Patient has coughing spells while eating, modified heart healthy/carb modified diet to pureed She has no complaints of nausea vomiting abdominal pain shortness of breath or chest pain.  Assessment/Plan: Principal Problem:   Hypoglycemia - diabetes mellitus type 2 SECONDARY TO poor oral intake-cbg normal, holding NPH,currently only on Sliding scale insulin   Lethargy -Possibly dehydration and related to UTI -appears quite alert now-we'll continue IV hydration and monitor by mouth intake-limited insulin to prevent further hypoglycemia as mentioned above-urine culture pending   UTI -UA grossly positive with confusion and lethargy -Completed a 10 day course of Macrobid 3 days ago-currently on Rocephin-follow urine culture Urine culture shows Escherichia coli greater than 100,000 colonies, sensitivity pending, continue Rocephin   Dehydration -Based on elevated BUN/creatinine ratio ---Continue to hydrate and encourage oral fluids  Left knee pain -No swelling or tenderness noted on exam-she has received oxycodone-will obtain x-rays of left knee     Hypothyroidism -Continue Synthroid , TSH 6.52, check free T4    Essential hypertension -Indapamide on hold-  would not treat her blood pressure aggressively at her age and therefore would not resume indapamide on discharge     Anemia in CKD (chronic kidney disease) -According to notes from nursing facility stay she did recently receive 2 units of packed red blood cells -Follows with hematology as outpatient-received 2 units of packed red blood cells on admission bringing hemoglobin above 9- 8.7 -Records in epic show that she last saw her hematologist last month -Her hematologist has been notified on 8/29 that she is hospitalized in case she would like to see her     CKD (chronic kidney disease) stage 3??? -Creatinine normalized with hydration with GFR now being greater than 60 therefore does not have CK D3    CVA (cerebral infarction) -Right-sided weakness    Hypokalemia Replete magnesium    Pressure ulcer -on heels have re-epithelialized  Dementia - No behavioral disturbances , PT OT eval, social work consult, speech therapy consult   Social issues: According to case manager, patient lives at home alone and is not being adequately supervised by family members. Spoke with her daughter yesterday who states that they check on her regularly and that she is never alone. T  Would not discharge patient to go back home. Follow-up on PT eval.   Code Status:     Code Status Orders        Start     Ordered   09/17/14 0036  Full code   Continuous     09/17/14 0035    Advance Directive Documentation        Most Recent Value   Type of  Advance Directive  Healthcare Power of Attorney   Pre-existing out of facility DNR order (yellow form or pink MOST form)     "MOST" Form in Place?       Family Communication: With daughter, [name] on 8/29 Disposition Plan: Pending DVT prophylaxis: Lovenox Consultants: Procedures: Appt with PCP: Requested for next  week Antibiotics: Anti-infectives    Start     Dose/Rate Route Frequency Ordered Stop   09/17/14 2200  cefTRIAXone (ROCEPHIN) 1 g in dextrose 5 % 50 mL IVPB     1 g 100 mL/hr over 30 Minutes Intravenous Every 24 hours 09/17/14 0015     09/16/14 2215  cefTRIAXone (ROCEPHIN) 1 g in dextrose 5 % 50 mL IVPB     1 g 100 mL/hr over 30 Minutes Intravenous  Once 09/16/14 2207 09/16/14 2327      Objective: Filed Weights   09/16/14 2148 09/17/14 0045  Weight: 74.844 kg (165 lb) 70.8 kg (156 lb 1.4 oz)    Intake/Output Summary (Last 24 hours) at 09/19/14 1058 Last data filed at 09/19/14 0900  Gross per 24 hour  Intake    670 ml  Output      2 ml  Net    668 ml     Vitals Filed Vitals:   09/18/14 1324 09/18/14 2034 09/19/14 0038 09/19/14 0459  BP: 156/90 162/77 140/65 171/72  Pulse: 77 61 60 68  Temp: 98.5 F (36.9 C) 98.5 F (36.9 C) 98.1 F (36.7 C) 98.2 F (36.8 C)  TempSrc: Oral Oral Oral Oral  Resp: 18 19 18 18   Height:      Weight:      SpO2: 100% 98% 100% 100%    Exam:  General:  Pt is alert, confused about time and situation  HEENT: No icterus, No thrush, oral mucosa moist  Cardiovascular: regular rate and rhythm, S1/S2 No murmur  Respiratory: clear to auscultation bilaterally   Abdomen: Soft, +Bowel sounds, non tender, non distended, no guarding  MSK: No LE edema, cyanosis or clubbing- no tenderness in left knee or right knee  Data Reviewed: Basic Metabolic Panel:  Recent Labs Lab 09/16/14 2130 09/17/14 0146 09/18/14 0444 09/18/14 0500 09/19/14 0552  NA 135 136 136  --  136  K 3.2* 3.4* 3.3*  --  4.4  CL 99* 97* 97*  --  98*  CO2 30 29 30   --  30  GLUCOSE 90 90 104*  --  115*  BUN 22* 23* 20  --  16  CREATININE 0.94 0.99 0.81  --  0.79  CALCIUM 8.8* 9.0 8.9  --  9.2  MG  --   --   --  1.4* 1.4*   Liver Function Tests:  Recent Labs Lab 09/16/14 2130  AST 17  ALT 10*  ALKPHOS 65  BILITOT 0.6  PROT 6.5  ALBUMIN 2.3*   No results for  input(s): LIPASE, AMYLASE in the last 168 hours. No results for input(s): AMMONIA in the last 168 hours. CBC:  Recent Labs Lab 09/16/14 2130 09/17/14 0146 09/17/14 0900 09/18/14 0444  WBC 7.9 7.4  --  6.8  HGB 7.1* 7.7* 9.7* 8.7*  HCT 23.3* 25.0* 30.5* 28.0*  MCV 75.9* 76.9*  --  76.9*  PLT 338 319  --  300   Cardiac Enzymes: No results for input(s): CKTOTAL, CKMB, CKMBINDEX, TROPONINI in the last 168 hours. BNP (last 3 results) No results for input(s): BNP in the last 8760 hours.  ProBNP (last 3  results) No results for input(s): PROBNP in the last 8760 hours.  CBG:  Recent Labs Lab 09/18/14 1625 09/18/14 2038 09/19/14 0043 09/19/14 0503 09/19/14 0752  GLUCAP 121* 97 106* 118* 99    Recent Results (from the past 240 hour(s))  Urine culture     Status: None (Preliminary result)   Collection Time: 09/16/14  9:22 PM  Result Value Ref Range Status   Specimen Description URINE, CATHETERIZED  Final   Special Requests NONE  Final   Culture   Final    >=100,000 COLONIES/mL ESCHERICHIA COLI CULTURE REINCUBATED FOR BETTER GROWTH Performed at [facility]    Report Status PENDING  Incomplete  MRSA PCR Screening     Status: None   Collection Time: 09/17/14  1:45 AM  Result Value Ref Range Status   MRSA by PCR NEGATIVE NEGATIVE Final    Comment:        The GeneXpert MRSA Assay (FDA approved for NASAL specimens only), is one component of a comprehensive MRSA colonization surveillance program. It is not intended to diagnose MRSA infection nor to guide or monitor treatment for MRSA infections.      Studies: No results found.  Scheduled Meds:  Scheduled Meds: . sodium chloride   Intravenous Once  . calcium carbonate  1 tablet Oral BID WC  . cefTRIAXone (ROCEPHIN)  IV  1 g Intravenous Q24H  . cholecalciferol  2,000 Units Oral Daily  . clopidogrel  75 mg Oral Q breakfast  . docusate sodium  100 mg Oral BID  . enoxaparin (LOVENOX) injection  40 mg  Subcutaneous Q24H  . ferrous sulfate  325 mg Oral BID PC  . insulin aspart  0-9 Units Subcutaneous 6 times per day  . levothyroxine  112 mcg Oral QAC breakfast  . LORazepam  0.5 mg Oral TID  . magnesium sulfate 1 - 4 g bolus IVPB  2 g Intravenous Once  . PARoxetine  10 mg Oral Daily  . rOPINIRole  3 mg Oral QHS  . saccharomyces boulardii  250 mg Oral BID  . sodium chloride  500 mL Intravenous Once  . sodium chloride  3 mL Intravenous Q12H  . vitamin B-12  1,000 mcg Oral Daily  . vitamin C  1,000 mg Oral Daily   Continuous Infusions: . sodium chloride 20 mL/hr (09/16/14 2133)    Time spent on care of this patient: 35 min   Dashun Borre, MD 09/19/2014, 10:58 AM  LOS: 3 days   [facility] Office  [phone] Pager - Text Page per [email or web address] If 7PM-7AM, please contact night-coverage [email or web address]

## 2014-09-19 NOTE — Evaluation (Signed)
SLP Cancellation Note  Patient Details Name: [name] MRN: [id number] DOB: Jan 18, 1931   Cancelled treatment:       Reason Eval/Treat Not Completed: Patient at procedure or test/unavailable (pt being taken to xray for evaluation, will reattempt eval at later time)   Luanna Salk, [facility] SLP [phone]

## 2014-09-19 NOTE — Consult Note (Signed)
   [facility] CM Inpatient Consult   09/19/2014  [name] 1930-11-25 [id number]   Patient evaluated for [place] Management services. Noted patient to go to SNF at discharge. Will engage for services if disposition plans changes. Made inpatient RNCM aware.  Marthenia Rolling, MSN-Ed, RN,BSN [facility] Liaison [phone]

## 2014-09-19 NOTE — Clinical Social Work Placement (Signed)
CSW spoke with patient's daughter, [name] to confirm that she has chosen [facility] for patient when stable for discharge. Anticipating possible discharge tomorrow. Katharine Look at [facility] aware.     Raynaldo Opitz, [facility] Clinical Social Worker cell #: [phone]    CLINICAL SOCIAL WORK PLACEMENT  NOTE  Date:  09/19/2014  Patient Details  Name: [name] MRN: [id number] Date of Birth: 1931/01/10  Clinical Social Work is seeking post-discharge placement for this patient at the [place] level of care (*CSW will initial, date and re-position this form in  chart as items are completed):  Yes   Patient/family provided with [place] Work Department's list of facilities offering this level of care within the geographic area requested by the patient (or if unable, by the patient's family).  Yes   Patient/family informed of their freedom to choose among providers that offer the needed level of care, that participate in Medicare, Medicaid or managed care program needed by the patient, have an available bed and are willing to accept the patient.  Yes   Patient/family informed of [facility]'s ownership interest in [facility] and [facility], as well as of the fact that they are under no obligation to receive care at these facilities.  PASRR submitted to EDS on       PASRR number received on       Existing PASRR number confirmed on 09/18/14     FL2 transmitted to all facilities in geographic area requested by pt/family on 09/18/14     FL2 transmitted to all facilities within larger geographic area on       Patient informed that his/her managed care company has contracts with or will negotiate with certain facilities, including the following:        Yes   Patient/family informed of bed offers received.  Patient chooses bed at [place] recommends and patient chooses bed at       Patient to be transferred to [facility] on  .  Patient to be transferred to facility by       Patient family notified on   of transfer.  Name of family member notified:        PHYSICIAN       Additional Comment:    _______________________________________________ Standley Brooking, LCSW 09/19/2014, 1:35 PM

## 2014-09-19 NOTE — Progress Notes (Signed)
Patient has coughing spells while eating, modified heart healthy/carb modified diet to pureed. Please be aware patient may need to be evaluated for swallowing capacity.

## 2014-09-19 NOTE — Progress Notes (Signed)
PT Cancellation Note  Patient Details Name: [name] MRN: [id number] DOB: [birth year]   Cancelled Treatment:    Reason Eval/Treat Not Completed: Patient at procedure or test/unavailable. Will check back another time. Thanks.   Weston Anna, MPT Pager: [phone]

## 2014-09-20 ENCOUNTER — Non-Acute Institutional Stay (SKILLED_NURSING_FACILITY): Payer: Medicare Other | Admitting: Internal Medicine

## 2014-09-20 DIAGNOSIS — E162 Hypoglycemia, unspecified: Secondary | ICD-10-CM

## 2014-09-20 DIAGNOSIS — N3000 Acute cystitis without hematuria: Secondary | ICD-10-CM

## 2014-09-20 DIAGNOSIS — E1122 Type 2 diabetes mellitus with diabetic chronic kidney disease: Secondary | ICD-10-CM

## 2014-09-20 DIAGNOSIS — R1311 Dysphagia, oral phase: Secondary | ICD-10-CM | POA: Diagnosis not present

## 2014-09-20 DIAGNOSIS — N390 Urinary tract infection, site not specified: Secondary | ICD-10-CM | POA: Diagnosis not present

## 2014-09-20 DIAGNOSIS — F0390 Unspecified dementia without behavioral disturbance: Secondary | ICD-10-CM | POA: Diagnosis not present

## 2014-09-20 DIAGNOSIS — E039 Hypothyroidism, unspecified: Secondary | ICD-10-CM | POA: Diagnosis not present

## 2014-09-20 DIAGNOSIS — N189 Chronic kidney disease, unspecified: Secondary | ICD-10-CM | POA: Diagnosis not present

## 2014-09-20 DIAGNOSIS — I6330 Cerebral infarction due to thrombosis of unspecified cerebral artery: Secondary | ICD-10-CM

## 2014-09-20 DIAGNOSIS — N183 Chronic kidney disease, stage 3 unspecified: Secondary | ICD-10-CM

## 2014-09-20 DIAGNOSIS — E559 Vitamin D deficiency, unspecified: Secondary | ICD-10-CM | POA: Diagnosis not present

## 2014-09-20 DIAGNOSIS — D631 Anemia in chronic kidney disease: Secondary | ICD-10-CM | POA: Diagnosis not present

## 2014-09-20 DIAGNOSIS — Z794 Long term (current) use of insulin: Secondary | ICD-10-CM | POA: Diagnosis not present

## 2014-09-20 DIAGNOSIS — B9620 Unspecified Escherichia coli [E. coli] as the cause of diseases classified elsewhere: Secondary | ICD-10-CM | POA: Diagnosis not present

## 2014-09-20 DIAGNOSIS — E038 Other specified hypothyroidism: Secondary | ICD-10-CM

## 2014-09-20 DIAGNOSIS — I10 Essential (primary) hypertension: Secondary | ICD-10-CM

## 2014-09-20 DIAGNOSIS — Z7901 Long term (current) use of anticoagulants: Secondary | ICD-10-CM | POA: Diagnosis not present

## 2014-09-20 DIAGNOSIS — I69354 Hemiplegia and hemiparesis following cerebral infarction affecting left non-dominant side: Secondary | ICD-10-CM | POA: Diagnosis not present

## 2014-09-20 DIAGNOSIS — M1712 Unilateral primary osteoarthritis, left knee: Secondary | ICD-10-CM | POA: Diagnosis not present

## 2014-09-20 DIAGNOSIS — F419 Anxiety disorder, unspecified: Secondary | ICD-10-CM | POA: Diagnosis not present

## 2014-09-20 DIAGNOSIS — I6789 Other cerebrovascular disease: Secondary | ICD-10-CM | POA: Diagnosis not present

## 2014-09-20 DIAGNOSIS — E11649 Type 2 diabetes mellitus with hypoglycemia without coma: Secondary | ICD-10-CM | POA: Diagnosis not present

## 2014-09-20 DIAGNOSIS — E034 Atrophy of thyroid (acquired): Secondary | ICD-10-CM | POA: Diagnosis not present

## 2014-09-20 DIAGNOSIS — I129 Hypertensive chronic kidney disease with stage 1 through stage 4 chronic kidney disease, or unspecified chronic kidney disease: Secondary | ICD-10-CM | POA: Diagnosis not present

## 2014-09-20 DIAGNOSIS — E876 Hypokalemia: Secondary | ICD-10-CM | POA: Diagnosis not present

## 2014-09-20 DIAGNOSIS — M6281 Muscle weakness (generalized): Secondary | ICD-10-CM | POA: Diagnosis not present

## 2014-09-20 LAB — CBC
HEMATOCRIT: 29.1 % — AB (ref 36.0–46.0)
HEMOGLOBIN: 9.1 g/dL — AB (ref 12.0–15.0)
MCH: 24.4 pg — ABNORMAL LOW (ref 26.0–34.0)
MCHC: 31.3 g/dL (ref 30.0–36.0)
MCV: 78 fL (ref 78.0–100.0)
PLATELETS: 319 10*3/uL (ref 150–400)
RBC: 3.73 MIL/uL — AB (ref 3.87–5.11)
RDW: 16.6 % — ABNORMAL HIGH (ref 11.5–15.5)
WBC: 6.2 10*3/uL (ref 4.0–10.5)

## 2014-09-20 LAB — COMPREHENSIVE METABOLIC PANEL
ALT: 7 U/L — ABNORMAL LOW (ref 14–54)
ANION GAP: 7 (ref 5–15)
AST: 16 U/L (ref 15–41)
Albumin: 2.3 g/dL — ABNORMAL LOW (ref 3.5–5.0)
Alkaline Phosphatase: 66 U/L (ref 38–126)
BUN: 15 mg/dL (ref 6–20)
CHLORIDE: 97 mmol/L — AB (ref 101–111)
CO2: 30 mmol/L (ref 22–32)
CREATININE: 0.82 mg/dL (ref 0.44–1.00)
Calcium: 9.3 mg/dL (ref 8.9–10.3)
Glucose, Bld: 108 mg/dL — ABNORMAL HIGH (ref 65–99)
POTASSIUM: 4.1 mmol/L (ref 3.5–5.1)
SODIUM: 134 mmol/L — AB (ref 135–145)
Total Bilirubin: 0.6 mg/dL (ref 0.3–1.2)
Total Protein: 6.1 g/dL — ABNORMAL LOW (ref 6.5–8.1)

## 2014-09-20 LAB — URINE CULTURE: Culture: >=100000

## 2014-09-20 LAB — GLUCOSE, CAPILLARY
GLUCOSE-CAPILLARY: 105 mg/dL — AB (ref 65–99)
GLUCOSE-CAPILLARY: 107 mg/dL — AB (ref 65–99)
Glucose-Capillary: 100 mg/dL — ABNORMAL HIGH (ref 65–99)
Glucose-Capillary: 107 mg/dL — ABNORMAL HIGH (ref 65–99)

## 2014-09-20 MED ORDER — HYDROCODONE-ACETAMINOPHEN 10-325 MG PO TABS: 1.0000 | ORAL_TABLET | Freq: Two times a day (BID) | ORAL | Status: DC

## 2014-09-20 MED ORDER — CEFDINIR 300 MG PO CAPS: 300.0000 mg | ORAL_CAPSULE | Freq: Two times a day (BID) | ORAL | Status: DC

## 2014-09-20 MED ORDER — SACCHAROMYCES BOULARDII 250 MG PO CAPS: 250.0000 mg | ORAL_CAPSULE | Freq: Two times a day (BID) | ORAL | Status: AC

## 2014-09-20 MED ORDER — LORAZEPAM 0.5 MG PO TABS: 0.5000 mg | ORAL_TABLET | Freq: Three times a day (TID) | ORAL | Status: DC | PRN

## 2014-09-20 NOTE — Clinical Social Work Placement (Signed)
Patient is set to discharge to [facility] today. Patient & daughter, [name] aware. Discharge packet given to RN, Susie. [facility] called for transport.     Raynaldo Opitz, [facility] Clinical Social Worker cell #: [phone]    CLINICAL SOCIAL WORK PLACEMENT  NOTE  Date:  09/20/2014  Patient Details  Name: Kolbi Tofte MRN: [id number] Date of Birth: 08/13/1930  Clinical Social Work is seeking post-discharge placement for this patient at the [place] level of care (*CSW will initial, date and re-position this form in  chart as items are completed):  Yes   Patient/family provided with [place] Work Department's list of facilities offering this level of care within the geographic area requested by the patient (or if unable, by the patient's family).  Yes   Patient/family informed of their freedom to choose among providers that offer the needed level of care, that participate in Medicare, Medicaid or managed care program needed by the patient, have an available bed and are willing to accept the patient.  Yes   Patient/family informed of [facility]'s ownership interest in [facility] and [facility], as well as of the fact that they are under no obligation to receive care at these facilities.  PASRR submitted to EDS on       PASRR number received on       Existing PASRR number confirmed on 09/18/14     FL2 transmitted to all facilities in geographic area requested by pt/family on 09/18/14     FL2 transmitted to all facilities within larger geographic area on       Patient informed that his/her managed care company has contracts with or will negotiate with certain facilities, including the following:        Yes   Patient/family informed of bed offers received.  Patient chooses bed at [place] recommends and patient chooses bed at      Patient to be transferred to [facility] on 09/20/14.  Patient to be transferred to facility by [facility]     Patient family notified on 09/20/14 of transfer.  Name of family member notified:  patient's daughter, [name] via phone     PHYSICIAN       Additional Comment:    _______________________________________________ Standley Brooking, LCSW 09/20/2014, 2:30 PM

## 2014-09-20 NOTE — Discharge Summary (Signed)
Physician Discharge Summary  [name] MRN: [id number] DOB/AGE: December 01, 1930 81 y.o.  PCP: Antony Blackbird, MD   Admit date: 09/16/2014 Discharge date: 09/20/2014  Discharge Diagnoses:     Principal Problem:   Hypoglycemia Active Problems:   Hypothyroidism   Essential hypertension   Anemia in CKD (chronic kidney disease)   CKD (chronic kidney disease) stage 3, GFR 30-59 ml/min   DM2 (diabetes mellitus, type 2)   CVA (cerebral infarction)   Hypokalemia   Urinary tract infectious disease   Pressure ulcer    Follow-up recommendations Follow-up with PCP in 3-5 days , including all  additional recommended appointments as below Follow-up CBC, CMP in 3-5 days      Medication List    STOP taking these medications        indapamide 1.25 MG tablet  Commonly known as:  LOZOL     insulin NPH Human 100 UNIT/ML injection  Commonly known as:  HUMULIN N,NOVOLIN N     nitrofurantoin (macrocrystal-monohydrate) 100 MG capsule  Commonly known as:  MACROBID      TAKE these medications        CALCIUM 600 600 MG Tabs tablet  Generic drug:  calcium carbonate  Take 600 mg by mouth 2 (two) times daily with a meal.     cefdinir 300 MG capsule  Commonly known as:  OMNICEF  Take 1 capsule (300 mg total) by mouth 2 (two) times daily.     clopidogrel 75 MG tablet  Commonly known as:  PLAVIX  Take 1 tablet (75 mg total) by mouth daily.     cyanocobalamin 1000 MCG tablet  Take 1 tablet (1,000 mcg total) by mouth daily.     ferrous sulfate 325 (65 FE) MG tablet  Take 325 mg by mouth 2 (two) times daily with a meal.     HYDROcodone-acetaminophen 10-325 MG per tablet  Commonly known as:  NORCO  Take 1 tablet by mouth 2 (two) times daily.     insulin lispro 100 UNIT/ML injection  Commonly known as:  HUMALOG  Inject 0-9 Units into the skin 3 (three) times daily. Sliding scale: 121-150 = 1 unit, 151-200 = 2 units, 201-250 = 3 units, 251-300 = 5 units, 301-350 = 7 units, 351-400 = 9  units   Do not use SSI for blood sugars <250 for breakfast     levothyroxine 112 MCG tablet  Commonly known as:  SYNTHROID, LEVOTHROID  Take 112 mcg by mouth daily before breakfast.     LORazepam 0.5 MG tablet  Commonly known as:  ATIVAN  Take 1 tablet (0.5 mg total) by mouth every 8 (eight) hours as needed for anxiety.     PARoxetine 10 MG tablet  Commonly known as:  PAXIL  Take 10 mg by mouth daily.     rOPINIRole 3 MG tablet  Commonly known as:  REQUIP  Take 3 mg by mouth at bedtime.     saccharomyces boulardii 250 MG capsule  Commonly known as:  FLORASTOR  Take 1 capsule (250 mg total) by mouth 2 (two) times daily.     vitamin C 1000 MG tablet  Take 1,000 mg by mouth daily.     Vitamin D 2000 UNITS tablet  Take 2,000 Units by mouth daily.         Discharge Condition:     Disposition: 03-Skilled Nursing Facility   Consults: None     Significant Diagnostic Studies:  Dg Knee Complete 4 Views Left  09/19/2014  CLINICAL DATA:  Generalized left knee pain.  No recent injury.  EXAM: LEFT KNEE - COMPLETE 4+ VIEW  COMPARISON:  None.  FINDINGS: Extensive sclerosis around the narrowed and spurred medial more than lateral compartments consistent with advanced osteoarthritis. Mild genu varus with lateral and posterior tibial plateau translation. Patellofemoral spurring is also advanced. Probable moderate knee joint effusion. 15 mm bone fragment lateral to the femoral condyles may reflect intra-articular body. No acute fracture.  IMPRESSION: Severe knee osteoarthritis with lateral and posterior tibial translation.   Electronically Signed   By: Monte Fantasia M.D.   On: 09/19/2014 13:51   Dg Hip Unilat With Pelvis 2-3 Views Left  09/19/2014   CLINICAL DATA:  Generalized left knee and hip pain. No recent injury.  EXAM: DG HIP (WITH OR WITHOUT PELVIS) 2-3V LEFT  COMPARISON:  None.  FINDINGS: There is loss of joint space at both hips. Left hip is located. Degenerative changes are  evident. No acute abnormality is present. The pelvis is intact. Degenerative changes in the lower lumbar spine are most evident at L3-4. Atherosclerotic changes are present at the aorta.  IMPRESSION: 1. Mild degenerative changes within both hips. 2. No acute abnormality. 3. Degenerative changes in the lumbar spine. 4. Atherosclerosis.   Electronically Signed   By: San Morelle M.D.   On: 09/19/2014 13:58      Filed Weights   09/16/14 2148 09/17/14 0045  Weight: 74.844 kg (165 lb) 70.8 kg (156 lb 1.4 oz)     Microbiology: Recent Results (from the past 240 hour(s))  Urine culture     Status: None   Collection Time: 09/16/14  9:22 PM  Result Value Ref Range Status   Specimen Description URINE, CATHETERIZED  Final   Special Requests NONE  Final   Culture   Final    >=100,000 COLONIES/mL ESCHERICHIA COLI Performed at [facility]    Report Status 09/20/2014 FINAL  Final   Organism ID, Bacteria ESCHERICHIA COLI  Final      Susceptibility   Escherichia coli - MIC*    AMPICILLIN >=32 RESISTANT Resistant     CEFAZOLIN <=4 SENSITIVE Sensitive     CEFTRIAXONE <=1 SENSITIVE Sensitive     CIPROFLOXACIN >=4 RESISTANT Resistant     GENTAMICIN <=1 SENSITIVE Sensitive     NITROFURANTOIN <=16 SENSITIVE Sensitive     AMPICILLIN/SULBACTAM 8 SENSITIVE Sensitive     PIP/TAZO <=4 SENSITIVE Sensitive     * >=100,000 COLONIES/mL ESCHERICHIA COLI  MRSA PCR Screening     Status: None   Collection Time: 09/17/14  1:45 AM  Result Value Ref Range Status   MRSA by PCR NEGATIVE NEGATIVE Final    Comment:        The GeneXpert MRSA Assay (FDA approved for NASAL specimens only), is one component of a comprehensive MRSA colonization surveillance program. It is not intended to diagnose MRSA infection nor to guide or monitor treatment for MRSA infections.        Blood Culture    Component Value Date/Time   [facility] URINE, CATHETERIZED 09/16/2014 2122   [place] NONE 09/16/2014 2122    CULT  09/16/2014 2122    >=100,000 COLONIES/mL ESCHERICHIA COLI Performed at [place] 09/20/2014 FINAL 09/16/2014 2122      Labs: Results for orders placed or performed during the hospital encounter of 09/16/14 (from the past 48 hour(s))  Glucose, capillary     Status: Abnormal   Collection Time: 09/18/14  4:25 PM  Result Value Ref Range   Glucose-Capillary 121 (H) 65 - 99 mg/dL  Glucose, capillary     Status: None   Collection Time: 09/18/14  8:38 PM  Result Value Ref Range   Glucose-Capillary 97 65 - 99 mg/dL  Glucose, capillary     Status: Abnormal   Collection Time: 09/19/14 12:43 AM  Result Value Ref Range   Glucose-Capillary 106 (H) 65 - 99 mg/dL  Glucose, capillary     Status: Abnormal   Collection Time: 09/19/14  5:03 AM  Result Value Ref Range   Glucose-Capillary 118 (H) 65 - 99 mg/dL  Basic metabolic panel     Status: Abnormal   Collection Time: 09/19/14  5:52 AM  Result Value Ref Range   Sodium 136 135 - 145 mmol/L   Potassium 4.4 3.5 - 5.1 mmol/L    Comment: DELTA CHECK NOTED REPEATED TO VERIFY NO VISIBLE HEMOLYSIS    Chloride 98 (L) 101 - 111 mmol/L   CO2 30 22 - 32 mmol/L   Glucose, Bld 115 (H) 65 - 99 mg/dL   BUN 16 6 - 20 mg/dL   Creatinine, Ser 0.79 0.44 - 1.00 mg/dL   Calcium 9.2 8.9 - 10.3 mg/dL   GFR calc non Af Amer >60 >60 mL/min   GFR calc Af Amer >60 >60 mL/min    Comment: (NOTE) The eGFR has been calculated using the CKD EPI equation. This calculation has not been validated in all clinical situations. eGFR's persistently <60 mL/min signify possible Chronic Kidney Disease.    Anion gap 8 5 - 15  Magnesium     Status: Abnormal   Collection Time: 09/19/14  5:52 AM  Result Value Ref Range   Magnesium 1.4 (L) 1.7 - 2.4 mg/dL  Glucose, capillary     Status: None   Collection Time: 09/19/14  7:52 AM  Result Value Ref Range   Glucose-Capillary 99 65 - 99 mg/dL  T4, free     Status: Abnormal   Collection Time:  09/19/14 11:43 AM  Result Value Ref Range   Free T4 1.17 (H) 0.61 - 1.12 ng/dL    Comment: Performed at [facility]  Glucose, capillary     Status: Abnormal   Collection Time: 09/19/14 12:45 PM  Result Value Ref Range   Glucose-Capillary 131 (H) 65 - 99 mg/dL  Glucose, capillary     Status: Abnormal   Collection Time: 09/19/14  4:24 PM  Result Value Ref Range   Glucose-Capillary 135 (H) 65 - 99 mg/dL  Glucose, capillary     Status: Abnormal   Collection Time: 09/19/14  8:16 PM  Result Value Ref Range   Glucose-Capillary 111 (H) 65 - 99 mg/dL   Comment 1 Notify RN    Comment 2 Document in Chart   Glucose, capillary     Status: Abnormal   Collection Time: 09/20/14 12:51 AM  Result Value Ref Range   Glucose-Capillary 100 (H) 65 - 99 mg/dL   Comment 1 Notify RN    Comment 2 Document in Chart   Glucose, capillary     Status: Abnormal   Collection Time: 09/20/14  4:41 AM  Result Value Ref Range   Glucose-Capillary 105 (H) 65 - 99 mg/dL   Comment 1 Notify RN    Comment 2 Document in Chart   Comprehensive metabolic panel     Status: Abnormal   Collection Time: 09/20/14  5:34 AM  Result Value Ref Range   Sodium 134 (L) 135 -  145 mmol/L   Potassium 4.1 3.5 - 5.1 mmol/L   Chloride 97 (L) 101 - 111 mmol/L   CO2 30 22 - 32 mmol/L   Glucose, Bld 108 (H) 65 - 99 mg/dL   BUN 15 6 - 20 mg/dL   Creatinine, Ser 0.82 0.44 - 1.00 mg/dL   Calcium 9.3 8.9 - 10.3 mg/dL   Total Protein 6.1 (L) 6.5 - 8.1 g/dL   Albumin 2.3 (L) 3.5 - 5.0 g/dL   AST 16 15 - 41 U/L   ALT 7 (L) 14 - 54 U/L   Alkaline Phosphatase 66 38 - 126 U/L   Total Bilirubin 0.6 0.3 - 1.2 mg/dL   GFR calc non Af Amer >60 >60 mL/min   GFR calc Af Amer >60 >60 mL/min    Comment: (NOTE) The eGFR has been calculated using the CKD EPI equation. This calculation has not been validated in all clinical situations. eGFR's persistently <60 mL/min signify possible Chronic Kidney Disease.    Anion gap 7 5 - 15  CBC      Status: Abnormal   Collection Time: 09/20/14  5:34 AM  Result Value Ref Range   WBC 6.2 4.0 - 10.5 K/uL   RBC 3.73 (L) 3.87 - 5.11 MIL/uL   Hemoglobin 9.1 (L) 12.0 - 15.0 g/dL   HCT 29.1 (L) 36.0 - 46.0 %   MCV 78.0 78.0 - 100.0 fL   MCH 24.4 (L) 26.0 - 34.0 pg   MCHC 31.3 30.0 - 36.0 g/dL   RDW 16.6 (H) 11.5 - 15.5 %   Platelets 319 150 - 400 K/uL  Glucose, capillary     Status: Abnormal   Collection Time: 09/20/14  7:40 AM  Result Value Ref Range   Glucose-Capillary 107 (H) 65 - 99 mg/dL   Comment 1 Notify RN    Comment 2 Document in Chart   Glucose, capillary     Status: Abnormal   Collection Time: 09/20/14 11:57 AM  Result Value Ref Range   Glucose-Capillary 107 (H) 65 - 99 mg/dL   Comment 1 Notify RN    Comment 2 Document in Chart      Lipid Panel     Component Value Date/Time   CHOL 110 07/07/2014 0500   TRIG 89 07/07/2014 0500   HDL 37* 07/07/2014 0500   CHOLHDL 3.0 07/07/2014 0500   VLDL 18 07/07/2014 0500   LDLCALC 55 07/07/2014 0500     Lab Results  Component Value Date   HGBA1C 5.4 07/05/2014   HGBA1C 5.1 04/18/2014   HGBA1C 6.0 12/21/2013     Lab Results  Component Value Date   MICROALBUR 5.1* 04/18/2014   LDLCALC 55 07/07/2014   CREATININE 0.82 09/20/2014     HPI :[name] is a 81 y.o. female who lives at home and has family with her 24 hours with a history of CVA left-sided hemiparesis, hypertension, diabetes mellitus type 2, chronic kidney disease stage III who was brought to the ER for lethargy and confusion. The patient had not eaten much during the day due to lethargy and she was found to have a glucose of 50s. She is given IV dextrose and sugar improved however it dropped again. She was also noted to have of UA consistent with UTI. Notes in epic revealed that she was treated with a ten-day course of Macrobid which was completed 3 days ago. Culture results revealed Escherichia coli UTI-sensitivities not mentioned in notes  HOSPITAL  COURSE:   Hypoglycemia -  diabetes mellitus type 2 SECONDARY TO poor oral intake-cbg normal, holding NPH, continue patient on Sliding scale insulin   Lethargy -Possibly dehydration and related to UTI -appears quite alert now-patient hydrated with IV fluids -limit insulin to prevent further hypoglycemia as mentioned above-  UTI -UA grossly positive with confusion and lethargy -Completed a 10 day course of Macrobid 3 days ago-currently on Rocephin-follow urine culture Urine culture shows Escherichia coli greater than 100,000 colonies, sensitivity as above, initially treated with Rocephin, subsequently switched to Omnicef    Dehydration -Based on elevated BUN/creatinine ratio ---Continue to hydrate and encourage oral fluids  Left knee pain -No swelling or tenderness noted on exam-she has received oxycodone-will obtain x-rays of left knee   Hypothyroidism -Continue Synthroid , TSH 6.52, check free T4   Essential hypertension -Indapamide on hold- would not treat her blood pressure aggressively at her age and therefore would not resume indapamide on discharge    Anemia in CKD (chronic kidney disease) -According to notes from nursing facility stay she did recently receive 2 units of packed red blood cells -Follows with hematology as outpatient-received 2 units of packed red blood cells on admission bringing hemoglobin above 9- 8.7 -Records in epic show that she last saw her hematologist last month Follow CBC   CKD (chronic kidney disease) stage 3??? -Creatinine normalized with hydration with GFR now being greater than 60 therefore does not have CK D3   CVA (cerebral infarction) -Right-sided weakness   Hypokalemia Replete magnesium   Pressure ulcer -on heels have re-epithelialized  Dementia - No behavioral disturbances , PT OT eval, social work consult, speech therapy consult  Discharge Exam: Blood pressure 130/73, pulse 67, temperature 97.5 F (36.4 C),  temperature source Oral, resp. rate 20, height _0  (1.727 m), weight 70.8 kg (156 lb 1.4 oz), SpO2 100 %.   General: Pt is alert, confused about time and situation  HEENT: No icterus, No thrush, oral mucosa moist  Cardiovascular: regular rate and rhythm, S1/S2 No murmur  Respiratory: clear to auscultation bilaterally   Abdomen: Soft, +Bowel sounds, non tender, non distended, no guarding  MSK: No LE edema, cyanosis or clubbing- no tenderness in left knee or right knee       Discharge Instructions    Diet - low sodium heart healthy    Complete by:  As directed      Increase activity slowly    Complete by:  As directed              Signed: Galo Sayed 09/20/2014, 12:49 PM        Time spent >45 mins

## 2014-09-20 NOTE — Progress Notes (Signed)
Report called to [facility]).

## 2014-09-20 NOTE — Care Management Note (Signed)
Case Management Note  Patient Details  Name: Zorina Mallin MRN: [id number] Date of Birth: 03-Jul-1930  Subjective/Objective:                    Action/Plan:d/c snf.   Expected Discharge Date:                  Expected Discharge Plan:  [place]  In-House Referral:  Clinical Social Work  Discharge planning Services  CM Consult  Post Acute Care Choice:  Home [facility]-custodial private duty care services) Choice offered to:     DME Arranged:    DME Agency:     HH Arranged:    [place] Agency:     Status of Service:  Completed, signed off  Medicare Important Message Given:  Yes-second notification given Date Medicare IM Given:    Medicare IM give by:    Date Additional Medicare IM Given:    Additional Medicare Important Message give by:     If discussed at [place] of Stay Meetings, dates discussed:    Additional Comments:  Dessa Phi, RN 09/20/2014, 12:16 PM

## 2014-09-21 DIAGNOSIS — I129 Hypertensive chronic kidney disease with stage 1 through stage 4 chronic kidney disease, or unspecified chronic kidney disease: Secondary | ICD-10-CM | POA: Diagnosis not present

## 2014-09-21 DIAGNOSIS — F0390 Unspecified dementia without behavioral disturbance: Secondary | ICD-10-CM | POA: Diagnosis not present

## 2014-09-21 DIAGNOSIS — F339 Major depressive disorder, recurrent, unspecified: Secondary | ICD-10-CM | POA: Diagnosis not present

## 2014-09-21 DIAGNOSIS — Z794 Long term (current) use of insulin: Secondary | ICD-10-CM | POA: Diagnosis not present

## 2014-09-21 DIAGNOSIS — E1122 Type 2 diabetes mellitus with diabetic chronic kidney disease: Secondary | ICD-10-CM | POA: Diagnosis not present

## 2014-09-21 DIAGNOSIS — Z23 Encounter for immunization: Secondary | ICD-10-CM | POA: Diagnosis not present

## 2014-09-21 DIAGNOSIS — R0989 Other specified symptoms and signs involving the circulatory and respiratory systems: Secondary | ICD-10-CM | POA: Diagnosis not present

## 2014-09-21 DIAGNOSIS — E038 Other specified hypothyroidism: Secondary | ICD-10-CM | POA: Diagnosis not present

## 2014-09-21 DIAGNOSIS — N189 Chronic kidney disease, unspecified: Secondary | ICD-10-CM | POA: Diagnosis not present

## 2014-09-21 DIAGNOSIS — I69854 Hemiplegia and hemiparesis following other cerebrovascular disease affecting left non-dominant side: Secondary | ICD-10-CM | POA: Diagnosis not present

## 2014-09-21 DIAGNOSIS — L8995 Pressure ulcer of unspecified site, unstageable: Secondary | ICD-10-CM | POA: Diagnosis not present

## 2014-09-21 DIAGNOSIS — I639 Cerebral infarction, unspecified: Secondary | ICD-10-CM | POA: Diagnosis not present

## 2014-09-21 DIAGNOSIS — E039 Hypothyroidism, unspecified: Secondary | ICD-10-CM | POA: Diagnosis not present

## 2014-09-21 DIAGNOSIS — F419 Anxiety disorder, unspecified: Secondary | ICD-10-CM | POA: Diagnosis not present

## 2014-09-21 DIAGNOSIS — Z029 Encounter for administrative examinations, unspecified: Secondary | ICD-10-CM | POA: Diagnosis present

## 2014-09-21 DIAGNOSIS — F0391 Unspecified dementia with behavioral disturbance: Secondary | ICD-10-CM | POA: Diagnosis not present

## 2014-09-21 DIAGNOSIS — D509 Iron deficiency anemia, unspecified: Secondary | ICD-10-CM | POA: Diagnosis not present

## 2014-09-21 DIAGNOSIS — K121 Other forms of stomatitis: Secondary | ICD-10-CM | POA: Diagnosis not present

## 2014-09-21 DIAGNOSIS — N183 Chronic kidney disease, stage 3 (moderate): Secondary | ICD-10-CM | POA: Diagnosis not present

## 2014-09-21 DIAGNOSIS — K5901 Slow transit constipation: Secondary | ICD-10-CM | POA: Diagnosis not present

## 2014-09-21 DIAGNOSIS — I6330 Cerebral infarction due to thrombosis of unspecified cerebral artery: Secondary | ICD-10-CM | POA: Diagnosis not present

## 2014-09-21 DIAGNOSIS — E162 Hypoglycemia, unspecified: Secondary | ICD-10-CM | POA: Diagnosis not present

## 2014-09-21 DIAGNOSIS — D631 Anemia in chronic kidney disease: Secondary | ICD-10-CM | POA: Diagnosis not present

## 2014-09-21 DIAGNOSIS — T17320D Food in larynx causing asphyxiation, subsequent encounter: Secondary | ICD-10-CM | POA: Diagnosis not present

## 2014-09-21 DIAGNOSIS — I10 Essential (primary) hypertension: Secondary | ICD-10-CM | POA: Diagnosis not present

## 2014-09-21 DIAGNOSIS — E034 Atrophy of thyroid (acquired): Secondary | ICD-10-CM | POA: Diagnosis not present

## 2014-09-21 DIAGNOSIS — G2581 Restless legs syndrome: Secondary | ICD-10-CM | POA: Diagnosis not present

## 2014-09-21 DIAGNOSIS — G40501 Epileptic seizures related to external causes, not intractable, with status epilepticus: Secondary | ICD-10-CM | POA: Diagnosis not present

## 2014-09-21 DIAGNOSIS — F418 Other specified anxiety disorders: Secondary | ICD-10-CM | POA: Diagnosis not present

## 2014-09-21 DIAGNOSIS — N390 Urinary tract infection, site not specified: Secondary | ICD-10-CM | POA: Diagnosis not present

## 2014-09-24 ENCOUNTER — Other Ambulatory Visit: Payer: Self-pay | Admitting: Oncology

## 2014-09-24 ENCOUNTER — Encounter: Payer: Self-pay | Admitting: Internal Medicine

## 2014-09-24 ENCOUNTER — Non-Acute Institutional Stay: Payer: Medicare Other | Admitting: Internal Medicine

## 2014-09-24 ENCOUNTER — Non-Acute Institutional Stay (SKILLED_NURSING_FACILITY): Payer: Medicare Other | Admitting: Adult Health

## 2014-09-24 DIAGNOSIS — F418 Other specified anxiety disorders: Secondary | ICD-10-CM

## 2014-09-24 DIAGNOSIS — D500 Iron deficiency anemia secondary to blood loss (chronic): Secondary | ICD-10-CM

## 2014-09-24 DIAGNOSIS — D509 Iron deficiency anemia, unspecified: Secondary | ICD-10-CM | POA: Diagnosis not present

## 2014-09-24 DIAGNOSIS — E039 Hypothyroidism, unspecified: Secondary | ICD-10-CM | POA: Diagnosis not present

## 2014-09-24 DIAGNOSIS — G2581 Restless legs syndrome: Secondary | ICD-10-CM | POA: Diagnosis not present

## 2014-09-24 DIAGNOSIS — N183 Chronic kidney disease, stage 3 unspecified: Secondary | ICD-10-CM

## 2014-09-24 DIAGNOSIS — E1122 Type 2 diabetes mellitus with diabetic chronic kidney disease: Secondary | ICD-10-CM

## 2014-09-24 DIAGNOSIS — I69854 Hemiplegia and hemiparesis following other cerebrovascular disease affecting left non-dominant side: Secondary | ICD-10-CM

## 2014-09-24 DIAGNOSIS — I6330 Cerebral infarction due to thrombosis of unspecified cerebral artery: Secondary | ICD-10-CM

## 2014-09-24 DIAGNOSIS — N390 Urinary tract infection, site not specified: Secondary | ICD-10-CM | POA: Diagnosis not present

## 2014-09-24 DIAGNOSIS — D631 Anemia in chronic kidney disease: Secondary | ICD-10-CM

## 2014-09-24 DIAGNOSIS — I69354 Hemiplegia and hemiparesis following cerebral infarction affecting left non-dominant side: Secondary | ICD-10-CM

## 2014-09-24 DIAGNOSIS — I639 Cerebral infarction, unspecified: Secondary | ICD-10-CM

## 2014-09-24 DIAGNOSIS — F0390 Unspecified dementia without behavioral disturbance: Secondary | ICD-10-CM

## 2014-09-24 DIAGNOSIS — E559 Vitamin D deficiency, unspecified: Secondary | ICD-10-CM | POA: Insufficient documentation

## 2014-09-24 DIAGNOSIS — I129 Hypertensive chronic kidney disease with stage 1 through stage 4 chronic kidney disease, or unspecified chronic kidney disease: Secondary | ICD-10-CM

## 2014-09-24 DIAGNOSIS — E162 Hypoglycemia, unspecified: Secondary | ICD-10-CM

## 2014-09-24 DIAGNOSIS — E034 Atrophy of thyroid (acquired): Secondary | ICD-10-CM

## 2014-09-24 DIAGNOSIS — N189 Chronic kidney disease, unspecified: Secondary | ICD-10-CM

## 2014-09-24 DIAGNOSIS — I10 Essential (primary) hypertension: Secondary | ICD-10-CM

## 2014-09-24 NOTE — Assessment & Plan Note (Signed)
Controlled on no meds

## 2014-09-24 NOTE — Assessment & Plan Note (Signed)
Reported pt recently received tx 2 units in hematology office?; SNF plan - check CBC in 5-7 days-; cont Vit B12 and iron

## 2014-09-24 NOTE — Assessment & Plan Note (Signed)
TSH 6 , not sure if on 75 mcg before or after that reading; SNF plan - cont synthroid 75 mcg, recheck TSH in 4 weeks

## 2014-09-24 NOTE — Progress Notes (Signed)
Patient ID: [name], female   DOB: 07/23/1930, 81 y.o.   MRN: [id number]   Facility: [facility]      Allergies  Allergen Reactions  . Aspirin Hives  . Penicillins Hives    Chief Complaint  Patient presents with  . Hospitalization Follow-up    HPI:  She has been hospitalized for hypoglycemia and uti. She is on omnicef for her uti. She is unable to fully participate in the hpi or ros; but states that she is feeling good today. She is here for short term rehab. More than likely this does represent a long term placement for her.    Past Medical History  Diagnosis Date  . Hemorrhoid   . Anemia   . Arthritis   . Stroke   . Diabetes mellitus without complication   . Hypertension   . Chronic kidney disease   . Thyroid disease     Past Surgical History  Procedure Laterality Date  . Breast lumpectomy      LEFT  . Abdominal hysterectomy      PARTIAL  . Mastectomy  05/2008    Left    VITAL SIGNS BP 168/80 mmHg  Pulse 68  Ht 5\' 5"  (1.651 m)  Wt 157 lb 9.6 oz (71.487 kg)  BMI 26.23 kg/m2  Patient's Medications  New Prescriptions   No medications on file  Previous Medications   ASCORBIC ACID (VITAMIN C) 1000 MG TABLET    Take 1,000 mg by mouth daily.   CALCIUM CARBONATE (CALCIUM 600) 600 MG TABS TABLET    Take 600 mg by mouth 2 (two) times daily with a meal.   CEFDINIR (OMNICEF) 300 MG CAPSULE    Take 1 capsule (300 mg total) by mouth 2 (two) times daily.   CHOLECALCIFEROL (VITAMIN D) 2000 UNITS TABLET    Take 2,000 Units by mouth daily.   CLOPIDOGREL (PLAVIX) 75 MG TABLET    Take 1 tablet (75 mg total) by mouth daily.   FERROUS SULFATE 325 (65 FE) MG TABLET    Take 325 mg by mouth 2 (two) times daily with a meal.   HYDROCODONE-ACETAMINOPHEN (NORCO) 10-325 MG PER TABLET    Take 1 tablet by mouth 2 (two) times daily.   INSULIN LISPRO (HUMALOG) 100 UNIT/ML INJECTION    Inject 5 Units into the skin 3 (three) times daily. Give 5 units for cbg >=150   LEVOTHYROXINE (SYNTHROID, LEVOTHROID) 112 MCG TABLET    Take 112 mcg by mouth daily before breakfast.   LORAZEPAM (ATIVAN) 0.5 MG TABLET    Take 1 tablet (0.5 mg total) by mouth every 8 (eight) hours as needed for anxiety.   PAROXETINE (PAXIL) 10 MG TABLET    Take 10 mg by mouth daily.   ROPINIROLE (REQUIP) 3 MG TABLET    Take 3 mg by mouth at bedtime.    SACCHAROMYCES BOULARDII (FLORASTOR) 250 MG CAPSULE    Take 1 capsule (250 mg total) by mouth 2 (two) times daily.   VITAMIN B-12 1000 MCG TABLET    Take 1 tablet (1,000 mcg total) by mouth daily.  Modified Medications   No medications on file  Discontinued Medications   No medications on file     SIGNIFICANT DIAGNOSTIC EXAMS  09-19-14: left knee x-ray: Severe knee osteoarthritis with lateral and posterior tibial translation.  09-19-14: bilateral hip and pelvis x-ray: 1. Mild degenerative changes within both hips. 2. No acute abnormality. 3. Degenerative changes in the lumbar spine. 4. Atherosclerosis.  LABS REVIEWED:   07-05-14: hgb a1c 5.4 07-07-14: chol 10; ldl 55; trig 89 ;hdl 37 09-16-14: wbc 7.9; hgb 7.1; hct 23.3; mcv 75.9; plt 338; glucose 90; bun 22; creat 0.94; k+3.2; na++135; liver normal albumin 2.3; urine culture: e-coli 09-17-14: wbc 7.4; hgb 7.7; hct 25.0; mcv 76.9; plt 319; glucose 09; bun ;23 creat 0.99; k+3.4; na++136; iron 13; tibc 113; tsh 6.520; vit b12: 1838; folate 10.8 09-19-14: glucose 115; bun 16; creat 0.79; k+4.4; na++136; free t4: 1.17; mag 1.4 09-20-14: wbc 6.2; hgb 9.1; hct 29.1; mcv 78.0; plt 319; glucose 108; bun 15; creat 0.82; k+4.1; na++134; liver normal albumin 2.3      Review of Systems  Unable to perform ROS: Other      Physical Exam  Constitutional: No distress.  Eyes: Conjunctivae are normal.  Neck: Neck supple. No JVD present. No thyromegaly present.  Cardiovascular: Normal rate, regular rhythm and intact distal pulses.   Respiratory: Effort normal and breath sounds normal. No  respiratory distress. She has no wheezes.  GI: Soft. Bowel sounds are normal. She exhibits no distension. There is no tenderness.  Musculoskeletal: She exhibits no edema.  Left hemiparesis    Lymphadenopathy:    She has no cervical adenopathy.  Neurological: She is alert.  Skin: Skin is warm and dry. She is not diaphoretic.  Has profon boots to both feet   Psychiatric: She has a normal mood and affect.     ASSESSMENT/ PLAN:  1. UTI: will have her complete her abt and will continue to monitor her stauts.   2. CVA: with left hemiparesis: is neurologically stable; will continue plavix 75 mg daily takes vicodin 10/325 mg twice daily for pain management and will monitor   3. Hypothyroidism: will continue synthroid 112 mcg daily and will monitor her free t4: 1.17; tsh 6.520  4. Anemia: is presently stable will continue iron twice daily; vit b12 1000 mcg daily  hgb 9.1; iron level is 13; will monitor  5. Diabetes: is stable will continue humalog 5 units with meals for cbg >=150 hgb is 5.4  6. Hypertension: her blood pressure is elevated will begin lisinopril 5 mg daily will have nursing check b/p every shift and will check bmp in 2 weeks.   7. Restless leg syndrome: will continue requip  3 mg nightly   8.  Depression with anxiety: will continue paxil 10 mg daily has ativan 0.5 mg three times daily as needed will monitor   9. ckd stage III:  Her bun/creat is 15/0.82; will monitor     Time spent with patient  50  minutes >50% time spent counseling; reviewing medical record; tests; labs; and developing future plan of care       Ok Edwards NP [facility]  Contact [phone] Monday through Friday 8am- 5pm  After hours call [phone]

## 2014-09-24 NOTE — Assessment & Plan Note (Signed)
Not stated but there in exam, mild - mod

## 2014-09-24 NOTE — Progress Notes (Addendum)
MRN: [id number] Name: [name]  Sex: female Age: 81 y.o. DOB: 1930-02-06  [address] #: [facility] Facility/Room:116 Level Of Care: SNF Provider: Inocencio Homes D Emergency Contacts: Extended Emergency Contact Information Primary Emergency Contact: Arrants,Eleanor Address: [place], [place] Phone: [phone] Relation: Daughter Secondary Emergency Contact: Claudette Head [place] Phone: [phone] Mobile Phone: [phone] Relation: Sister  Code Status:   Allergies: Aspirin and Penicillins  Chief Complaint  Patient presents with  . New Admit To SNF    HPI: Patient is 81 y.o. female with a history of CVA left-sided hemiparesis, hypertension, diabetes mellitus type 2, chronic kidney disease stage III who was brought to the ER for lethargy and confusion. The patient had not eaten much during the day due to lethargy and she was found to have a glucose of 50s.Pt was hospitalized from 8/31-9/1 for hypoglycemia. She is given IV dextrose and sugar improved however it dropped again. She was also noted to have of UA consistent with UTI. Notes in epic revealed that she was treated with a ten-day course of Macrobid which had been completed 3 days prior. Culture results revealed Escherichia coli UTI-and pt was tx with rocephin. Pt is d/c to SNF  Tf for generalized weakness for OT/PT. Pt's CBG will be followed closely and tx with short acting insulin.While at SNF pt will be followed for hypothyroidism with levolthyroxine, Vit D deficiency with Vit D supplement and for CVA with plavix as prophylaxis.  Past Medical History  Diagnosis Date  . Hemorrhoid   . Anemia   . Arthritis   . Stroke   . Diabetes mellitus without complication   . Hypertension   . Chronic kidney disease   . Thyroid disease     Past Surgical History  Procedure Laterality Date  . Breast lumpectomy      LEFT  . Abdominal hysterectomy       PARTIAL  . Mastectomy  05/2008    Left      Medication List       This list is accurate as of: 09/24/14  9:11 PM.  Always use your most recent med list.               CALCIUM 600 600 MG Tabs tablet  Generic drug:  calcium carbonate  Take 600 mg by mouth 2 (two) times daily with a meal.     cefdinir 300 MG capsule  Commonly known as:  OMNICEF  Take 1 capsule (300 mg total) by mouth 2 (two) times daily.     clopidogrel 75 MG tablet  Commonly known as:  PLAVIX  Take 1 tablet (75 mg total) by mouth daily.     cyanocobalamin 1000 MCG tablet  Take 1 tablet (1,000 mcg total) by mouth daily.     ferrous sulfate 325 (65 FE) MG tablet  Take 325 mg by mouth 2 (two) times daily with a meal.     HYDROcodone-acetaminophen 10-325 MG per tablet  Commonly known as:  NORCO  Take 1 tablet by mouth 2 (two) times daily.     insulin lispro 100 UNIT/ML injection  Commonly known as:  HUMALOG  Inject 5 Units into the skin 3 (three) times daily. Give 5 units for cbg >=150     levothyroxine 112 MCG tablet  Commonly known as:  SYNTHROID, LEVOTHROID  Take 112 mcg by mouth daily before breakfast.  LORazepam 0.5 MG tablet  Commonly known as:  ATIVAN  Take 1 tablet (0.5 mg total) by mouth every 8 (eight) hours as needed for anxiety.     PARoxetine 10 MG tablet  Commonly known as:  PAXIL  Take 10 mg by mouth daily.     rOPINIRole 3 MG tablet  Commonly known as:  REQUIP  Take 3 mg by mouth at bedtime.     saccharomyces boulardii 250 MG capsule  Commonly known as:  FLORASTOR  Take 1 capsule (250 mg total) by mouth 2 (two) times daily.     vitamin C 1000 MG tablet  Take 1,000 mg by mouth daily.     Vitamin D 2000 UNITS tablet  Take 2,000 Units by mouth daily.        No orders of the defined types were placed in this encounter.    Immunization History  Administered Date(s) Administered  . Influenza,inj,Quad PF,36+ Mos 10/07/2012, 10/13/2013  . Pneumococcal Conjugate-13  10/14/2010    Social History  Substance Use Topics  . Smoking status: Never Smoker   . Smokeless tobacco: Not on file  . Alcohol Use: No    Family history is  Unobtainable; not in record and pt can't say  Review of Systems  DATA OBTAINED: from patient, med record GENERAL:  Pt is hungry, can eat only liquids SKIN: No itching, rash  EYES: No eye pain, redness, discharge;pt says she can't see due to CVA EARS: No earache, tinnitus, change in hearing NOSE: No congestion, drainage or bleeding  MOUTH/THROAT: No mouth or tooth pain, No sore throat RESPIRATORY: No cough, wheezing, SOB CARDIAC: No chest pain, palpitations, lower extremity edema  GI: No abdominal pain, No N/V/D or constipation, No heartburn or reflux  GU: No dysuria, frequency or urgency, or incontinence  MUSCULOSKELETAL: No unrelieved bone/joint pain NEUROLOGIC: No headache, dizziness or focal weakness PSYCHIATRIC: concern about where she is  Filed Vitals:   09/24/14 1907  BP: 151/90  Pulse: 70  Temp: 98.4 F (36.9 C)  Resp: 17    SpO2 Readings from Last 1 Encounters:  09/20/14 100%        Physical Exam  GENERAL APPEARANCE: Alert, conversant, BF No acute physical distress but very anxious SKIN: No diaphoresis rash HEAD: Normocephalic, atraumatic  EYES: Conjunctiva/lids clear.  EOMs intact.  EARS: External exam WNL, canals clear. Hearing grossly normal.  NOSE: No deformity or discharge.  MOUTH/THROAT: Lips w/o lesions  RESPIRATORY: Breathing is even, unlabored. Lung sounds are clear   CARDIOVASCULAR: Heart RRR no murmurs, rubs or gallops. No peripheral edema.   GASTROINTESTINAL: Abdomen is soft, non-tender, not distended w/ normal bowel sounds. GENITOURINARY: Bladder non tender, not distended  MUSCULOSKELETAL: No abnormal joints or musculature NEUROLOGIC:  Cranial nerves 2-12 grossly intact; L side weakness PSYCHIATRIC: pt is perseverating about diet and stroke, also very anxious because she can't  tell where she is and doesn't know anyone  Patient Active Problem List   Diagnosis Date Noted  . Dementia without behavioral disturbance 09/24/2014  . Vitamin D deficiency 09/24/2014  . Pressure ulcer 09/17/2014  . Urinary tract infectious disease   . Hypoglycemia 09/16/2014  . Hypokalemia 09/16/2014  . Symptomatic anemia 08/03/2014  . Cerebral thrombosis with cerebral infarction 07/06/2014  . CVA (cerebral infarction) 07/06/2014  . Aphasia 07/05/2014  . DM2 (diabetes mellitus, type 2) 07/05/2014  . Constipation 03/29/2014  . CKD (chronic kidney disease) stage 3, GFR 30-59 ml/min 11/08/2013  . Anemia in CKD (chronic kidney disease) 11/07/2013  .  Breast cancer, left breast 10/07/2012  . Essential hypertension 08/29/2012  . HTN (hypertension) 08/25/2012  . Hypothyroidism 08/25/2012  . Anemia 01/09/2011    CBC    Component Value Date/Time   WBC 6.2 09/20/2014 0534   WBC 9.6 08/09/2014 1405   RBC 3.73* 09/20/2014 0534   RBC 3.25* 09/17/2014 0146   RBC 3.97 08/09/2014 1405   HGB 9.1* 09/20/2014 0534   HGB 9.8* 08/09/2014 1405   HCT 29.1* 09/20/2014 0534   HCT 30.6* 08/09/2014 1405   PLT 319 09/20/2014 0534   PLT 297 08/09/2014 1405   MCV 78.0 09/20/2014 0534   MCV 77.1* 08/09/2014 1405   LYMPHSABS 1.3 08/09/2014 1405   LYMPHSABS 1.0 08/03/2014 1810   MONOABS 0.5 08/09/2014 1405   MONOABS 0.8 08/03/2014 1810   EOSABS 0.1 08/09/2014 1405   EOSABS 0.1 08/03/2014 1810   BASOSABS 0.1 08/09/2014 1405   BASOSABS 0.0 08/03/2014 1810    CMP     Component Value Date/Time   NA 134* 09/20/2014 0534   NA 140 11/08/2013 1336   K 4.1 09/20/2014 0534   K 4.2 11/08/2013 1336   CL 97* 09/20/2014 0534   CL 106 04/15/2012 1332   CO2 30 09/20/2014 0534   CO2 27 11/08/2013 1336   GLUCOSE 108* 09/20/2014 0534   GLUCOSE 76 11/08/2013 1336   GLUCOSE 120* 04/15/2012 1332   BUN 15 09/20/2014 0534   BUN 39.1* 11/08/2013 1336   CREATININE 0.82 09/20/2014 0534   CREATININE 1.4*  11/08/2013 1336   CALCIUM 9.3 09/20/2014 0534   CALCIUM 9.2 11/08/2013 1336   PROT 6.1* 09/20/2014 0534   PROT 6.2* 11/08/2013 1336   ALBUMIN 2.3* 09/20/2014 0534   ALBUMIN 3.1* 11/08/2013 1336   AST 16 09/20/2014 0534   AST 11 11/08/2013 1336   ALT 7* 09/20/2014 0534   ALT 10 11/08/2013 1336   ALKPHOS 66 09/20/2014 0534   ALKPHOS 80 11/08/2013 1336   BILITOT 0.6 09/20/2014 0534   BILITOT 0.55 11/08/2013 1336   GFRNONAA >60 09/20/2014 0534   GFRAA >60 09/20/2014 0534    Lab Results  Component Value Date   HGBA1C 5.4 07/05/2014     No results found.  Not all labs, radiology exams or other studies done during hospitalization come through on my EPIC note; however they are reviewed by me.    Assessment and Plan  Hypoglycemia diabetes mellitus type 2 SECONDARY TO poor oral intake-cbg normal; SNF plan - check CBGs, continue patient 5 u with each meal and Sliding scale insulin  DM2 (diabetes mellitus, type 2) A1c in 6/16 was 5.4; SNF plan - pt likely needs very little insulin, maybe none, will monitor;due to age and debility tight control is not desirable even with hx CVA  Cerebral thrombosis with cerebral infarction With L side weakness, stable ; SNF [plan - pt's LDL 55 without meds , on very little insulin A1c 5.4; cont plavix as prophylaxis  HTN (hypertension) Controlled on no meds  Hypothyroidism TSH 6 , not sure if on 75 mcg before or after that reading; SNF plan - cont synthroid 75 mcg, recheck TSH in 4 weeks  Urinary tract infectious disease Ecoli sens to macrobid but prob felt untx UTI partially cause of hypoglycemia so tx with rocephin in hospital ; SNF plan - cefdinir BID for 7 days  CKD (chronic kidney disease) stage 3, GFR 30-59 ml/min SNF plan - encourage po intake , follow up BMP  Anemia in CKD (chronic kidney disease) Reported  pt recently received tx 2 units in hematology office?; SNF plan - check CBC in 5-7 days-; cont Vit B12 and iron  Dementia  without behavioral disturbance Not stated but there in exam, mild - mod  Vitamin D deficiency SNF plan - cont Vit D 2000u daily   Pt seen 09/20/2014 Time spent > 45 min  > 50% of time with patient was spent reviewing records, labs, tests and studies, counseling and developing plan of care  Hennie Duos, MD

## 2014-09-24 NOTE — Assessment & Plan Note (Addendum)
diabetes mellitus type 2 SECONDARY TO poor oral intake-cbg normal; SNF plan - check CBGs, continue patient 5 u with each meal and Sliding scale insulin

## 2014-09-24 NOTE — Assessment & Plan Note (Signed)
A1c in 6/16 was 5.4; SNF plan - pt likely needs very little insulin, maybe none, will monitor;due to age and debility tight control is not desirable even with hx CVA

## 2014-09-24 NOTE — Assessment & Plan Note (Signed)
With L side weakness, stable ; SNF [plan - pt's LDL 55 without meds , on very little insulin A1c 5.4; cont plavix as prophylaxis

## 2014-09-24 NOTE — Assessment & Plan Note (Signed)
Ecoli sens to macrobid but prob felt untx UTI partially cause of hypoglycemia so tx with rocephin in hospital ; SNF plan - cefdinir BID for 7 days

## 2014-09-24 NOTE — Addendum Note (Signed)
Addended by: Inocencio Homes D on: 09/24/2014 09:24 PM   Modules accepted: Level of Service

## 2014-09-24 NOTE — Assessment & Plan Note (Signed)
SNF plan - cont Vit D 2000u daily

## 2014-09-24 NOTE — Assessment & Plan Note (Signed)
SNF plan - encourage po intake , follow up BMP

## 2014-09-25 ENCOUNTER — Encounter: Payer: Self-pay | Admitting: Internal Medicine

## 2014-09-25 ENCOUNTER — Non-Acute Institutional Stay (SKILLED_NURSING_FACILITY): Payer: Medicare Other | Admitting: Internal Medicine

## 2014-09-25 DIAGNOSIS — E1122 Type 2 diabetes mellitus with diabetic chronic kidney disease: Secondary | ICD-10-CM | POA: Diagnosis not present

## 2014-09-25 DIAGNOSIS — N189 Chronic kidney disease, unspecified: Secondary | ICD-10-CM | POA: Diagnosis not present

## 2014-09-25 DIAGNOSIS — D631 Anemia in chronic kidney disease: Secondary | ICD-10-CM | POA: Diagnosis not present

## 2014-09-25 DIAGNOSIS — K121 Other forms of stomatitis: Secondary | ICD-10-CM

## 2014-09-25 DIAGNOSIS — I69354 Hemiplegia and hemiparesis following cerebral infarction affecting left non-dominant side: Secondary | ICD-10-CM

## 2014-09-25 DIAGNOSIS — I69854 Hemiplegia and hemiparesis following other cerebrovascular disease affecting left non-dominant side: Secondary | ICD-10-CM | POA: Diagnosis not present

## 2014-09-25 DIAGNOSIS — E039 Hypothyroidism, unspecified: Secondary | ICD-10-CM

## 2014-09-25 DIAGNOSIS — N183 Chronic kidney disease, stage 3 unspecified: Secondary | ICD-10-CM

## 2014-09-25 DIAGNOSIS — I10 Essential (primary) hypertension: Secondary | ICD-10-CM

## 2014-09-25 DIAGNOSIS — F418 Other specified anxiety disorders: Secondary | ICD-10-CM

## 2014-09-25 DIAGNOSIS — F0390 Unspecified dementia without behavioral disturbance: Secondary | ICD-10-CM

## 2014-09-25 NOTE — Progress Notes (Signed)
This encounter was created in error - please disregard.

## 2014-09-25 NOTE — Assessment & Plan Note (Signed)
L side weakness, stable; SNF plan -LDL 55 on no meds, A1c 5.4 on very little insulin;cont plavix as prophylaxis

## 2014-09-25 NOTE — Progress Notes (Signed)
MRN: [id number] Name: Aracelys Glade  Sex: female Age: 81 y.o. DOB: Aug 28, 1930  [place] #: [facility] Facility/Room:116 Level Of Care: SNF Provider: Inocencio Homes D Emergency Contacts: Extended Emergency Contact Information Primary Emergency Contact: Maietta,Eleanor Address: [place], [place] Phone: [phone] Relation: Daughter Secondary Emergency Contact: Claudette Head States of [place] Phone: [phone] Mobile Phone: [phone] Relation: Sister  Code Status:   Allergies: Aspirin and Penicillins  Chief Complaint  Patient presents with  . New Admit To SNF    HPI: Patient is 81 y.o. female with a history of CVA left-sided hemiparesis, hypertension, diabetes mellitus type 2, chronic kidney disease stage III who was brought to the ER for lethargy and confusion. The patient had not eaten much during the day due to lethargy and she was found to have a glucose of 50s.Pt was hospitalized from 8/31-9/1 for hypoglycemia. She is given IV dextrose and sugar improved however it dropped again. She was also noted to have of UA consistent with UTI. Notes in epic revealed that she was treated with a ten-day course of Macrobid which had been completed 3 days prior. Culture results revealed Escherichia coli UTI-and pt was tx with rocephin. Pt is d/c to SNF Tf for generalized weakness for OT/PT. Pt's CBG will be followed closely and tx with short acting insulin.While at SNF pt will be followed for hypothyroidism with levolthyroxine, Vit D deficiency with Vit D supplement and for CVA with plavix as prophylaxis.  Past Medical History  Diagnosis Date  . Hemorrhoid   . Anemia   . Arthritis   . Stroke   . Diabetes mellitus without complication   . Hypertension   . Chronic kidney disease   . Thyroid disease     Past Surgical History  Procedure Laterality Date  . Breast lumpectomy      LEFT  . Abdominal hysterectomy       PARTIAL  . Mastectomy  05/2008    Left      Medication List    Notice    This visit is on the same day as an admission, and a visit start time could not be determined. If the visit took place after discharge, manually review the med list with the patient.     Current Outpatient Prescriptions on File Prior to Visit  Medication Sig Dispense Refill  . Ascorbic Acid (VITAMIN C) 1000 MG tablet Take 1,000 mg by mouth daily.    . calcium carbonate (CALCIUM 600) 600 MG TABS tablet Take 600 mg by mouth 2 (two) times daily with a meal.    . cefdinir (OMNICEF) 300 MG capsule Take 1 capsule (300 mg total) by mouth 2 (two) times daily. 12 capsule 0  . Cholecalciferol (VITAMIN D) 2000 UNITS tablet Take 2,000 Units by mouth daily.    . clopidogrel (PLAVIX) 75 MG tablet Take 1 tablet (75 mg total) by mouth daily.    . ferrous sulfate 325 (65 FE) MG tablet Take 325 mg by mouth 2 (two) times daily with a meal.    . HYDROcodone-acetaminophen (NORCO) 10-325 MG per tablet Take 1 tablet by mouth 2 (two) times daily. 30 tablet 0  . insulin lispro (HUMALOG) 100 UNIT/ML injection Inject 5 Units into the skin 3 (three) times daily. Give 5 units for cbg >=150    . levothyroxine (SYNTHROID, LEVOTHROID) 112 MCG tablet Take 112 mcg by mouth daily before breakfast.    .  LORazepam (ATIVAN) 0.5 MG tablet Take 1 tablet (0.5 mg total) by mouth every 8 (eight) hours as needed for anxiety. 30 tablet 0  . PARoxetine (PAXIL) 10 MG tablet Take 10 mg by mouth daily.    Marland Kitchen rOPINIRole (REQUIP) 3 MG tablet Take 3 mg by mouth at bedtime.     . saccharomyces boulardii (FLORASTOR) 250 MG capsule Take 1 capsule (250 mg total) by mouth 2 (two) times daily. 30 capsule 0  . vitamin B-12 1000 MCG tablet Take 1 tablet (1,000 mcg total) by mouth daily.     No current facility-administered medications on file prior to visit.     No orders of the defined types were placed in this encounter.    Immunization History  Administered  Date(s) Administered  . Influenza,inj,Quad PF,36+ Mos 10/07/2012, 10/13/2013  . Pneumococcal Conjugate-13 10/14/2010    Social History  Substance Use Topics  . Smoking status: Never Smoker   . Smokeless tobacco: Not on file  . Alcohol Use: No    Family history is unobtainable ;not in EPIC, pt too confused to say    Review of Systems  DATA OBTAINED: from patient medical rec GENERAL:  Pt is hungry SKIN: No itching, rash or wounds EYES: No eye pain, redness, discharge; can't see 2/2 CVA EARS: No earache, tinnitus, change in hearing NOSE: No congestion, drainage or bleeding  MOUTH/THROAT: No mouth or tooth pain, No sore throat RESPIRATORY: No cough, wheezing, SOB CARDIAC: No chest pain, palpitations, lower extremity edema  GI: No abdominal pain, No N/V/D or constipation, No heartburn or reflux  GU: No dysuria, frequency or urgency, or incontinence  MUSCULOSKELETAL: No unrelieved bone/joint pain NEUROLOGIC: No headache, dizziness ornew  focal weakness PSYCHIATRIC: + anxiety    Filed Vitals:   09/25/14 1130  BP: 151/90  Pulse: 70  Temp: 98.4 F (36.9 C)  Resp: 17    SpO2 Readings from Last 1 Encounters:  09/20/14 100%        Physical Exam  GENERAL APPEARANCE: Alert, conversant,  BFNo acute physical distress  But very anxious  SKIN: No diaphoresis rash HEAD: Normocephalic, atraumatic  EYES: Conjunctiva/lids clear. Pupils round, reactive. EOMs intact.  EARS: External exam WNL, canals clear. Hearing grossly normal.  NOSE: No deformity or discharge.  MOUTH/THROAT: Lips w/o lesions  RESPIRATORY: Breathing is even, unlabored. Lung sounds are clear   CARDIOVASCULAR: Heart RRR no murmurs, rubs or gallops. No peripheral edema.   GASTROINTESTINAL: Abdomen is soft, non-tender, not distended w/ normal bowel sounds. GENITOURINARY: Bladder non tender, not distended  MUSCULOSKELETAL: No abnormal joints or musculature NEUROLOGIC:  Cranial nerves 2-12 grossly intact; L side  weakness  PSYCHIATRIC: very anxious , perseverates about stroke,concern about knowing where she is, no behavioral issues  Patient Active Problem List   Diagnosis Date Noted  . Dementia without behavioral disturbance 09/24/2014  . Vitamin D deficiency 09/24/2014  . Pressure ulcer 09/17/2014  . Urinary tract infectious disease   . Hypoglycemia 09/16/2014  . Hypokalemia 09/16/2014  . Symptomatic anemia 08/03/2014  . Cerebral thrombosis with cerebral infarction 07/06/2014  . CVA (cerebral infarction) 07/06/2014  . Aphasia 07/05/2014  . DM2 (diabetes mellitus, type 2) 07/05/2014  . Constipation 03/29/2014  . CKD (chronic kidney disease) stage 3, GFR 30-59 ml/min 11/08/2013  . Anemia in CKD (chronic kidney disease) 11/07/2013  . Breast cancer, left breast 10/07/2012  . Essential hypertension 08/29/2012  . HTN (hypertension) 08/25/2012  . Hypothyroidism 08/25/2012  . Anemia 01/09/2011    CBC  Component Value Date/Time   WBC 6.2 09/20/2014 0534   WBC 9.6 08/09/2014 1405   RBC 3.73* 09/20/2014 0534   RBC 3.25* 09/17/2014 0146   RBC 3.97 08/09/2014 1405   HGB 9.1* 09/20/2014 0534   HGB 9.8* 08/09/2014 1405   HCT 29.1* 09/20/2014 0534   HCT 30.6* 08/09/2014 1405   PLT 319 09/20/2014 0534   PLT 297 08/09/2014 1405   MCV 78.0 09/20/2014 0534   MCV 77.1* 08/09/2014 1405   LYMPHSABS 1.3 08/09/2014 1405   LYMPHSABS 1.0 08/03/2014 1810   MONOABS 0.5 08/09/2014 1405   MONOABS 0.8 08/03/2014 1810   EOSABS 0.1 08/09/2014 1405   EOSABS 0.1 08/03/2014 1810   BASOSABS 0.1 08/09/2014 1405   BASOSABS 0.0 08/03/2014 1810    CMP     Component Value Date/Time   NA 134* 09/20/2014 0534   NA 140 11/08/2013 1336   K 4.1 09/20/2014 0534   K 4.2 11/08/2013 1336   CL 97* 09/20/2014 0534   CL 106 04/15/2012 1332   CO2 30 09/20/2014 0534   CO2 27 11/08/2013 1336   GLUCOSE 108* 09/20/2014 0534   GLUCOSE 76 11/08/2013 1336   GLUCOSE 120* 04/15/2012 1332   BUN 15 09/20/2014 0534   BUN  39.1* 11/08/2013 1336   CREATININE 0.82 09/20/2014 0534   CREATININE 1.4* 11/08/2013 1336   CALCIUM 9.3 09/20/2014 0534   CALCIUM 9.2 11/08/2013 1336   PROT 6.1* 09/20/2014 0534   PROT 6.2* 11/08/2013 1336   ALBUMIN 2.3* 09/20/2014 0534   ALBUMIN 3.1* 11/08/2013 1336   AST 16 09/20/2014 0534   AST 11 11/08/2013 1336   ALT 7* 09/20/2014 0534   ALT 10 11/08/2013 1336   ALKPHOS 66 09/20/2014 0534   ALKPHOS 80 11/08/2013 1336   BILITOT 0.6 09/20/2014 0534   BILITOT 0.55 11/08/2013 1336   GFRNONAA >60 09/20/2014 0534   GFRAA >60 09/20/2014 0534    Lab Results  Component Value Date   HGBA1C 5.4 07/05/2014     No results found.  Not all labs, radiology exams or other studies done during hospitalization come through on my EPIC note; however they are reviewed by me.    Assessment and Plan  Hypoglycemia 2/2 poor po intake, CBG normal before d/c SNF plan - cbgs ,cont 5 u with each meal, no long acting  DM2 (diabetes mellitus, type 2) A1c in 06/2014 was 5.4 SNF plan -needs very little insulin ,prob none, will monitor;due to age and disability tight control is not desireable even with hx CVA  Cerebral thrombosis with cerebral infarction L side weakness, stable; SNF plan -LDL 55 on no meds, A1c 5.4 on very little insulin;cont plavix as prophylaxis  HTN (hypertension) Controlled on no meds  Hypothyroidism TSH 6, not sure if synthroid 112 mc was started before or after the level; SNF plan - cont 112 mg synthroid daily  Dementia without behavioral disturbance Not stated in hx but present during exam  Urinary tract infectious disease E Coli grew out abd sens to macrobid so... Pt treated with rocephin SNF plan - cefdinir 300 mg BID for 7 days  CKD (chronic kidney disease) stage 3, GFR 30-59 ml/min SNF - encourage po intake, f/u BMP  Anemia in CKD (chronic kidney disease) Reported pt received 2 u in hematology office? SNF - check CBC 3-5 days, cont iron and Vit B12  Vitamin D  deficiency SNF- cont Vit D 2000 u daily   Time spent > 45 min ; > 50% of  time with patient was spent reviewing records, labs, tests and studies, counseling and developing plan of care  Hennie Duos, MD

## 2014-09-25 NOTE — Assessment & Plan Note (Signed)
2/2 poor po intake, CBG normal before d/c SNF plan - cbgs ,cont 5 u with each meal, no long acting

## 2014-09-25 NOTE — Assessment & Plan Note (Signed)
Not stated in hx but present during exam

## 2014-09-25 NOTE — Assessment & Plan Note (Signed)
Reported pt received 2 u in hematology office? SNF - check CBC 3-5 days, cont iron and Vit B12

## 2014-09-25 NOTE — Assessment & Plan Note (Signed)
A1c in 06/2014 was 5.4 SNF plan -needs very little insulin ,prob none, will monitor;due to age and disability tight control is not desireable even with hx CVA

## 2014-09-25 NOTE — Addendum Note (Signed)
Addended by: Inocencio Homes D on: 09/25/2014 11:55 AM   Modules accepted: Level of Service, SmartSet

## 2014-09-25 NOTE — Assessment & Plan Note (Signed)
TSH 6, not sure if synthroid 112 mc was started before or after the level; SNF plan - cont 112 mg synthroid daily

## 2014-09-25 NOTE — Assessment & Plan Note (Signed)
SNF - encourage po intake, f/u BMP

## 2014-09-25 NOTE — Assessment & Plan Note (Signed)
SNF- cont Vit D 2000 u daily

## 2014-09-25 NOTE — Assessment & Plan Note (Signed)
Controlled on no meds

## 2014-09-25 NOTE — Assessment & Plan Note (Signed)
E Coli grew out abd sens to macrobid so... Pt treated with rocephin SNF plan - cefdinir 300 mg BID for 7 days

## 2014-09-27 ENCOUNTER — Other Ambulatory Visit: Payer: Self-pay | Admitting: Oncology

## 2014-09-27 ENCOUNTER — Other Ambulatory Visit: Payer: Medicare Other

## 2014-09-27 ENCOUNTER — Telehealth: Payer: Self-pay | Admitting: Nurse Practitioner

## 2014-09-27 ENCOUNTER — Ambulatory Visit: Payer: Medicare Other | Admitting: Oncology

## 2014-09-27 NOTE — Telephone Encounter (Signed)
-----   Message from Gordy Levan, MD sent at 09/27/2014  2:00 PM EDT ----- [place] 09-27-14, but recently in hospital and DC to SNF.  Please try to speak with daughter. Let her know we are glad to reschedule at this office when patient is able, or I am also glad just to listen out if other doctors caring for her need our help  thanks

## 2014-09-27 NOTE — Telephone Encounter (Signed)
Rn called patient's daughter per MD message below. She is requesting to reschedule her mother's appointment to next week/next available.  She confirms if Ms. Pember is still in SNF, transportation will be arranged to bring her here. She knows to contact our office with any needs in the meantime. Will inform Dr. Marko Plume and scheduling.

## 2014-09-28 ENCOUNTER — Encounter: Payer: Self-pay | Admitting: Internal Medicine

## 2014-10-01 NOTE — Progress Notes (Signed)
Patient ID: [name], female   DOB: 1930/02/02, 81 y.o.   MRN: [id number]    HISTORY AND PHYSICAL   DATE: 09/25/14  Location:  [facility]    Place of Service: SNF (31)   Extended Emergency Contact Information Primary Emergency Contact: Hornsby,Eleanor Address: [address], [place] Phone: [phone] Relation: Daughter Secondary Emergency Contact: Claudette [place] of [place] Phone: [phone] Mobile Phone: [phone] Relation: Sister  Advanced Directive information  FULL CODE  Chief Complaint  Patient presents with  . New Admit To SNF    HPI:  81 yo female seen today as a new admission into SNF from [facility]. She was d/'d from hospital recently with hyoglycemia and UTI. She was tx with omnicef. She is a poor historian due to dementia. Hx obtained form chart  DM - last A1c 5.4%. CBGs 80-140s, occasionally 190-200s. No low BS reactions. Cont humalog qAC per scale.  AOCD - followed by hematology. She rec'd 2 units PRBCs at most recent hospital stay. She takes iron supplement and B12  Hx CVA with left hemiparesis - stable on plavix  HTN - indapamide stopped in hospital. SBP 150s currently. Cont lisinopril  CKD - stable  Chronic pain/arthritis - pain controlled on norco. She takes requip for RLS  Hypothyroid - stable on levothyroxine  Depression/anxiety - mood stable on paxil and lorazepam  She takes vitamin/mineral supplements  Past Medical History  Diagnosis Date  . Hemorrhoid   . Anemia   . Arthritis   . Stroke   . Diabetes mellitus without complication   . Hypertension   . Chronic kidney disease   . Thyroid disease     Past Surgical History  Procedure Laterality Date  . Breast lumpectomy      LEFT  . Abdominal hysterectomy      PARTIAL  . Mastectomy  05/2008    Left    Patient Care Team: Antony Blackbird, MD as PCP - General (Family  Medicine) Doyce Loose, LCSW as Social Worker  Social History   Social History  . Marital Status: Single    Spouse Name: N/A  . Number of Children: N/A  . Years of Education: N/A   Occupational History  . Not on file.   Social History Main Topics  . Smoking status: Never Smoker   . Smokeless tobacco: Not on file  . Alcohol Use: No  . Drug Use: No  . Sexual Activity: Not on file   Other Topics Concern  . Not on file   Social History Narrative     reports that she has never smoked. She does not have any smokeless tobacco history on file. She reports that she does not drink alcohol or use illicit drugs.  No family history on file. Family Status  Relation Status Death Age  . Mother Deceased   . Father Deceased   . Sister Alive   . Brother Deceased   . Sister Deceased     Immunization History  Administered Date(s) Administered  . Influenza,inj,Quad PF,36+ Mos 10/07/2012, 10/13/2013  . Pneumococcal Conjugate-13 10/14/2010    Allergies  Allergen Reactions  . Aspirin Hives  . Penicillins Hives    Medications: Patient's Medications  New Prescriptions   No medications on file  Previous Medications   ASCORBIC ACID (VITAMIN C) 1000 MG TABLET    Take 1,000 mg by mouth daily.  CALCIUM CARBONATE (CALCIUM 600) 600 MG TABS TABLET    Take 600 mg by mouth 2 (two) times daily with a meal.   CEFDINIR (OMNICEF) 300 MG CAPSULE    Take 1 capsule (300 mg total) by mouth 2 (two) times daily.   CHOLECALCIFEROL (VITAMIN D) 2000 UNITS TABLET    Take 2,000 Units by mouth daily.   CLOPIDOGREL (PLAVIX) 75 MG TABLET    Take 1 tablet (75 mg total) by mouth daily.   FERROUS SULFATE 325 (65 FE) MG TABLET    Take 325 mg by mouth 2 (two) times daily with a meal.   HYDROCODONE-ACETAMINOPHEN (NORCO) 10-325 MG PER TABLET    Take 1 tablet by mouth 2 (two) times daily.   INSULIN LISPRO (HUMALOG) 100 UNIT/ML INJECTION    Inject 5 Units into the skin 3 (three) times daily. Give 5 units for cbg  >=150   LEVOTHYROXINE (SYNTHROID, LEVOTHROID) 112 MCG TABLET    Take 112 mcg by mouth daily before breakfast.   LORAZEPAM (ATIVAN) 0.5 MG TABLET    Take 1 tablet (0.5 mg total) by mouth every 8 (eight) hours as needed for anxiety.   PAROXETINE (PAXIL) 10 MG TABLET    Take 10 mg by mouth daily.   ROPINIROLE (REQUIP) 3 MG TABLET    Take 3 mg by mouth at bedtime.    VITAMIN B-12 1000 MCG TABLET    Take 1 tablet (1,000 mcg total) by mouth daily.  Modified Medications   No medications on file  Discontinued Medications   No medications on file    Review of Systems  Unable to perform ROS: Dementia    Filed Vitals:   09/25/14 0934  BP: 152/77  Pulse: 78  Temp: 98.1 F (36.7 C)  Weight: 163 lb (73.936 kg)   Body mass index is 27.12 kg/(m^2).  Physical Exam  Constitutional: She appears well-developed and well-nourished. No distress.  Lying in bed in NAD   HENT:  Mouth/Throat: Oropharynx is clear and moist. No oropharyngeal exudate.    Eyes: Pupils are equal, round, and reactive to light. No scleral icterus.  Neck: Neck supple. Carotid bruit is not present. No tracheal deviation present. No thyromegaly present.  Cardiovascular: Normal rate, regular rhythm, normal heart sounds and intact distal pulses.  Exam reveals no gallop and no friction rub.   No murmur heard. No LE edema b/l. no calf TTP.   Pulmonary/Chest: Effort normal and breath sounds normal. No stridor. No respiratory distress. She has no wheezes. She has no rales.  Abdominal: Soft. Bowel sounds are normal. She exhibits no distension and no mass. There is no hepatomegaly. There is no tenderness. There is no rebound and no guarding.  Lymphadenopathy:    She has no cervical adenopathy.  Neurological: She is alert. She has normal reflexes.  Skin: Skin is warm and dry. No rash noted.  prevalon boot intact b/l  Psychiatric: She has a normal mood and affect. Her behavior is normal.     Labs reviewed: Admission on  09/16/2014, Discharged on 09/20/2014  No results displayed because visit has over 200 results.  CBC Latest Ref Rng 09/20/2014 09/18/2014 09/17/2014  WBC 4.0 - 10.5 K/uL 6.2 6.8 -  Hemoglobin 12.0 - 15.0 g/dL 9.1(L) 8.7(L) 9.7(L)  Hematocrit 36.0 - 46.0 % 29.1(L) 28.0(L) 30.5(L)  Platelets 150 - 400 K/uL 319 300 -    CMP Latest Ref Rng 09/20/2014 09/19/2014 09/18/2014  Glucose 65 - 99 mg/dL 108(H) 115(H) 104(H)  BUN 6 -  20 mg/dL 15 16 20   Creatinine 0.44 - 1.00 mg/dL 0.82 0.79 0.81  Sodium 135 - 145 mmol/L 134(L) 136 136  Potassium 3.5 - 5.1 mmol/L 4.1 4.4 3.3(L)  Chloride 101 - 111 mmol/L 97(L) 98(L) 97(L)  CO2 22 - 32 mmol/L 30 30 30   Calcium 8.9 - 10.3 mg/dL 9.3 9.2 8.9  Total Protein 6.5 - 8.1 g/dL 6.1(L) - -  Total Bilirubin 0.3 - 1.2 mg/dL 0.6 - -  Alkaline Phos 38 - 126 U/L 66 - -  AST 15 - 41 U/L 16 - -  ALT 14 - 54 U/L 7(L) - -      Abstract on 09/07/2014  Component Date Value Ref Range Status  . TSH 08/30/2014 7.44* 0.41 - 5.90 uIU/mL Final  Appointment on 08/09/2014  Component Date Value Ref Range Status  . WBC 08/09/2014 9.6  3.9 - 10.3 10e3/uL Final  . NEUT# 08/09/2014 7.6* 1.5 - 6.5 10e3/uL Final  . HGB 08/09/2014 9.8* 11.6 - 15.9 g/dL Final  . HCT 08/09/2014 30.6* 34.8 - 46.6 % Final  . Platelets 08/09/2014 297  145 - 400 10e3/uL Final  . MCV 08/09/2014 77.1* 79.5 - 101.0 fL Final  . MCH 08/09/2014 24.8* 25.1 - 34.0 pg Final  . MCHC 08/09/2014 32.1  31.5 - 36.0 g/dL Final  . RBC 08/09/2014 3.97  3.70 - 5.45 10e6/uL Final  . RDW 08/09/2014 15.2* 11.2 - 14.5 % Final  . lymph# 08/09/2014 1.3  0.9 - 3.3 10e3/uL Final  . MONO# 08/09/2014 0.5  0.1 - 0.9 10e3/uL Final  . Eosinophils Absolute 08/09/2014 0.1  0.0 - 0.5 10e3/uL Final  . Basophils Absolute 08/09/2014 0.1  0.0 - 0.1 10e3/uL Final  . NEUT% 08/09/2014 79.4* 38.4 - 76.8 % Final  . LYMPH% 08/09/2014 13.2* 14.0 - 49.7 % Final  . MONO% 08/09/2014 5.5  0.0 - 14.0 % Final  . EOS% 08/09/2014 1.3  0.0 - 7.0 % Final    . BASO% 08/09/2014 0.6  0.0 - 2.0 % Final  Admission on 08/03/2014, Discharged on 08/05/2014  Component Date Value Ref Range Status  . Sodium 08/03/2014 137  135 - 145 mmol/L Final  . Potassium 08/03/2014 3.5  3.5 - 5.1 mmol/L Final  . Chloride 08/03/2014 99* 101 - 111 mmol/L Final  . CO2 08/03/2014 30  22 - 32 mmol/L Final  . Glucose, Bld 08/03/2014 164* 65 - 99 mg/dL Final  . BUN 08/03/2014 38* 6 - 20 mg/dL Final  . Creatinine, Ser 08/03/2014 1.35* 0.44 - 1.00 mg/dL Final  . Calcium 08/03/2014 9.4  8.9 - 10.3 mg/dL Final  . GFR calc non Af Amer 08/03/2014 36* >60 mL/min Final  . GFR calc Af Amer 08/03/2014 41* >60 mL/min Final   Comment: (NOTE) The eGFR has been calculated using the CKD EPI equation. This calculation has not been validated in all clinical situations. eGFR's persistently <60 mL/min signify possible Chronic Kidney Disease.   . Anion gap 08/03/2014 8  5 - 15 Final  . WBC 08/03/2014 10.0  4.0 - 10.5 K/uL Final  . RBC 08/03/2014 3.17* 3.87 - 5.11 MIL/uL Final  . Hemoglobin 08/03/2014 7.6* 12.0 - 15.0 g/dL Final  . HCT 08/03/2014 23.6* 36.0 - 46.0 % Final  . MCV 08/03/2014 74.4* 78.0 - 100.0 fL Final  . MCH 08/03/2014 24.0* 26.0 - 34.0 pg Final  . MCHC 08/03/2014 32.2  30.0 - 36.0 g/dL Final  . RDW 08/03/2014 14.4  11.5 - 15.5 % Final  .  Platelets 08/03/2014 266  150 - 400 K/uL Final  . Neutrophils Relative % 08/03/2014 81* 43 - 77 % Final  . Lymphocytes Relative 08/03/2014 10* 12 - 46 % Final  . Monocytes Relative 08/03/2014 8  3 - 12 % Final  . Eosinophils Relative 08/03/2014 1  0 - 5 % Final  . Basophils Relative 08/03/2014 0  0 - 1 % Final  . Neutro Abs 08/03/2014 8.1* 1.7 - 7.7 K/uL Final  . Lymphs Abs 08/03/2014 1.0  0.7 - 4.0 K/uL Final  . Monocytes Absolute 08/03/2014 0.8  0.1 - 1.0 K/uL Final  . Eosinophils Absolute 08/03/2014 0.1  0.0 - 0.7 K/uL Final  . Basophils Absolute 08/03/2014 0.0  0.0 - 0.1 K/uL Final  . RBC Morphology 08/03/2014 POLYCHROMASIA  PRESENT   Final  . Smear Review 08/03/2014 LARGE PLATELETS PRESENT   Final  . Sodium 08/03/2014 136  135 - 145 mmol/L Final  . Potassium 08/03/2014 3.4* 3.5 - 5.1 mmol/L Final  . Chloride 08/03/2014 96* 101 - 111 mmol/L Final  . BUN 08/03/2014 36* 6 - 20 mg/dL Final  . Creatinine, Ser 08/03/2014 1.30* 0.44 - 1.00 mg/dL Final  . Glucose, Bld 08/03/2014 162* 65 - 99 mg/dL Final  . Calcium, Ion 08/03/2014 1.26  1.13 - 1.30 mmol/L Final  . TCO2 08/03/2014 27  0 - 100 mmol/L Final  . Hemoglobin 08/03/2014 8.5* 12.0 - 15.0 g/dL Final  . HCT 08/03/2014 25.0* 36.0 - 46.0 % Final  . ABO/RH(D) 08/03/2014 O NEG   Final  . Antibody Screen 08/03/2014 NEG   Final  . Sample Expiration 08/03/2014 08/06/2014   Final  . Unit Number 08/03/2014 [id number]   Final  . Blood Component Type 08/03/2014 RED CELLS,LR   Final  . Unit division 08/03/2014 00   Final  . Status of Unit 08/03/2014 ISSUED,FINAL   Final  . Transfusion Status 08/03/2014 OK TO TRANSFUSE   Final  . Crossmatch Result 08/03/2014 Compatible   Final  . Unit Number 08/03/2014 [id number]   Final  . Blood Component Type 08/03/2014 RED CELLS,LR   Final  . Unit division 08/03/2014 00   Final  . Status of Unit 08/03/2014 ISSUED,FINAL   Final  . Transfusion Status 08/03/2014 OK TO TRANSFUSE   Final  . Crossmatch Result 08/03/2014 Compatible   Final  . ABO/RH(D) 08/03/2014 O NEG   Final  . Order Confirmation 08/03/2014 ORDER PROCESSED BY BLOOD BANK   Final  . Fecal Occult Bld 08/03/2014 NEGATIVE  NEGATIVE Final  . Vitamin B-12 08/03/2014 1417* 180 - 914 pg/mL Final   Comment: (NOTE) This assay is not validated for testing neonatal or myeloproliferative syndrome specimens for Vitamin B12 levels.   . Folate 08/03/2014 9.0  >5.9 ng/mL Final  . Iron 08/03/2014 17* 28 - 170 ug/dL Final  . TIBC 08/03/2014 126* 250 - 450 ug/dL Final  . Saturation Ratios 08/03/2014 13  10.4 - 31.8 % Final  . UIBC 08/03/2014 109   Final  . Ferritin 08/03/2014  1396* 11 - 307 ng/mL Final  . Retic Ct Pct 08/03/2014 1.6  0.4 - 3.1 % Final  . RBC. 08/03/2014 3.18* 3.87 - 5.11 MIL/uL Final  . Retic Count, Manual 08/03/2014 50.9  19.0 - 186.0 K/uL Final  . WBC 08/03/2014 9.8  4.0 - 10.5 K/uL Final  . RBC 08/03/2014 3.06* 3.87 - 5.11 MIL/uL Final  . Hemoglobin 08/03/2014 7.4* 12.0 - 15.0 g/dL Final  . HCT 08/03/2014 23.0* 36.0 - 46.0 %  Final  . MCV 08/03/2014 75.2* 78.0 - 100.0 fL Final  . MCH 08/03/2014 24.2* 26.0 - 34.0 pg Final  . MCHC 08/03/2014 32.2  30.0 - 36.0 g/dL Final  . RDW 08/03/2014 14.5  11.5 - 15.5 % Final  . Platelets 08/03/2014 294  150 - 400 K/uL Final  . WBC 08/04/2014 9.4  4.0 - 10.5 K/uL Final   WHITE COUNT CONFIRMED ON SMEAR  . RBC 08/04/2014 3.50* 3.87 - 5.11 MIL/uL Final  . Hemoglobin 08/04/2014 8.9* 12.0 - 15.0 g/dL Final  . HCT 08/04/2014 26.5* 36.0 - 46.0 % Final  . MCV 08/04/2014 75.7* 78.0 - 100.0 fL Final  . MCH 08/04/2014 25.4* 26.0 - 34.0 pg Final  . MCHC 08/04/2014 33.6  30.0 - 36.0 g/dL Final  . RDW 08/04/2014 14.5  11.5 - 15.5 % Final  . Platelets 08/04/2014 265  150 - 400 K/uL Final  . Prothrombin Time 08/03/2014 15.7* 11.6 - 15.2 seconds Final  . INR 08/03/2014 1.23  0.00 - 1.49 Final  . aPTT 08/03/2014 28  24 - 37 seconds Final  . Glucose-Capillary 08/03/2014 145* 65 - 99 mg/dL Final  . WBC 08/04/2014 7.7  4.0 - 10.5 K/uL Final  . RBC 08/04/2014 3.59* 3.87 - 5.11 MIL/uL Final  . Hemoglobin 08/04/2014 8.8* 12.0 - 15.0 g/dL Final  . HCT 08/04/2014 26.8* 36.0 - 46.0 % Final  . MCV 08/04/2014 74.7* 78.0 - 100.0 fL Final  . MCH 08/04/2014 24.5* 26.0 - 34.0 pg Final  . MCHC 08/04/2014 32.8  30.0 - 36.0 g/dL Final  . RDW 08/04/2014 14.2  11.5 - 15.5 % Final  . Platelets 08/04/2014 233  150 - 400 K/uL Final  . Glucose-Capillary 08/04/2014 135* 65 - 99 mg/dL Final  . Glucose-Capillary 08/04/2014 225* 65 - 99 mg/dL Final  . Glucose-Capillary 08/04/2014 164* 65 - 99 mg/dL Final  . WBC 08/05/2014 8.2  4.0 - 10.5  K/uL Final  . RBC 08/05/2014 3.87  3.87 - 5.11 MIL/uL Final  . Hemoglobin 08/05/2014 9.4* 12.0 - 15.0 g/dL Final  . HCT 08/05/2014 29.1* 36.0 - 46.0 % Final  . MCV 08/05/2014 75.2* 78.0 - 100.0 fL Final  . MCH 08/05/2014 24.3* 26.0 - 34.0 pg Final  . MCHC 08/05/2014 32.3  30.0 - 36.0 g/dL Final  . RDW 08/05/2014 14.4  11.5 - 15.5 % Final  . Platelets 08/05/2014 258  150 - 400 K/uL Final  . Sodium 08/05/2014 137  135 - 145 mmol/L Final  . Potassium 08/05/2014 3.9  3.5 - 5.1 mmol/L Final  . Chloride 08/05/2014 100* 101 - 111 mmol/L Final  . CO2 08/05/2014 28  22 - 32 mmol/L Final  . Glucose, Bld 08/05/2014 144* 65 - 99 mg/dL Final  . BUN 08/05/2014 27* 6 - 20 mg/dL Final  . Creatinine, Ser 08/05/2014 1.01* 0.44 - 1.00 mg/dL Final  . Calcium 08/05/2014 9.3  8.9 - 10.3 mg/dL Final  . GFR calc non Af Amer 08/05/2014 51* >60 mL/min Final  . GFR calc Af Amer 08/05/2014 59* >60 mL/min Final   Comment: (NOTE) The eGFR has been calculated using the CKD EPI equation. This calculation has not been validated in all clinical situations. eGFR's persistently <60 mL/min signify possible Chronic Kidney Disease.   . Anion gap 08/05/2014 9  5 - 15 Final  . Glucose-Capillary 08/04/2014 100* 65 - 99 mg/dL Final  . MRSA by PCR 08/05/2014 NEGATIVE  NEGATIVE Final   Comment:  The GeneXpert MRSA Assay (FDA approved for NASAL specimens only), is one component of a comprehensive MRSA colonization surveillance program. It is not intended to diagnose MRSA infection nor to guide or monitor treatment for MRSA infections.   . Glucose-Capillary 08/05/2014 132* 65 - 99 mg/dL Final  . Glucose-Capillary 08/05/2014 198* 65 - 99 mg/dL Final  . Glucose-Capillary 08/05/2014 156* 65 - 99 mg/dL Final  Admission on 07/05/2014, Discharged on 07/09/2014  Component Date Value Ref Range Status  . Sodium 07/05/2014 138  135 - 145 mmol/L Final  . Potassium 07/05/2014 4.6  3.5 - 5.1 mmol/L Final  . Chloride  07/05/2014 103  101 - 111 mmol/L Final  . BUN 07/05/2014 39* 6 - 20 mg/dL Final  . Creatinine, Ser 07/05/2014 1.40* 0.44 - 1.00 mg/dL Final  . Glucose, Bld 07/05/2014 113* 65 - 99 mg/dL Final  . Calcium, Ion 07/05/2014 1.07* 1.13 - 1.30 mmol/L Final  . TCO2 07/05/2014 25  0 - 100 mmol/L Final  . Hemoglobin 07/05/2014 14.3  12.0 - 15.0 g/dL Final  . HCT 07/05/2014 42.0  36.0 - 46.0 % Final  . Alcohol, Ethyl (B) 07/05/2014 <5  <5 mg/dL Final   Comment:        LOWEST DETECTABLE LIMIT FOR SERUM ALCOHOL IS 5 mg/dL FOR MEDICAL PURPOSES ONLY   . Prothrombin Time 07/05/2014 14.5  11.6 - 15.2 seconds Final  . INR 07/05/2014 1.11  0.00 - 1.49 Final  . aPTT 07/05/2014 30  24 - 37 seconds Final  . WBC 07/05/2014 9.8  4.0 - 10.5 K/uL Final  . RBC 07/05/2014 4.25  3.87 - 5.11 MIL/uL Final  . Hemoglobin 07/05/2014 10.4* 12.0 - 15.0 g/dL Final  . HCT 07/05/2014 32.7* 36.0 - 46.0 % Final  . MCV 07/05/2014 76.9* 78.0 - 100.0 fL Final  . MCH 07/05/2014 24.5* 26.0 - 34.0 pg Final  . MCHC 07/05/2014 31.8  30.0 - 36.0 g/dL Final  . RDW 07/05/2014 14.9  11.5 - 15.5 % Final  . Platelets 07/05/2014 265  150 - 400 K/uL Final  . Neutrophils Relative % 07/05/2014 79* 43 - 77 % Final  . Neutro Abs 07/05/2014 7.8* 1.7 - 7.7 K/uL Final  . Lymphocytes Relative 07/05/2014 14  12 - 46 % Final  . Lymphs Abs 07/05/2014 1.4  0.7 - 4.0 K/uL Final  . Monocytes Relative 07/05/2014 6  3 - 12 % Final  . Monocytes Absolute 07/05/2014 0.6  0.1 - 1.0 K/uL Final  . Eosinophils Relative 07/05/2014 1  0 - 5 % Final  . Eosinophils Absolute 07/05/2014 0.1  0.0 - 0.7 K/uL Final  . Basophils Relative 07/05/2014 0  0 - 1 % Final  . Basophils Absolute 07/05/2014 0.0  0.0 - 0.1 K/uL Final  . Sodium 07/05/2014 137  135 - 145 mmol/L Final  . Potassium 07/05/2014 3.9  3.5 - 5.1 mmol/L Final  . Chloride 07/05/2014 104  101 - 111 mmol/L Final  . CO2 07/05/2014 24  22 - 32 mmol/L Final  . Glucose, Bld 07/05/2014 108* 65 - 99 mg/dL Final   . BUN 07/05/2014 25* 6 - 20 mg/dL Final  . Creatinine, Ser 07/05/2014 1.30* 0.44 - 1.00 mg/dL Final  . Calcium 07/05/2014 8.7* 8.9 - 10.3 mg/dL Final  . Total Protein 07/05/2014 6.0* 6.5 - 8.1 g/dL Final  . Albumin 07/05/2014 2.9* 3.5 - 5.0 g/dL Final  . AST 07/05/2014 23  15 - 41 U/L Final  . ALT 07/05/2014 9* 14 -  54 U/L Final  . Alkaline Phosphatase 07/05/2014 58  38 - 126 U/L Final  . Total Bilirubin 07/05/2014 0.5  0.3 - 1.2 mg/dL Final  . GFR calc non Af Amer 07/05/2014 37* >60 mL/min Final  . GFR calc Af Amer 07/05/2014 43* >60 mL/min Final   Comment: (NOTE) The eGFR has been calculated using the CKD EPI equation. This calculation has not been validated in all clinical situations. eGFR's persistently <60 mL/min signify possible Chronic Kidney Disease.   . Anion gap 07/05/2014 9  5 - 15 Final  . Troponin i, poc 07/05/2014 0.02  0.00 - 0.08 ng/mL Final  . Comment 3 07/05/2014          Final   Comment: Due to the release kinetics of cTnI, a negative result within the first hours of the onset of symptoms does not rule out myocardial infarction with certainty. If myocardial infarction is still suspected, repeat the test at appropriate intervals.   . Opiates 07/05/2014 NONE DETECTED  NONE DETECTED Final  . Cocaine 07/05/2014 NONE DETECTED  NONE DETECTED Final  . Benzodiazepines 07/05/2014 POSITIVE* NONE DETECTED Final  . Amphetamines 07/05/2014 NONE DETECTED  NONE DETECTED Final  . Tetrahydrocannabinol 07/05/2014 NONE DETECTED  NONE DETECTED Final  . Barbiturates 07/05/2014 NONE DETECTED  NONE DETECTED Final   Comment:        DRUG SCREEN FOR MEDICAL PURPOSES ONLY.  IF CONFIRMATION IS NEEDED FOR ANY PURPOSE, NOTIFY LAB WITHIN 5 DAYS.        LOWEST DETECTABLE LIMITS FOR URINE DRUG SCREEN Drug Class       Cutoff (ng/mL) Amphetamine      1000 Barbiturate      200 Benzodiazepine   182 Tricyclics       993 Opiates          300 Cocaine          300 THC              50     . Color, Urine 07/05/2014 YELLOW  YELLOW Final  . APPearance 07/05/2014 CLEAR  CLEAR Final  . Specific Gravity, Urine 07/05/2014 1.013  1.005 - 1.030 Final  . pH 07/05/2014 5.5  5.0 - 8.0 Final  . Glucose, UA 07/05/2014 NEGATIVE  NEGATIVE mg/dL Final  . Hgb urine dipstick 07/05/2014 TRACE* NEGATIVE Final  . Bilirubin Urine 07/05/2014 NEGATIVE  NEGATIVE Final  . Ketones, ur 07/05/2014 NEGATIVE  NEGATIVE mg/dL Final  . Protein, ur 07/05/2014 NEGATIVE  NEGATIVE mg/dL Final  . Urobilinogen, UA 07/05/2014 0.2  0.0 - 1.0 mg/dL Final  . Nitrite 07/05/2014 NEGATIVE  NEGATIVE Final  . Leukocytes, UA 07/05/2014 TRACE* NEGATIVE Final  . Glucose-Capillary 07/05/2014 119* 65 - 99 mg/dL Final  . Squamous Epithelial / LPF 07/05/2014 RARE  RARE Final  . WBC, UA 07/05/2014 0-2  <3 WBC/hpf Final  . Bacteria, UA 07/05/2014 MANY* RARE Final  . Glucose-Capillary 07/06/2014 75  65 - 99 mg/dL Final  . Comment 1 07/06/2014 Notify RN   Final  . Comment 2 07/06/2014 Document in Chart   Final  . Hgb A1c MFr Bld 07/05/2014 5.4  4.8 - 5.6 % Final   Comment: (NOTE)         Pre-diabetes: 5.7 - 6.4         Diabetes: >6.4         Glycemic control for adults with diabetes: <7.0   . Mean Plasma Glucose 07/05/2014 108   Final   Comment: (NOTE) Performed  At: [facility] [place], [place] [id number] Lindon Romp MD [id number]   . Glucose-Capillary 07/06/2014 79  65 - 99 mg/dL Final  . Comment 1 07/06/2014 Notify RN   Final  . Comment 2 07/06/2014 Document in Chart   Final  . Glucose-Capillary 07/06/2014 114* 65 - 99 mg/dL Final  . Glucose-Capillary 07/06/2014 173* 65 - 99 mg/dL Final  . Cholesterol 07/07/2014 110  0 - 200 mg/dL Final  . Triglycerides 07/07/2014 89  <150 mg/dL Final  . HDL 07/07/2014 37* >40 mg/dL Final  . Total CHOL/HDL Ratio 07/07/2014 3.0   Final  . VLDL 07/07/2014 18  0 - 40 mg/dL Final  . LDL Cholesterol 07/07/2014 55  0 - 99 mg/dL Final   Comment:         Total Cholesterol/HDL:CHD Risk Coronary Heart Disease Risk Table                     Men   Women  1/2 Average Risk   3.4   3.3  Average Risk       5.0   4.4  2 X Average Risk   9.6   7.1  3 X Average Risk  23.4   11.0        Use the calculated Patient Ratio above and the CHD Risk Table to determine the patient's CHD Risk.        ATP III CLASSIFICATION (LDL):  <100     mg/dL   Optimal  100-129  mg/dL   Near or Above                    Optimal  130-159  mg/dL   Borderline  160-189  mg/dL   High  >190     mg/dL   Very High   . Glucose-Capillary 07/06/2014 125* 65 - 99 mg/dL Final  . Glucose-Capillary 07/06/2014 124* 65 - 99 mg/dL Final  . Comment 1 07/06/2014 Notify RN   Final  . Comment 2 07/06/2014 Document in Chart   Final  . Glucose-Capillary 07/07/2014 114* 65 - 99 mg/dL Final  . Comment 1 07/07/2014 Notify RN   Final  . Comment 2 07/07/2014 Document in Chart   Final  . Homocysteine 07/07/2014 22.3* 0.0 - 15.0 umol/L Final   Comment: (NOTE) Performed At: [facility] [place], [place] [id number] Lindon Romp MD [id number]   . Vitamin B-12 07/07/2014 273  180 - 914 pg/mL Final   Comment: (NOTE) This assay is not validated for testing neonatal or myeloproliferative syndrome specimens for Vitamin B12 levels.   . Glucose-Capillary 07/07/2014 213* 65 - 99 mg/dL Final  . Glucose-Capillary 07/07/2014 147* 65 - 99 mg/dL Final  . Glucose-Capillary 07/07/2014 107* 65 - 99 mg/dL Final  . Comment 1 07/07/2014 Notify RN   Final  . Comment 2 07/07/2014 Document in Chart   Final  . Glucose-Capillary 07/08/2014 139* 65 - 99 mg/dL Final  . Comment 1 07/08/2014 Notify RN   Final  . Comment 2 07/08/2014 Document in Chart   Final  . Sodium 07/08/2014 137  135 - 145 mmol/L Final  . Potassium 07/08/2014 3.8  3.5 - 5.1 mmol/L Final  . Chloride 07/08/2014 102  101 - 111 mmol/L Final  . CO2 07/08/2014 27  22 - 32 mmol/L Final  . Glucose, Bld 07/08/2014 128*  65 - 99 mg/dL Final  . BUN 07/08/2014 23* 6 - 20 mg/dL Final  .  Creatinine, Ser 07/08/2014 1.52* 0.44 - 1.00 mg/dL Final  . Calcium 07/08/2014 8.6* 8.9 - 10.3 mg/dL Final  . Total Protein 07/08/2014 6.6  6.5 - 8.1 g/dL Final  . Albumin 07/08/2014 2.7* 3.5 - 5.0 g/dL Final  . AST 07/08/2014 18  15 - 41 U/L Final  . ALT 07/08/2014 9* 14 - 54 U/L Final  . Alkaline Phosphatase 07/08/2014 58  38 - 126 U/L Final  . Total Bilirubin 07/08/2014 0.8  0.3 - 1.2 mg/dL Final  . GFR calc non Af Amer 07/08/2014 31* >60 mL/min Final  . GFR calc Af Amer 07/08/2014 36* >60 mL/min Final   Comment: (NOTE) The eGFR has been calculated using the CKD EPI equation. This calculation has not been validated in all clinical situations. eGFR's persistently <60 mL/min signify possible Chronic Kidney Disease.   . Anion gap 07/08/2014 8  5 - 15 Final  . Glucose-Capillary 07/08/2014 176* 65 - 99 mg/dL Final  . Glucose-Capillary 07/08/2014 123* 65 - 99 mg/dL Final  . Glucose-Capillary 07/08/2014 160* 65 - 99 mg/dL Final  . Comment 1 07/08/2014 Notify RN   Final  . Comment 2 07/08/2014 Document in Chart   Final  . Glucose-Capillary 07/09/2014 103* 65 - 99 mg/dL Final  . Comment 1 07/09/2014 Notify RN   Final  . Comment 2 07/09/2014 Document in Chart   Final  . Glucose-Capillary 07/09/2014 180* 65 - 99 mg/dL Final  . Glucose-Capillary 07/09/2014 144* 65 - 99 mg/dL Final    Dg Knee Complete 4 Views Left  09/19/2014   CLINICAL DATA:  Generalized left knee pain.  No recent injury.  EXAM: LEFT KNEE - COMPLETE 4+ VIEW  COMPARISON:  None.  FINDINGS: Extensive sclerosis around the narrowed and spurred medial more than lateral compartments consistent with advanced osteoarthritis. Mild genu varus with lateral and posterior tibial plateau translation. Patellofemoral spurring is also advanced. Probable moderate knee joint effusion. 15 mm bone fragment lateral to the femoral condyles may reflect intra-articular body. No acute  fracture.  IMPRESSION: Severe knee osteoarthritis with lateral and posterior tibial translation.   Electronically Signed   By: Monte Fantasia M.D.   On: 09/19/2014 13:51   Dg Hip Unilat With Pelvis 2-3 Views Left  09/19/2014   CLINICAL DATA:  Generalized left knee and hip pain. No recent injury.  EXAM: DG HIP (WITH OR WITHOUT PELVIS) 2-3V LEFT  COMPARISON:  None.  FINDINGS: There is loss of joint space at both hips. Left hip is located. Degenerative changes are evident. No acute abnormality is present. The pelvis is intact. Degenerative changes in the lower lumbar spine are most evident at L3-4. Atherosclerotic changes are present at the aorta.  IMPRESSION: 1. Mild degenerative changes within both hips. 2. No acute abnormality. 3. Degenerative changes in the lumbar spine. 4. Atherosclerosis.   Electronically Signed   By: San Morelle M.D.   On: 09/19/2014 13:58     Assessment/Plan   ICD-9-CM ICD-10-CM   1. Stomatitis - probably multifactorial 528.00 K12.1   2. Essential hypertension - stable 401.9 I10   3. Dementia without behavioral disturbance - stable 294.20 F03.90   4. Type 2 diabetes mellitus with diabetic chronic kidney disease - stable 250.40 E11.22    585.9 N18.9   5. Depression with anxiety - stable 300.4 F41.8   6. CKD (chronic kidney disease), stage III - stable 585.3 N18.3   7. Hemiparesis affecting left side as late effect of cerebrovascular accident - stable 438.20 [phone]  8. Hypothyroidism, unspecified hypothyroidism type - stable 244.9 E03.9   9. Anemia in CKD (chronic kidney disease) - stable 285.21 N18.9    585.9 D63.1     --check BMP on 9/19th  --OT/ST as ordered. PT as indicated  --cont current meds as ordered  --GOAL: short term rehab and d/c home when medically appropriate. Communicated with pt and nursing.  Cassidey Barrales S. Perlie Gold  [facility] [address] [place], [place] [address] [phone] Cell  (Monday-Friday 8 AM - 5 PM) [phone] After 5 PM and follow prompts

## 2014-10-08 ENCOUNTER — Ambulatory Visit: Payer: Medicare Other | Admitting: Endocrinology

## 2014-10-09 ENCOUNTER — Other Ambulatory Visit (HOSPITAL_COMMUNITY): Payer: Self-pay | Admitting: Internal Medicine

## 2014-10-09 DIAGNOSIS — I639 Cerebral infarction, unspecified: Secondary | ICD-10-CM

## 2014-10-09 DIAGNOSIS — F0391 Unspecified dementia with behavioral disturbance: Secondary | ICD-10-CM

## 2014-10-09 DIAGNOSIS — F03918 Unspecified dementia, unspecified severity, with other behavioral disturbance: Secondary | ICD-10-CM

## 2014-10-16 ENCOUNTER — Ambulatory Visit (HOSPITAL_COMMUNITY)
Admission: RE | Admit: 2014-10-16 | Discharge: 2014-10-16 | Disposition: A | Discharge status: Home / Self Care | Payer: Medicare Other | Source: Ambulatory Visit | Attending: Internal Medicine | Admitting: Internal Medicine

## 2014-10-16 DIAGNOSIS — I639 Cerebral infarction, unspecified: Secondary | ICD-10-CM | POA: Diagnosis not present

## 2014-10-16 DIAGNOSIS — F03918 Unspecified dementia, unspecified severity, with other behavioral disturbance: Secondary | ICD-10-CM

## 2014-10-16 DIAGNOSIS — T17320D Food in larynx causing asphyxiation, subsequent encounter: Secondary | ICD-10-CM | POA: Diagnosis not present

## 2014-10-16 DIAGNOSIS — Z029 Encounter for administrative examinations, unspecified: Secondary | ICD-10-CM | POA: Diagnosis not present

## 2014-10-16 DIAGNOSIS — F0391 Unspecified dementia with behavioral disturbance: Secondary | ICD-10-CM

## 2014-10-16 DIAGNOSIS — R0989 Other specified symptoms and signs involving the circulatory and respiratory systems: Secondary | ICD-10-CM | POA: Diagnosis not present

## 2014-10-22 ENCOUNTER — Non-Acute Institutional Stay (SKILLED_NURSING_FACILITY): Payer: Medicare Other | Admitting: Adult Health

## 2014-10-22 DIAGNOSIS — E1122 Type 2 diabetes mellitus with diabetic chronic kidney disease: Secondary | ICD-10-CM | POA: Diagnosis not present

## 2014-10-22 DIAGNOSIS — Z794 Long term (current) use of insulin: Secondary | ICD-10-CM | POA: Diagnosis not present

## 2014-10-22 DIAGNOSIS — N183 Chronic kidney disease, stage 3 unspecified: Secondary | ICD-10-CM

## 2014-10-22 DIAGNOSIS — I6330 Cerebral infarction due to thrombosis of unspecified cerebral artery: Secondary | ICD-10-CM | POA: Diagnosis not present

## 2014-10-22 DIAGNOSIS — E038 Other specified hypothyroidism: Secondary | ICD-10-CM

## 2014-10-22 DIAGNOSIS — I10 Essential (primary) hypertension: Secondary | ICD-10-CM | POA: Diagnosis not present

## 2014-10-22 DIAGNOSIS — K5901 Slow transit constipation: Secondary | ICD-10-CM | POA: Diagnosis not present

## 2014-10-22 DIAGNOSIS — E034 Atrophy of thyroid (acquired): Secondary | ICD-10-CM

## 2014-10-24 LAB — HEMOGLOBIN A1C: HEMOGLOBIN A1C: 6.7

## 2014-10-26 DIAGNOSIS — F419 Anxiety disorder, unspecified: Secondary | ICD-10-CM | POA: Diagnosis not present

## 2014-10-26 DIAGNOSIS — F0391 Unspecified dementia with behavioral disturbance: Secondary | ICD-10-CM | POA: Diagnosis not present

## 2014-10-31 DIAGNOSIS — I639 Cerebral infarction, unspecified: Secondary | ICD-10-CM | POA: Diagnosis not present

## 2014-10-31 DIAGNOSIS — N183 Chronic kidney disease, stage 3 (moderate): Secondary | ICD-10-CM | POA: Diagnosis not present

## 2014-10-31 DIAGNOSIS — G40501 Epileptic seizures related to external causes, not intractable, with status epilepticus: Secondary | ICD-10-CM | POA: Diagnosis not present

## 2014-10-31 DIAGNOSIS — R1312 Dysphagia, oropharyngeal phase: Secondary | ICD-10-CM | POA: Diagnosis not present

## 2014-10-31 DIAGNOSIS — F339 Major depressive disorder, recurrent, unspecified: Secondary | ICD-10-CM | POA: Diagnosis not present

## 2014-10-31 DIAGNOSIS — E162 Hypoglycemia, unspecified: Secondary | ICD-10-CM | POA: Diagnosis not present

## 2014-10-31 DIAGNOSIS — E1122 Type 2 diabetes mellitus with diabetic chronic kidney disease: Secondary | ICD-10-CM | POA: Diagnosis not present

## 2014-10-31 DIAGNOSIS — E039 Hypothyroidism, unspecified: Secondary | ICD-10-CM | POA: Diagnosis not present

## 2014-10-31 DIAGNOSIS — M6281 Muscle weakness (generalized): Secondary | ICD-10-CM | POA: Diagnosis not present

## 2014-10-31 DIAGNOSIS — N390 Urinary tract infection, site not specified: Secondary | ICD-10-CM | POA: Diagnosis not present

## 2014-10-31 DIAGNOSIS — L8995 Pressure ulcer of unspecified site, unstageable: Secondary | ICD-10-CM | POA: Diagnosis not present

## 2014-10-31 DIAGNOSIS — I10 Essential (primary) hypertension: Secondary | ICD-10-CM | POA: Diagnosis not present

## 2014-10-31 DIAGNOSIS — D631 Anemia in chronic kidney disease: Secondary | ICD-10-CM | POA: Diagnosis not present

## 2014-10-31 DIAGNOSIS — R489 Unspecified symbolic dysfunctions: Secondary | ICD-10-CM | POA: Diagnosis not present

## 2014-11-01 DIAGNOSIS — E162 Hypoglycemia, unspecified: Secondary | ICD-10-CM | POA: Diagnosis not present

## 2014-11-01 DIAGNOSIS — I10 Essential (primary) hypertension: Secondary | ICD-10-CM | POA: Diagnosis not present

## 2014-11-01 DIAGNOSIS — E039 Hypothyroidism, unspecified: Secondary | ICD-10-CM | POA: Diagnosis not present

## 2014-11-01 DIAGNOSIS — E1122 Type 2 diabetes mellitus with diabetic chronic kidney disease: Secondary | ICD-10-CM | POA: Diagnosis not present

## 2014-11-01 DIAGNOSIS — D631 Anemia in chronic kidney disease: Secondary | ICD-10-CM | POA: Diagnosis not present

## 2014-11-01 DIAGNOSIS — F339 Major depressive disorder, recurrent, unspecified: Secondary | ICD-10-CM | POA: Diagnosis not present

## 2014-11-02 DIAGNOSIS — D631 Anemia in chronic kidney disease: Secondary | ICD-10-CM | POA: Diagnosis not present

## 2014-11-02 DIAGNOSIS — I10 Essential (primary) hypertension: Secondary | ICD-10-CM | POA: Diagnosis not present

## 2014-11-02 DIAGNOSIS — F339 Major depressive disorder, recurrent, unspecified: Secondary | ICD-10-CM | POA: Diagnosis not present

## 2014-11-02 DIAGNOSIS — E039 Hypothyroidism, unspecified: Secondary | ICD-10-CM | POA: Diagnosis not present

## 2014-11-02 DIAGNOSIS — E162 Hypoglycemia, unspecified: Secondary | ICD-10-CM | POA: Diagnosis not present

## 2014-11-02 DIAGNOSIS — E1122 Type 2 diabetes mellitus with diabetic chronic kidney disease: Secondary | ICD-10-CM | POA: Diagnosis not present

## 2014-11-04 DIAGNOSIS — E039 Hypothyroidism, unspecified: Secondary | ICD-10-CM | POA: Diagnosis not present

## 2014-11-04 DIAGNOSIS — D631 Anemia in chronic kidney disease: Secondary | ICD-10-CM | POA: Diagnosis not present

## 2014-11-04 DIAGNOSIS — E1122 Type 2 diabetes mellitus with diabetic chronic kidney disease: Secondary | ICD-10-CM | POA: Diagnosis not present

## 2014-11-04 DIAGNOSIS — F339 Major depressive disorder, recurrent, unspecified: Secondary | ICD-10-CM | POA: Diagnosis not present

## 2014-11-04 DIAGNOSIS — E162 Hypoglycemia, unspecified: Secondary | ICD-10-CM | POA: Diagnosis not present

## 2014-11-04 DIAGNOSIS — I10 Essential (primary) hypertension: Secondary | ICD-10-CM | POA: Diagnosis not present

## 2014-11-05 DIAGNOSIS — E1122 Type 2 diabetes mellitus with diabetic chronic kidney disease: Secondary | ICD-10-CM | POA: Diagnosis not present

## 2014-11-05 DIAGNOSIS — E162 Hypoglycemia, unspecified: Secondary | ICD-10-CM | POA: Diagnosis not present

## 2014-11-05 DIAGNOSIS — E039 Hypothyroidism, unspecified: Secondary | ICD-10-CM | POA: Diagnosis not present

## 2014-11-05 DIAGNOSIS — I10 Essential (primary) hypertension: Secondary | ICD-10-CM | POA: Diagnosis not present

## 2014-11-05 DIAGNOSIS — D631 Anemia in chronic kidney disease: Secondary | ICD-10-CM | POA: Diagnosis not present

## 2014-11-05 DIAGNOSIS — F339 Major depressive disorder, recurrent, unspecified: Secondary | ICD-10-CM | POA: Diagnosis not present

## 2014-11-06 ENCOUNTER — Other Ambulatory Visit: Payer: Self-pay | Admitting: Oncology

## 2014-11-06 ENCOUNTER — Ambulatory Visit: Payer: Medicare Other | Admitting: Endocrinology

## 2014-11-06 DIAGNOSIS — E1122 Type 2 diabetes mellitus with diabetic chronic kidney disease: Secondary | ICD-10-CM | POA: Diagnosis not present

## 2014-11-06 DIAGNOSIS — I10 Essential (primary) hypertension: Secondary | ICD-10-CM | POA: Diagnosis not present

## 2014-11-06 DIAGNOSIS — D631 Anemia in chronic kidney disease: Secondary | ICD-10-CM

## 2014-11-06 DIAGNOSIS — F339 Major depressive disorder, recurrent, unspecified: Secondary | ICD-10-CM | POA: Diagnosis not present

## 2014-11-06 DIAGNOSIS — E162 Hypoglycemia, unspecified: Secondary | ICD-10-CM | POA: Diagnosis not present

## 2014-11-06 DIAGNOSIS — N189 Chronic kidney disease, unspecified: Principal | ICD-10-CM

## 2014-11-06 DIAGNOSIS — E039 Hypothyroidism, unspecified: Secondary | ICD-10-CM | POA: Diagnosis not present

## 2014-11-07 ENCOUNTER — Telehealth: Payer: Self-pay | Admitting: Oncology

## 2014-11-07 DIAGNOSIS — E1122 Type 2 diabetes mellitus with diabetic chronic kidney disease: Secondary | ICD-10-CM | POA: Diagnosis not present

## 2014-11-07 DIAGNOSIS — D631 Anemia in chronic kidney disease: Secondary | ICD-10-CM | POA: Diagnosis not present

## 2014-11-07 DIAGNOSIS — F339 Major depressive disorder, recurrent, unspecified: Secondary | ICD-10-CM | POA: Diagnosis not present

## 2014-11-07 DIAGNOSIS — E162 Hypoglycemia, unspecified: Secondary | ICD-10-CM | POA: Diagnosis not present

## 2014-11-07 DIAGNOSIS — I10 Essential (primary) hypertension: Secondary | ICD-10-CM | POA: Diagnosis not present

## 2014-11-07 DIAGNOSIS — E039 Hypothyroidism, unspecified: Secondary | ICD-10-CM | POA: Diagnosis not present

## 2014-11-07 NOTE — Telephone Encounter (Signed)
Cancel 10/20 inj per pof  anne

## 2014-11-08 ENCOUNTER — Ambulatory Visit: Payer: Medicare Other

## 2014-11-08 ENCOUNTER — Ambulatory Visit (HOSPITAL_BASED_OUTPATIENT_CLINIC_OR_DEPARTMENT_OTHER): Payer: Medicare Other | Admitting: Oncology

## 2014-11-08 ENCOUNTER — Other Ambulatory Visit (HOSPITAL_BASED_OUTPATIENT_CLINIC_OR_DEPARTMENT_OTHER): Payer: Medicare Other

## 2014-11-08 ENCOUNTER — Encounter: Payer: Self-pay | Admitting: Oncology

## 2014-11-08 VITALS — BP 178/74 | HR 73 | Temp 97.5°F | Resp 18 | Ht 65.0 in

## 2014-11-08 DIAGNOSIS — N183 Chronic kidney disease, stage 3 unspecified: Secondary | ICD-10-CM

## 2014-11-08 DIAGNOSIS — D631 Anemia in chronic kidney disease: Secondary | ICD-10-CM

## 2014-11-08 DIAGNOSIS — G252 Other specified forms of tremor: Secondary | ICD-10-CM

## 2014-11-08 DIAGNOSIS — D638 Anemia in other chronic diseases classified elsewhere: Secondary | ICD-10-CM

## 2014-11-08 DIAGNOSIS — N189 Chronic kidney disease, unspecified: Secondary | ICD-10-CM | POA: Diagnosis not present

## 2014-11-08 DIAGNOSIS — E119 Type 2 diabetes mellitus without complications: Secondary | ICD-10-CM

## 2014-11-08 DIAGNOSIS — Z853 Personal history of malignant neoplasm of breast: Secondary | ICD-10-CM

## 2014-11-08 DIAGNOSIS — M8580 Other specified disorders of bone density and structure, unspecified site: Secondary | ICD-10-CM

## 2014-11-08 DIAGNOSIS — D500 Iron deficiency anemia secondary to blood loss (chronic): Secondary | ICD-10-CM

## 2014-11-08 DIAGNOSIS — F339 Major depressive disorder, recurrent, unspecified: Secondary | ICD-10-CM | POA: Diagnosis not present

## 2014-11-08 DIAGNOSIS — C50912 Malignant neoplasm of unspecified site of left female breast: Secondary | ICD-10-CM

## 2014-11-08 DIAGNOSIS — I10 Essential (primary) hypertension: Secondary | ICD-10-CM | POA: Diagnosis not present

## 2014-11-08 DIAGNOSIS — E1122 Type 2 diabetes mellitus with diabetic chronic kidney disease: Secondary | ICD-10-CM | POA: Diagnosis not present

## 2014-11-08 DIAGNOSIS — E039 Hypothyroidism, unspecified: Secondary | ICD-10-CM | POA: Diagnosis not present

## 2014-11-08 DIAGNOSIS — H547 Unspecified visual loss: Secondary | ICD-10-CM

## 2014-11-08 DIAGNOSIS — E162 Hypoglycemia, unspecified: Secondary | ICD-10-CM | POA: Diagnosis not present

## 2014-11-08 LAB — CBC WITH DIFFERENTIAL/PLATELET
BASO%: 0.3 % (ref 0.0–2.0)
BASOS ABS: 0 10*3/uL (ref 0.0–0.1)
EOS%: 1.1 % (ref 0.0–7.0)
Eosinophils Absolute: 0.1 10*3/uL (ref 0.0–0.5)
HEMATOCRIT: 29 % — AB (ref 34.8–46.6)
HGB: 9 g/dL — ABNORMAL LOW (ref 11.6–15.9)
LYMPH#: 1.4 10*3/uL (ref 0.9–3.3)
LYMPH%: 16.3 % (ref 14.0–49.7)
MCH: 23.9 pg — AB (ref 25.1–34.0)
MCHC: 31 g/dL — AB (ref 31.5–36.0)
MCV: 77.1 fL — ABNORMAL LOW (ref 79.5–101.0)
MONO#: 0.6 10*3/uL (ref 0.1–0.9)
MONO%: 7.1 % (ref 0.0–14.0)
NEUT#: 6.5 10*3/uL (ref 1.5–6.5)
NEUT%: 75.2 % (ref 38.4–76.8)
PLATELETS: 295 10*3/uL (ref 145–400)
RBC: 3.76 10*6/uL (ref 3.70–5.45)
RDW: 17.1 % — ABNORMAL HIGH (ref 11.2–14.5)
WBC: 8.7 10*3/uL (ref 3.9–10.3)

## 2014-11-08 LAB — IRON AND TIBC CHCC
%SAT: 19 % — ABNORMAL LOW (ref 21–57)
Iron: 22 ug/dL — ABNORMAL LOW (ref 41–142)
TIBC: 115 ug/dL — ABNORMAL LOW (ref 236–444)
UIBC: 93 ug/dL — AB (ref 120–384)

## 2014-11-08 NOTE — Progress Notes (Signed)
OFFICE PROGRESS NOTE   November 08, 2014   Physicians: physician for [facility]  INTERVAL HISTORY:  Patient is seen for visit scheduled in follow up of multifactorial anemia and prior left breast cancer for which she is on observation since completing Femara 06-2013. She was brought to office by transport from the SNF where she has been since most recent hospitalization 8-28 thru 09-20-14 for UTI and hypoglycemia. [place] Education officer, museum, who has been wonderful support for [name] at this office, is present during visit at patient's request.   Patient has previously received epogen, however most recent iron studies were low in late Aug (serum iron 13 and %sat 11), so will not benefit from epogen until/ unless iron is repleted. She has iron listed on medications from SNF now, likely not compliant before this living situation. She was transfused 2 units PRBCs during hospitalization 09-17-14. She has CKD III, also certainly contributing to the anemia. Last right mammogram was at [facility] 07-2013. As breast exam unremarkable today in this 81 yo lady with multiple significant medical problems, I do not feel that mammogram is necessary now.  Other significant medical problems include dementia, CVA with left sided hemiparesis, type 2 DM, HTN.  Patient tells me that she is happy in present living situation, especially as she has been eating well. She does request more assistance with her meals, as her vision is very poor and she has tremors in hands; I have made this comment on the forms for SNF. She is generally very debilitated, but denies SOB or pain now. She is not aware of any bleeding and not aware of any changes in right breast or at left mastectomy.  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 This was intermediate grade, ER positive 100%, PR +  70%, HER 2 negative. She had mastectomy with sentinel node evaluation, with 2 separate lesions, one 1.7 cm and the other 0.7 cm, margins clear and 2 sentinel nodes involved + one additional node negative. Patient declined radiation but received Femara from June 2010 thru June 2015.    Review of systems as above, also: Denies cough. Urinary incontinence not new, wears adult diapers.  Remainder of 10 point Review of Systems negative.  Objective:  Vital signs in last 24 hours:  BP 178/74 mmHg  Pulse 73  Temp(Src) 97.5 F (36.4 C) (Oral)  Resp 18  Ht 5\' 5"  (1.651 m)  Wt   SpO2 100% Elderly, frail appearing lady not in acute discomfort. Alert, knows Information systems manager, cooperative and pleasant.  In [place]. Poor historian, repetitive conversation.    HEENT:PERRL, sclerae not icteric. Oral mucosa moist without lesions. No JVD.  Lymphatics:no suraclavicular, axillary adenopathy Resp: clear to auscultation bilaterally  Cardio: regular rate and rhythm. No gallop. GI: soft, nontender, not distended. Normally active bowel sounds.  Musculoskeletal/ Extremities: UE and LE without pitting edema, cords, tenderness Neuro: speech fluent. Left sided weakness. Tremors UE.  Skin without rash, ecchymosis, petechiae Breasts: right without dominant mass, skin or nipple findings.Left mastectomy scar not remarkable. Axillae benign.   Lab Results:  Results for orders placed or performed in visit on 11/08/14  CBC with Differential  Result Value Ref Range   WBC 8.7 3.9 - 10.3 10e3/uL   NEUT# 6.5 1.5 - 6.5 10e3/uL   HGB 9.0 (L) 11.6 - 15.9 g/dL   HCT 29.0 (L) 34.8 - 46.6 %  Platelets 295 145 - 400 10e3/uL   MCV 77.1 (L) 79.5 - 101.0 fL   MCH 23.9 (L) 25.1 - 34.0 pg   MCHC 31.0 (L) 31.5 - 36.0 g/dL   RBC 3.76 3.70 - 5.45 10e6/uL   RDW 17.1 (H) 11.2 - 14.5 %   lymph# 1.4 0.9 - 3.3 10e3/uL   MONO# 0.6 0.1 - 0.9 10e3/uL   Eosinophils Absolute 0.1 0.0 - 0.5 10e3/uL   Basophils Absolute 0.0 0.0 - 0.1  10e3/uL   NEUT% 75.2 38.4 - 76.8 %   LYMPH% 16.3 14.0 - 49.7 %   MONO% 7.1 0.0 - 14.0 %   EOS% 1.1 0.0 - 7.0 %   BASO% 0.3 0.0 - 2.0 %     Studies/Results:  No results found.  Medications: I have reviewed the patient's current medications. Continue oral iron  DISCUSSION: I have told patient that she should continue the oral iron, no epogen until/ unless the iron deficiency is corrected. Right breast exam unremarkable, does not need another mammogram given rest of situation. I am glad to discuss with her other physicians, and can see her again if I can be of help, but have not set up a regular return appointment now.  Assessment/Plan: 1. Left breast cancer T1N1 ER/PR +, HER 2 negative, 2 sentinel nodes involved:  post mastectomy with 3 node axillary evaluation and 5 years of Femara thru 06-2013. No known active breast cancer. 2.osteopenia 3.anemia secondary to chronic kidney disease and chronic disease: iron low in 08-2014 so not appropriate to do further epogen at least until this is corrected. Hemoglobin stable now and she has good assistance taking medications as instructed now at SNF 4.degenerative arthritis, very limited mobility.  5.poor visual acuity : can no longer recognize faces 6.chronic tremor  7.diabetes on insulin: known to Dr Dwyane Dee 8.strong FH heart disease 9.CKD 3 10.  flu vaccine 09-2014 11.I do not know status of Advance Directive at SNF, none listed in EMR.   Patient is relieved that she does not need epogen injection and that right breast exam is unremarkable. She very much enjoyed visit from [facility]. Paperwork completed for nursing facility including copy of CBC from today. Time spent 15 min including >50% discussion and coordination of care.    [name] P, MD   11/08/2014, 1:52 PM

## 2014-11-09 DIAGNOSIS — F339 Major depressive disorder, recurrent, unspecified: Secondary | ICD-10-CM | POA: Diagnosis not present

## 2014-11-09 DIAGNOSIS — E1122 Type 2 diabetes mellitus with diabetic chronic kidney disease: Secondary | ICD-10-CM | POA: Diagnosis not present

## 2014-11-09 DIAGNOSIS — E162 Hypoglycemia, unspecified: Secondary | ICD-10-CM | POA: Diagnosis not present

## 2014-11-09 DIAGNOSIS — E039 Hypothyroidism, unspecified: Secondary | ICD-10-CM | POA: Diagnosis not present

## 2014-11-09 DIAGNOSIS — I10 Essential (primary) hypertension: Secondary | ICD-10-CM | POA: Diagnosis not present

## 2014-11-09 DIAGNOSIS — D631 Anemia in chronic kidney disease: Secondary | ICD-10-CM | POA: Diagnosis not present

## 2014-11-10 DIAGNOSIS — C50912 Malignant neoplasm of unspecified site of left female breast: Secondary | ICD-10-CM | POA: Insufficient documentation

## 2014-11-10 DIAGNOSIS — D500 Iron deficiency anemia secondary to blood loss (chronic): Secondary | ICD-10-CM | POA: Insufficient documentation

## 2014-11-10 DIAGNOSIS — N189 Chronic kidney disease, unspecified: Principal | ICD-10-CM

## 2014-11-10 DIAGNOSIS — D631 Anemia in chronic kidney disease: Secondary | ICD-10-CM | POA: Insufficient documentation

## 2014-11-12 DIAGNOSIS — E162 Hypoglycemia, unspecified: Secondary | ICD-10-CM | POA: Diagnosis not present

## 2014-11-12 DIAGNOSIS — F339 Major depressive disorder, recurrent, unspecified: Secondary | ICD-10-CM | POA: Diagnosis not present

## 2014-11-12 DIAGNOSIS — D631 Anemia in chronic kidney disease: Secondary | ICD-10-CM | POA: Diagnosis not present

## 2014-11-12 DIAGNOSIS — E1122 Type 2 diabetes mellitus with diabetic chronic kidney disease: Secondary | ICD-10-CM | POA: Diagnosis not present

## 2014-11-12 DIAGNOSIS — I10 Essential (primary) hypertension: Secondary | ICD-10-CM | POA: Diagnosis not present

## 2014-11-12 DIAGNOSIS — E039 Hypothyroidism, unspecified: Secondary | ICD-10-CM | POA: Diagnosis not present

## 2014-11-13 DIAGNOSIS — F339 Major depressive disorder, recurrent, unspecified: Secondary | ICD-10-CM | POA: Diagnosis not present

## 2014-11-13 DIAGNOSIS — D631 Anemia in chronic kidney disease: Secondary | ICD-10-CM | POA: Diagnosis not present

## 2014-11-13 DIAGNOSIS — I10 Essential (primary) hypertension: Secondary | ICD-10-CM | POA: Diagnosis not present

## 2014-11-13 DIAGNOSIS — E162 Hypoglycemia, unspecified: Secondary | ICD-10-CM | POA: Diagnosis not present

## 2014-11-13 DIAGNOSIS — F0391 Unspecified dementia with behavioral disturbance: Secondary | ICD-10-CM | POA: Diagnosis not present

## 2014-11-13 DIAGNOSIS — E1122 Type 2 diabetes mellitus with diabetic chronic kidney disease: Secondary | ICD-10-CM | POA: Diagnosis not present

## 2014-11-13 DIAGNOSIS — E039 Hypothyroidism, unspecified: Secondary | ICD-10-CM | POA: Diagnosis not present

## 2014-11-13 DIAGNOSIS — F419 Anxiety disorder, unspecified: Secondary | ICD-10-CM | POA: Diagnosis not present

## 2014-11-14 DIAGNOSIS — F339 Major depressive disorder, recurrent, unspecified: Secondary | ICD-10-CM | POA: Diagnosis not present

## 2014-11-14 DIAGNOSIS — E162 Hypoglycemia, unspecified: Secondary | ICD-10-CM | POA: Diagnosis not present

## 2014-11-14 DIAGNOSIS — D631 Anemia in chronic kidney disease: Secondary | ICD-10-CM | POA: Diagnosis not present

## 2014-11-14 DIAGNOSIS — E1122 Type 2 diabetes mellitus with diabetic chronic kidney disease: Secondary | ICD-10-CM | POA: Diagnosis not present

## 2014-11-14 DIAGNOSIS — E039 Hypothyroidism, unspecified: Secondary | ICD-10-CM | POA: Diagnosis not present

## 2014-11-14 DIAGNOSIS — I10 Essential (primary) hypertension: Secondary | ICD-10-CM | POA: Diagnosis not present

## 2014-11-15 DIAGNOSIS — E162 Hypoglycemia, unspecified: Secondary | ICD-10-CM | POA: Diagnosis not present

## 2014-11-15 DIAGNOSIS — E1122 Type 2 diabetes mellitus with diabetic chronic kidney disease: Secondary | ICD-10-CM | POA: Diagnosis not present

## 2014-11-15 DIAGNOSIS — F339 Major depressive disorder, recurrent, unspecified: Secondary | ICD-10-CM | POA: Diagnosis not present

## 2014-11-15 DIAGNOSIS — E039 Hypothyroidism, unspecified: Secondary | ICD-10-CM | POA: Diagnosis not present

## 2014-11-15 DIAGNOSIS — D631 Anemia in chronic kidney disease: Secondary | ICD-10-CM | POA: Diagnosis not present

## 2014-11-15 DIAGNOSIS — I10 Essential (primary) hypertension: Secondary | ICD-10-CM | POA: Diagnosis not present

## 2014-11-16 DIAGNOSIS — E1122 Type 2 diabetes mellitus with diabetic chronic kidney disease: Secondary | ICD-10-CM | POA: Diagnosis not present

## 2014-11-16 DIAGNOSIS — D631 Anemia in chronic kidney disease: Secondary | ICD-10-CM | POA: Diagnosis not present

## 2014-11-16 DIAGNOSIS — E162 Hypoglycemia, unspecified: Secondary | ICD-10-CM | POA: Diagnosis not present

## 2014-11-16 DIAGNOSIS — F339 Major depressive disorder, recurrent, unspecified: Secondary | ICD-10-CM | POA: Diagnosis not present

## 2014-11-16 DIAGNOSIS — I10 Essential (primary) hypertension: Secondary | ICD-10-CM | POA: Diagnosis not present

## 2014-11-16 DIAGNOSIS — E039 Hypothyroidism, unspecified: Secondary | ICD-10-CM | POA: Diagnosis not present

## 2014-11-17 DIAGNOSIS — F339 Major depressive disorder, recurrent, unspecified: Secondary | ICD-10-CM | POA: Diagnosis not present

## 2014-11-17 DIAGNOSIS — I10 Essential (primary) hypertension: Secondary | ICD-10-CM | POA: Diagnosis not present

## 2014-11-17 DIAGNOSIS — D631 Anemia in chronic kidney disease: Secondary | ICD-10-CM | POA: Diagnosis not present

## 2014-11-17 DIAGNOSIS — E162 Hypoglycemia, unspecified: Secondary | ICD-10-CM | POA: Diagnosis not present

## 2014-11-17 DIAGNOSIS — E039 Hypothyroidism, unspecified: Secondary | ICD-10-CM | POA: Diagnosis not present

## 2014-11-17 DIAGNOSIS — E1122 Type 2 diabetes mellitus with diabetic chronic kidney disease: Secondary | ICD-10-CM | POA: Diagnosis not present

## 2014-11-19 DIAGNOSIS — F339 Major depressive disorder, recurrent, unspecified: Secondary | ICD-10-CM | POA: Diagnosis not present

## 2014-11-19 DIAGNOSIS — E1122 Type 2 diabetes mellitus with diabetic chronic kidney disease: Secondary | ICD-10-CM | POA: Diagnosis not present

## 2014-11-19 DIAGNOSIS — I10 Essential (primary) hypertension: Secondary | ICD-10-CM | POA: Diagnosis not present

## 2014-11-19 DIAGNOSIS — E039 Hypothyroidism, unspecified: Secondary | ICD-10-CM | POA: Diagnosis not present

## 2014-11-19 DIAGNOSIS — D631 Anemia in chronic kidney disease: Secondary | ICD-10-CM | POA: Diagnosis not present

## 2014-11-19 DIAGNOSIS — E162 Hypoglycemia, unspecified: Secondary | ICD-10-CM | POA: Diagnosis not present

## 2014-11-20 DIAGNOSIS — F339 Major depressive disorder, recurrent, unspecified: Secondary | ICD-10-CM | POA: Diagnosis not present

## 2014-11-20 DIAGNOSIS — G40501 Epileptic seizures related to external causes, not intractable, with status epilepticus: Secondary | ICD-10-CM | POA: Diagnosis not present

## 2014-11-20 DIAGNOSIS — I639 Cerebral infarction, unspecified: Secondary | ICD-10-CM | POA: Diagnosis not present

## 2014-11-20 DIAGNOSIS — R489 Unspecified symbolic dysfunctions: Secondary | ICD-10-CM | POA: Diagnosis not present

## 2014-11-20 DIAGNOSIS — R1312 Dysphagia, oropharyngeal phase: Secondary | ICD-10-CM | POA: Diagnosis not present

## 2014-11-20 DIAGNOSIS — E162 Hypoglycemia, unspecified: Secondary | ICD-10-CM | POA: Diagnosis not present

## 2014-11-20 DIAGNOSIS — M6281 Muscle weakness (generalized): Secondary | ICD-10-CM | POA: Diagnosis not present

## 2014-11-20 DIAGNOSIS — E1122 Type 2 diabetes mellitus with diabetic chronic kidney disease: Secondary | ICD-10-CM | POA: Diagnosis not present

## 2014-11-20 DIAGNOSIS — I69859 Hemiplegia and hemiparesis following other cerebrovascular disease affecting unspecified side: Secondary | ICD-10-CM | POA: Diagnosis not present

## 2014-11-21 DIAGNOSIS — G40501 Epileptic seizures related to external causes, not intractable, with status epilepticus: Secondary | ICD-10-CM | POA: Diagnosis not present

## 2014-11-21 DIAGNOSIS — I639 Cerebral infarction, unspecified: Secondary | ICD-10-CM | POA: Diagnosis not present

## 2014-11-21 DIAGNOSIS — E1122 Type 2 diabetes mellitus with diabetic chronic kidney disease: Secondary | ICD-10-CM | POA: Diagnosis not present

## 2014-11-21 DIAGNOSIS — I69859 Hemiplegia and hemiparesis following other cerebrovascular disease affecting unspecified side: Secondary | ICD-10-CM | POA: Diagnosis not present

## 2014-11-21 DIAGNOSIS — E162 Hypoglycemia, unspecified: Secondary | ICD-10-CM | POA: Diagnosis not present

## 2014-11-21 DIAGNOSIS — M6281 Muscle weakness (generalized): Secondary | ICD-10-CM | POA: Diagnosis not present

## 2014-11-22 DIAGNOSIS — E1122 Type 2 diabetes mellitus with diabetic chronic kidney disease: Secondary | ICD-10-CM | POA: Diagnosis not present

## 2014-11-22 DIAGNOSIS — G40501 Epileptic seizures related to external causes, not intractable, with status epilepticus: Secondary | ICD-10-CM | POA: Diagnosis not present

## 2014-11-22 DIAGNOSIS — M6281 Muscle weakness (generalized): Secondary | ICD-10-CM | POA: Diagnosis not present

## 2014-11-22 DIAGNOSIS — I639 Cerebral infarction, unspecified: Secondary | ICD-10-CM | POA: Diagnosis not present

## 2014-11-22 DIAGNOSIS — I69859 Hemiplegia and hemiparesis following other cerebrovascular disease affecting unspecified side: Secondary | ICD-10-CM | POA: Diagnosis not present

## 2014-11-22 DIAGNOSIS — E162 Hypoglycemia, unspecified: Secondary | ICD-10-CM | POA: Diagnosis not present

## 2014-11-23 DIAGNOSIS — E162 Hypoglycemia, unspecified: Secondary | ICD-10-CM | POA: Diagnosis not present

## 2014-11-23 DIAGNOSIS — E1122 Type 2 diabetes mellitus with diabetic chronic kidney disease: Secondary | ICD-10-CM | POA: Diagnosis not present

## 2014-11-23 DIAGNOSIS — I69859 Hemiplegia and hemiparesis following other cerebrovascular disease affecting unspecified side: Secondary | ICD-10-CM | POA: Diagnosis not present

## 2014-11-23 DIAGNOSIS — I639 Cerebral infarction, unspecified: Secondary | ICD-10-CM | POA: Diagnosis not present

## 2014-11-23 DIAGNOSIS — G40501 Epileptic seizures related to external causes, not intractable, with status epilepticus: Secondary | ICD-10-CM | POA: Diagnosis not present

## 2014-11-23 DIAGNOSIS — M6281 Muscle weakness (generalized): Secondary | ICD-10-CM | POA: Diagnosis not present

## 2014-11-26 DIAGNOSIS — I639 Cerebral infarction, unspecified: Secondary | ICD-10-CM | POA: Diagnosis not present

## 2014-11-26 DIAGNOSIS — M6281 Muscle weakness (generalized): Secondary | ICD-10-CM | POA: Diagnosis not present

## 2014-11-26 DIAGNOSIS — E1122 Type 2 diabetes mellitus with diabetic chronic kidney disease: Secondary | ICD-10-CM | POA: Diagnosis not present

## 2014-11-26 DIAGNOSIS — I69859 Hemiplegia and hemiparesis following other cerebrovascular disease affecting unspecified side: Secondary | ICD-10-CM | POA: Diagnosis not present

## 2014-11-26 DIAGNOSIS — E162 Hypoglycemia, unspecified: Secondary | ICD-10-CM | POA: Diagnosis not present

## 2014-11-26 DIAGNOSIS — G40501 Epileptic seizures related to external causes, not intractable, with status epilepticus: Secondary | ICD-10-CM | POA: Diagnosis not present

## 2014-11-27 DIAGNOSIS — G40501 Epileptic seizures related to external causes, not intractable, with status epilepticus: Secondary | ICD-10-CM | POA: Diagnosis not present

## 2014-11-27 DIAGNOSIS — E162 Hypoglycemia, unspecified: Secondary | ICD-10-CM | POA: Diagnosis not present

## 2014-11-27 DIAGNOSIS — I639 Cerebral infarction, unspecified: Secondary | ICD-10-CM | POA: Diagnosis not present

## 2014-11-27 DIAGNOSIS — I69859 Hemiplegia and hemiparesis following other cerebrovascular disease affecting unspecified side: Secondary | ICD-10-CM | POA: Diagnosis not present

## 2014-11-27 DIAGNOSIS — M6281 Muscle weakness (generalized): Secondary | ICD-10-CM | POA: Diagnosis not present

## 2014-11-27 DIAGNOSIS — E1122 Type 2 diabetes mellitus with diabetic chronic kidney disease: Secondary | ICD-10-CM | POA: Diagnosis not present

## 2014-11-28 DIAGNOSIS — E162 Hypoglycemia, unspecified: Secondary | ICD-10-CM | POA: Diagnosis not present

## 2014-11-28 DIAGNOSIS — I639 Cerebral infarction, unspecified: Secondary | ICD-10-CM | POA: Diagnosis not present

## 2014-11-28 DIAGNOSIS — I69859 Hemiplegia and hemiparesis following other cerebrovascular disease affecting unspecified side: Secondary | ICD-10-CM | POA: Diagnosis not present

## 2014-11-28 DIAGNOSIS — M6281 Muscle weakness (generalized): Secondary | ICD-10-CM | POA: Diagnosis not present

## 2014-11-28 DIAGNOSIS — F419 Anxiety disorder, unspecified: Secondary | ICD-10-CM | POA: Diagnosis not present

## 2014-11-28 DIAGNOSIS — F0391 Unspecified dementia with behavioral disturbance: Secondary | ICD-10-CM | POA: Diagnosis not present

## 2014-11-28 DIAGNOSIS — G40501 Epileptic seizures related to external causes, not intractable, with status epilepticus: Secondary | ICD-10-CM | POA: Diagnosis not present

## 2014-11-28 DIAGNOSIS — E1122 Type 2 diabetes mellitus with diabetic chronic kidney disease: Secondary | ICD-10-CM | POA: Diagnosis not present

## 2014-11-29 DIAGNOSIS — I639 Cerebral infarction, unspecified: Secondary | ICD-10-CM | POA: Diagnosis not present

## 2014-11-29 DIAGNOSIS — E162 Hypoglycemia, unspecified: Secondary | ICD-10-CM | POA: Diagnosis not present

## 2014-11-29 DIAGNOSIS — E1122 Type 2 diabetes mellitus with diabetic chronic kidney disease: Secondary | ICD-10-CM | POA: Diagnosis not present

## 2014-11-29 DIAGNOSIS — I69859 Hemiplegia and hemiparesis following other cerebrovascular disease affecting unspecified side: Secondary | ICD-10-CM | POA: Diagnosis not present

## 2014-11-29 DIAGNOSIS — G40501 Epileptic seizures related to external causes, not intractable, with status epilepticus: Secondary | ICD-10-CM | POA: Diagnosis not present

## 2014-11-29 DIAGNOSIS — M6281 Muscle weakness (generalized): Secondary | ICD-10-CM | POA: Diagnosis not present

## 2014-11-30 ENCOUNTER — Non-Acute Institutional Stay (SKILLED_NURSING_FACILITY): Payer: Medicare Other | Admitting: Adult Health

## 2014-11-30 DIAGNOSIS — I10 Essential (primary) hypertension: Secondary | ICD-10-CM | POA: Diagnosis not present

## 2014-11-30 DIAGNOSIS — E876 Hypokalemia: Secondary | ICD-10-CM | POA: Diagnosis not present

## 2014-11-30 DIAGNOSIS — F0390 Unspecified dementia without behavioral disturbance: Secondary | ICD-10-CM

## 2014-11-30 DIAGNOSIS — Z794 Long term (current) use of insulin: Secondary | ICD-10-CM | POA: Diagnosis not present

## 2014-11-30 DIAGNOSIS — I69354 Hemiplegia and hemiparesis following cerebral infarction affecting left non-dominant side: Secondary | ICD-10-CM | POA: Diagnosis not present

## 2014-11-30 DIAGNOSIS — E034 Atrophy of thyroid (acquired): Secondary | ICD-10-CM | POA: Diagnosis not present

## 2014-11-30 DIAGNOSIS — E038 Other specified hypothyroidism: Secondary | ICD-10-CM

## 2014-11-30 DIAGNOSIS — G40501 Epileptic seizures related to external causes, not intractable, with status epilepticus: Secondary | ICD-10-CM | POA: Diagnosis not present

## 2014-11-30 DIAGNOSIS — N183 Chronic kidney disease, stage 3 unspecified: Secondary | ICD-10-CM

## 2014-11-30 DIAGNOSIS — M6281 Muscle weakness (generalized): Secondary | ICD-10-CM | POA: Diagnosis not present

## 2014-11-30 DIAGNOSIS — E1122 Type 2 diabetes mellitus with diabetic chronic kidney disease: Secondary | ICD-10-CM

## 2014-11-30 DIAGNOSIS — I6330 Cerebral infarction due to thrombosis of unspecified cerebral artery: Secondary | ICD-10-CM | POA: Diagnosis not present

## 2014-11-30 DIAGNOSIS — R634 Abnormal weight loss: Secondary | ICD-10-CM

## 2014-11-30 DIAGNOSIS — I69859 Hemiplegia and hemiparesis following other cerebrovascular disease affecting unspecified side: Secondary | ICD-10-CM | POA: Diagnosis not present

## 2014-11-30 DIAGNOSIS — E162 Hypoglycemia, unspecified: Secondary | ICD-10-CM | POA: Diagnosis not present

## 2014-11-30 DIAGNOSIS — I639 Cerebral infarction, unspecified: Secondary | ICD-10-CM | POA: Diagnosis not present

## 2014-12-02 DIAGNOSIS — G40501 Epileptic seizures related to external causes, not intractable, with status epilepticus: Secondary | ICD-10-CM | POA: Diagnosis not present

## 2014-12-02 DIAGNOSIS — E162 Hypoglycemia, unspecified: Secondary | ICD-10-CM | POA: Diagnosis not present

## 2014-12-02 DIAGNOSIS — I69859 Hemiplegia and hemiparesis following other cerebrovascular disease affecting unspecified side: Secondary | ICD-10-CM | POA: Diagnosis not present

## 2014-12-02 DIAGNOSIS — I639 Cerebral infarction, unspecified: Secondary | ICD-10-CM | POA: Diagnosis not present

## 2014-12-02 DIAGNOSIS — M6281 Muscle weakness (generalized): Secondary | ICD-10-CM | POA: Diagnosis not present

## 2014-12-02 DIAGNOSIS — E1122 Type 2 diabetes mellitus with diabetic chronic kidney disease: Secondary | ICD-10-CM | POA: Diagnosis not present

## 2014-12-03 DIAGNOSIS — E162 Hypoglycemia, unspecified: Secondary | ICD-10-CM | POA: Diagnosis not present

## 2014-12-03 DIAGNOSIS — E1122 Type 2 diabetes mellitus with diabetic chronic kidney disease: Secondary | ICD-10-CM | POA: Diagnosis not present

## 2014-12-03 DIAGNOSIS — I69859 Hemiplegia and hemiparesis following other cerebrovascular disease affecting unspecified side: Secondary | ICD-10-CM | POA: Diagnosis not present

## 2014-12-03 DIAGNOSIS — G40501 Epileptic seizures related to external causes, not intractable, with status epilepticus: Secondary | ICD-10-CM | POA: Diagnosis not present

## 2014-12-03 DIAGNOSIS — I639 Cerebral infarction, unspecified: Secondary | ICD-10-CM | POA: Diagnosis not present

## 2014-12-03 DIAGNOSIS — M6281 Muscle weakness (generalized): Secondary | ICD-10-CM | POA: Diagnosis not present

## 2014-12-04 DIAGNOSIS — I69859 Hemiplegia and hemiparesis following other cerebrovascular disease affecting unspecified side: Secondary | ICD-10-CM | POA: Diagnosis not present

## 2014-12-04 DIAGNOSIS — E162 Hypoglycemia, unspecified: Secondary | ICD-10-CM | POA: Diagnosis not present

## 2014-12-04 DIAGNOSIS — I639 Cerebral infarction, unspecified: Secondary | ICD-10-CM | POA: Diagnosis not present

## 2014-12-04 DIAGNOSIS — E1122 Type 2 diabetes mellitus with diabetic chronic kidney disease: Secondary | ICD-10-CM | POA: Diagnosis not present

## 2014-12-04 DIAGNOSIS — G40501 Epileptic seizures related to external causes, not intractable, with status epilepticus: Secondary | ICD-10-CM | POA: Diagnosis not present

## 2014-12-04 DIAGNOSIS — M6281 Muscle weakness (generalized): Secondary | ICD-10-CM | POA: Diagnosis not present

## 2014-12-05 DIAGNOSIS — G40501 Epileptic seizures related to external causes, not intractable, with status epilepticus: Secondary | ICD-10-CM | POA: Diagnosis not present

## 2014-12-05 DIAGNOSIS — I639 Cerebral infarction, unspecified: Secondary | ICD-10-CM | POA: Diagnosis not present

## 2014-12-05 DIAGNOSIS — E162 Hypoglycemia, unspecified: Secondary | ICD-10-CM | POA: Diagnosis not present

## 2014-12-05 DIAGNOSIS — E1122 Type 2 diabetes mellitus with diabetic chronic kidney disease: Secondary | ICD-10-CM | POA: Diagnosis not present

## 2014-12-05 DIAGNOSIS — I69859 Hemiplegia and hemiparesis following other cerebrovascular disease affecting unspecified side: Secondary | ICD-10-CM | POA: Diagnosis not present

## 2014-12-05 DIAGNOSIS — M6281 Muscle weakness (generalized): Secondary | ICD-10-CM | POA: Diagnosis not present

## 2014-12-06 DIAGNOSIS — E162 Hypoglycemia, unspecified: Secondary | ICD-10-CM | POA: Diagnosis not present

## 2014-12-06 DIAGNOSIS — I639 Cerebral infarction, unspecified: Secondary | ICD-10-CM | POA: Diagnosis not present

## 2014-12-06 DIAGNOSIS — M6281 Muscle weakness (generalized): Secondary | ICD-10-CM | POA: Diagnosis not present

## 2014-12-06 DIAGNOSIS — E1122 Type 2 diabetes mellitus with diabetic chronic kidney disease: Secondary | ICD-10-CM | POA: Diagnosis not present

## 2014-12-06 DIAGNOSIS — G40501 Epileptic seizures related to external causes, not intractable, with status epilepticus: Secondary | ICD-10-CM | POA: Diagnosis not present

## 2014-12-06 DIAGNOSIS — I69859 Hemiplegia and hemiparesis following other cerebrovascular disease affecting unspecified side: Secondary | ICD-10-CM | POA: Diagnosis not present

## 2014-12-07 DIAGNOSIS — I69859 Hemiplegia and hemiparesis following other cerebrovascular disease affecting unspecified side: Secondary | ICD-10-CM | POA: Diagnosis not present

## 2014-12-07 DIAGNOSIS — E162 Hypoglycemia, unspecified: Secondary | ICD-10-CM | POA: Diagnosis not present

## 2014-12-07 DIAGNOSIS — E1122 Type 2 diabetes mellitus with diabetic chronic kidney disease: Secondary | ICD-10-CM | POA: Diagnosis not present

## 2014-12-07 DIAGNOSIS — G40501 Epileptic seizures related to external causes, not intractable, with status epilepticus: Secondary | ICD-10-CM | POA: Diagnosis not present

## 2014-12-07 DIAGNOSIS — M6281 Muscle weakness (generalized): Secondary | ICD-10-CM | POA: Diagnosis not present

## 2014-12-07 DIAGNOSIS — I639 Cerebral infarction, unspecified: Secondary | ICD-10-CM | POA: Diagnosis not present

## 2014-12-09 DIAGNOSIS — G40501 Epileptic seizures related to external causes, not intractable, with status epilepticus: Secondary | ICD-10-CM | POA: Diagnosis not present

## 2014-12-09 DIAGNOSIS — E162 Hypoglycemia, unspecified: Secondary | ICD-10-CM | POA: Diagnosis not present

## 2014-12-09 DIAGNOSIS — I69859 Hemiplegia and hemiparesis following other cerebrovascular disease affecting unspecified side: Secondary | ICD-10-CM | POA: Diagnosis not present

## 2014-12-09 DIAGNOSIS — I639 Cerebral infarction, unspecified: Secondary | ICD-10-CM | POA: Diagnosis not present

## 2014-12-09 DIAGNOSIS — M6281 Muscle weakness (generalized): Secondary | ICD-10-CM | POA: Diagnosis not present

## 2014-12-09 DIAGNOSIS — E1122 Type 2 diabetes mellitus with diabetic chronic kidney disease: Secondary | ICD-10-CM | POA: Diagnosis not present

## 2014-12-10 DIAGNOSIS — G40501 Epileptic seizures related to external causes, not intractable, with status epilepticus: Secondary | ICD-10-CM | POA: Diagnosis not present

## 2014-12-10 DIAGNOSIS — M6281 Muscle weakness (generalized): Secondary | ICD-10-CM | POA: Diagnosis not present

## 2014-12-10 DIAGNOSIS — E1122 Type 2 diabetes mellitus with diabetic chronic kidney disease: Secondary | ICD-10-CM | POA: Diagnosis not present

## 2014-12-10 DIAGNOSIS — E162 Hypoglycemia, unspecified: Secondary | ICD-10-CM | POA: Diagnosis not present

## 2014-12-10 DIAGNOSIS — I639 Cerebral infarction, unspecified: Secondary | ICD-10-CM | POA: Diagnosis not present

## 2014-12-10 DIAGNOSIS — I69859 Hemiplegia and hemiparesis following other cerebrovascular disease affecting unspecified side: Secondary | ICD-10-CM | POA: Diagnosis not present

## 2014-12-11 DIAGNOSIS — G40501 Epileptic seizures related to external causes, not intractable, with status epilepticus: Secondary | ICD-10-CM | POA: Diagnosis not present

## 2014-12-11 DIAGNOSIS — I69859 Hemiplegia and hemiparesis following other cerebrovascular disease affecting unspecified side: Secondary | ICD-10-CM | POA: Diagnosis not present

## 2014-12-11 DIAGNOSIS — E1122 Type 2 diabetes mellitus with diabetic chronic kidney disease: Secondary | ICD-10-CM | POA: Diagnosis not present

## 2014-12-11 DIAGNOSIS — I639 Cerebral infarction, unspecified: Secondary | ICD-10-CM | POA: Diagnosis not present

## 2014-12-11 DIAGNOSIS — M6281 Muscle weakness (generalized): Secondary | ICD-10-CM | POA: Diagnosis not present

## 2014-12-11 DIAGNOSIS — E162 Hypoglycemia, unspecified: Secondary | ICD-10-CM | POA: Diagnosis not present

## 2014-12-12 DIAGNOSIS — G40501 Epileptic seizures related to external causes, not intractable, with status epilepticus: Secondary | ICD-10-CM | POA: Diagnosis not present

## 2014-12-12 DIAGNOSIS — M6281 Muscle weakness (generalized): Secondary | ICD-10-CM | POA: Diagnosis not present

## 2014-12-12 DIAGNOSIS — E162 Hypoglycemia, unspecified: Secondary | ICD-10-CM | POA: Diagnosis not present

## 2014-12-12 DIAGNOSIS — I639 Cerebral infarction, unspecified: Secondary | ICD-10-CM | POA: Diagnosis not present

## 2014-12-12 DIAGNOSIS — I69859 Hemiplegia and hemiparesis following other cerebrovascular disease affecting unspecified side: Secondary | ICD-10-CM | POA: Diagnosis not present

## 2014-12-12 DIAGNOSIS — E1122 Type 2 diabetes mellitus with diabetic chronic kidney disease: Secondary | ICD-10-CM | POA: Diagnosis not present

## 2014-12-13 ENCOUNTER — Encounter: Payer: Self-pay | Admitting: Adult Health

## 2014-12-13 DIAGNOSIS — I69354 Hemiplegia and hemiparesis following cerebral infarction affecting left non-dominant side: Secondary | ICD-10-CM | POA: Insufficient documentation

## 2014-12-13 DIAGNOSIS — R634 Abnormal weight loss: Secondary | ICD-10-CM | POA: Insufficient documentation

## 2014-12-13 DIAGNOSIS — E1129 Type 2 diabetes mellitus with other diabetic kidney complication: Secondary | ICD-10-CM | POA: Insufficient documentation

## 2014-12-13 MED ORDER — POTASSIUM CHLORIDE 20 MEQ PO PACK: 40.0000 meq | PACK | Freq: Every day | ORAL | Status: DC

## 2014-12-13 MED ORDER — MIRTAZAPINE 7.5 MG PO TABS: 7.5000 mg | ORAL_TABLET | Freq: Every day | ORAL | Status: DC

## 2014-12-13 MED ORDER — LISINOPRIL 10 MG PO TABS: 20.0000 mg | ORAL_TABLET | Freq: Every day | ORAL | Status: DC

## 2014-12-13 NOTE — Progress Notes (Signed)
Patient ID: [name], female   DOB: Oct 20, 1930, 81 y.o.   MRN: [id number]   Facility: [name]      Allergies  Allergen Reactions  . Aspirin Hives  . Penicillins Hives    Chief Complaint  Patient presents with  . Medical Management of Chronic Issues    HPI:  She is a long term resident of this facility being seen for the management of her chronic illnesses. She has lost a significant amount of weight from 157.8 pounds in Oct to her current weight is 144.6 pounds. I am not certain if this weight is accurate or not; however; there are no other weights to go on. I have been told that her appetite is poor.  She has been started on aricept 5 mg daily. Her k+ is slightly low at 3.4. She is unable to fully participate in the hpi or ros.    Past Medical History  Diagnosis Date  . Hemorrhoid   . Anemia   . Arthritis   . Stroke ([place])   . Diabetes mellitus without complication ([place])   . Hypertension   . Chronic kidney disease   . Thyroid disease     Past Surgical History  Procedure Laterality Date  . Breast lumpectomy      LEFT  . Abdominal hysterectomy      PARTIAL  . Mastectomy  05/2008    Left    VITAL SIGNS BP 182/72 mmHg  Pulse 64  Ht 5\' 1"  (1.549 m)  Wt 144 lb 9.6 oz (65.59 kg)  BMI 27.34 kg/m2  Patient's Medications  New Prescriptions   No medications on file  Previous Medications   ASCORBIC ACID (VITAMIN C) 1000 MG TABLET    Take 1,000 mg by mouth daily.   CHOLECALCIFEROL (VITAMIN D) 2000 UNITS TABLET    Take 2,000 Units by mouth daily.   CLOPIDOGREL (PLAVIX) 75 MG TABLET    Take 1 tablet (75 mg total) by mouth daily.   DONEPEZIL (ARICEPT) 5 MG TABLET    Take 5 mg by mouth at bedtime.   FERROUS SULFATE 325 (65 FE) MG TABLET    Take 325 mg by mouth 2 (two) times daily with a meal.   FOOD THICKENER (THICK IT) POWD    Take 1 Container by mouth as needed. Nectar thick   HYDROCODONE-ACETAMINOPHEN (NORCO) 10-325 MG PER TABLET    Take 1 tablet by mouth 2  (two) times daily.   INSULIN LISPRO (HUMALOG) 100 UNIT/ML INJECTION    Inject 5 Units into the skin 3 (three) times daily. Give 5 units for cbg >=150   LEVOTHYROXINE (SYNTHROID, LEVOTHROID) 112 MCG TABLET    Take 112 mcg by mouth daily before breakfast.   LISINOPRIL (PRINIVIL,ZESTRIL) 10 MG TABLET    Take 10 mg by mouth daily.    LORAZEPAM (ATIVAN) 0.5 MG TABLET    Take 1 tablet (0.5 mg total) by mouth every 8 (eight) hours as needed for anxiety.   PAROXETINE (PAXIL) 10 MG TABLET    Take 40 mg by mouth daily.    POTASSIUM CHLORIDE (KLOR-CON) 20 MEQ PACKET    Take 20 mEq by mouth daily. crystals   ROPINIROLE (REQUIP) 3 MG TABLET    Take 3 mg by mouth at bedtime.    SACCHAROMYCES BOULARDII (FLORASTOR) 250 MG CAPSULE    Take 250 mg by mouth 2 (two) times daily.   VITAMIN B-12 1000 MCG TABLET    Take 1 tablet (1,000 mcg total) by mouth  daily.  Modified Medications   No medications on file  Discontinued Medications   No medications on file     SIGNIFICANT DIAGNOSTIC EXAMS    09-19-14: left knee x-ray: Severe knee osteoarthritis with lateral and posterior tibial translation.  09-19-14: bilateral hip and pelvis x-ray: 1. Mild degenerative changes within both hips. 2. No acute abnormality. 3. Degenerative changes in the lumbar spine. 4. Atherosclerosis.   LABS REVIEWED:   07-05-14: hgb a1c 5.4 07-07-14: chol 10; ldl 55; trig 89 ;hdl 37 09-16-14: wbc 7.9; hgb 7.1; hct 23.3; mcv 75.9; plt 338; glucose 90; bun 22; creat 0.94; k+3.2; na++135; liver normal albumin 2.3; urine culture: e-coli 09-17-14: wbc 7.4; hgb 7.7; hct 25.0; mcv 76.9; plt 319; glucose 09; bun ;23 creat 0.99; k+3.4; na++136; iron 13; tibc 113; tsh 6.520; vit b12: 1838; folate 10.8 09-19-14: glucose 115; bun 16; creat 0.79; k+4.4; na++136; free t4: 1.17; mag 1.4 09-20-14: wbc 6.2; hgb 9.1; hct 29.1; mcv 78.0; plt 319; glucose 108; bun 15; creat 0.82; k+4.1; na++134; liver normal albumin 2.3  09-26-14: wbc 5.2; hgb 9.8; hct 31.7; mcv  76.4; plt 353; glucose 129; bun 14; creat 0.79; k+ 4.3; na++139; liver normal albumin 2.7 10-24-14: hgb a1c 6.7 11-05-14 glucose 126; bun 10; creat 0.71; k+ 3.4; na++142 11-08-14: wbc 8.7; hgb 9.0; hct 29.0; mcv 77.1; plt 295; iron 22; tibc 115      Review of Systems  Unable to perform ROS: Other      Physical Exam  Constitutional: No distress.  Eyes: Conjunctivae are normal.  Neck: Neck supple. No JVD present. No thyromegaly present.  Cardiovascular: Normal rate, regular rhythm and intact distal pulses.   Respiratory: Effort normal and breath sounds normal. No respiratory distress. She has no wheezes.  GI: Soft. Bowel sounds are normal. She exhibits no distension. There is no tenderness.  Musculoskeletal: She exhibits no edema.  Left hemiparesis    Lymphadenopathy:    She has no cervical adenopathy.  Neurological: She is alert.  Skin: Skin is warm and dry. She is not diaphoretic.  Has profon boots to both feet   Psychiatric: She has a normal mood and affect.     ASSESSMENT/ PLAN:  1. CVA: with left hemiparesis: is neurologically stable; will continue plavix 75 mg daily takes vicodin 10/325 mg twice daily for pain management and will monitor   2. Hypothyroidism: will continue synthroid 112 mcg daily and will monitor her free t4: 1.17; tsh 6.520  3. Anemia: is presently stable will continue iron twice daily; vit b12 1000 mcg daily  hgb 9.0; iron level is up to 22 from  13; will monitor  4. Diabetes: is stable will continue humalog 5 units with meals for cbg >=150 hgb is 6.7  5. Hypertension: her blood pressure is elevated will increase lisinopril to 20  mg daily will have nursing check b/p every shift .   6. Restless leg syndrome: will continue requip  3 mg nightly   7.  Depression with anxiety: will continue paxil 40 mg daily has ativan 0.5 mg three times daily as needed will monitor   8. ckd stage III:  Her bun/creat is 10/0.71; will monitor   9. Hypokalemia: will  increase k+ to 40 meq daily   10. Weight loss and loss of appetite: her current weight is 144.6 pounds. Will begin remeron 7.5 mg nightly for 30 days and will monitor her status; will place her on daily weights to ensure her weight is accurate.  Will check cmp in one week        Ok Edwards NP [facility]  Contact [phone] Monday through Friday 8am- 5pm  After hours call [phone]

## 2014-12-13 NOTE — Progress Notes (Signed)
Patient ID: [name], female   DOB: 1930/05/12, 81 y.o.   MRN: [id number]    Facility: [facility]       Allergies  Allergen Reactions  . Aspirin Hives  . Penicillins Hives    Chief Complaint  Patient presents with  . Medical Management of Chronic Issues    HPI:  She is a long term resident of this facility being seen for the management of her chronic illnesses. Her blood pressure is elevated with most readings from 160-180's/80-90s. She is unable to fully participate in the hpi or ros; she did tell me that she feels "ok".    Past Medical History  Diagnosis Date  . Hemorrhoid   . Anemia   . Arthritis   . Stroke ([place])   . Diabetes mellitus without complication ([place])   . Hypertension   . Chronic kidney disease   . Thyroid disease     Past Surgical History  Procedure Laterality Date  . Breast lumpectomy      LEFT  . Abdominal hysterectomy      PARTIAL  . Mastectomy  05/2008    Left    VITAL SIGNS BP 140/88 mmHg  Pulse 80  Ht 5\' 1"  (1.549 m)  Wt 162 lb (73.483 kg)  BMI 30.63 kg/m2  Patient's Medications  New Prescriptions   No medications on file  Previous Medications   ASCORBIC ACID (VITAMIN C) 1000 MG TABLET    Take 1,000 mg by mouth daily.   CHOLECALCIFEROL (VITAMIN D) 2000 UNITS TABLET    Take 2,000 Units by mouth daily.   CLOPIDOGREL (PLAVIX) 75 MG TABLET    Take 1 tablet (75 mg total) by mouth daily.   FERROUS SULFATE 325 (65 FE) MG TABLET    Take 325 mg by mouth 2 (two) times daily with a meal.   FOOD THICKENER (THICK IT) POWD    Take 1 Container by mouth as needed. Nectar thick   HYDROCODONE-ACETAMINOPHEN (NORCO) 10-325 MG PER TABLET    Take 1 tablet by mouth 2 (two) times daily.   INSULIN LISPRO (HUMALOG) 100 UNIT/ML INJECTION    Inject 5 Units into the skin 3 (three) times daily. Give 5 units for cbg >=150   LEVOTHYROXINE (SYNTHROID, LEVOTHROID) 112 MCG TABLET    Take 112 mcg by mouth daily before breakfast.   LISINOPRIL (PRINIVIL,ZESTRIL) 10 MG  TABLET    Take 5 mg by mouth daily.    LORAZEPAM (ATIVAN) 0.5 MG TABLET    Take 1 tablet (0.5 mg total) by mouth every 8 (eight) hours as needed for anxiety.   PAROXETINE (PAXIL) 10 MG TABLET    Take 30 mg by mouth daily.    ROPINIROLE (REQUIP) 3 MG TABLET    Take 3 mg by mouth at bedtime.    SACCHAROMYCES BOULARDII (FLORASTOR) 250 MG CAPSULE    Take 250 mg by mouth 2 (two) times daily.   VITAMIN B-12 1000 MCG TABLET    Take 1 tablet (1,000 mcg total) by mouth daily.  Modified Medications   No medications on file  Discontinued Medications     SIGNIFICANT DIAGNOSTIC EXAMS   09-19-14: left knee x-ray: Severe knee osteoarthritis with lateral and posterior tibial translation.  09-19-14: bilateral hip and pelvis x-ray: 1. Mild degenerative changes within both hips. 2. No acute abnormality. 3. Degenerative changes in the lumbar spine. 4. Atherosclerosis.   LABS REVIEWED:   07-05-14: hgb a1c 5.4 07-07-14: chol 10; ldl 55; trig 89 ;hdl 37 09-16-14: wbc 7.9;  hgb 7.1; hct 23.3; mcv 75.9; plt 338; glucose 90; bun 22; creat 0.94; k+3.2; na++135; liver normal albumin 2.3; urine culture: e-coli 09-17-14: wbc 7.4; hgb 7.7; hct 25.0; mcv 76.9; plt 319; glucose 09; bun ;23 creat 0.99; k+3.4; na++136; iron 13; tibc 113; tsh 6.520; vit b12: 1838; folate 10.8 09-19-14: glucose 115; bun 16; creat 0.79; k+4.4; na++136; free t4: 1.17; mag 1.4 09-20-14: wbc 6.2; hgb 9.1; hct 29.1; mcv 78.0; plt 319; glucose 108; bun 15; creat 0.82; k+4.1; na++134; liver normal albumin 2.3      Review of Systems  Unable to perform ROS: Other      Physical Exam  Constitutional: No distress.  Eyes: Conjunctivae are normal.  Neck: Neck supple. No JVD present. No thyromegaly present.  Cardiovascular: Normal rate, regular rhythm and intact distal pulses.   Respiratory: Effort normal and breath sounds normal. No respiratory distress. She has no wheezes.  GI: Soft. Bowel sounds are normal. She exhibits no distension. There is  no tenderness.  Musculoskeletal: She exhibits no edema.  Left hemiparesis    Lymphadenopathy:    She has no cervical adenopathy.  Neurological: She is alert.  Skin: Skin is warm and dry. She is not diaphoretic.  Has profon boots to both feet   Psychiatric: She has a normal mood and affect.     ASSESSMENT/ PLAN:  1. CVA: with left hemiparesis: is neurologically stable; will continue plavix 75 mg daily takes vicodin 10/325 mg twice daily for pain management and will monitor   2. Hypothyroidism: will continue synthroid 112 mcg daily and will monitor her free t4: 1.17; tsh 6.520  3. Anemia: is presently stable will continue iron twice daily; vit b12 1000 mcg daily  hgb 9.9; iron level is 13; will monitor  4. Diabetes: is stable will continue humalog 5 units with meals for cbg >=150 hgb is 5.4  5. Hypertension: her blood pressure is elevated will increase lisinopril to 10  mg daily will have nursing check b/p every shift and will check bmp in 2 weeks.   6. Restless leg syndrome: will continue requip  3 mg nightly   7.  Depression with anxiety: will continue paxil 30 mg daily has ativan 0.5 mg three times daily as needed will monitor   8. ckd stage III:  Her bun/creat is 14/0.79; will monitor        Ok Edwards NP [facility]  Contact [phone] Monday through Friday 8am- 5pm  After hours call [phone]

## 2014-12-14 DIAGNOSIS — I639 Cerebral infarction, unspecified: Secondary | ICD-10-CM | POA: Diagnosis not present

## 2014-12-14 DIAGNOSIS — E162 Hypoglycemia, unspecified: Secondary | ICD-10-CM | POA: Diagnosis not present

## 2014-12-14 DIAGNOSIS — E1122 Type 2 diabetes mellitus with diabetic chronic kidney disease: Secondary | ICD-10-CM | POA: Diagnosis not present

## 2014-12-14 DIAGNOSIS — M6281 Muscle weakness (generalized): Secondary | ICD-10-CM | POA: Diagnosis not present

## 2014-12-14 DIAGNOSIS — I69859 Hemiplegia and hemiparesis following other cerebrovascular disease affecting unspecified side: Secondary | ICD-10-CM | POA: Diagnosis not present

## 2014-12-14 DIAGNOSIS — G40501 Epileptic seizures related to external causes, not intractable, with status epilepticus: Secondary | ICD-10-CM | POA: Diagnosis not present

## 2014-12-15 DIAGNOSIS — E1122 Type 2 diabetes mellitus with diabetic chronic kidney disease: Secondary | ICD-10-CM | POA: Diagnosis not present

## 2014-12-15 DIAGNOSIS — I69859 Hemiplegia and hemiparesis following other cerebrovascular disease affecting unspecified side: Secondary | ICD-10-CM | POA: Diagnosis not present

## 2014-12-15 DIAGNOSIS — G40501 Epileptic seizures related to external causes, not intractable, with status epilepticus: Secondary | ICD-10-CM | POA: Diagnosis not present

## 2014-12-15 DIAGNOSIS — I639 Cerebral infarction, unspecified: Secondary | ICD-10-CM | POA: Diagnosis not present

## 2014-12-15 DIAGNOSIS — E162 Hypoglycemia, unspecified: Secondary | ICD-10-CM | POA: Diagnosis not present

## 2014-12-15 DIAGNOSIS — M6281 Muscle weakness (generalized): Secondary | ICD-10-CM | POA: Diagnosis not present

## 2014-12-17 DIAGNOSIS — G40501 Epileptic seizures related to external causes, not intractable, with status epilepticus: Secondary | ICD-10-CM | POA: Diagnosis not present

## 2014-12-17 DIAGNOSIS — E1122 Type 2 diabetes mellitus with diabetic chronic kidney disease: Secondary | ICD-10-CM | POA: Diagnosis not present

## 2014-12-17 DIAGNOSIS — I639 Cerebral infarction, unspecified: Secondary | ICD-10-CM | POA: Diagnosis not present

## 2014-12-17 DIAGNOSIS — M6281 Muscle weakness (generalized): Secondary | ICD-10-CM | POA: Diagnosis not present

## 2014-12-17 DIAGNOSIS — E162 Hypoglycemia, unspecified: Secondary | ICD-10-CM | POA: Diagnosis not present

## 2014-12-17 DIAGNOSIS — I69859 Hemiplegia and hemiparesis following other cerebrovascular disease affecting unspecified side: Secondary | ICD-10-CM | POA: Diagnosis not present

## 2014-12-18 DIAGNOSIS — I69859 Hemiplegia and hemiparesis following other cerebrovascular disease affecting unspecified side: Secondary | ICD-10-CM | POA: Diagnosis not present

## 2014-12-18 DIAGNOSIS — E1122 Type 2 diabetes mellitus with diabetic chronic kidney disease: Secondary | ICD-10-CM | POA: Diagnosis not present

## 2014-12-18 DIAGNOSIS — E162 Hypoglycemia, unspecified: Secondary | ICD-10-CM | POA: Diagnosis not present

## 2014-12-18 DIAGNOSIS — I639 Cerebral infarction, unspecified: Secondary | ICD-10-CM | POA: Diagnosis not present

## 2014-12-18 DIAGNOSIS — G40501 Epileptic seizures related to external causes, not intractable, with status epilepticus: Secondary | ICD-10-CM | POA: Diagnosis not present

## 2014-12-18 DIAGNOSIS — M6281 Muscle weakness (generalized): Secondary | ICD-10-CM | POA: Diagnosis not present

## 2014-12-19 DIAGNOSIS — F419 Anxiety disorder, unspecified: Secondary | ICD-10-CM | POA: Diagnosis not present

## 2014-12-19 DIAGNOSIS — I639 Cerebral infarction, unspecified: Secondary | ICD-10-CM | POA: Diagnosis not present

## 2014-12-19 DIAGNOSIS — F0391 Unspecified dementia with behavioral disturbance: Secondary | ICD-10-CM | POA: Diagnosis not present

## 2014-12-19 DIAGNOSIS — M6281 Muscle weakness (generalized): Secondary | ICD-10-CM | POA: Diagnosis not present

## 2014-12-19 DIAGNOSIS — G40501 Epileptic seizures related to external causes, not intractable, with status epilepticus: Secondary | ICD-10-CM | POA: Diagnosis not present

## 2014-12-19 DIAGNOSIS — E1122 Type 2 diabetes mellitus with diabetic chronic kidney disease: Secondary | ICD-10-CM | POA: Diagnosis not present

## 2014-12-19 DIAGNOSIS — I69859 Hemiplegia and hemiparesis following other cerebrovascular disease affecting unspecified side: Secondary | ICD-10-CM | POA: Diagnosis not present

## 2014-12-19 DIAGNOSIS — E162 Hypoglycemia, unspecified: Secondary | ICD-10-CM | POA: Diagnosis not present

## 2014-12-20 DIAGNOSIS — R489 Unspecified symbolic dysfunctions: Secondary | ICD-10-CM | POA: Diagnosis not present

## 2014-12-20 DIAGNOSIS — R1312 Dysphagia, oropharyngeal phase: Secondary | ICD-10-CM | POA: Diagnosis not present

## 2014-12-20 DIAGNOSIS — I639 Cerebral infarction, unspecified: Secondary | ICD-10-CM | POA: Diagnosis not present

## 2014-12-20 DIAGNOSIS — M6281 Muscle weakness (generalized): Secondary | ICD-10-CM | POA: Diagnosis not present

## 2014-12-20 DIAGNOSIS — I69859 Hemiplegia and hemiparesis following other cerebrovascular disease affecting unspecified side: Secondary | ICD-10-CM | POA: Diagnosis not present

## 2014-12-20 DIAGNOSIS — F339 Major depressive disorder, recurrent, unspecified: Secondary | ICD-10-CM | POA: Diagnosis not present

## 2014-12-20 DIAGNOSIS — G40501 Epileptic seizures related to external causes, not intractable, with status epilepticus: Secondary | ICD-10-CM | POA: Diagnosis not present

## 2014-12-20 DIAGNOSIS — E1122 Type 2 diabetes mellitus with diabetic chronic kidney disease: Secondary | ICD-10-CM | POA: Diagnosis not present

## 2014-12-20 DIAGNOSIS — E162 Hypoglycemia, unspecified: Secondary | ICD-10-CM | POA: Diagnosis not present

## 2014-12-21 DIAGNOSIS — I639 Cerebral infarction, unspecified: Secondary | ICD-10-CM | POA: Diagnosis not present

## 2014-12-21 DIAGNOSIS — M6281 Muscle weakness (generalized): Secondary | ICD-10-CM | POA: Diagnosis not present

## 2014-12-21 DIAGNOSIS — G40501 Epileptic seizures related to external causes, not intractable, with status epilepticus: Secondary | ICD-10-CM | POA: Diagnosis not present

## 2014-12-21 DIAGNOSIS — E1122 Type 2 diabetes mellitus with diabetic chronic kidney disease: Secondary | ICD-10-CM | POA: Diagnosis not present

## 2014-12-21 DIAGNOSIS — I69859 Hemiplegia and hemiparesis following other cerebrovascular disease affecting unspecified side: Secondary | ICD-10-CM | POA: Diagnosis not present

## 2014-12-21 DIAGNOSIS — E162 Hypoglycemia, unspecified: Secondary | ICD-10-CM | POA: Diagnosis not present

## 2014-12-23 DIAGNOSIS — I639 Cerebral infarction, unspecified: Secondary | ICD-10-CM | POA: Diagnosis not present

## 2014-12-23 DIAGNOSIS — E162 Hypoglycemia, unspecified: Secondary | ICD-10-CM | POA: Diagnosis not present

## 2014-12-23 DIAGNOSIS — M6281 Muscle weakness (generalized): Secondary | ICD-10-CM | POA: Diagnosis not present

## 2014-12-23 DIAGNOSIS — E1122 Type 2 diabetes mellitus with diabetic chronic kidney disease: Secondary | ICD-10-CM | POA: Diagnosis not present

## 2014-12-23 DIAGNOSIS — G40501 Epileptic seizures related to external causes, not intractable, with status epilepticus: Secondary | ICD-10-CM | POA: Diagnosis not present

## 2014-12-23 DIAGNOSIS — I69859 Hemiplegia and hemiparesis following other cerebrovascular disease affecting unspecified side: Secondary | ICD-10-CM | POA: Diagnosis not present

## 2014-12-24 ENCOUNTER — Non-Acute Institutional Stay (SKILLED_NURSING_FACILITY): Payer: Medicare Other | Admitting: Adult Health

## 2014-12-24 DIAGNOSIS — I69354 Hemiplegia and hemiparesis following cerebral infarction affecting left non-dominant side: Secondary | ICD-10-CM

## 2014-12-24 DIAGNOSIS — M6281 Muscle weakness (generalized): Secondary | ICD-10-CM | POA: Diagnosis not present

## 2014-12-24 DIAGNOSIS — N183 Chronic kidney disease, stage 3 unspecified: Secondary | ICD-10-CM

## 2014-12-24 DIAGNOSIS — R634 Abnormal weight loss: Secondary | ICD-10-CM | POA: Diagnosis not present

## 2014-12-24 DIAGNOSIS — N189 Chronic kidney disease, unspecified: Secondary | ICD-10-CM | POA: Diagnosis not present

## 2014-12-24 DIAGNOSIS — E034 Atrophy of thyroid (acquired): Secondary | ICD-10-CM | POA: Diagnosis not present

## 2014-12-24 DIAGNOSIS — I10 Essential (primary) hypertension: Secondary | ICD-10-CM | POA: Diagnosis not present

## 2014-12-24 DIAGNOSIS — F0390 Unspecified dementia without behavioral disturbance: Secondary | ICD-10-CM | POA: Diagnosis not present

## 2014-12-24 DIAGNOSIS — I6330 Cerebral infarction due to thrombosis of unspecified cerebral artery: Secondary | ICD-10-CM | POA: Diagnosis not present

## 2014-12-24 DIAGNOSIS — E1122 Type 2 diabetes mellitus with diabetic chronic kidney disease: Secondary | ICD-10-CM

## 2014-12-24 DIAGNOSIS — D631 Anemia in chronic kidney disease: Secondary | ICD-10-CM

## 2014-12-24 DIAGNOSIS — E876 Hypokalemia: Secondary | ICD-10-CM

## 2014-12-24 DIAGNOSIS — G40501 Epileptic seizures related to external causes, not intractable, with status epilepticus: Secondary | ICD-10-CM | POA: Diagnosis not present

## 2014-12-24 DIAGNOSIS — E162 Hypoglycemia, unspecified: Secondary | ICD-10-CM | POA: Diagnosis not present

## 2014-12-24 DIAGNOSIS — I69859 Hemiplegia and hemiparesis following other cerebrovascular disease affecting unspecified side: Secondary | ICD-10-CM | POA: Diagnosis not present

## 2014-12-24 DIAGNOSIS — I639 Cerebral infarction, unspecified: Secondary | ICD-10-CM | POA: Diagnosis not present

## 2014-12-24 DIAGNOSIS — E038 Other specified hypothyroidism: Secondary | ICD-10-CM

## 2014-12-24 DIAGNOSIS — Z794 Long term (current) use of insulin: Secondary | ICD-10-CM

## 2014-12-25 DIAGNOSIS — M6281 Muscle weakness (generalized): Secondary | ICD-10-CM | POA: Diagnosis not present

## 2014-12-25 DIAGNOSIS — I69859 Hemiplegia and hemiparesis following other cerebrovascular disease affecting unspecified side: Secondary | ICD-10-CM | POA: Diagnosis not present

## 2014-12-25 DIAGNOSIS — E1122 Type 2 diabetes mellitus with diabetic chronic kidney disease: Secondary | ICD-10-CM | POA: Diagnosis not present

## 2014-12-25 DIAGNOSIS — I639 Cerebral infarction, unspecified: Secondary | ICD-10-CM | POA: Diagnosis not present

## 2014-12-25 DIAGNOSIS — E162 Hypoglycemia, unspecified: Secondary | ICD-10-CM | POA: Diagnosis not present

## 2014-12-25 DIAGNOSIS — G40501 Epileptic seizures related to external causes, not intractable, with status epilepticus: Secondary | ICD-10-CM | POA: Diagnosis not present

## 2014-12-26 DIAGNOSIS — E162 Hypoglycemia, unspecified: Secondary | ICD-10-CM | POA: Diagnosis not present

## 2014-12-26 DIAGNOSIS — I69859 Hemiplegia and hemiparesis following other cerebrovascular disease affecting unspecified side: Secondary | ICD-10-CM | POA: Diagnosis not present

## 2014-12-26 DIAGNOSIS — M6281 Muscle weakness (generalized): Secondary | ICD-10-CM | POA: Diagnosis not present

## 2014-12-26 DIAGNOSIS — E1122 Type 2 diabetes mellitus with diabetic chronic kidney disease: Secondary | ICD-10-CM | POA: Diagnosis not present

## 2014-12-26 DIAGNOSIS — I639 Cerebral infarction, unspecified: Secondary | ICD-10-CM | POA: Diagnosis not present

## 2014-12-26 DIAGNOSIS — G40501 Epileptic seizures related to external causes, not intractable, with status epilepticus: Secondary | ICD-10-CM | POA: Diagnosis not present

## 2014-12-27 DIAGNOSIS — E162 Hypoglycemia, unspecified: Secondary | ICD-10-CM | POA: Diagnosis not present

## 2014-12-27 DIAGNOSIS — I69859 Hemiplegia and hemiparesis following other cerebrovascular disease affecting unspecified side: Secondary | ICD-10-CM | POA: Diagnosis not present

## 2014-12-27 DIAGNOSIS — G40501 Epileptic seizures related to external causes, not intractable, with status epilepticus: Secondary | ICD-10-CM | POA: Diagnosis not present

## 2014-12-27 DIAGNOSIS — M6281 Muscle weakness (generalized): Secondary | ICD-10-CM | POA: Diagnosis not present

## 2014-12-27 DIAGNOSIS — E1122 Type 2 diabetes mellitus with diabetic chronic kidney disease: Secondary | ICD-10-CM | POA: Diagnosis not present

## 2014-12-27 DIAGNOSIS — I639 Cerebral infarction, unspecified: Secondary | ICD-10-CM | POA: Diagnosis not present

## 2014-12-29 DIAGNOSIS — E162 Hypoglycemia, unspecified: Secondary | ICD-10-CM | POA: Diagnosis not present

## 2014-12-29 DIAGNOSIS — G40501 Epileptic seizures related to external causes, not intractable, with status epilepticus: Secondary | ICD-10-CM | POA: Diagnosis not present

## 2014-12-29 DIAGNOSIS — E1122 Type 2 diabetes mellitus with diabetic chronic kidney disease: Secondary | ICD-10-CM | POA: Diagnosis not present

## 2014-12-29 DIAGNOSIS — I639 Cerebral infarction, unspecified: Secondary | ICD-10-CM | POA: Diagnosis not present

## 2014-12-29 DIAGNOSIS — I69859 Hemiplegia and hemiparesis following other cerebrovascular disease affecting unspecified side: Secondary | ICD-10-CM | POA: Diagnosis not present

## 2014-12-29 DIAGNOSIS — M6281 Muscle weakness (generalized): Secondary | ICD-10-CM | POA: Diagnosis not present

## 2014-12-30 DIAGNOSIS — E162 Hypoglycemia, unspecified: Secondary | ICD-10-CM | POA: Diagnosis not present

## 2014-12-30 DIAGNOSIS — I639 Cerebral infarction, unspecified: Secondary | ICD-10-CM | POA: Diagnosis not present

## 2014-12-30 DIAGNOSIS — I69859 Hemiplegia and hemiparesis following other cerebrovascular disease affecting unspecified side: Secondary | ICD-10-CM | POA: Diagnosis not present

## 2014-12-30 DIAGNOSIS — G40501 Epileptic seizures related to external causes, not intractable, with status epilepticus: Secondary | ICD-10-CM | POA: Diagnosis not present

## 2014-12-30 DIAGNOSIS — E1122 Type 2 diabetes mellitus with diabetic chronic kidney disease: Secondary | ICD-10-CM | POA: Diagnosis not present

## 2014-12-30 DIAGNOSIS — M6281 Muscle weakness (generalized): Secondary | ICD-10-CM | POA: Diagnosis not present

## 2014-12-31 DIAGNOSIS — E162 Hypoglycemia, unspecified: Secondary | ICD-10-CM | POA: Diagnosis not present

## 2014-12-31 DIAGNOSIS — I639 Cerebral infarction, unspecified: Secondary | ICD-10-CM | POA: Diagnosis not present

## 2014-12-31 DIAGNOSIS — I69859 Hemiplegia and hemiparesis following other cerebrovascular disease affecting unspecified side: Secondary | ICD-10-CM | POA: Diagnosis not present

## 2014-12-31 DIAGNOSIS — M6281 Muscle weakness (generalized): Secondary | ICD-10-CM | POA: Diagnosis not present

## 2014-12-31 DIAGNOSIS — E1122 Type 2 diabetes mellitus with diabetic chronic kidney disease: Secondary | ICD-10-CM | POA: Diagnosis not present

## 2014-12-31 DIAGNOSIS — G40501 Epileptic seizures related to external causes, not intractable, with status epilepticus: Secondary | ICD-10-CM | POA: Diagnosis not present

## 2015-01-01 DIAGNOSIS — I69859 Hemiplegia and hemiparesis following other cerebrovascular disease affecting unspecified side: Secondary | ICD-10-CM | POA: Diagnosis not present

## 2015-01-01 DIAGNOSIS — M6281 Muscle weakness (generalized): Secondary | ICD-10-CM | POA: Diagnosis not present

## 2015-01-01 DIAGNOSIS — E162 Hypoglycemia, unspecified: Secondary | ICD-10-CM | POA: Diagnosis not present

## 2015-01-01 DIAGNOSIS — I639 Cerebral infarction, unspecified: Secondary | ICD-10-CM | POA: Diagnosis not present

## 2015-01-01 DIAGNOSIS — E1122 Type 2 diabetes mellitus with diabetic chronic kidney disease: Secondary | ICD-10-CM | POA: Diagnosis not present

## 2015-01-01 DIAGNOSIS — G40501 Epileptic seizures related to external causes, not intractable, with status epilepticus: Secondary | ICD-10-CM | POA: Diagnosis not present

## 2015-01-02 DIAGNOSIS — M6281 Muscle weakness (generalized): Secondary | ICD-10-CM | POA: Diagnosis not present

## 2015-01-02 DIAGNOSIS — I639 Cerebral infarction, unspecified: Secondary | ICD-10-CM | POA: Diagnosis not present

## 2015-01-02 DIAGNOSIS — I69859 Hemiplegia and hemiparesis following other cerebrovascular disease affecting unspecified side: Secondary | ICD-10-CM | POA: Diagnosis not present

## 2015-01-02 DIAGNOSIS — E1122 Type 2 diabetes mellitus with diabetic chronic kidney disease: Secondary | ICD-10-CM | POA: Diagnosis not present

## 2015-01-02 DIAGNOSIS — G40501 Epileptic seizures related to external causes, not intractable, with status epilepticus: Secondary | ICD-10-CM | POA: Diagnosis not present

## 2015-01-02 DIAGNOSIS — E162 Hypoglycemia, unspecified: Secondary | ICD-10-CM | POA: Diagnosis not present

## 2015-01-03 DIAGNOSIS — E1122 Type 2 diabetes mellitus with diabetic chronic kidney disease: Secondary | ICD-10-CM | POA: Diagnosis not present

## 2015-01-03 DIAGNOSIS — I639 Cerebral infarction, unspecified: Secondary | ICD-10-CM | POA: Diagnosis not present

## 2015-01-03 DIAGNOSIS — G40501 Epileptic seizures related to external causes, not intractable, with status epilepticus: Secondary | ICD-10-CM | POA: Diagnosis not present

## 2015-01-03 DIAGNOSIS — E162 Hypoglycemia, unspecified: Secondary | ICD-10-CM | POA: Diagnosis not present

## 2015-01-03 DIAGNOSIS — I69859 Hemiplegia and hemiparesis following other cerebrovascular disease affecting unspecified side: Secondary | ICD-10-CM | POA: Diagnosis not present

## 2015-01-03 DIAGNOSIS — M6281 Muscle weakness (generalized): Secondary | ICD-10-CM | POA: Diagnosis not present

## 2015-01-04 ENCOUNTER — Non-Acute Institutional Stay (SKILLED_NURSING_FACILITY): Payer: Medicare Other | Admitting: Adult Health

## 2015-01-04 DIAGNOSIS — B86 Scabies: Secondary | ICD-10-CM

## 2015-01-18 ENCOUNTER — Encounter: Payer: Self-pay | Admitting: Adult Health

## 2015-01-18 NOTE — Progress Notes (Signed)
Patient ID: [name], female   DOB: 1930/11/20, 81 y.o.   MRN: [id number]    Facility: [name]      Allergies  Allergen Reactions  . Aspirin Hives  . Penicillins Hives    Chief Complaint  Patient presents with  . Medical Management of Chronic Issues    HPI:  She is a long term resident of this facility being seen for the management of her chronic illnesses. There is little change in her status. Her weight is stable at 148.6 pounds she has completed her remeron therapy. Her weight in Oct 2016 was 157.8 pounds. She is unable to fully participate in the hpi or ros.    Past Medical History  Diagnosis Date  . Hemorrhoid   . Anemia   . Arthritis   . Stroke ([place])   . Diabetes mellitus without complication ([place])   . Hypertension   . Chronic kidney disease   . Thyroid disease     Past Surgical History  Procedure Laterality Date  . Breast lumpectomy      LEFT  . Abdominal hysterectomy      PARTIAL  . Mastectomy  05/2008    Left    VITAL SIGNS BP 186/90 mmHg  Pulse 88  Ht 5\' 1"  (1.549 m)  Wt 148 lb 9.6 oz (67.405 kg)  BMI 28.09 kg/m2  Patient's Medications  New Prescriptions   No medications on file  Previous Medications   ASCORBIC ACID (VITAMIN C) 1000 MG TABLET    Take 1,000 mg by mouth daily.   CHOLECALCIFEROL (VITAMIN D) 2000 UNITS TABLET    Take 2,000 Units by mouth daily.   CLOPIDOGREL (PLAVIX) 75 MG TABLET    Take 1 tablet (75 mg total) by mouth daily.   DONEPEZIL (ARICEPT) 5 MG TABLET    Take 10 mg by mouth at bedtime.    FERROUS SULFATE 325 (65 FE) MG TABLET    Take 325 mg by mouth 2 (two) times daily with a meal.   HYDROCODONE-ACETAMINOPHEN (NORCO) 10-325 MG PER TABLET    Take 1 tablet by mouth 2 (two) times daily.   INSULIN LISPRO (HUMALOG) 100 UNIT/ML INJECTION    Inject 5 Units into the skin 3 (three) times daily. Give 5 units for cbg >=150   LEVOTHYROXINE (SYNTHROID, LEVOTHROID) 112 MCG TABLET    Take 112 mcg by mouth daily before breakfast.     LISINOPRIL (PRINIVIL,ZESTRIL) 10 MG TABLET    Take 2 tablets (20 mg total) by mouth daily.   LORAZEPAM (ATIVAN) 0.5 MG TABLET    Take 1 tablet (0.5 mg total) by mouth every 8 (eight) hours as needed for anxiety.   PAROXETINE (PAXIL) 10 MG TABLET    Take 40 mg by mouth daily.    POTASSIUM CHLORIDE (KLOR-CON) 20 MEQ PACKET    Take 40 mEq by mouth daily. crystals   ROPINIROLE (REQUIP) 3 MG TABLET    Take 3 mg by mouth at bedtime.    SACCHAROMYCES BOULARDII (FLORASTOR) 250 MG CAPSULE    Take 250 mg by mouth 2 (two) times daily.   VITAMIN B-12 1000 MCG TABLET    Take 1 tablet (1,000 mcg total) by mouth daily.  Modified Medications   No medications on file  Discontinued Medications     SIGNIFICANT DIAGNOSTIC EXAMS   09-19-14: left knee x-ray: Severe knee osteoarthritis with lateral and posterior tibial translation.  09-19-14: bilateral hip and pelvis x-ray: 1. Mild degenerative changes within both hips. 2. No acute  abnormality. 3. Degenerative changes in the lumbar spine. 4. Atherosclerosis.   LABS REVIEWED:   07-05-14: hgb a1c 5.4 07-07-14: chol 10; ldl 55; trig 89 ;hdl 37 09-16-14: wbc 7.9; hgb 7.1; hct 23.3; mcv 75.9; plt 338; glucose 90; bun 22; creat 0.94; k+3.2; na++135; liver normal albumin 2.3; urine culture: e-coli 09-17-14: wbc 7.4; hgb 7.7; hct 25.0; mcv 76.9; plt 319; glucose 09; bun ;23 creat 0.99; k+3.4; na++136; iron 13; tibc 113; tsh 6.520; vit b12: 1838; folate 10.8 09-19-14: glucose 115; bun 16; creat 0.79; k+4.4; na++136; free t4: 1.17; mag 1.4 09-20-14: wbc 6.2; hgb 9.1; hct 29.1; mcv 78.0; plt 319; glucose 108; bun 15; creat 0.82; k+4.1; na++134; liver normal albumin 2.3  09-26-14: wbc 5.2; hgb 9.8; hct 31.7; mcv 76.4; plt 353; glucose 129; bun 14; creat 0.79; k+ 4.3; na++139; liver normal albumin 2.7 10-24-14: hgb a1c 6.7 11-05-14 glucose 126; bun 10; creat 0.71; k+ 3.4; na++142 11-08-14: wbc 8.7; hgb 9.0; hct 29.0; mcv 77.1; plt 295; iron 22; tibc 115  11-12-14: glucose  135; bun 9; creat 0.75; k+ 3.2; na++144      Review of Systems  Unable to perform ROS: Other      Physical Exam  Constitutional: No distress.  Eyes: Conjunctivae are normal.  Neck: Neck supple. No JVD present. No thyromegaly present.  Cardiovascular: Normal rate, regular rhythm and intact distal pulses.   Respiratory: Effort normal and breath sounds normal. No respiratory distress. She has no wheezes.  GI: Soft. Bowel sounds are normal. She exhibits no distension. There is no tenderness.  Musculoskeletal: She exhibits no edema.  Left hemiparesis    Lymphadenopathy:    She has no cervical adenopathy.  Neurological: She is alert.  Skin: Skin is warm and dry. She is not diaphoretic.  Has profon boots to both feet   Psychiatric: She has a normal mood and affect.     ASSESSMENT/ PLAN:  1. CVA: with left hemiparesis: is neurologically stable; will continue plavix 75 mg daily takes vicodin 10/325 mg twice daily for pain management and will monitor   2. Hypothyroidism: will continue synthroid 112 mcg daily and will monitor her free t4: 1.17; tsh 6.520  3. Anemia: is presently stable will continue iron twice daily; vit b12 1000 mcg daily  hgb 9.0; iron level is up to 22 from  13; will monitor  4. Diabetes: is stable will continue humalog 5 units with meals for cbg >=150 hgb is 6.7  5. Hypertension: her blood pressure is elevated will continue lisinopril  20  mg daily will begin lopressor 12.5 mg twice daily will have nursing check b/p every shift .   6. Restless leg syndrome: will continue requip  3 mg nightly   7.  Depression with anxiety: will continue paxil 40 mg daily has ativan 0.5 mg three times daily as needed will monitor   8. ckd stage III:  Her bun/creat is 10/0.71; will monitor   9. Hypokalemia: will continue k+  40 meq daily   10. Weight loss and loss of appetite: her current weight is 148.6 pounds.  will monitor her status;   11. Left breast cancer T1N1 ER/PR +,  HER 2 negative, 2 sentinel nodes involved:  post mastectomy with 3 node axillary evaluation and 5 years of [facility] thru 06-2013. No known active breast cancer Will monitor       Will check cbc; cmp     Ok Edwards NP [facility]  Contact [phone] Monday through Friday  8am- 5pm  After hours call [phone]

## 2015-01-19 ENCOUNTER — Encounter: Payer: Self-pay | Admitting: Adult Health

## 2015-01-19 DIAGNOSIS — B86 Scabies: Secondary | ICD-10-CM | POA: Insufficient documentation

## 2015-01-19 NOTE — Progress Notes (Signed)
Patient ID: [name], female   DOB: Jul 26, 1930, 81 y.o.   MRN: [id number]    Facility: [name]      Allergies  Allergen Reactions  . Aspirin Hives  . Penicillins Hives    Chief Complaint  Patient presents with  . Acute Visit    rash     HPI:  She has a rash on her right breast and bilateral axilla which has appearance of scabies. She is scratching. The staff states they have noticed the rash yesterday.  She is unable to fully participate in the hpi or ros.   Past Medical History  Diagnosis Date  . Hemorrhoid   . Anemia   . Arthritis   . Stroke ([place])   . Diabetes mellitus without complication ([place])   . Hypertension   . Chronic kidney disease   . Thyroid disease     Past Surgical History  Procedure Laterality Date  . Breast lumpectomy      LEFT  . Abdominal hysterectomy      PARTIAL  . Mastectomy  05/2008    Left    VITAL SIGNS BP 150/80 mmHg  Pulse 90  Ht 5\' 1"  (1.549 m)  Wt 148 lb (67.132 kg)  BMI 27.98 kg/m2  Patient's Medications  New Prescriptions   No medications on file  Previous Medications   ASCORBIC ACID (VITAMIN C) 1000 MG TABLET    Take 1,000 mg by mouth daily.   CHOLECALCIFEROL (VITAMIN D) 2000 UNITS TABLET    Take 2,000 Units by mouth daily.   CLOPIDOGREL (PLAVIX) 75 MG TABLET    Take 1 tablet (75 mg total) by mouth daily.   DONEPEZIL (ARICEPT) 5 MG TABLET    Take 10 mg by mouth at bedtime.    FERROUS SULFATE 325 (65 FE) MG TABLET    Take 325 mg by mouth 2 (two) times daily with a meal.   HYDROCODONE-ACETAMINOPHEN (NORCO) 10-325 MG PER TABLET    Take 1 tablet by mouth 2 (two) times daily.   INSULIN LISPRO (HUMALOG) 100 UNIT/ML INJECTION    Inject 5 Units into the skin 3 (three) times daily. Give 5 units for cbg >=150   LEVOTHYROXINE (SYNTHROID, LEVOTHROID) 112 MCG TABLET    Take 112 mcg by mouth daily before breakfast.   LISINOPRIL (PRINIVIL,ZESTRIL) 10 MG TABLET    Take 2 tablets (20 mg total) by mouth daily.   LORAZEPAM (ATIVAN)  0.5 MG TABLET    Take 1 tablet (0.5 mg total) by mouth every 8 (eight) hours as needed for anxiety.   PAROXETINE (PAXIL) 10 MG TABLET    Take 40 mg by mouth daily.    POTASSIUM CHLORIDE (KLOR-CON) 20 MEQ PACKET    Take 40 mEq by mouth daily. crystals   ROPINIROLE (REQUIP) 3 MG TABLET    Take 3 mg by mouth at bedtime.    SACCHAROMYCES BOULARDII (FLORASTOR) 250 MG CAPSULE    Take 250 mg by mouth 2 (two) times daily.   VITAMIN B-12 1000 MCG TABLET    Take 1 tablet (1,000 mcg total) by mouth daily.  Modified Medications   No medications on file  Discontinued Medications   No medications on file     SIGNIFICANT DIAGNOSTIC EXAMS    09-19-14: left knee x-ray: Severe knee osteoarthritis with lateral and posterior tibial translation.  09-19-14: bilateral hip and pelvis x-ray: 1. Mild degenerative changes within both hips. 2. No acute abnormality. 3. Degenerative changes in the lumbar spine. 4. Atherosclerosis.  LABS REVIEWED:   07-05-14: hgb a1c 5.4 07-07-14: chol 10; ldl 55; trig 89 ;hdl 37 09-16-14: wbc 7.9; hgb 7.1; hct 23.3; mcv 75.9; plt 338; glucose 90; bun 22; creat 0.94; k+3.2; na++135; liver normal albumin 2.3; urine culture: e-coli 09-17-14: wbc 7.4; hgb 7.7; hct 25.0; mcv 76.9; plt 319; glucose 09; bun ;23 creat 0.99; k+3.4; na++136; iron 13; tibc 113; tsh 6.520; vit b12: 1838; folate 10.8 09-19-14: glucose 115; bun 16; creat 0.79; k+4.4; na++136; free t4: 1.17; mag 1.4 09-20-14: wbc 6.2; hgb 9.1; hct 29.1; mcv 78.0; plt 319; glucose 108; bun 15; creat 0.82; k+4.1; na++134; liver normal albumin 2.3  09-26-14: wbc 5.2; hgb 9.8; hct 31.7; mcv 76.4; plt 353; glucose 129; bun 14; creat 0.79; k+ 4.3; na++139; liver normal albumin 2.7 10-24-14: hgb a1c 6.7 11-05-14 glucose 126; bun 10; creat 0.71; k+ 3.4; na++142 11-08-14: wbc 8.7; hgb 9.0; hct 29.0; mcv 77.1; plt 295; iron 22; tibc 115  11-12-14: glucose 135; bun 9; creat 0.75; k+ 3.2; na++144      Review of Systems  Unable to perform  ROS: Other      Physical Exam  Constitutional: No distress.  Eyes: Conjunctivae are normal.  Neck: Neck supple. No JVD present. No thyromegaly present.  Cardiovascular: Normal rate, regular rhythm and intact distal pulses.   Respiratory: Effort normal and breath sounds normal. No respiratory distress. She has no wheezes.  GI: Soft. Bowel sounds are normal. She exhibits no distension. There is no tenderness.  Musculoskeletal: She exhibits no edema.  Left hemiparesis    Lymphadenopathy:    She has no cervical adenopathy.  Neurological: She is alert.  Skin: Skin is warm and dry. She is not diaphoretic.  Has profon boots to both feet She has tracking rash to both axilla and right breast    Psychiatric: She has a normal mood and affect.      ASSESSMENT/ PLAN:  1. Scabies: will use permetherin cream per facility protocol for treatment and will monitor   Ok Edwards NP [facility]  Contact [phone] Monday through Friday 8am- 5pm  After hours call [phone]

## 2015-01-24 ENCOUNTER — Non-Acute Institutional Stay (SKILLED_NURSING_FACILITY): Payer: Medicare Other | Admitting: Adult Health

## 2015-01-24 DIAGNOSIS — R634 Abnormal weight loss: Secondary | ICD-10-CM | POA: Diagnosis not present

## 2015-01-24 DIAGNOSIS — F0390 Unspecified dementia without behavioral disturbance: Secondary | ICD-10-CM

## 2015-01-25 DIAGNOSIS — R634 Abnormal weight loss: Secondary | ICD-10-CM | POA: Diagnosis not present

## 2015-01-25 DIAGNOSIS — I10 Essential (primary) hypertension: Secondary | ICD-10-CM | POA: Diagnosis not present

## 2015-01-25 LAB — BASIC METABOLIC PANEL
BUN: 19 mg/dL (ref 4–21)
Creatinine: 1.4 mg/dL — AB (ref 0.5–1.1)
Glucose: 87 mg/dL
Potassium: 4.8 mmol/L (ref 3.4–5.3)
Sodium: 141 mmol/L (ref 137–147)

## 2015-01-25 LAB — HEPATIC FUNCTION PANEL
ALK PHOS: 79 U/L (ref 25–125)
ALT: 8 U/L (ref 7–35)
AST: 12 U/L — AB (ref 13–35)
BILIRUBIN, TOTAL: 0.4 mg/dL

## 2015-02-05 ENCOUNTER — Non-Acute Institutional Stay (SKILLED_NURSING_FACILITY): Payer: Medicare Other | Admitting: Internal Medicine

## 2015-02-05 ENCOUNTER — Encounter: Payer: Self-pay | Admitting: Internal Medicine

## 2015-02-05 DIAGNOSIS — I69354 Hemiplegia and hemiparesis following cerebral infarction affecting left non-dominant side: Secondary | ICD-10-CM | POA: Diagnosis not present

## 2015-02-05 DIAGNOSIS — Z794 Long term (current) use of insulin: Secondary | ICD-10-CM

## 2015-02-05 DIAGNOSIS — E038 Other specified hypothyroidism: Secondary | ICD-10-CM | POA: Diagnosis not present

## 2015-02-05 DIAGNOSIS — D631 Anemia in chronic kidney disease: Secondary | ICD-10-CM | POA: Diagnosis not present

## 2015-02-05 DIAGNOSIS — E1122 Type 2 diabetes mellitus with diabetic chronic kidney disease: Secondary | ICD-10-CM

## 2015-02-05 DIAGNOSIS — N183 Chronic kidney disease, stage 3 unspecified: Secondary | ICD-10-CM

## 2015-02-05 DIAGNOSIS — F418 Other specified anxiety disorders: Secondary | ICD-10-CM | POA: Diagnosis not present

## 2015-02-05 DIAGNOSIS — I10 Essential (primary) hypertension: Secondary | ICD-10-CM

## 2015-02-05 DIAGNOSIS — N189 Chronic kidney disease, unspecified: Secondary | ICD-10-CM | POA: Diagnosis not present

## 2015-02-05 DIAGNOSIS — E034 Atrophy of thyroid (acquired): Secondary | ICD-10-CM | POA: Diagnosis not present

## 2015-02-05 DIAGNOSIS — F0390 Unspecified dementia without behavioral disturbance: Secondary | ICD-10-CM

## 2015-02-18 NOTE — Progress Notes (Signed)
Patient ID: [name], female   DOB: 1930-12-16, 82 y.o.   MRN: [id number]    DATE: 02/05/15  Location:  [facility]    Place of Service: SNF (31)   Extended Emergency Contact Information Primary Emergency Contact: Piacentini,Eleanor Address: [place],  [place] Phone: [phone] Relation: Daughter Secondary Emergency Contact: Claudette Head [place] Phone: [phone] Mobile Phone: [phone] Relation: Sister  Advanced Directive information  FULL CODE  Chief Complaint  Patient presents with  . Medical Management of Chronic Issues    HPI:  82 yo female long term resident seen today for f/u. She is a poor historian due to dementia. Hx obtained from chart. She was treated for scabies last month. No recurrence. She c/o left leg weakness. No other concerns. No nursing issues  HX CVA - she has left hemiparesis. neurologically stable on plavix 75 mg daily;  vicodin 10/325 mg twice daily for pain management   Hypothyroidism - stable on synthroid 112 mcg daily. free t4: 1.17; tsh 6.520  Hx Anemia - likely due to CKD. stable on iron twice daily; vit b12 1000 mcg daily.  Hgb 9.0; iron level is up to 22 from 13  DM - stable on humalog 5 units with meals for cbg >=150. CBG 124 today. A1c 6.7%  Hypertension - BP stable on lisinopril  20  mg daily;  lopressor 12.5 mg twice daily   Restless leg syndrome - stable on requip  3 mg nightly   Depression with anxiety - mood stable on paxil 40 mg daily; has ativan 0.5 mg three times daily as needed for agitation  Hx CKD stage 3 - stable. Last Cr 1.45   Hypokalemia - stable on k+  40 meq daily. K+4.8  Weight loss and loss of appetite -  current weight is 148.7 pounds. Albumin 2.7  Hx Left breast cancer T1N1 ER/PR +, HER 2 negative, 2 sentinel nodes involved - s/p mastectomy with 3 node axillary evaluation and 5 years of [facility] thru  06-2013. No known active breast cancer   Past Medical History  Diagnosis Date  . Hemorrhoid   . Anemia   . Arthritis   . Stroke ([place])   . Diabetes mellitus without complication ([place])   . Hypertension   . Chronic kidney disease   . Thyroid disease     Past Surgical History  Procedure Laterality Date  . Breast lumpectomy      LEFT  . Abdominal hysterectomy      PARTIAL  . Mastectomy  05/2008    Left    Patient Care Team: Gildardo Cranker, DO as PCP - General (Internal Medicine) Doyce Loose, LCSW as Social Worker Gerlene Fee, NP as Nurse Practitioner (Geriatric Medicine) [place] ([place])  Social History   Social History  . Marital Status: Single    Spouse Name: N/A  . Number of Children: N/A  . Years of Education: N/A   Occupational History  . Not on file.   Social History Main Topics  . Smoking status: Never Smoker   . Smokeless tobacco: Not on file  . Alcohol Use: No  . Drug Use: No  . Sexual Activity: Not on file   Other Topics Concern  . Not on file   Social History Narrative     reports that she has never smoked. She  does not have any smokeless tobacco history on file. She reports that she does not drink alcohol or use illicit drugs. No family history on file. Unable to obtain due to dementia  Immunization History  Administered Date(s) Administered  . Influenza,inj,Quad PF,36+ Mos 10/07/2012, 10/13/2013  . Influenza-Unspecified 10/12/2014  . Pneumococcal Conjugate-13 10/14/2010    Allergies  Allergen Reactions  . Aspirin Hives  . Penicillins Hives    Medications: Patient's Medications  New Prescriptions   No medications on file  Previous Medications   ASCORBIC ACID (VITAMIN C) 1000 MG TABLET    Take 1,000 mg by mouth daily.   CHOLECALCIFEROL (VITAMIN D) 2000 UNITS TABLET    Take 2,000 Units by mouth daily.   CLOPIDOGREL (PLAVIX) 75 MG TABLET    Take 1 tablet (75 mg total) by mouth daily.    DONEPEZIL (ARICEPT) 5 MG TABLET    Take 5 mg by mouth at bedtime.    FERROUS SULFATE 325 (65 FE) MG TABLET    Take 325 mg by mouth 2 (two) times daily with a meal.   HYDROCODONE-ACETAMINOPHEN (NORCO) 10-325 MG PER TABLET    Take 1 tablet by mouth 2 (two) times daily.   INSULIN LISPRO (HUMALOG) 100 UNIT/ML INJECTION    Inject 5 Units into the skin 3 (three) times daily. Give 5 units for cbg >=150   LEVOTHYROXINE (SYNTHROID, LEVOTHROID) 112 MCG TABLET    Take 112 mcg by mouth daily before breakfast.   LISINOPRIL (PRINIVIL,ZESTRIL) 10 MG TABLET    Take 2 tablets (20 mg total) by mouth daily.   LORAZEPAM (ATIVAN) 0.5 MG TABLET    Take 1 tablet (0.5 mg total) by mouth every 8 (eight) hours as needed for anxiety.   MIRTAZAPINE (REMERON) 7.5 MG TABLET    Take 7.5 mg by mouth at bedtime.   PAROXETINE (PAXIL) 10 MG TABLET    Take 40 mg by mouth daily.    POTASSIUM CHLORIDE (KLOR-CON) 20 MEQ PACKET    Take 40 mEq by mouth daily. crystals   ROPINIROLE (REQUIP) 3 MG TABLET    Take 3 mg by mouth at bedtime.    SACCHAROMYCES BOULARDII (FLORASTOR) 250 MG CAPSULE    Take 250 mg by mouth 2 (two) times daily.   VITAMIN B-12 1000 MCG TABLET    Take 1 tablet (1,000 mcg total) by mouth daily.  Modified Medications   No medications on file  Discontinued Medications   No medications on file    Review of Systems  Unable to perform ROS: Dementia    Filed Vitals:   02/05/15 1431  BP: 149/69  Pulse: 70  Temp: 97 F (36.1 C)  Weight: 148 lb 11.2 oz (67.45 kg)  SpO2: 96%   Body mass index is 28.11 kg/(m^2).  Physical Exam  Constitutional: She appears well-developed.  Sitting in w/c in NAD, frail appearing  HENT:  Mouth/Throat: Oropharynx is clear and moist. No oropharyngeal exudate.  Eyes: Pupils are equal, round, and reactive to light. No scleral icterus.  Neck: Neck supple. Carotid bruit is not present. No tracheal deviation present. No thyromegaly present.  Cardiovascular: Normal rate, regular rhythm,  normal heart sounds and intact distal pulses.  Exam reveals no gallop and no friction rub.   No murmur heard. No LE edema b/l. no calf TTP.   Pulmonary/Chest: Effort normal and breath sounds normal. No stridor. No respiratory distress. She has no wheezes. She has no rales.  Abdominal: Soft. Bowel sounds are normal. She exhibits no distension  and no mass. There is no hepatomegaly. There is no tenderness. There is no rebound and no guarding.  Musculoskeletal: She exhibits edema.  (+) contractures  Lymphadenopathy:    She has no cervical adenopathy.  Neurological: She is alert. She displays tremor (resting).  Skin: Skin is warm and dry. No rash noted.  Psychiatric: She has a normal mood and affect. Her behavior is normal.     Labs reviewed: Appointment on 11/08/2014  Component Date Value Ref Range Status  . WBC 11/08/2014 8.7  3.9 - 10.3 10e3/uL Final  . NEUT# 11/08/2014 6.5  1.5 - 6.5 10e3/uL Final  . HGB 11/08/2014 9.0* 11.6 - 15.9 g/dL Final  . HCT 11/08/2014 29.0* 34.8 - 46.6 % Final  . Platelets 11/08/2014 295  145 - 400 10e3/uL Final  . MCV 11/08/2014 77.1* 79.5 - 101.0 fL Final  . MCH 11/08/2014 23.9* 25.1 - 34.0 pg Final  . MCHC 11/08/2014 31.0* 31.5 - 36.0 g/dL Final  . RBC 11/08/2014 3.76  3.70 - 5.45 10e6/uL Final  . RDW 11/08/2014 17.1* 11.2 - 14.5 % Final  . lymph# 11/08/2014 1.4  0.9 - 3.3 10e3/uL Final  . MONO# 11/08/2014 0.6  0.1 - 0.9 10e3/uL Final  . Eosinophils Absolute 11/08/2014 0.1  0.0 - 0.5 10e3/uL Final  . Basophils Absolute 11/08/2014 0.0  0.0 - 0.1 10e3/uL Final  . NEUT% 11/08/2014 75.2  38.4 - 76.8 % Final  . LYMPH% 11/08/2014 16.3  14.0 - 49.7 % Final  . MONO% 11/08/2014 7.1  0.0 - 14.0 % Final  . EOS% 11/08/2014 1.1  0.0 - 7.0 % Final  . BASO% 11/08/2014 0.3  0.0 - 2.0 % Final  . Iron 11/08/2014 22* 41 - 142 ug/dL Final  . TIBC 11/08/2014 115* 236 - 444 ug/dL Final  . UIBC 11/08/2014 93* 120 - 384 ug/dL Final  . %SAT 11/08/2014 19* 21 - 57 % Final     No results found.   Assessment/Plan   ICD-9-CM ICD-10-CM   1. Dementia without behavioral disturbance 294.20 F03.90   2. Hemiparesis affecting left side as late effect of cerebrovascular accident ([place]) 438.20 I69.354   3. Controlled type 2 diabetes mellitus with stage 3 chronic kidney disease, with long-term current use of insulin (HCC) 250.40 E11.22    585.3 N18.3    V58.67 Z79.4   4. Essential hypertension 401.9 I10   5. Hypothyroidism due to acquired atrophy of thyroid 244.8 E03.8    246.8 E03.4   6. Depression with anxiety 300.4 F41.8   7. Anemia in chronic renal disease 285.21 N18.9    585.9 D63.1     Check CMP  Cont current meds as ordered  Cont nutritional supplements as ordered  PT/OT/ST as indicated  Will follow  Lowana Hable S. Perlie Gold  [facility] and Adult Medicine [address] [place], [place] [address] [phone] Cell (Monday-Friday 8 AM - 5 PM) [phone] After 5 PM and follow prompts

## 2015-02-26 ENCOUNTER — Emergency Department (HOSPITAL_COMMUNITY): Payer: Medicare Other

## 2015-02-26 ENCOUNTER — Emergency Department (HOSPITAL_COMMUNITY)
Admission: EM | Admit: 2015-02-26 | Discharge: 2015-02-26 | Disposition: A | Discharge status: Home / Self Care | Payer: Medicare Other | Attending: Emergency Medicine | Admitting: Emergency Medicine

## 2015-02-26 ENCOUNTER — Encounter (HOSPITAL_COMMUNITY): Payer: Self-pay

## 2015-02-26 DIAGNOSIS — I129 Hypertensive chronic kidney disease with stage 1 through stage 4 chronic kidney disease, or unspecified chronic kidney disease: Secondary | ICD-10-CM | POA: Insufficient documentation

## 2015-02-26 DIAGNOSIS — R4182 Altered mental status, unspecified: Secondary | ICD-10-CM | POA: Diagnosis not present

## 2015-02-26 DIAGNOSIS — R410 Disorientation, unspecified: Secondary | ICD-10-CM | POA: Diagnosis not present

## 2015-02-26 DIAGNOSIS — M1990 Unspecified osteoarthritis, unspecified site: Secondary | ICD-10-CM | POA: Diagnosis not present

## 2015-02-26 DIAGNOSIS — Y92129 Unspecified place in nursing home as the place of occurrence of the external cause: Secondary | ICD-10-CM | POA: Insufficient documentation

## 2015-02-26 DIAGNOSIS — Z794 Long term (current) use of insulin: Secondary | ICD-10-CM | POA: Diagnosis not present

## 2015-02-26 DIAGNOSIS — F0390 Unspecified dementia without behavioral disturbance: Secondary | ICD-10-CM | POA: Diagnosis not present

## 2015-02-26 DIAGNOSIS — R1030 Lower abdominal pain, unspecified: Secondary | ICD-10-CM | POA: Diagnosis not present

## 2015-02-26 DIAGNOSIS — Y9389 Activity, other specified: Secondary | ICD-10-CM | POA: Diagnosis not present

## 2015-02-26 DIAGNOSIS — N189 Chronic kidney disease, unspecified: Secondary | ICD-10-CM | POA: Insufficient documentation

## 2015-02-26 DIAGNOSIS — S299XXA Unspecified injury of thorax, initial encounter: Secondary | ICD-10-CM | POA: Diagnosis present

## 2015-02-26 DIAGNOSIS — Y999 Unspecified external cause status: Secondary | ICD-10-CM | POA: Diagnosis not present

## 2015-02-26 DIAGNOSIS — D649 Anemia, unspecified: Secondary | ICD-10-CM | POA: Insufficient documentation

## 2015-02-26 DIAGNOSIS — W06XXXA Fall from bed, initial encounter: Secondary | ICD-10-CM | POA: Diagnosis not present

## 2015-02-26 DIAGNOSIS — Z7901 Long term (current) use of anticoagulants: Secondary | ICD-10-CM | POA: Insufficient documentation

## 2015-02-26 DIAGNOSIS — R918 Other nonspecific abnormal finding of lung field: Secondary | ICD-10-CM | POA: Diagnosis not present

## 2015-02-26 DIAGNOSIS — Z880 Allergy status to penicillin: Secondary | ICD-10-CM | POA: Diagnosis not present

## 2015-02-26 DIAGNOSIS — Z79899 Other long term (current) drug therapy: Secondary | ICD-10-CM | POA: Diagnosis not present

## 2015-02-26 DIAGNOSIS — R531 Weakness: Secondary | ICD-10-CM | POA: Diagnosis not present

## 2015-02-26 DIAGNOSIS — N390 Urinary tract infection, site not specified: Secondary | ICD-10-CM | POA: Diagnosis not present

## 2015-02-26 DIAGNOSIS — R404 Transient alteration of awareness: Secondary | ICD-10-CM | POA: Diagnosis not present

## 2015-02-26 DIAGNOSIS — E079 Disorder of thyroid, unspecified: Secondary | ICD-10-CM | POA: Insufficient documentation

## 2015-02-26 LAB — BASIC METABOLIC PANEL
Anion gap: 13 (ref 5–15)
BUN: 17 mg/dL (ref 6–20)
CHLORIDE: 103 mmol/L (ref 101–111)
CO2: 26 mmol/L (ref 22–32)
Calcium: 9.5 mg/dL (ref 8.9–10.3)
Creatinine, Ser: 1.38 mg/dL — ABNORMAL HIGH (ref 0.44–1.00)
GFR calc Af Amer: 40 mL/min — ABNORMAL LOW (ref >60)
GFR calc non Af Amer: 35 mL/min — ABNORMAL LOW (ref >60)
GLUCOSE: 110 mg/dL — AB (ref 65–99)
POTASSIUM: 3.9 mmol/L (ref 3.5–5.1)
Sodium: 142 mmol/L (ref 135–145)

## 2015-02-26 LAB — I-STAT TROPONIN, ED: Troponin i, poc: 0.01 ng/mL (ref 0.00–0.08)

## 2015-02-26 LAB — CBC WITH DIFFERENTIAL/PLATELET
Basophils Absolute: 0 10*3/uL (ref 0.0–0.1)
Basophils Relative: 0 %
EOS PCT: 1 %
Eosinophils Absolute: 0.1 10*3/uL (ref 0.0–0.7)
HCT: 35.5 % — ABNORMAL LOW (ref 36.0–46.0)
HEMOGLOBIN: 11.1 g/dL — AB (ref 12.0–15.0)
LYMPHS ABS: 2.1 10*3/uL (ref 0.7–4.0)
LYMPHS PCT: 25 %
MCH: 23.6 pg — AB (ref 26.0–34.0)
MCHC: 31.3 g/dL (ref 30.0–36.0)
MCV: 75.5 fL — AB (ref 78.0–100.0)
MONOS PCT: 9 %
Monocytes Absolute: 0.8 10*3/uL (ref 0.1–1.0)
Neutro Abs: 5.5 10*3/uL (ref 1.7–7.7)
Neutrophils Relative %: 65 %
PLATELETS: 219 10*3/uL (ref 150–400)
RBC: 4.7 MIL/uL (ref 3.87–5.11)
RDW: 14 % (ref 11.5–15.5)
WBC: 8.5 10*3/uL (ref 4.0–10.5)

## 2015-02-26 LAB — URINALYSIS, ROUTINE W REFLEX MICROSCOPIC
Bilirubin Urine: NEGATIVE
GLUCOSE, UA: NEGATIVE mg/dL
HGB URINE DIPSTICK: NEGATIVE
Ketones, ur: NEGATIVE mg/dL
Leukocytes, UA: NEGATIVE
Nitrite: NEGATIVE
Protein, ur: 100 mg/dL — AB
SPECIFIC GRAVITY, URINE: 1.014 (ref 1.005–1.030)
pH: 5.5 (ref 5.0–8.0)

## 2015-02-26 LAB — URINE MICROSCOPIC-ADD ON
BACTERIA UA: NONE SEEN
RBC / HPF: NONE SEEN RBC/hpf (ref 0–5)
Squamous Epithelial / LPF: NONE SEEN

## 2015-02-26 MED ORDER — SODIUM CHLORIDE 0.9 % IV BOLUS (SEPSIS)
1000.0000 mL | Freq: Once | INTRAVENOUS | Status: AC
  Administered 2015-02-26: 1000 mL via INTRAVENOUS

## 2015-02-26 MED ORDER — IOHEXOL 300 MG/ML  SOLN
50.0000 mL | Freq: Once | INTRAMUSCULAR | Status: AC | PRN
  Administered 2015-02-26: 50 mL via INTRAVENOUS

## 2015-02-26 MED ORDER — SODIUM CHLORIDE 0.9 % IV BOLUS (SEPSIS): 500.0000 mL | Freq: Once | INTRAVENOUS | Status: DC

## 2015-02-26 NOTE — ED Notes (Signed)
Called pickup and transportation for pt to [facility].

## 2015-02-26 NOTE — ED Notes (Signed)
[facility]- pt here from [facility] facility per family request to have urinalysis performed. Staff at facility reports no medical reason for transport. Vitals stable, pt alert to normal.

## 2015-02-26 NOTE — ED Provider Notes (Signed)
CSN: [id number]     Arrival date & time 02/26/15  [id number] History   First MD Initiated Contact with Patient 02/26/15 0818     Chief Complaint  Patient presents with  . Follow-up    HPI   [name] is a 82 y.o. female with a PMH of CVA, DM, HTN, CKD, breast cancer who presents to the ED for urinalysis. Per report, family was requesting patient be transferred to the ED for evaluation. Per facility, patient at baseline and has no medical reason for transport. Patient is a poor historian. Patient reports she feels more confused than usual and "hurts all over." She states her heart hurts and that she feels like she is in the way and points to her left chest. When asked about chest pain, she denies significant pain. Spoke with daughter on the phone, who states patient fell out of her bed this morning or last night and that her facility mentioned she seems more confused than usual and may have a UTI. [place], who states patient's bed was on the lowest setting (approximately 10 inches from the floor), and she attempted to crawl out of her bed several times. Her nurse states she did not fall, injure herself, hit her head, or lose consciousness.  Past Medical History  Diagnosis Date  . Hemorrhoid   . Anemia   . Arthritis   . Stroke ([place])   . Diabetes mellitus without complication ([place])   . Hypertension   . Chronic kidney disease   . Thyroid disease    Past Surgical History  Procedure Laterality Date  . Breast lumpectomy      LEFT  . Abdominal hysterectomy      PARTIAL  . Mastectomy  05/2008    Left   History reviewed. No pertinent family history. Social History  Substance Use Topics  . Smoking status: Never Smoker   . Smokeless tobacco: None  . Alcohol Use: No   OB History    No data available      Review of Systems  Unable to perform ROS: Dementia      Allergies  Aspirin and Penicillins  Home Medications   Prior to Admission medications   Medication  Sig Start Date End Date Taking? Authorizing Provider  Ascorbic Acid (VITAMIN C) 1000 MG tablet Take 1,000 mg by mouth daily.   Yes Historical Provider, MD  Cholecalciferol (VITAMIN D) 2000 UNITS tablet Take 2,000 Units by mouth daily.   Yes Historical Provider, MD  clopidogrel (PLAVIX) 75 MG tablet Take 1 tablet (75 mg total) by mouth daily. 07/09/14  Yes Jessica U Vann, DO  donepezil (ARICEPT) 5 MG tablet Take 5 mg by mouth at bedtime.    Yes Historical Provider, MD  ferrous sulfate 325 (65 FE) MG tablet Take 325 mg by mouth 2 (two) times daily with a meal.   Yes Historical Provider, MD  HYDROcodone-acetaminophen (NORCO) 10-325 MG per tablet Take 1 tablet by mouth 2 (two) times daily. 09/20/14  Yes Reyne Dumas, MD  insulin lispro (HUMALOG) 100 UNIT/ML injection Inject 5 Units into the skin 3 (three) times daily. Give 5 units for cbg >=150   Yes Historical Provider, MD  levothyroxine (SYNTHROID, LEVOTHROID) 112 MCG tablet Take 112 mcg by mouth daily before breakfast.   Yes Historical Provider, MD  lisinopril (PRINIVIL,ZESTRIL) 10 MG tablet Take 2 tablets (20 mg total) by mouth daily. 12/13/14  Yes Gerlene Fee, NP  LORazepam (ATIVAN) 0.5 MG tablet Take 1 tablet (0.5  mg total) by mouth every 8 (eight) hours as needed for anxiety. 09/20/14  Yes Reyne Dumas, MD  metoprolol tartrate (LOPRESSOR) 25 MG tablet Take 12.5 mg by mouth 2 (two) times daily.   Yes Historical Provider, MD  mirtazapine (REMERON) 7.5 MG tablet Take 7.5 mg by mouth at bedtime.   Yes Historical Provider, MD  PARoxetine (PAXIL) 10 MG tablet Take 40 mg by mouth daily.    Yes Historical Provider, MD  PARoxetine (PAXIL) 40 MG tablet Take 40 mg by mouth daily.   Yes Historical Provider, MD  potassium chloride (KLOR-CON) 20 MEQ packet Take 40 mEq by mouth daily. crystals 12/13/14  Yes Gerlene Fee, NP  rOPINIRole (REQUIP) 3 MG tablet Take 3 mg by mouth at bedtime.  10/20/10  Yes Historical Provider, MD  saccharomyces boulardii  (FLORASTOR) 250 MG capsule Take 250 mg by mouth 2 (two) times daily.   Yes Historical Provider, MD  vitamin B-12 1000 MCG tablet Take 1 tablet (1,000 mcg total) by mouth daily. 07/09/14  Yes Jessica U Vann, DO    BP 187/94 mmHg  Pulse 73  Temp(Src) 97.9 F (36.6 C) (Oral)  Resp 20  SpO2 100% Physical Exam  Constitutional: No distress.  Frail appearing female in no acute distress.  HENT:  Head: Normocephalic and atraumatic.  Right Ear: External ear normal.  Left Ear: External ear normal.  Nose: Nose normal.  Mouth/Throat: Uvula is midline, oropharynx is clear and moist and mucous membranes are normal.  Eyes: Conjunctivae, EOM and lids are normal. Pupils are equal, round, and reactive to light. Right eye exhibits no discharge. Left eye exhibits no discharge. No scleral icterus.  Neck: Normal range of motion. Neck supple.  Cardiovascular: Normal rate, regular rhythm, normal heart sounds, intact distal pulses and normal pulses.   Pulmonary/Chest: Effort normal and breath sounds normal. No respiratory distress. She has no wheezes. She has no rales. She exhibits no tenderness.  Abdominal: Soft. Normal appearance and bowel sounds are normal. She exhibits no distension and no mass. There is tenderness. There is no rigidity, no rebound and no guarding.  Mild TTP in suprapubic region.  Musculoskeletal: Normal range of motion. She exhibits no edema or tenderness.  No TTP of upper and lower extremities bilaterally.  Neurological: She is alert. No cranial nerve deficit or sensory deficit.  Oriented to person and place. Grip strength LUE 4/5. Strength with dorsiflexion and plantarflexion LLE 4/5. Tremor to upper extremities bilaterally. Per record review, this is patient's baseline.  Skin: Skin is warm, dry and intact. No rash noted. She is not diaphoretic. No erythema. No pallor.  Psychiatric: She has a normal mood and affect. Her speech is normal and behavior is normal.  Nursing note and vitals  reviewed.   ED Course  Procedures (including critical care time)  Labs Review Labs Reviewed  CBC WITH DIFFERENTIAL/PLATELET - Abnormal; Notable for the following:    Hemoglobin 11.1 (*)    HCT 35.5 (*)    MCV 75.5 (*)    MCH 23.6 (*)    All other components within normal limits  URINALYSIS, ROUTINE W REFLEX MICROSCOPIC (NOT AT [facility]) - Abnormal; Notable for the following:    Protein, ur 100 (*)    All other components within normal limits  BASIC METABOLIC PANEL - Abnormal; Notable for the following:    Glucose, Bld 110 (*)    Creatinine, Ser 1.38 (*)    GFR calc non Af Amer 35 (*)    GFR calc  Af Amer 40 (*)    All other components within normal limits  URINE MICROSCOPIC-ADD ON  Randolm Idol, ED    Imaging Review Dg Chest 2 View  02/26/2015  CLINICAL DATA:  Fusion and altered mental status. EXAM: CHEST  2 VIEW COMPARISON:  11/28/2012 FINDINGS: There is right hilar enlargement concerning for hilar lymphadenopathy or a hilar mass. There is no focal consolidation. There is no pleural effusion or pneumothorax. The heart and mediastinal contours are unremarkable. The osseous structures are unremarkable. IMPRESSION: Right hilar enlargement concerning for hilar lymphadenopathy or a hilar mass. Recommend further evaluation with a CT of the chest. Electronically Signed   By: Kathreen Devoid   On: 02/26/2015 09:25   Ct Head Wo Contrast  02/26/2015  CLINICAL DATA:  Patient with confusion.  No headache. EXAM: CT HEAD WITHOUT CONTRAST TECHNIQUE: Contiguous axial images were obtained from the base of the skull through the vertex without intravenous contrast. COMPARISON:  CT brain 07/05/2014; MR brain 07/05/2014 FINDINGS: Ventricles and sulci are prominent compatible with atrophy. Extensive periventricular and subcortical white matter hypodensity compatible with chronic microvascular ischemic changes. Bilateral chronic basal ganglia lacunar infarcts. Old left occipital and temporal lobe infarct.  Associated ex vacuo dilatation of the left lateral ventricle. No evidence for acute cortically based infarct, intracranial hemorrhage, mass lesion or mass-effect. Orbits are unremarkable. Scalp soft tissues are unremarkable. Calvarium is intact. Paranasal sinuses are well aerated. Mastoid air cells are unremarkable. IMPRESSION: No acute intracranial process. Re- demonstrated old left occipital and temporal lobe infarct. Atrophy and chronic microvascular ischemic changes. Chronic bilateral basal ganglia lacunar infarcts. Electronically Signed   By: Lovey Newcomer M.D.   On: 02/26/2015 12:45   Ct Chest W Contrast  02/26/2015  CLINICAL DATA:  82 year old with personal history of left lung cancer, presenting with acute mental status changes and confusion, abnormal chest x-ray earlier today questioning a right hilar mass or lymphadenopathy. EXAM: CT CHEST WITH CONTRAST TECHNIQUE: Multidetector CT imaging of the chest was performed during intravenous contrast administration. CONTRAST:  89mL OMNIPAQUE IOHEXOL 300 MG/ML IV. COMPARISON:  No prior CT. Chest x-rays earlier same day and previously. FINDINGS: Cardiovascular: Heart mildly to moderately enlarged with severe 3 vessel coronary atherosclerosis. Mild left ventricular hypertrophy. Mild to moderate left atrial enlargement. No pericardial effusion. Moderate atherosclerosis involving the thoracic aorta without evidence of aneurysm. Proximal great vessels tortuous and widely patent. Mediastinum/Lymph Nodes: No right hilar mass or lymphadenopathy as questioned on the earlier chest x-ray. The chest x-ray findings are due to a mildly dilated right upper lobe pulmonary vein superimposed upon the upper normal sized right interlobar pulmonary artery. No pathologic lymphadenopathy. Normal-appearing esophagus. Thyroid gland atrophic without nodularity. Lungs/Pleura: Chronic elevation of the right hemidiaphragm with chronic scar/atelectasis in the right lower lobe. Scarring medially  in the right lower lobe due to large bridging osteophytes arising from the right anterolateral T6-7, T7-8, T8-9 and T9-10 levels. No pulmonary parenchymal nodules or masses. No confluent airspace consolidation. No evidence of interstitial lung disease. No pleural effusions. Central airways patent without significant bronchial wall thickening. Upper abdomen: No acute abnormality. Musculoskeletal: Degenerative changes and DISH throughout the thoracic spine with exaggeration of the usual thoracic lordosis. IMPRESSION: 1. No evidence of right hilar mass or lymphadenopathy as questioned on the chest x-ray earlier today. The chest x-ray findings are due to a mildly dilated right upper lobe pulmonary vein superimposed upon an upper normal sized right interlobar pulmonary artery and adjacent medial right lung scarring. 2. Mild to moderate  cardiomegaly with severe 3 vessel coronary atherosclerosis. 3.  No acute cardiopulmonary disease. Electronically Signed   By: Evangeline Dakin M.D.   On: 02/26/2015 11:30     I have personally reviewed and evaluated these images and lab results as part of my medical decision-making.   EKG Interpretation None       MDM   Final diagnoses:  Confusion    82 year old female presents from living facility with confusion. She is a poor historian, however facility states she seemed more confused last night and attempted to crawl out of her bed several times today. Patient states her "whole heart hurts", though reports she feels she is a burden and is "always in the way." She denies chest pain.   Patient is afebrile. Vital signs stable. Heart RRR. Lungs clear to auscultation bilaterally. Mild TTP in suprapubic region. No rebound, guarding, or masses. No TTP of upper or lower extremities bilaterally. Patient alert and oriented to person and place. Patient has left sided weakness, which per record review is baseline. Otherwise, normal neuro exam with no focal deficit.  CBC negative  for leukocytosis, hemoglobin 11.1, which appears improved from prior. BMP remarkable for creatinine 1.38, which appears elevated from baseline. EKG sinus rhythm, HR 69. Troponin negative. CXR remarkable for hilar enlargement concerning for hilar lymphadenopathy or hilar mass. Recommend CT chest. Will hydrate and obtain CT chest. CT chest negative for right hilar mass or lymphadenopathy, CXR findings due to mildly dilated right upper lobe pulmonary vein superimposed on normal sized right pulmonary artery. Head CT negative for acute intracranial process.   On reassessment of patient, she is non-toxic and well-appearing and is more talkative than prior. Feel she is stable for discharge to her nursing facility at this time. Patient to follow-up with PCP. Spoke with patient's daughter on the phone regarding findings and plan, who verbalized her understanding and is in agreement. Patient discussed with and seen by Dr. Wilson Singer.  BP 187/94 mmHg  Pulse 73  Temp(Src) 97.9 F (36.6 C) (Oral)  Resp 20  SpO2 100%       Marella Chimes, PA-C 02/26/15 1404  Virgel Manifold, MD 02/27/15 [phone]

## 2015-02-26 NOTE — ED Notes (Signed)
Pt transporting to xray.  

## 2015-02-26 NOTE — ED Notes (Signed)
Providers comfortable with pt's blood pressure.

## 2015-02-26 NOTE — Discharge Instructions (Signed)
1. Medications: usual home medications 2. Treatment: rest, drink plenty of fluids 3. Follow Up: please followup with your primary doctor for discussion of your diagnoses and further evaluation after today's visit; please return to the ER for new or worsening symptoms   Confusion Confusion is the inability to think with your usual speed or clarity. Confusion may come on quickly or slowly over time. How quickly the confusion comes on depends on the cause. Confusion can be due to any number of causes. CAUSES   Concussion, head injury, or head trauma.  Seizures.  Stroke.  Fever.  Brain tumor.  Age related decreased brain function (dementia).  Heightened emotional states like rage or terror.  Mental illness in which the person loses the ability to determine what is real and what is not (hallucinations).  Infections such as a urinary tract infection (UTI).  Toxic effects from alcohol, drugs, or prescription medicines.  Dehydration and an imbalance of salts in the body (electrolytes).  Lack of sleep.  Low blood sugar (diabetes).  Low levels of oxygen from conditions such as chronic lung disorders.  Drug interactions or other medicine side effects.  Nutritional deficiencies, especially niacin, thiamine, vitamin C, or vitamin B.  Sudden drop in body temperature (hypothermia).  Change in routine, such as when traveling or hospitalized. SIGNS AND SYMPTOMS  People often describe their thinking as cloudy or unclear when they are confused. Confusion can also include feeling disoriented. That means you are unaware of where or who you are. You may also not know what the date or time is. If confused, you may also have difficulty paying attention, remembering, and making decisions. Some people also act aggressively when they are confused.  DIAGNOSIS  The medical evaluation of confusion may include:  Blood and urine tests.  X-rays.  Brain and nervous system tests.  Analyzing your  brain waves (electroencephalogram or EEG).  Magnetic resonance imaging (MRI) of your head.  Computed tomography (CT) scan of your head.  Mental status tests in which your health care provider may ask many questions. Some of these questions may seem silly or strange, but they are a very important test to help diagnose and treat confusion. TREATMENT  An admission to the hospital may not be needed, but a person with confusion should not be left alone. Stay with a family member or friend until the confusion clears. Avoid alcohol, pain relievers, or sedative drugs until you have fully recovered. Do not drive until directed by your health care provider. HOME CARE INSTRUCTIONS  What family and friends can do:  To find out if someone is confused, ask the person to state his or her name, age, and the date. If the person is unsure or answers incorrectly, he or she is confused.  Always introduce yourself, no matter how well the person knows you.  Often remind the person of his or her location.  Place a calendar and clock near the confused person.  Help the person with his or her medicines. You may want to use a pill box, an alarm as a reminder, or give the person each dose as prescribed.  Talk about current events and plans for the day.  Try to keep the environment calm, quiet, and peaceful.  Make sure the person keeps follow-up visits with his or her health care provider. PREVENTION  Ways to prevent confusion:  Avoid alcohol.  Eat a balanced diet.  Get enough sleep.  Take medicine only as directed by your health care provider.  Do not become isolated. Spend time with other people and make plans for your days.  Keep careful watch on your blood sugar levels if you are diabetic. SEEK IMMEDIATE MEDICAL CARE IF:   You develop severe headaches, repeated vomiting, seizures, blackouts, or slurred speech.  There is increasing confusion, weakness, numbness, restlessness, or personality  changes.  You develop a loss of balance, have marked dizziness, feel uncoordinated, or fall.  You have delusions, hallucinations, or develop severe anxiety.  Your family members think you need to be rechecked.   This information is not intended to replace advice given to you by your health care provider. Make sure you discuss any questions you have with your health care provider.   Document Released: 02/13/2004 Document Revised: 01/26/2014 Document Reviewed: 02/10/2013 Elsevier Interactive Patient Education [employer].

## 2015-03-07 ENCOUNTER — Encounter: Payer: Self-pay | Admitting: Adult Health

## 2015-03-07 ENCOUNTER — Non-Acute Institutional Stay (SKILLED_NURSING_FACILITY): Payer: Medicare Other | Admitting: Adult Health

## 2015-03-07 DIAGNOSIS — C50912 Malignant neoplasm of unspecified site of left female breast: Secondary | ICD-10-CM

## 2015-03-07 DIAGNOSIS — I6330 Cerebral infarction due to thrombosis of unspecified cerebral artery: Secondary | ICD-10-CM | POA: Diagnosis not present

## 2015-03-07 DIAGNOSIS — D631 Anemia in chronic kidney disease: Secondary | ICD-10-CM | POA: Diagnosis not present

## 2015-03-07 DIAGNOSIS — N183 Chronic kidney disease, stage 3 unspecified: Secondary | ICD-10-CM

## 2015-03-07 DIAGNOSIS — E1122 Type 2 diabetes mellitus with diabetic chronic kidney disease: Secondary | ICD-10-CM

## 2015-03-07 DIAGNOSIS — E034 Atrophy of thyroid (acquired): Secondary | ICD-10-CM

## 2015-03-07 DIAGNOSIS — I69354 Hemiplegia and hemiparesis following cerebral infarction affecting left non-dominant side: Secondary | ICD-10-CM | POA: Diagnosis not present

## 2015-03-07 DIAGNOSIS — Z794 Long term (current) use of insulin: Secondary | ICD-10-CM | POA: Diagnosis not present

## 2015-03-07 DIAGNOSIS — E038 Other specified hypothyroidism: Secondary | ICD-10-CM

## 2015-03-07 DIAGNOSIS — N189 Chronic kidney disease, unspecified: Secondary | ICD-10-CM | POA: Diagnosis not present

## 2015-03-07 DIAGNOSIS — F0390 Unspecified dementia without behavioral disturbance: Secondary | ICD-10-CM | POA: Diagnosis not present

## 2015-03-07 DIAGNOSIS — I10 Essential (primary) hypertension: Secondary | ICD-10-CM

## 2015-03-07 NOTE — Progress Notes (Signed)
Patient ID: [name], female   DOB: [birth year], 82 y.o.   MRN: [id number]   Facility: [name]       Allergies  Allergen Reactions  . Aspirin Hives  . Penicillins Hives     Chief Complaint  Patient presents with  . Medical Management of Chronic Issues    Follow up    HPI:  She is a long term resident of this facility being seen for the management of her chronic illnesses. She has gained weight from her remeron therapy. Her current weight is 154 pounds. She is unable to fully participate in the hpi or ros. There are no nursing concerns at this time   Past Medical History  Diagnosis Date  . Hemorrhoid   . Anemia   . Arthritis   . Stroke ([place])   . Diabetes mellitus without complication ([place])   . Hypertension   . Chronic kidney disease   . Thyroid disease     Past Surgical History  Procedure Laterality Date  . Breast lumpectomy      LEFT  . Abdominal hysterectomy      PARTIAL  . Mastectomy  05/2008    Left    VITAL SIGNS BP 126/72 mmHg  Pulse 74  Temp(Src) 97 F (36.1 C) (Oral)  Resp 18  Ht 5\' 1"  (1.549 m)  Wt 154 lb (69.854 kg)  BMI 29.11 kg/m2  SpO2 95%  Patient's Medications  New Prescriptions   No medications on file  Previous Medications   ASCORBIC ACID (VITAMIN C) 1000 MG TABLET    Take 1,000 mg by mouth daily.   CHOLECALCIFEROL (VITAMIN D) 2000 UNITS TABLET    Take 2,000 Units by mouth daily.   CLOPIDOGREL (PLAVIX) 75 MG TABLET    Take 1 tablet (75 mg total) by mouth daily.   DONEPEZIL (ARICEPT) 5 MG TABLET    Take 5 mg by mouth at bedtime.    FERROUS SULFATE 325 (65 FE) MG TABLET    Take 325 mg by mouth 2 (two) times daily with a meal.   HYDROCODONE-ACETAMINOPHEN (NORCO) 10-325 MG PER TABLET    Take 1 tablet by mouth 2 (two) times daily.   INSULIN LISPRO (HUMALOG) 100 UNIT/ML INJECTION    Inject 5 Units into the skin 3 (three) times daily. Give 5 units for cbg >=150   LEVOTHYROXINE (SYNTHROID, LEVOTHROID) 112 MCG TABLET    Take 112 mcg  by mouth daily before breakfast.   LISINOPRIL (PRINIVIL,ZESTRIL) 20 MG TABLET    Take 20 mg by mouth daily.   LORAZEPAM (ATIVAN) 0.5 MG TABLET    Take 1 tablet (0.5 mg total) by mouth every 8 (eight) hours as needed for anxiety.   METOPROLOL TARTRATE (LOPRESSOR) 25 MG TABLET    Take 12.5 mg by mouth 2 (two) times daily.   PAROXETINE (PAXIL) 10 MG TABLET    Take 40 mg by mouth daily.    POTASSIUM CHLORIDE (KLOR-CON) 20 MEQ PACKET    Take 40 mEq by mouth daily. crystals   ROPINIROLE (REQUIP) 3 MG TABLET    Take 3 mg by mouth at bedtime.    VITAMIN B-12 1000 MCG TABLET    Take 1 tablet (1,000 mcg total) by mouth daily.  Modified Medications   No medications on file  Discontinued Medications     SIGNIFICANT DIAGNOSTIC EXAMS  09-19-14: left knee x-ray: Severe knee osteoarthritis with lateral and posterior tibial translation.  09-19-14: bilateral hip and pelvis x-ray: 1. Mild degenerative changes within  both hips. 2. No acute abnormality. 3. Degenerative changes in the lumbar spine. 4. Atherosclerosis.   LABS REVIEWED:   07-05-14: hgb a1c 5.4 07-07-14: chol 10; ldl 55; trig 89 ;hdl 37 09-16-14: wbc 7.9; hgb 7.1; hct 23.3; mcv 75.9; plt 338; glucose 90; bun 22; creat 0.94; k+3.2; na++135; liver normal albumin 2.3; urine culture: e-coli 09-17-14: wbc 7.4; hgb 7.7; hct 25.0; mcv 76.9; plt 319; glucose 09; bun ;23 creat 0.99; k+3.4; na++136; iron 13; tibc 113; tsh 6.520; vit b12: 1838; folate 10.8 09-19-14: glucose 115; bun 16; creat 0.79; k+4.4; na++136; free t4: 1.17; mag 1.4 09-20-14: wbc 6.2; hgb 9.1; hct 29.1; mcv 78.0; plt 319; glucose 108; bun 15; creat 0.82; k+4.1; na++134; liver normal albumin 2.3  09-26-14: wbc 5.2; hgb 9.8; hct 31.7; mcv 76.4; plt 353; glucose 129; bun 14; creat 0.79; k+ 4.3; na++139; liver normal albumin 2.7 10-24-14: hgb a1c 6.7 11-05-14 glucose 126; bun 10; creat 0.71; k+ 3.4; na++142 11-08-14: wbc 8.7; hgb 9.0; hct 29.0; mcv 77.1; plt 295; iron 22; tibc 115  11-12-14:  glucose 135; bun 9; creat 0.75; k+ 3.2; na++144  01-25-15: glucose 87; bun 19; creat 1.45; k+ 4.8; na++141; liver normal albumin 2.7      Review of Systems  Unable to perform ROS: Other      Physical Exam  Constitutional: No distress.  Eyes: Conjunctivae are normal.  Neck: Neck supple. No JVD present. No thyromegaly present.  Cardiovascular: Normal rate, regular rhythm and intact distal pulses.   Respiratory: Effort normal and breath sounds normal. No respiratory distress. She has no wheezes.  GI: Soft. Bowel sounds are normal. She exhibits no distension. There is no tenderness.  Musculoskeletal: She exhibits no edema.  Left hemiparesis    Lymphadenopathy:    She has no cervical adenopathy.  Neurological: She is alert.  Skin: Skin is warm and dry. She is not diaphoretic.  Has profon boots to both feet   Psychiatric: She has a normal mood and affect.     ASSESSMENT/ PLAN:  1. CVA: with left hemiparesis: is neurologically stable; will continue plavix 75 mg daily takes vicodin 10/325 mg twice daily for pain management and will monitor   2. Hypothyroidism: will continue synthroid 112 mcg daily and will monitor her free t4: 1.17; tsh 6.520  3. Anemia: is presently stable will continue iron twice daily; vit b12 1000 mcg daily  hgb 9.0; iron level is up to 22 from  13; will monitor  4. Diabetes: is stable will continue humalog 5 units with meals for cbg >=150 hgb is 6.7  5. Hypertension: her blood pressure is elevated will continue lisinopril  20  mg daily will begin lopressor 12.5 mg twice daily will have nursing check b/p every shift .   6. Restless leg syndrome: will continue requip  3 mg nightly   7.  Depression with anxiety: will continue paxil 40 mg daily has ativan 0.5 mg three times daily as needed will monitor   8. ckd stage III:  Her bun/creat is 10/0.71; will monitor   9. Hypokalemia: will continue k+  40 meq daily   10. Weight loss and loss of appetite: her  current weight is 154 pounds.  will monitor her status; is off remeron will begin prostat 30 cc twice daily for albumin 2.7   11. Left breast cancer T1N1 ER/PR +, HER 2 negative, 2 sentinel nodes involved:  post mastectomy with 3 node axillary evaluation and 5 years of [facility] thru 06-2013.  No known active breast cancer Will monitor        Ok Edwards NP [facility]  Contact [phone] Monday through Friday 8am- 5pm  After hours call [phone]

## 2015-03-08 DIAGNOSIS — F419 Anxiety disorder, unspecified: Secondary | ICD-10-CM | POA: Diagnosis not present

## 2015-03-08 DIAGNOSIS — F0391 Unspecified dementia with behavioral disturbance: Secondary | ICD-10-CM | POA: Diagnosis not present

## 2015-03-12 ENCOUNTER — Encounter: Payer: Self-pay | Admitting: Adult Health

## 2015-03-12 NOTE — Progress Notes (Signed)
Patient ID: [name], female   DOB: May 04, 1930, 82 y.o.   MRN: [id number]   Facility: [name]       Allergies  Allergen Reactions  . Aspirin Hives  . Penicillins Hives    Chief Complaint  Patient presents with  . Acute Visit    weight loss     HPI:  I have been asked to see for weight loss. Her weight on 09-24-14: 163 pounds; on 11-23-14 weight 148.6; pounds; on 12-31-14 145.6 pounds; and current weight is 143 pounds. She is unable to fully participate in the hpi or ros. Staff report that her appetite is poor.    Past Medical History  Diagnosis Date  . Hemorrhoid   . Anemia   . Arthritis   . Stroke ([place])   . Diabetes mellitus without complication ([place])   . Hypertension   . Chronic kidney disease   . Thyroid disease     Past Surgical History  Procedure Laterality Date  . Breast lumpectomy      LEFT  . Abdominal hysterectomy      PARTIAL  . Mastectomy  05/2008    Left    VITAL SIGNS BP 150/78 mmHg  Pulse 68  Ht 5\' 1"  (1.549 m)  Wt 143 lb (64.864 kg)  BMI 27.03 kg/m2  SpO2 96%  Patient's Medications  New Prescriptions   No medications on file  Previous Medications   ASCORBIC ACID (VITAMIN C) 1000 MG TABLET    Take 1,000 mg by mouth daily.   CHOLECALCIFEROL (VITAMIN D) 2000 UNITS TABLET    Take 2,000 Units by mouth daily.   CLOPIDOGREL (PLAVIX) 75 MG TABLET    Take 1 tablet (75 mg total) by mouth daily.   DONEPEZIL (ARICEPT) 5 MG TABLET    Take 10 mg by mouth at bedtime.    FERROUS SULFATE 325 (65 FE) MG TABLET    Take 325 mg by mouth 2 (two) times daily with a meal.   HYDROCODONE-ACETAMINOPHEN (NORCO) 10-325 MG PER TABLET    Take 1 tablet by mouth 2 (two) times daily.   INSULIN LISPRO (HUMALOG) 100 UNIT/ML INJECTION    Inject 5 Units into the skin 3 (three) times daily. Give 5 units for cbg >=150   LEVOTHYROXINE (SYNTHROID, LEVOTHROID) 112 MCG TABLET    Take 112 mcg by mouth daily before breakfast.   LISINOPRIL (PRINIVIL,ZESTRIL) 20 MG TABLET     Take 20 mg by mouth daily.   LORAZEPAM (ATIVAN) 0.5 MG TABLET    Take 1 tablet (0.5 mg total) by mouth every 8 (eight) hours as needed for anxiety.   METOPROLOL TARTRATE (LOPRESSOR) 25 MG TABLET    Take 12.5 mg by mouth 2 (two) times daily.   PAROXETINE (PAXIL) 10 MG TABLET    Take 40 mg by mouth daily.    POTASSIUM CHLORIDE (KLOR-CON) 20 MEQ PACKET    Take 40 mEq by mouth daily. crystals   ROPINIROLE (REQUIP) 3 MG TABLET    Take 3 mg by mouth at bedtime.    VITAMIN B-12 1000 MCG TABLET    Take 1 tablet (1,000 mcg total) by mouth daily.  Modified Medications   No medications on file  Discontinued Medications   No medications on file     SIGNIFICANT DIAGNOSTIC EXAMS  09-19-14: left knee x-ray: Severe knee osteoarthritis with lateral and posterior tibial translation.  09-19-14: bilateral hip and pelvis x-ray: 1. Mild degenerative changes within both hips. 2. No acute abnormality. 3. Degenerative changes  in the lumbar spine. 4. Atherosclerosis.   LABS REVIEWED:   07-05-14: hgb a1c 5.4 07-07-14: chol 10; ldl 55; trig 89 ;hdl 37 09-16-14: wbc 7.9; hgb 7.1; hct 23.3; mcv 75.9; plt 338; glucose 90; bun 22; creat 0.94; k+3.2; na++135; liver normal albumin 2.3; urine culture: e-coli 09-17-14: wbc 7.4; hgb 7.7; hct 25.0; mcv 76.9; plt 319; glucose 09; bun ;23 creat 0.99; k+3.4; na++136; iron 13; tibc 113; tsh 6.520; vit b12: 1838; folate 10.8 09-19-14: glucose 115; bun 16; creat 0.79; k+4.4; na++136; free t4: 1.17; mag 1.4 09-20-14: wbc 6.2; hgb 9.1; hct 29.1; mcv 78.0; plt 319; glucose 108; bun 15; creat 0.82; k+4.1; na++134; liver normal albumin 2.3  09-26-14: wbc 5.2; hgb 9.8; hct 31.7; mcv 76.4; plt 353; glucose 129; bun 14; creat 0.79; k+ 4.3; na++139; liver normal albumin 2.7 10-24-14: hgb a1c 6.7 11-05-14 glucose 126; bun 10; creat 0.71; k+ 3.4; na++142 11-08-14: wbc 8.7; hgb 9.0; hct 29.0; mcv 77.1; plt 295; iron 22; tibc 115  11-12-14: glucose 135; bun 9; creat 0.75; k+ 3.2; na++144       Review of Systems  Unable to perform ROS: Other      Physical Exam  Constitutional: No distress.  Eyes: Conjunctivae are normal.  Neck: Neck supple. No JVD present. No thyromegaly present.  Cardiovascular: Normal rate, regular rhythm and intact distal pulses.   Respiratory: Effort normal and breath sounds normal. No respiratory distress. She has no wheezes.  GI: Soft. Bowel sounds are normal. She exhibits no distension. There is no tenderness.  Musculoskeletal: She exhibits no edema.  Left hemiparesis    Lymphadenopathy:    She has no cervical adenopathy.  Neurological: She is alert.  Skin: Skin is warm and dry. She is not diaphoretic.  Has profon boots to both feet    Psychiatric: She has a normal mood and affect.     ASSESSMENT/ PLAN:  1. Weight loss 2. Dementia without behavioral disturbance  Will lower her aricept 5 mg nightly to help improve appetite Will begin remeron 7.5 mg nightly for 30 days Will check cmp       Ok Edwards NP [facility]  Contact [phone] Monday through Friday 8am- 5pm  After hours call [phone]

## 2015-03-24 LAB — BASIC METABOLIC PANEL: GLUCOSE: 184 mg/dL

## 2015-03-25 ENCOUNTER — Non-Acute Institutional Stay (SKILLED_NURSING_FACILITY): Payer: Medicare Other | Admitting: Adult Health

## 2015-03-25 ENCOUNTER — Encounter: Payer: Self-pay | Admitting: Adult Health

## 2015-03-25 DIAGNOSIS — R0689 Other abnormalities of breathing: Secondary | ICD-10-CM | POA: Diagnosis not present

## 2015-03-25 DIAGNOSIS — T17998S Other foreign object in respiratory tract, part unspecified causing other injury, sequela: Secondary | ICD-10-CM | POA: Diagnosis not present

## 2015-03-25 DIAGNOSIS — T17908S Unspecified foreign body in respiratory tract, part unspecified causing other injury, sequela: Secondary | ICD-10-CM

## 2015-03-25 NOTE — Progress Notes (Signed)
Patient ID: [name], female   DOB: Oct 14, 1930, 82 y.o.   MRN: [id number]   Facility: [name]       Allergies  Allergen Reactions  . Aspirin Hives  . Penicillins Hives    Chief Complaint  Patient presents with  . Acute Visit    HPI:  Staff reports that she may aspirated at breakfast this morning. She is coughing. She is a poor historian and is unable to fully participate in the hpi or ros. There are no signs of distress present.   Past Medical History  Diagnosis Date  . Hemorrhoid   . Anemia   . Arthritis   . Stroke ([place])   . Diabetes mellitus without complication ([place])   . Hypertension   . Chronic kidney disease   . Thyroid disease     Past Surgical History  Procedure Laterality Date  . Breast lumpectomy      LEFT  . Abdominal hysterectomy      PARTIAL  . Mastectomy  05/2008    Left    VITAL SIGNS BP 156/88 mmHg  Pulse 74  Temp(Src) 97.6 F (36.4 C) (Oral)  Resp 18  Ht 5\' 1"  (1.549 m)  Wt 150 lb 4 oz (68.153 kg)  BMI 28.40 kg/m2  SpO2 95%  Patient's Medications  New Prescriptions   No medications on file  Previous Medications   ASCORBIC ACID (VITAMIN C) 1000 MG TABLET    Take 1,000 mg by mouth daily.   CHOLECALCIFEROL (VITAMIN D) 2000 UNITS TABLET    Take 2,000 Units by mouth daily.   CLOPIDOGREL (PLAVIX) 75 MG TABLET    Take 1 tablet (75 mg total) by mouth daily.   DONEPEZIL (ARICEPT) 5 MG TABLET    Take 5 mg by mouth at bedtime.    FERROUS SULFATE 325 (65 FE) MG TABLET    Take 325 mg by mouth 2 (two) times daily with a meal.   HYDROCODONE-ACETAMINOPHEN (NORCO) 10-325 MG PER TABLET    Take 1 tablet by mouth 2 (two) times daily.   INSULIN LISPRO (HUMALOG) 100 UNIT/ML INJECTION    Inject 5 Units into the skin 3 (three) times daily. Give 5 units for cbg >=150   LEVOTHYROXINE (SYNTHROID, LEVOTHROID) 112 MCG TABLET    Take 112 mcg by mouth daily before breakfast.   LISINOPRIL (PRINIVIL,ZESTRIL) 20 MG TABLET    Take 20 mg by mouth daily.   LORAZEPAM (ATIVAN) 0.5 MG TABLET    Take 1 tablet (0.5 mg total) by mouth every 8 (eight) hours as needed for anxiety.   METOPROLOL TARTRATE (LOPRESSOR) 25 MG TABLET    Take 12.5 mg by mouth 2 (two) times daily.   PAROXETINE (PAXIL) 10 MG TABLET    Take 40 mg by mouth daily.    POLLEN EXTRACTS (PROSTAT PO)    Take by mouth. 30 cc by mouth BID for albumin 2.7   POTASSIUM CHLORIDE (KLOR-CON) 20 MEQ PACKET    Take 40 mEq by mouth daily. crystals   ROPINIROLE (REQUIP) 3 MG TABLET    Take 3 mg by mouth at bedtime.    VITAMIN B-12 1000 MCG TABLET    Take 1 tablet (1,000 mcg total) by mouth daily.  Modified Medications   No medications on file  Discontinued Medications   No medications on file     SIGNIFICANT DIAGNOSTIC EXAMS  09-19-14: left knee x-ray: Severe knee osteoarthritis with lateral and posterior tibial translation.  09-19-14: bilateral hip and pelvis x-ray: 1. Mild  degenerative changes within both hips. 2. No acute abnormality. 3. Degenerative changes in the lumbar spine. 4. Atherosclerosis.  03-25-15: chest x-ray: no acute cardiopulmonary disease process; no pneumonia.    LABS REVIEWED:   07-05-14: hgb a1c 5.4 07-07-14: chol 10; ldl 55; trig 89 ;hdl 37 09-16-14: wbc 7.9; hgb 7.1; hct 23.3; mcv 75.9; plt 338; glucose 90; bun 22; creat 0.94; k+3.2; na++135; liver normal albumin 2.3; urine culture: e-coli 09-17-14: wbc 7.4; hgb 7.7; hct 25.0; mcv 76.9; plt 319; glucose 09; bun ;23 creat 0.99; k+3.4; na++136; iron 13; tibc 113; tsh 6.520; vit b12: 1838; folate 10.8 09-19-14: glucose 115; bun 16; creat 0.79; k+4.4; na++136; free t4: 1.17; mag 1.4 09-20-14: wbc 6.2; hgb 9.1; hct 29.1; mcv 78.0; plt 319; glucose 108; bun 15; creat 0.82; k+4.1; na++134; liver normal albumin 2.3  09-26-14: wbc 5.2; hgb 9.8; hct 31.7; mcv 76.4; plt 353; glucose 129; bun 14; creat 0.79; k+ 4.3; na++139; liver normal albumin 2.7 10-24-14: hgb a1c 6.7 11-05-14 glucose 126; bun 10; creat 0.71; k+ 3.4;  na++142 11-08-14: wbc 8.7; hgb 9.0; hct 29.0; mcv 77.1; plt 295; iron 22; tibc 115  11-12-14: glucose 135; bun 9; creat 0.75; k+ 3.2; na++144  01-25-15: glucose 87; bun 19; creat 1.45; k+ 4.8; na++141; liver normal albumin 2.7      Review of Systems  Unable to perform ROS: Other      Physical Exam  Constitutional: No distress.  Eyes: Conjunctivae are normal.  Neck: Neck supple. No JVD present. No thyromegaly present.  Cardiovascular: Normal rate, regular rhythm and intact distal pulses.   Respiratory: Effort normal and breath sounds normal. No respiratory distress. She has no wheezes.  GI: Soft. Bowel sounds are normal. She exhibits no distension. There is no tenderness.  Musculoskeletal: She exhibits no edema.  Left hemiparesis    Lymphadenopathy:    She has no cervical adenopathy.  Neurological: She is alert.  Skin: Skin is warm and dry. She is not diaphoretic.  Has profon boots to both feet   Psychiatric: She has a normal mood and affect.     ASSESSMENT/ PLAN:  Question aspiration:her x-ray does not demonstrate any disease process. Will continue to monitor her status and will treat issues as they arise.         Ok Edwards NP [facility]  Contact [phone] Monday through Friday 8am- 5pm  After hours call [phone]

## 2015-04-04 ENCOUNTER — Non-Acute Institutional Stay (SKILLED_NURSING_FACILITY): Payer: Medicare Other | Admitting: Adult Health

## 2015-04-04 ENCOUNTER — Encounter: Payer: Self-pay | Admitting: Adult Health

## 2015-04-04 DIAGNOSIS — E034 Atrophy of thyroid (acquired): Secondary | ICD-10-CM | POA: Diagnosis not present

## 2015-04-04 DIAGNOSIS — I10 Essential (primary) hypertension: Secondary | ICD-10-CM

## 2015-04-04 DIAGNOSIS — Z794 Long term (current) use of insulin: Secondary | ICD-10-CM

## 2015-04-04 DIAGNOSIS — N183 Chronic kidney disease, stage 3 unspecified: Secondary | ICD-10-CM

## 2015-04-04 DIAGNOSIS — F0390 Unspecified dementia without behavioral disturbance: Secondary | ICD-10-CM | POA: Diagnosis not present

## 2015-04-04 DIAGNOSIS — I69354 Hemiplegia and hemiparesis following cerebral infarction affecting left non-dominant side: Secondary | ICD-10-CM

## 2015-04-04 DIAGNOSIS — C50912 Malignant neoplasm of unspecified site of left female breast: Secondary | ICD-10-CM | POA: Diagnosis not present

## 2015-04-04 DIAGNOSIS — E1122 Type 2 diabetes mellitus with diabetic chronic kidney disease: Secondary | ICD-10-CM | POA: Diagnosis not present

## 2015-04-04 DIAGNOSIS — I6330 Cerebral infarction due to thrombosis of unspecified cerebral artery: Secondary | ICD-10-CM

## 2015-04-04 DIAGNOSIS — E038 Other specified hypothyroidism: Secondary | ICD-10-CM

## 2015-04-04 NOTE — Progress Notes (Signed)
Patient ID: [name], female   DOB: September 12, 1930, 82 y.o.   MRN: [id number]   Facility[name]       Allergies  Allergen Reactions  . Aspirin Hives  . Penicillins Hives    Chief Complaint  Patient presents with  . Medical Management of Chronic Issues    Follow up    HPI:  She is a long term resident of this facility being seen for the management of her chronic illnesses.  Overall her status is stable. She is unable to fully participate in the hpi or ros. There are no nursing concerns at this time.    Past Medical History  Diagnosis Date  . Hemorrhoid   . Anemia   . Arthritis   . Stroke ([place])   . Diabetes mellitus without complication ([place])   . Hypertension   . Chronic kidney disease   . Thyroid disease     Past Surgical History  Procedure Laterality Date  . Breast lumpectomy      LEFT  . Abdominal hysterectomy      PARTIAL  . Mastectomy  05/2008    Left    VITAL SIGNS BP 146/88 mmHg  Pulse 61  Temp(Src) 97.7 F (36.5 C) (Oral)  Resp 18  Ht 5\' 1"  (1.549 m)  Wt 149 lb (67.586 kg)  BMI 28.17 kg/m2  SpO2 95%  Patient's Medications  New Prescriptions   No medications on file  Previous Medications   ASCORBIC ACID (VITAMIN C) 1000 MG TABLET    Take 1,000 mg by mouth daily.   CHOLECALCIFEROL (VITAMIN D) 2000 UNITS TABLET    Take 2,000 Units by mouth daily.   CLOPIDOGREL (PLAVIX) 75 MG TABLET    Take 1 tablet (75 mg total) by mouth daily.   DONEPEZIL (ARICEPT) 5 MG TABLET    Take 5 mg by mouth at bedtime.    FERROUS SULFATE 325 (65 FE) MG TABLET    Take 325 mg by mouth 2 (two) times daily with a meal.   HYDROCODONE-ACETAMINOPHEN (NORCO) 10-325 MG PER TABLET    Take 1 tablet by mouth 2 (two) times daily.   INSULIN LISPRO (HUMALOG) 100 UNIT/ML INJECTION    Inject 5 Units into the skin 3 (three) times daily. Give 5 units for cbg >=150   LEVOTHYROXINE (SYNTHROID, LEVOTHROID) 112 MCG TABLET    Take 112 mcg by mouth daily before breakfast.   LISINOPRIL  (PRINIVIL,ZESTRIL) 20 MG TABLET    Take 20 mg by mouth daily.   LORAZEPAM (ATIVAN) 0.5 MG TABLET    Take 1 tablet (0.5 mg total) by mouth every 8 (eight) hours as needed for anxiety.   METOPROLOL TARTRATE (LOPRESSOR) 25 MG TABLET    Take 12.5 mg by mouth 2 (two) times daily.   PAROXETINE (PAXIL) 10 MG TABLET    Take 40 mg by mouth daily.    POLLEN EXTRACTS (PROSTAT PO)    Take by mouth. 30 cc by mouth BID for albumin 2.7   POTASSIUM CHLORIDE (KLOR-CON) 20 MEQ PACKET    Take 40 mEq by mouth daily. crystals   ROPINIROLE (REQUIP) 3 MG TABLET    Take 3 mg by mouth at bedtime.    VITAMIN B-12 1000 MCG TABLET    Take 1 tablet (1,000 mcg total) by mouth daily.  Modified Medications   No medications on file  Discontinued Medications   No medications on file     SIGNIFICANT DIAGNOSTIC EXAMS  09-19-14: left knee x-ray: Severe knee osteoarthritis  with lateral and posterior tibial translation.  09-19-14: bilateral hip and pelvis x-ray: 1. Mild degenerative changes within both hips. 2. No acute abnormality. 3. Degenerative changes in the lumbar spine. 4. Atherosclerosis.  03-25-15: chest x-ray: no acute cardiopulmonary disease; no pneumonia    LABS REVIEWED:   07-05-14: hgb a1c 5.4 07-07-14: chol 10; ldl 55; trig 89 ;hdl 37 09-16-14: wbc 7.9; hgb 7.1; hct 23.3; mcv 75.9; plt 338; glucose 90; bun 22; creat 0.94; k+3.2; na++135; liver normal albumin 2.3; urine culture: e-coli 09-17-14: wbc 7.4; hgb 7.7; hct 25.0; mcv 76.9; plt 319; glucose 09; bun ;23 creat 0.99; k+3.4; na++136; iron 13; tibc 113; tsh 6.520; vit b12: 1838; folate 10.8 09-19-14: glucose 115; bun 16; creat 0.79; k+4.4; na++136; free t4: 1.17; mag 1.4 09-20-14: wbc 6.2; hgb 9.1; hct 29.1; mcv 78.0; plt 319; glucose 108; bun 15; creat 0.82; k+4.1; na++134; liver normal albumin 2.3  09-26-14: wbc 5.2; hgb 9.8; hct 31.7; mcv 76.4; plt 353; glucose 129; bun 14; creat 0.79; k+ 4.3; na++139; liver normal albumin 2.7 10-24-14: hgb a1c 6.7 11-05-14  glucose 126; bun 10; creat 0.71; k+ 3.4; na++142 11-08-14: wbc 8.7; hgb 9.0; hct 29.0; mcv 77.1; plt 295; iron 22; tibc 115  11-12-14: glucose 135; bun 9; creat 0.75; k+ 3.2; na++144  01-25-15: glucose 87; bun 19; creat 1.45; k+ 4.8; na++141; liver normal albumin 2.7      Review of Systems  Unable to perform ROS: Other      Physical Exam  Constitutional: No distress.  Eyes: Conjunctivae are normal.  Neck: Neck supple. No JVD present. No thyromegaly present.  Cardiovascular: Normal rate, regular rhythm and intact distal pulses.  S3 S4 split  Respiratory: Effort normal and breath sounds normal. No respiratory distress. She has no wheezes.  GI: Soft. Bowel sounds are normal. She exhibits no distension. There is no tenderness.  Musculoskeletal: She exhibits no edema.  Left hemiparesis    Lymphadenopathy:    She has no cervical adenopathy.  Neurological: She is alert.  Skin: Skin is warm and dry. She is not diaphoretic.  Has profon boots to both feet   Psychiatric: She has a normal mood and affect.     ASSESSMENT/ PLAN:  1. CVA: with left hemiparesis: is neurologically stable; will continue plavix 75 mg daily takes vicodin 10/325 mg twice daily for pain management and will monitor   2. Hypothyroidism: will continue synthroid 112 mcg daily and will monitor her free t4: 1.17; tsh 6.520  3. Anemia: is presently stable will continue iron twice daily; vit b12 1000 mcg daily  hgb 9.0; iron level is up to 22 from  13; will monitor  4. Diabetes: is stable will continue humalog 5 units with meals for cbg >=150 hgb is 6.7  5. Hypertension: her blood pressure is elevated will continue  lopressor 12.5 mg twice daily will increase lisinopril to 30 mg daily  6. Restless leg syndrome: will continue requip  3 mg nightly   7.  Depression with anxiety: will continue paxil 40 mg daily has ativan 0.5 mg three times daily as needed will monitor   8. ckd stage III:  Her bun/creat is 10/0.71; will  monitor   9. Hypokalemia: will continue k+  40 meq daily   10. Weight loss and loss of appetite: her current weight is 154 pounds.  will monitor her status; is off remeron will begin prostat 30 cc twice daily for albumin 2.7   11. Left breast cancer T1N1 ER/PR +,  HER 2 negative, 2 sentinel nodes involved:  post mastectomy with 3 node axillary evaluation and 5 years of [facility] thru 06-2013. No known active breast cancer Will monitor              Ok Edwards NP [facility]  Contact [phone] Monday through Friday 8am- 5pm  After hours call [phone]

## 2015-04-11 DIAGNOSIS — R634 Abnormal weight loss: Secondary | ICD-10-CM | POA: Diagnosis not present

## 2015-04-11 DIAGNOSIS — I10 Essential (primary) hypertension: Secondary | ICD-10-CM | POA: Diagnosis not present

## 2015-04-17 DIAGNOSIS — E876 Hypokalemia: Secondary | ICD-10-CM | POA: Diagnosis not present

## 2015-04-17 LAB — BASIC METABOLIC PANEL
BUN: 21 mg/dL (ref 4–21)
CREATININE: 1.2 mg/dL — AB (ref 0.5–1.1)
Glucose: 84 mg/dL
POTASSIUM: 5.1 mmol/L (ref 3.4–5.3)
Sodium: 139 mmol/L (ref 137–147)

## 2015-04-22 DIAGNOSIS — Z961 Presence of intraocular lens: Secondary | ICD-10-CM | POA: Diagnosis not present

## 2015-04-22 DIAGNOSIS — E113553 Type 2 diabetes mellitus with stable proliferative diabetic retinopathy, bilateral: Secondary | ICD-10-CM | POA: Diagnosis not present

## 2015-05-09 ENCOUNTER — Encounter: Payer: Self-pay | Admitting: Adult Health

## 2015-05-09 ENCOUNTER — Non-Acute Institutional Stay (SKILLED_NURSING_FACILITY): Payer: Medicare Other | Admitting: Adult Health

## 2015-05-09 DIAGNOSIS — F0390 Unspecified dementia without behavioral disturbance: Secondary | ICD-10-CM

## 2015-05-09 DIAGNOSIS — E1122 Type 2 diabetes mellitus with diabetic chronic kidney disease: Secondary | ICD-10-CM | POA: Diagnosis not present

## 2015-05-09 DIAGNOSIS — E034 Atrophy of thyroid (acquired): Secondary | ICD-10-CM

## 2015-05-09 DIAGNOSIS — C50912 Malignant neoplasm of unspecified site of left female breast: Secondary | ICD-10-CM | POA: Diagnosis not present

## 2015-05-09 DIAGNOSIS — N189 Chronic kidney disease, unspecified: Secondary | ICD-10-CM | POA: Diagnosis not present

## 2015-05-09 DIAGNOSIS — I69354 Hemiplegia and hemiparesis following cerebral infarction affecting left non-dominant side: Secondary | ICD-10-CM | POA: Diagnosis not present

## 2015-05-09 DIAGNOSIS — N183 Chronic kidney disease, stage 3 (moderate): Secondary | ICD-10-CM

## 2015-05-09 DIAGNOSIS — I6330 Cerebral infarction due to thrombosis of unspecified cerebral artery: Secondary | ICD-10-CM | POA: Diagnosis not present

## 2015-05-09 DIAGNOSIS — E038 Other specified hypothyroidism: Secondary | ICD-10-CM

## 2015-05-09 DIAGNOSIS — E876 Hypokalemia: Secondary | ICD-10-CM

## 2015-05-09 DIAGNOSIS — I10 Essential (primary) hypertension: Secondary | ICD-10-CM | POA: Diagnosis not present

## 2015-05-09 DIAGNOSIS — D631 Anemia in chronic kidney disease: Secondary | ICD-10-CM

## 2015-05-09 DIAGNOSIS — Z794 Long term (current) use of insulin: Secondary | ICD-10-CM

## 2015-05-09 NOTE — Progress Notes (Signed)
Patient ID: [name], female   DOB: 11-17-1930, 82 y.o.   MRN: [id number]   [facility]ility[name]       Allergies  Allergen Reactions  . Aspirin Hives  . Penicillins Hives    Chief Complaint  Patient presents with  . Medical Management of Chronic Issues    Follow up    HPI:  She is a long term resident of this facility being seen for the management of her chronic illnesses. Overall there is little change in her status. Her weight is stable at 157 pounds. She is unable to fully participate in the hpi or ros; but did me that she was ok. There are no nursing concerns at this time.   Past Medical History  Diagnosis Date  . Hemorrhoid   . Anemia   . Arthritis   . Stroke ([place])   . Diabetes mellitus without complication ([place])   . Hypertension   . Chronic kidney disease   . Thyroid disease     Past Surgical History  Procedure Laterality Date  . Breast lumpectomy      LEFT  . Abdominal hysterectomy      PARTIAL  . Mastectomy  05/2008    Left    VITAL SIGNS BP 157/74 mmHg  Pulse 62  Temp(Src) 96.7 F (35.9 C) (Oral)  Resp 20  Ht 5\' 1"  (1.549 m)  Wt 157 lb 5 oz (71.356 kg)  BMI 29.74 kg/m2  SpO2 95%  Patient's Medications  New Prescriptions   No medications on file  Previous Medications   ASCORBIC ACID (VITAMIN C) 1000 MG TABLET    Take 1,000 mg by mouth daily.   CHOLECALCIFEROL (VITAMIN D) 2000 UNITS TABLET    Take 2,000 Units by mouth daily.   CLOPIDOGREL (PLAVIX) 75 MG TABLET    Take 1 tablet (75 mg total) by mouth daily.   DONEPEZIL (ARICEPT) 5 MG TABLET    Take 5 mg by mouth at bedtime.    FERROUS SULFATE 325 (65 FE) MG TABLET    Take 325 mg by mouth 2 (two) times daily with a meal.   HYDROCODONE-ACETAMINOPHEN (NORCO) 10-325 MG PER TABLET    Take 1 tablet by mouth 2 (two) times daily.   INSULIN LISPRO (HUMALOG) 100 UNIT/ML INJECTION    Inject 5 Units into the skin 3 (three) times daily. Give 5 units for cbg >=150   LEVOTHYROXINE (SYNTHROID,  LEVOTHROID) 112 MCG TABLET    Take 112 mcg by mouth daily before breakfast.   LISINOPRIL (PRINIVIL,ZESTRIL) 30 MG TABLET    Take 30 mg by mouth daily.   LORAZEPAM (ATIVAN) 0.5 MG TABLET    Take 1 tablet (0.5 mg total) by mouth every 8 (eight) hours as needed for anxiety.   METOPROLOL TARTRATE (LOPRESSOR) 25 MG TABLET    Take 12.5 mg by mouth 2 (two) times daily.   PAROXETINE (PAXIL) 10 MG TABLET    Take 40 mg by mouth daily.    POLLEN EXTRACTS (PROSTAT PO)    Take by mouth. 30 cc by mouth BID for albumin 2.7   POTASSIUM CHLORIDE (KLOR-CON) 20 MEQ PACKET    Take 40 mEq by mouth daily. crystals   ROPINIROLE (REQUIP) 3 MG TABLET    Take 3 mg by mouth at bedtime.    VITAMIN B-12 1000 MCG TABLET    Take 1 tablet (1,000 mcg total) by mouth daily.  Modified Medications   No medications on file  Discontinued Medications   LISINOPRIL (PRINIVIL,ZESTRIL)  20 MG TABLET    Take 20 mg by mouth daily. Reported on 05/09/2015     SIGNIFICANT DIAGNOSTIC EXAMS  09-19-14: left knee x-ray: Severe knee osteoarthritis with lateral and posterior tibial translation.  09-19-14: bilateral hip and pelvis x-ray: 1. Mild degenerative changes within both hips. 2. No acute abnormality. 3. Degenerative changes in the lumbar spine. 4. Atherosclerosis.  03-25-15: chest x-ray: no acute cardiopulmonary disease; no pneumonia    LABS REVIEWED:   07-05-14: hgb a1c 5.4 07-07-14: chol 10; ldl 55; trig 89 ;hdl 37 09-16-14: wbc 7.9; hgb 7.1; hct 23.3; mcv 75.9; plt 338; glucose 90; bun 22; creat 0.94; k+3.2; na++135; liver normal albumin 2.3; urine culture: e-coli 09-17-14: wbc 7.4; hgb 7.7; hct 25.0; mcv 76.9; plt 319; glucose 09; bun ;23 creat 0.99; k+3.4; na++136; iron 13; tibc 113; tsh 6.520; vit b12: 1838; folate 10.8 09-19-14: glucose 115; bun 16; creat 0.79; k+4.4; na++136; free t4: 1.17; mag 1.4 09-20-14: wbc 6.2; hgb 9.1; hct 29.1; mcv 78.0; plt 319; glucose 108; bun 15; creat 0.82; k+4.1; na++134; liver normal albumin 2.3    09-26-14: wbc 5.2; hgb 9.8; hct 31.7; mcv 76.4; plt 353; glucose 129; bun 14; creat 0.79; k+ 4.3; na++139; liver normal albumin 2.7 10-24-14: hgb a1c 6.7 11-05-14 glucose 126; bun 10; creat 0.71; k+ 3.4; na++142 11-08-14: wbc 8.7; hgb 9.0; hct 29.0; mcv 77.1; plt 295; iron 22; tibc 115  11-12-14: glucose 135; bun 9; creat 0.75; k+ 3.2; na++144  01-25-15: glucose 87; bun 19; creat 1.45; k+ 4.8; na++141; liver normal albumin 2.7  04-11-15: glucose 108; bun 22; creat 1.40; k+ 5.5; na++140  04-17-15; glucose 84; bun 21; creat 1.22; k+ 5.1; na++139      Review of Systems  Unable to perform ROS: Other      Physical Exam  Constitutional: No distress.  Eyes: Conjunctivae are normal.  Neck: Neck supple. No JVD present. No thyromegaly present.  Cardiovascular: Normal rate, regular rhythm and intact distal pulses.  S3 S4 split  Respiratory: Effort normal and breath sounds normal. No respiratory distress. She has no wheezes.  GI: Soft. Bowel sounds are normal. She exhibits no distension. There is no tenderness.  Musculoskeletal: She exhibits no edema.  Left hemiparesis    Lymphadenopathy:    She has no cervical adenopathy.  Neurological: She is alert.  Skin: Skin is warm and dry. She is not diaphoretic.  Has profon boots to both feet   Psychiatric: She has a normal mood and affect.     ASSESSMENT/ PLAN:  1. CVA: with left hemiparesis: is neurologically stable; will continue plavix 75 mg daily takes vicodin 10/325 mg twice daily for pain management and will monitor   2. Hypothyroidism: will continue synthroid 112 mcg daily and will monitor her free t4: 1.17; tsh 6.520  3. Anemia: is presently stable will continue iron twice daily; vit b12 1000 mcg daily  hgb 9.0; iron level is up to 22 from  13; will monitor  4. Diabetes: is stable will continue humalog 5 units with meals for cbg >=150 hgb is 6.7  5. Hypertension: her blood pressure is elevated will continue  lopressor 12.5 mg twice daily   Will increase lisinopril to 40 mg daily   6. Restless leg syndrome: will continue requip  3 mg nightly   7.  Depression with anxiety: will continue paxil 40 mg daily has ativan 0.5 mg three times daily as needed will monitor   8. ckd stage III:  Her bun/creat is  21/1.22; will monitor   9. Hypokalemia: will reduce her k+ to 10 meq daily will repeat bmp in 2 weeks.   10. Weight loss and loss of appetite: her current weight is 157 pounds.  will monitor her status; is albumin 2.7   11. Left breast cancer T1N1 ER/PR +, HER 2 negative, 2 sentinel nodes involved:  post mastectomy with 3 node axillary evaluation and 5 years of [facility] thru 06-2013. No known active breast cancer Will monitor    In 2 weeks will check; cbc; cmp; lipids; hgb a1c urine for micro-albumin       Ok Edwards NP [facility]  Contact [phone] Monday through Friday 8am- 5pm  After hours call [phone]

## 2015-05-17 DIAGNOSIS — F0391 Unspecified dementia with behavioral disturbance: Secondary | ICD-10-CM | POA: Diagnosis not present

## 2015-05-17 DIAGNOSIS — F419 Anxiety disorder, unspecified: Secondary | ICD-10-CM | POA: Diagnosis not present

## 2015-05-24 DIAGNOSIS — E1122 Type 2 diabetes mellitus with diabetic chronic kidney disease: Secondary | ICD-10-CM | POA: Diagnosis not present

## 2015-05-24 DIAGNOSIS — E039 Hypothyroidism, unspecified: Secondary | ICD-10-CM | POA: Diagnosis not present

## 2015-05-24 DIAGNOSIS — E876 Hypokalemia: Secondary | ICD-10-CM | POA: Diagnosis not present

## 2015-05-24 DIAGNOSIS — Z13220 Encounter for screening for lipoid disorders: Secondary | ICD-10-CM | POA: Diagnosis not present

## 2015-05-24 LAB — CBC AND DIFFERENTIAL
HEMATOCRIT: 27 % — AB (ref 36–46)
Hemoglobin: 8.3 g/dL — AB (ref 12.0–16.0)
PLATELETS: 200 10*3/uL (ref 150–399)
WBC: 5 10^3/mL

## 2015-05-24 LAB — HEPATIC FUNCTION PANEL
ALK PHOS: 86 U/L (ref 25–125)
ALT: 17 U/L (ref 7–35)
AST: 17 U/L (ref 13–35)

## 2015-05-24 LAB — LIPID PANEL
Cholesterol: 111 mg/dL (ref 0–200)
HDL: 45 mg/dL (ref 35–70)
LDL Cholesterol: 51 mg/dL
Triglycerides: 75 mg/dL (ref 40–160)

## 2015-05-24 LAB — BASIC METABOLIC PANEL
BUN: 35 mg/dL — AB (ref 4–21)
Creatinine: 1.4 mg/dL — AB (ref 0.5–1.1)
GLUCOSE: 95 mg/dL
Potassium: 4.2 mmol/L (ref 3.4–5.3)
SODIUM: 140 mmol/L (ref 137–147)

## 2015-05-24 LAB — TSH: TSH: 4.38 u[IU]/mL (ref 0.41–5.90)

## 2015-05-24 LAB — MICROALBUMIN, URINE: MICROALB UR: 23.6

## 2015-05-24 LAB — HEMOGLOBIN A1C: Hemoglobin A1C: 5.5

## 2015-05-29 DIAGNOSIS — N390 Urinary tract infection, site not specified: Secondary | ICD-10-CM | POA: Diagnosis not present

## 2015-06-04 DIAGNOSIS — I70203 Unspecified atherosclerosis of native arteries of extremities, bilateral legs: Secondary | ICD-10-CM | POA: Diagnosis not present

## 2015-06-04 DIAGNOSIS — E1159 Type 2 diabetes mellitus with other circulatory complications: Secondary | ICD-10-CM | POA: Diagnosis not present

## 2015-06-04 DIAGNOSIS — M2011 Hallux valgus (acquired), right foot: Secondary | ICD-10-CM | POA: Diagnosis not present

## 2015-06-04 DIAGNOSIS — B351 Tinea unguium: Secondary | ICD-10-CM | POA: Diagnosis not present

## 2015-06-10 ENCOUNTER — Encounter: Payer: Self-pay | Admitting: Adult Health

## 2015-06-10 ENCOUNTER — Non-Acute Institutional Stay (SKILLED_NURSING_FACILITY): Payer: Medicare Other | Admitting: Adult Health

## 2015-06-10 DIAGNOSIS — E038 Other specified hypothyroidism: Secondary | ICD-10-CM | POA: Diagnosis not present

## 2015-06-10 DIAGNOSIS — I6330 Cerebral infarction due to thrombosis of unspecified cerebral artery: Secondary | ICD-10-CM

## 2015-06-10 DIAGNOSIS — D500 Iron deficiency anemia secondary to blood loss (chronic): Secondary | ICD-10-CM

## 2015-06-10 DIAGNOSIS — I69354 Hemiplegia and hemiparesis following cerebral infarction affecting left non-dominant side: Secondary | ICD-10-CM | POA: Diagnosis not present

## 2015-06-10 DIAGNOSIS — I10 Essential (primary) hypertension: Secondary | ICD-10-CM | POA: Diagnosis not present

## 2015-06-10 DIAGNOSIS — E876 Hypokalemia: Secondary | ICD-10-CM

## 2015-06-10 DIAGNOSIS — C50912 Malignant neoplasm of unspecified site of left female breast: Secondary | ICD-10-CM | POA: Diagnosis not present

## 2015-06-10 DIAGNOSIS — E034 Atrophy of thyroid (acquired): Secondary | ICD-10-CM

## 2015-06-10 DIAGNOSIS — F0390 Unspecified dementia without behavioral disturbance: Secondary | ICD-10-CM

## 2015-06-10 NOTE — Progress Notes (Signed)
Patient ID: [name], female   DOB: [birth year], 82 y.o.   MRN: [id number]    Location:  [facility] Room Number: 117-A Place of Service:  SNF (31)   CODE STATUS: Full Code   Chief Complaint  Patient presents with  . Medical Management of Chronic Issues    Follow up    HPI:  She is a long term resident of this facility being seen for the management of her chronic illnesses. Overall her status is stable with her weight at 160 pounds. She is unable to fully participate in the hpi or rors. There are no nursing concerns at this time.   Past Medical History  Diagnosis Date  . Hemorrhoid   . Anemia   . Arthritis   . Stroke ([place])   . Diabetes mellitus without complication ([place])   . Hypertension   . Chronic kidney disease   . Thyroid disease     Past Surgical History  Procedure Laterality Date  . Breast lumpectomy      LEFT  . Abdominal hysterectomy      PARTIAL  . Mastectomy  05/2008    Left    Social History   Social History  . Marital Status: Single    Spouse Name: N/A  . Number of Children: N/A  . Years of Education: N/A   Occupational History  . Not on file.   Social History Main Topics  . Smoking status: Never Smoker   . Smokeless tobacco: Not on file  . Alcohol Use: No  . Drug Use: No  . Sexual Activity: Not on file   Other Topics Concern  . Not on file   Social History Narrative   History reviewed. No pertinent family history.    VITAL SIGNS BP 142/76 mmHg  Pulse 60  Temp(Src) 97.6 F (36.4 C) (Oral)  Resp 18  Ht 5\' 1"  (1.549 m)  Wt 160 lb 2 oz (72.632 kg)  BMI 30.27 kg/m2  SpO2 96%  Patient's Medications  New Prescriptions   No medications on file  Previous Medications   ASCORBIC ACID (VITAMIN C) 1000 MG TABLET    Take 1,000 mg by mouth daily.   CHOLECALCIFEROL (VITAMIN D) 2000 UNITS TABLET    Take 2,000 Units by mouth daily.   CLOPIDOGREL (PLAVIX) 75 MG TABLET    Take 1 tablet (75 mg total) by  mouth daily.   DONEPEZIL (ARICEPT) 5 MG TABLET    Take 5 mg by mouth at bedtime.    FERROUS SULFATE 325 (65 FE) MG TABLET    Take 325 mg by mouth 2 (two) times daily with a meal.   HYDROCODONE-ACETAMINOPHEN (NORCO) 10-325 MG PER TABLET    Take 1 tablet by mouth 2 (two) times daily.   INSULIN LISPRO (HUMALOG) 100 UNIT/ML INJECTION    Inject 5 Units into the skin 3 (three) times daily. Give 5 units for cbg >=150   LEVOTHYROXINE (SYNTHROID, LEVOTHROID) 112 MCG TABLET    Take 112 mcg by mouth daily before breakfast.   LISINOPRIL (PRINIVIL,ZESTRIL) 40 MG TABLET    Take 40 mg by mouth daily.   LORAZEPAM (ATIVAN) 0.5 MG TABLET    Take 1 tablet (0.5 mg total) by mouth every 8 (eight) hours as needed for anxiety.   METOPROLOL TARTRATE (LOPRESSOR) 25 MG TABLET    Take 12.5 mg by mouth 2 (two) times daily.   NUTRITIONAL SUPPLEMENTS (NUTRITIONAL SUPPLEMENT PO)    Take 120 mLs by mouth 4 (  four) times daily. Puree texture, regular consistency   PAROXETINE (PAXIL) 40 MG TABLET    Take 40 mg by mouth daily.   POLLEN EXTRACTS (PROSTAT PO)    Take by mouth. 30 cc by mouth BID for albumin 2.7   POTASSIUM CHLORIDE (K-DUR) 10 MEQ TABLET    Take 10 mEq by mouth daily.   ROPINIROLE (REQUIP) 3 MG TABLET    Take 3 mg by mouth at bedtime.    VITAMIN B-12 1000 MCG TABLET    Take 1 tablet (1,000 mcg total) by mouth daily.  Modified Medications   No medications on file  Discontinued Medications   LISINOPRIL (PRINIVIL,ZESTRIL) 30 MG TABLET    Take 30 mg by mouth daily. Reported on 06/10/2015   PAROXETINE (PAXIL) 10 MG TABLET    Take 40 mg by mouth daily. Reported on 06/10/2015   POTASSIUM CHLORIDE (KLOR-CON) 20 MEQ PACKET    Take 40 mEq by mouth daily. crystals     SIGNIFICANT DIAGNOSTIC EXAMS  09-19-14: left knee x-ray: Severe knee osteoarthritis with lateral and posterior tibial translation.  09-19-14: bilateral hip and pelvis x-ray: 1. Mild degenerative changes within both hips. 2. No acute abnormality. 3.  Degenerative changes in the lumbar spine. 4. Atherosclerosis.  03-25-15: chest x-ray: no acute cardiopulmonary disease; no pneumonia    LABS REVIEWED:   07-05-14: hgb a1c 5.4 07-07-14: chol 10; ldl 55; trig 89 ;hdl 37 09-16-14: wbc 7.9; hgb 7.1; hct 23.3; mcv 75.9; plt 338; glucose 90; bun 22; creat 0.94; k+3.2; na++135; liver normal albumin 2.3; urine culture: e-coli 09-17-14: wbc 7.4; hgb 7.7; hct 25.0; mcv 76.9; plt 319; glucose 09; bun ;23 creat 0.99; k+3.4; na++136; iron 13; tibc 113; tsh 6.520; vit b12: 1838; folate 10.8 09-19-14: glucose 115; bun 16; creat 0.79; k+4.4; na++136; free t4: 1.17; mag 1.4 09-20-14: wbc 6.2; hgb 9.1; hct 29.1; mcv 78.0; plt 319; glucose 108; bun 15; creat 0.82; k+4.1; na++134; liver normal albumin 2.3  09-26-14: wbc 5.2; hgb 9.8; hct 31.7; mcv 76.4; plt 353; glucose 129; bun 14; creat 0.79; k+ 4.3; na++139; liver normal albumin 2.7 10-24-14: hgb a1c 6.7 11-05-14 glucose 126; bun 10; creat 0.71; k+ 3.4; na++142 11-08-14: wbc 8.7; hgb 9.0; hct 29.0; mcv 77.1; plt 295; iron 22; tibc 115  11-12-14: glucose 135; bun 9; creat 0.75; k+ 3.2; na++144  01-25-15: glucose 87; bun 19; creat 1.45; k+ 4.8; na++141; liver normal albumin 2.7  04-11-15: glucose 108; bun 22; creat 1.40; k+ 5.5; na++140  04-17-15; glucose 84; bun 21; creat 1.22; k+ 5.1; na++139  05-24-15: wbc 5.0; hgb 8.3; hct 27.1; mcv 79.2; plt 200; glucose 95; bun 35; creat 1.36; k+ 4.2; na++ 140 liver normal albumin 2.9; chol 111; ldl 51; trig 75; hdl 45; tsh 4.38; hgb a1c 5.5; urine micro-albumin 23.6      Review of Systems  Unable to perform ROS: Other      Physical Exam  Constitutional: No distress.  Eyes: Conjunctivae are normal.  Neck: Neck supple. No JVD present. No thyromegaly present.  Cardiovascular: Normal rate, regular rhythm and intact distal pulses.  S3 S4 split  Respiratory: Effort normal and breath sounds normal. No respiratory distress. She has no wheezes.  GI: Soft. Bowel sounds are normal.  She exhibits no distension. There is no tenderness.  Musculoskeletal: She exhibits no edema.  Left hemiparesis    Lymphadenopathy:    She has no cervical adenopathy.  Neurological: She is alert.  Skin: Skin is warm and dry. She  is not diaphoretic.  Has profon boots to both feet   Psychiatric: She has a normal mood and affect.     ASSESSMENT/ PLAN:  1. CVA: with left hemiparesis: is neurologically stable; will continue plavix 75 mg daily takes vicodin 10/325 mg twice daily for pain management and will monitor   2. Hypothyroidism: will continue synthroid 112 mcg daily and will monitor her free t4: 1.17; tsh 4.38  3. Anemia: is presently stable will continue iron twice daily; vit b12 1000 mcg daily  hgb 8.3 iron level is up to 22 from  13; will monitor  4. Diabetes:  hgb is 5.5  Will stop the novolog and will monitor her status.   5. Hypertension: her blood pressure is elevated will continue  lopressor 12.5 mg twice daily  lisinopril  40 mg daily   6. Restless leg syndrome: will continue requip  3 mg nightly   7.  Depression with anxiety: will continue paxil 40 mg daily has ativan 0.5 mg three times daily as needed will monitor   8. ckd stage III:  Her bun/creat is 35/1.36; will monitor   9. Hypokalemia: will continue  k+ to 10 meq daily  K+ is 4.2   10. Weight loss and loss of appetite: her current weight is 160  pounds.  will monitor her status; is albumin 2.9   11. Left breast cancer T1N1 ER/PR +, HER 2 negative, 2 sentinel nodes involved:  post mastectomy with 3 node axillary evaluation and 5 years of [facility] thru 06-2013. No known active breast cancer Will monitor     Will setup mammogram     Ok Edwards NP [facility]  Contact [phone] Monday through Friday 8am- 5pm  After hours call [phone]

## 2015-06-12 ENCOUNTER — Other Ambulatory Visit: Payer: Self-pay | Admitting: Internal Medicine

## 2015-06-12 DIAGNOSIS — Z9012 Acquired absence of left breast and nipple: Secondary | ICD-10-CM

## 2015-06-12 DIAGNOSIS — Z1231 Encounter for screening mammogram for malignant neoplasm of breast: Secondary | ICD-10-CM

## 2015-06-25 ENCOUNTER — Ambulatory Visit: Payer: Medicare Other

## 2015-07-08 ENCOUNTER — Non-Acute Institutional Stay (SKILLED_NURSING_FACILITY): Payer: Medicare Other | Admitting: Adult Health

## 2015-07-08 DIAGNOSIS — I6330 Cerebral infarction due to thrombosis of unspecified cerebral artery: Secondary | ICD-10-CM | POA: Diagnosis not present

## 2015-07-08 DIAGNOSIS — C50912 Malignant neoplasm of unspecified site of left female breast: Secondary | ICD-10-CM

## 2015-07-08 DIAGNOSIS — E038 Other specified hypothyroidism: Secondary | ICD-10-CM

## 2015-07-08 DIAGNOSIS — N189 Chronic kidney disease, unspecified: Secondary | ICD-10-CM | POA: Diagnosis not present

## 2015-07-08 DIAGNOSIS — E034 Atrophy of thyroid (acquired): Secondary | ICD-10-CM

## 2015-07-08 DIAGNOSIS — F0390 Unspecified dementia without behavioral disturbance: Secondary | ICD-10-CM

## 2015-07-08 DIAGNOSIS — I69354 Hemiplegia and hemiparesis following cerebral infarction affecting left non-dominant side: Secondary | ICD-10-CM

## 2015-07-08 DIAGNOSIS — I10 Essential (primary) hypertension: Secondary | ICD-10-CM

## 2015-07-08 DIAGNOSIS — D631 Anemia in chronic kidney disease: Secondary | ICD-10-CM | POA: Diagnosis not present

## 2015-07-18 ENCOUNTER — Encounter: Payer: Self-pay | Admitting: Adult Health

## 2015-07-18 NOTE — Progress Notes (Signed)
Location:    [facility] manager of Service:  SNF (31)   CODE STATUS: full code  Allergies  Allergen Reactions  . Aspirin Hives  . Penicillins Hives    Chief Complaint  Patient presents with  . Medical Management of Chronic Issues    HPI:  She is a long term resident of this facility being seen for the management of her chronic illnesses. Overall her status is stable. She is not voicing any complaints today; she told me that she feels good. There are no nursing concerns at this time.    Past Medical History  Diagnosis Date  . Hemorrhoid   . Anemia   . Arthritis   . Stroke ([place])   . Diabetes mellitus without complication ([place])   . Hypertension   . Chronic kidney disease   . Thyroid disease     Past Surgical History  Procedure Laterality Date  . Breast lumpectomy      LEFT  . Abdominal hysterectomy      PARTIAL  . Mastectomy  05/2008    Left    Social History   Social History  . Marital Status: Single    Spouse Name: N/A  . Number of Children: N/A  . Years of Education: N/A   Occupational History  . Not on file.   Social History Main Topics  . Smoking status: Never Smoker   . Smokeless tobacco: Not on file  . Alcohol Use: No  . Drug Use: No  . Sexual Activity: Not on file   Other Topics Concern  . Not on file   Social History Narrative   No family history on file.    VITAL SIGNS BP 132/58 mmHg  Pulse 60  Ht 5\' 1"  (1.549 m)  Wt 157 lb 9.6 oz (71.487 kg)  BMI 29.79 kg/m2  SpO2 97%  Patient's Medications  New Prescriptions   No medications on file  Previous Medications   ASCORBIC ACID (VITAMIN C) 1000 MG TABLET    Take 1,000 mg by mouth daily.   CHOLECALCIFEROL (VITAMIN D) 2000 UNITS TABLET    Take 2,000 Units by mouth daily.   CLOPIDOGREL (PLAVIX) 75 MG TABLET    Take 1 tablet (75 mg total) by mouth daily.   DONEPEZIL (ARICEPT) 5 MG TABLET    Take 5 mg by mouth at bedtime.    FERROUS SULFATE 325 (65 FE) MG TABLET    Take 325  mg by mouth 2 (two) times daily with a meal.   HYDROCODONE-ACETAMINOPHEN (NORCO) 10-325 MG PER TABLET    Take 1 tablet by mouth 2 (two) times daily.   INSULIN LISPRO (HUMALOG) 100 UNIT/ML INJECTION    Inject 5 Units into the skin 3 (three) times daily. Give 5 units for cbg >=150   LEVOTHYROXINE (SYNTHROID, LEVOTHROID) 112 MCG TABLET    Take 112 mcg by mouth daily before breakfast.   LISINOPRIL (PRINIVIL,ZESTRIL) 40 MG TABLET    Take 40 mg by mouth daily.   LORAZEPAM (ATIVAN) 0.5 MG TABLET    Take 1 tablet (0.5 mg total) by mouth every 8 (eight) hours as needed for anxiety.   METOPROLOL TARTRATE (LOPRESSOR) 25 MG TABLET    Take 12.5 mg by mouth 2 (two) times daily.   NUTRITIONAL SUPPLEMENTS (NUTRITIONAL SUPPLEMENT PO)    Take 120 mLs by mouth 4 (four) times daily. Puree texture, regular consistency   PAROXETINE (PAXIL) 40 MG TABLET    Take 40 mg by  mouth daily.   POLLEN EXTRACTS (PROSTAT PO)    Take by mouth. 30 cc by mouth BID for albumin 2.7   POTASSIUM CHLORIDE (K-DUR) 10 MEQ TABLET    Take 10 mEq by mouth daily.   ROPINIROLE (REQUIP) 3 MG TABLET    Take 3 mg by mouth at bedtime.    VITAMIN B-12 1000 MCG TABLET    Take 1 tablet (1,000 mcg total) by mouth daily.  Modified Medications   No medications on file  Discontinued Medications   No medications on file     SIGNIFICANT DIAGNOSTIC EXAMS   09-19-14: left knee x-ray: Severe knee osteoarthritis with lateral and posterior tibial translation.  09-19-14: bilateral hip and pelvis x-ray: 1. Mild degenerative changes within both hips. 2. No acute abnormality. 3. Degenerative changes in the lumbar spine. 4. Atherosclerosis.  03-25-15: chest x-ray: no acute cardiopulmonary disease; no pneumonia    LABS REVIEWED:   09-16-14: wbc 7.9; hgb 7.1; hct 23.3; mcv 75.9; plt 338; glucose 90; bun 22; creat 0.94; k+3.2; na++135; liver normal albumin 2.3; urine culture: e-coli 09-17-14: wbc 7.4; hgb 7.7; hct 25.0; mcv 76.9; plt 319; glucose 09; bun ;23  creat 0.99; k+3.4; na++136; iron 13; tibc 113; tsh 6.520; vit b12: 1838; folate 10.8 09-19-14: glucose 115; bun 16; creat 0.79; k+4.4; na++136; free t4: 1.17; mag 1.4 09-20-14: wbc 6.2; hgb 9.1; hct 29.1; mcv 78.0; plt 319; glucose 108; bun 15; creat 0.82; k+4.1; na++134; liver normal albumin 2.3  09-26-14: wbc 5.2; hgb 9.8; hct 31.7; mcv 76.4; plt 353; glucose 129; bun 14; creat 0.79; k+ 4.3; na++139; liver normal albumin 2.7 10-24-14: hgb a1c 6.7 11-05-14 glucose 126; bun 10; creat 0.71; k+ 3.4; na++142 11-08-14: wbc 8.7; hgb 9.0; hct 29.0; mcv 77.1; plt 295; iron 22; tibc 115  11-12-14: glucose 135; bun 9; creat 0.75; k+ 3.2; na++144  01-25-15: glucose 87; bun 19; creat 1.45; k+ 4.8; na++141; liver normal albumin 2.7  04-11-15: glucose 108; bun 22; creat 1.40; k+ 5.5; na++140  04-17-15; glucose 84; bun 21; creat 1.22; k+ 5.1; na++139  05-24-15: wbc 5.0; hgb 8.3; hct 27.1; mcv 79.2; plt 200; glucose 95; bun 35; creat 1.36; k+ 4.2; na++ 140 liver normal albumin 2.9; chol 111; ldl 51; trig 75; hdl 45; tsh 4.38; hgb a1c 5.5; urine micro-albumin 23.6      Review of Systems  Unable to perform ROS: Other      Physical Exam  Constitutional: No distress.  Eyes: Conjunctivae are normal.  Neck: Neck supple. No JVD present. No thyromegaly present.  Cardiovascular: Normal rate, regular rhythm and intact distal pulses.  S3 S4 split  Respiratory: Effort normal and breath sounds normal. No respiratory distress. She has no wheezes.  GI: Soft. Bowel sounds are normal. She exhibits no distension. There is no tenderness.  Musculoskeletal: She exhibits no edema.  Left hemiparesis    Lymphadenopathy:    She has no cervical adenopathy.  Neurological: She is alert.  Skin: Skin is warm and dry. She is not diaphoretic.  Has profon boots to both feet   Psychiatric: She has a normal mood and affect.     ASSESSMENT/ PLAN:  1. CVA: with left hemiparesis: is neurologically stable; will continue plavix 75 mg daily  takes vicodin 10/325 mg twice daily for pain management and will monitor   2. Hypothyroidism: will continue synthroid 112 mcg daily and will monitor her free t4: 1.17; tsh 4.38  3. Anemia: is presently stable will continue iron twice daily;  vit b12 1000 mcg daily  hgb 8.3 iron level is up to 22 from  13; will monitor  4. Diabetes:  hgb is 5.5  Will stop the novolog and will monitor her status.   5. Hypertension: her blood pressure is elevated will continue  lopressor 12.5 mg twice daily  lisinopril  40 mg daily   6. Restless leg syndrome: will continue requip  3 mg nightly   7.  Depression with anxiety: will continue paxil 40 mg daily has ativan 0.5 mg three times daily as needed will monitor   8. ckd stage III:  Her bun/creat is 35/1.36; will monitor   9. Hypokalemia: will continue  k+ to 10 meq daily  K+ is 4.2   10. Weight loss and loss of appetite: her current weight is 160  pounds.  will monitor her status; is albumin 2.9   11. Left breast cancer T1N1 ER/PR +, HER 2 negative, 2 sentinel nodes involved:  post mastectomy with 3 node axillary evaluation and 5 years of [facility] thru 06-2013. No known active breast cancer Will monitor     Ok Edwards NP [facility]  Contact [phone] Monday through Friday 8am- 5pm  After hours call [phone]

## 2015-08-06 ENCOUNTER — Encounter: Payer: Self-pay | Admitting: Internal Medicine

## 2015-08-06 ENCOUNTER — Non-Acute Institutional Stay (SKILLED_NURSING_FACILITY): Payer: Medicare Other | Admitting: Internal Medicine

## 2015-08-06 DIAGNOSIS — I10 Essential (primary) hypertension: Secondary | ICD-10-CM | POA: Diagnosis not present

## 2015-08-06 DIAGNOSIS — F0390 Unspecified dementia without behavioral disturbance: Secondary | ICD-10-CM | POA: Diagnosis not present

## 2015-08-06 DIAGNOSIS — E034 Atrophy of thyroid (acquired): Secondary | ICD-10-CM | POA: Diagnosis not present

## 2015-08-06 DIAGNOSIS — D500 Iron deficiency anemia secondary to blood loss (chronic): Secondary | ICD-10-CM

## 2015-08-06 DIAGNOSIS — E1122 Type 2 diabetes mellitus with diabetic chronic kidney disease: Secondary | ICD-10-CM | POA: Diagnosis not present

## 2015-08-06 DIAGNOSIS — I69354 Hemiplegia and hemiparesis following cerebral infarction affecting left non-dominant side: Secondary | ICD-10-CM

## 2015-08-06 DIAGNOSIS — E038 Other specified hypothyroidism: Secondary | ICD-10-CM | POA: Diagnosis not present

## 2015-08-06 DIAGNOSIS — Z794 Long term (current) use of insulin: Secondary | ICD-10-CM

## 2015-08-06 DIAGNOSIS — Z853 Personal history of malignant neoplasm of breast: Secondary | ICD-10-CM | POA: Diagnosis not present

## 2015-08-06 DIAGNOSIS — N183 Chronic kidney disease, stage 3 (moderate): Secondary | ICD-10-CM

## 2015-08-06 NOTE — Progress Notes (Signed)
Patient ID: Aphrodite Willeford, female   DOB: 1930-10-04, 82 y.o.   MRN: [id number]     DATE: 08/06/15  Location:  Nursing Home Location: [place] Room Number: 117 A Place of Service: SNF (31)   Extended Emergency Contact Information Primary Emergency Contact: Droke,Eleanor Address: [address], [place] Phone: [phone] Relation: Daughter Secondary Emergency Contact: Claudette [place] Phone: [phone] Mobile Phone: [phone] Relation: Sister  Advanced Directive information Does patient have an advance directive?: No, Would patient like information on creating an advanced directive?: No - patient declined information  Chief Complaint  Patient presents with  . Medical Management of Chronic Issues    Routine Visit    HPI:  82 yo female long term resident seen today for f/u. She has no c/o. No nursing issues. No falls. Appetite is excellent. Sleeping well. She is a poor historian due to dementia. Hx obtained from chart  Hx CVA - she has left hemiparesis; stable on plavix 75 mg daily; vicodin 10/325 mg twice daily for pain management    Hypothyroidism - stable on synthroid 112 mcg daily. free t4: 1.17; tsh 4.38  Hx Anemia -  stable on iron twice daily; vit b12 1000 mcg daily. Hgb 8.3; iron improved to 22  DM - controlled with A1c  5.5%. Diet controlled  Hypertension - BP elevated on  lopressor 12.5 mg twice daily;  lisinopril  40 mg daily   Restless leg syndrome - stable on requip  3 mg nightly   Depression with anxiety - mood stable on paxil 40 mg daily; ativan 0.5 mg three times daily as needed   CKD stage III -  bun/creat is 35/1.36  Hypokalemia - stable on  k+ to 10 meq daily.  K+ is 4.2   Weight loss and loss of appetite - current weight per facility is 184 lbs (up 24 lbs). Albumin 2.9   Hx Left breast cancer T1N1 ER/PR +, HER 2 negative,  2 sentinel nodes involved - she is s/p mastectomy with 3 node axillary evaluation and 5 years of [facility] thru 06-2013. No known active breast cancer      Past Medical History  Diagnosis Date  . Hemorrhoid   . Anemia   . Arthritis   . Stroke ([place])   . Diabetes mellitus without complication ([place])   . Hypertension   . Chronic kidney disease   . Thyroid disease     Past Surgical History  Procedure Laterality Date  . Breast lumpectomy      LEFT  . Abdominal hysterectomy      PARTIAL  . Mastectomy  05/2008    Left    Patient Care Team: Gildardo Cranker, DO as PCP - General (Internal Medicine) Doyce Loose, LCSW as Social Worker Gerlene Fee, NP as Nurse Practitioner (Geriatric Medicine) [facility] ([place])  Social History   Social History  . Marital Status: Single    Spouse Name: N/A  . Number of Children: N/A  . Years of Education: N/A   Occupational History  . Not on file.   Social History Main Topics  . Smoking status: Never Smoker   . Smokeless tobacco: Not on file  . Alcohol Use: No  . Drug Use: No  . Sexual Activity: Not on file   Other Topics Concern  . Not on  file   Social History Narrative     reports that she has never smoked. She does not have any smokeless tobacco history on file. She reports that she does not drink alcohol or use illicit drugs.  History reviewed. No pertinent family history. Unable to obtain due to dementia Family Status  Relation Status Death Age  . Mother Deceased   . Father Deceased   . Sister Alive   . Brother Deceased   . Sister Deceased     Immunization History  Administered Date(s) Administered  . Influenza,inj,Quad PF,36+ Mos 10/07/2012, 10/13/2013  . Influenza-Unspecified 10/12/2014  . Pneumococcal Conjugate-13 10/14/2010    Allergies  Allergen Reactions  . Aspirin Hives  . Penicillins Hives   Medications: Patient's Medications  New Prescriptions   No medications on  file  Previous Medications   ASCORBIC ACID (VITAMIN C) 1000 MG TABLET    Take 1,000 mg by mouth daily.   CHOLECALCIFEROL (VITAMIN D) 2000 UNITS TABLET    Take 2,000 Units by mouth daily.   CLOPIDOGREL (PLAVIX) 75 MG TABLET    Take 1 tablet (75 mg total) by mouth daily.   DONEPEZIL (ARICEPT) 5 MG TABLET    Take 5 mg by mouth at bedtime.    FERROUS SULFATE 325 (65 FE) MG TABLET    Take 325 mg by mouth 2 (two) times daily with a meal.   HYDROCODONE-ACETAMINOPHEN (NORCO) 10-325 MG PER TABLET    Take 1 tablet by mouth 2 (two) times daily.   LEVOTHYROXINE (SYNTHROID, LEVOTHROID) 112 MCG TABLET    Take 112 mcg by mouth daily before breakfast.   LISINOPRIL (PRINIVIL,ZESTRIL) 40 MG TABLET    Take 40 mg by mouth daily.   LORAZEPAM (ATIVAN) 0.5 MG TABLET    Give one tablet 1 time daily, give 1 tablet by mouth as needed two times daily   MAGNESIUM HYDROXIDE (MILK OF MAGNESIA) 400 MG/5ML SUSPENSION    Take 30 mLs by mouth daily as needed for mild constipation.   METOPROLOL TARTRATE (LOPRESSOR) 25 MG TABLET    Take 12.5 mg by mouth 2 (two) times daily.   NON FORMULARY    Magic cup by mouth daily with lunch   NUTRITIONAL SUPPLEMENTS (NUTRITIONAL SUPPLEMENT PO)    Take 120 mLs by mouth 4 (four) times daily. Puree texture, regular consistency   PAROXETINE (PAXIL) 40 MG TABLET    Take 40 mg by mouth daily.   POLLEN EXTRACTS (PROSTAT PO)    Take by mouth. 30 cc by mouth BID for albumin 2.7   POTASSIUM CHLORIDE (K-DUR) 10 MEQ TABLET    Take 10 mEq by mouth daily.   ROPINIROLE (REQUIP) 3 MG TABLET    Take 3 mg by mouth at bedtime.    VITAMIN B-12 1000 MCG TABLET    Take 1 tablet (1,000 mcg total) by mouth daily.  Modified Medications   No medications on file  Discontinued Medications   INSULIN LISPRO (HUMALOG) 100 UNIT/ML INJECTION    Inject 5 Units into the skin 3 (three) times daily. Reported on 08/06/2015   LORAZEPAM (ATIVAN) 0.5 MG TABLET    Take 1 tablet (0.5 mg total) by mouth every 8 (eight) hours as needed  for anxiety.    Review of Systems  Unable to perform ROS: Dementia    Filed Vitals:   08/06/15 1245  BP: 170/99  Pulse: 64  Temp: 97.9 F (36.6 C)  TempSrc: Oral  Resp: 18  Height: 5\' 1"  (1.549 m)  Weight: 184  lb 6.4 oz (83.643 kg)  SpO2: 97%   Body mass index is 34.86 kg/(m^2).  Physical Exam  Constitutional: She appears well-developed.  Sitting in w/c in NAD, frail appearing  HENT:  Mouth/Throat: Oropharynx is clear and moist. No oropharyngeal exudate.  MMM. No tongue lesions  Eyes: Pupils are equal, round, and reactive to light. No scleral icterus.  Neck: Neck supple. Carotid bruit is not present. No tracheal deviation present. No thyromegaly present.  Cardiovascular: Normal rate, regular rhythm and intact distal pulses.  Exam reveals no gallop and no friction rub.   Murmur (1/6 SEM) heard. No LE edema b/l. no calf TTP.   Pulmonary/Chest: Effort normal and breath sounds normal. No stridor. No respiratory distress. She has no wheezes. She has no rales.  Abdominal: Soft. Bowel sounds are normal. She exhibits no distension and no mass. There is no hepatomegaly. There is no tenderness. There is no rebound and no guarding.  Musculoskeletal: She exhibits edema.  (+) contractures  Lymphadenopathy:    She has no cervical adenopathy.  Neurological: She is alert. She displays tremor (resting).  Skin: Skin is warm and dry. No rash noted.  Psychiatric: She has a normal mood and affect. Her behavior is normal.     Labs reviewed: Nursing Home on 06/10/2015  Component Date Value Ref Range Status  . Microalb, Ur 05/24/2015 23.6   Final  . Hemoglobin 05/24/2015 8.3* 12.0 - 16.0 g/dL Final  . HCT 05/24/2015 27* 36 - 46 % Final  . Platelets 05/24/2015 200  150 - 399 K/L Final  . WBC 05/24/2015 5.0   Final  . Triglycerides 05/24/2015 75  40 - 160 mg/dL Final  . Cholesterol 05/24/2015 111  0 - 200 mg/dL Final  . HDL 05/24/2015 45  35 - 70 mg/dL Final  . LDL Cholesterol  05/24/2015 51   Final  . Alkaline Phosphatase 05/24/2015 86  25 - 125 U/L Final  . ALT 05/24/2015 17  7 - 35 U/L Final  . AST 05/24/2015 17  13 - 35 U/L Final  . Hemoglobin A1C 05/24/2015 5.5   Final  . TSH 05/24/2015 4.38  0.41 - 5.90 uIU/mL Final  . Glucose 05/24/2015 95   Final  . BUN 05/24/2015 35* 4 - 21 mg/dL Final  . Creatinine 05/24/2015 1.4* 0.5 - 1.1 mg/dL Final  . Potassium 05/24/2015 4.2  3.4 - 5.3 mmol/L Final  . Sodium 05/24/2015 140  137 - 147 mmol/L Final  Nursing Home on 05/09/2015  Component Date Value Ref Range Status  . Glucose 04/17/2015 84   Final  . BUN 04/17/2015 21  4 - 21 mg/dL Final  . Creatinine 04/17/2015 1.2* 0.5 - 1.1 mg/dL Final  . Potassium 04/17/2015 5.1  3.4 - 5.3 mmol/L Final  . Sodium 04/17/2015 139  137 - 147 mmol/L Final    No results found.   Assessment/Plan   ICD-9-CM ICD-10-CM   1. Dementia without behavioral disturbance 294.20 F03.90   2. Iron deficiency anemia due to chronic blood loss 280.0 D50.0   3. Essential hypertension 401.9 I10   4. Controlled type 2 diabetes mellitus with stage 3 chronic kidney disease, with long-term current use of insulin (HCC) 250.40 E11.22    585.3 N18.3    V58.67 Z79.4   5. Hemiparesis affecting left side as late effect of cerebrovascular accident ([place]) 438.20 I69.354   6. Hypothyroidism due to acquired atrophy of thyroid 244.8 E03.8    246.8 E03.4   7. History of left  breast cancer V10.3 Z85.3     Cont current meds as ordered.   Follow VS closely and adjust BP meds accordingly  Follow weight weekly x 4 then reassess  Cont nutritional supplements as indicated  PT/OT/ST as indicated  F/u with specialists as indicated  Will follow  Calissa Swenor S. Perlie Gold  [facility] [address] [place],  [address] [phone] Cell (Monday-Friday 8 AM - 5 PM) [phone] After 5 PM and follow prompts

## 2015-08-07 DIAGNOSIS — E034 Atrophy of thyroid (acquired): Secondary | ICD-10-CM | POA: Insufficient documentation

## 2015-08-23 DIAGNOSIS — F419 Anxiety disorder, unspecified: Secondary | ICD-10-CM | POA: Diagnosis not present

## 2015-08-23 DIAGNOSIS — F0391 Unspecified dementia with behavioral disturbance: Secondary | ICD-10-CM | POA: Diagnosis not present

## 2015-08-26 DIAGNOSIS — N390 Urinary tract infection, site not specified: Secondary | ICD-10-CM | POA: Diagnosis not present

## 2015-08-26 DIAGNOSIS — E118 Type 2 diabetes mellitus with unspecified complications: Secondary | ICD-10-CM | POA: Diagnosis not present

## 2015-08-31 DIAGNOSIS — D631 Anemia in chronic kidney disease: Secondary | ICD-10-CM | POA: Diagnosis not present

## 2015-08-31 LAB — CBC AND DIFFERENTIAL
HCT: 35 % — AB (ref 36–46)
Hemoglobin: 11.1 g/dL — AB (ref 12.0–16.0)
PLATELETS: 275 10*3/uL (ref 150–399)
WBC: 9.7 10^3/mL

## 2015-08-31 LAB — BASIC METABOLIC PANEL
BUN: 31 mg/dL — AB (ref 4–21)
CREATININE: 1.6 mg/dL — AB (ref 0.5–1.1)
GLUCOSE: 210 mg/dL
Potassium: 3.6 mmol/L (ref 3.4–5.3)
Sodium: 142 mmol/L (ref 137–147)

## 2015-09-04 DIAGNOSIS — B351 Tinea unguium: Secondary | ICD-10-CM | POA: Diagnosis not present

## 2015-09-04 DIAGNOSIS — E119 Type 2 diabetes mellitus without complications: Secondary | ICD-10-CM | POA: Diagnosis not present

## 2015-09-04 DIAGNOSIS — M79672 Pain in left foot: Secondary | ICD-10-CM | POA: Diagnosis not present

## 2015-09-04 DIAGNOSIS — M79671 Pain in right foot: Secondary | ICD-10-CM | POA: Diagnosis not present

## 2015-09-05 ENCOUNTER — Non-Acute Institutional Stay (SKILLED_NURSING_FACILITY): Payer: Medicare Other | Admitting: Adult Health

## 2015-09-05 DIAGNOSIS — I69354 Hemiplegia and hemiparesis following cerebral infarction affecting left non-dominant side: Secondary | ICD-10-CM

## 2015-09-05 DIAGNOSIS — E038 Other specified hypothyroidism: Secondary | ICD-10-CM

## 2015-09-05 DIAGNOSIS — N189 Chronic kidney disease, unspecified: Secondary | ICD-10-CM

## 2015-09-05 DIAGNOSIS — I10 Essential (primary) hypertension: Secondary | ICD-10-CM | POA: Diagnosis not present

## 2015-09-05 DIAGNOSIS — C50912 Malignant neoplasm of unspecified site of left female breast: Secondary | ICD-10-CM | POA: Diagnosis not present

## 2015-09-05 DIAGNOSIS — N183 Chronic kidney disease, stage 3 unspecified: Secondary | ICD-10-CM

## 2015-09-05 DIAGNOSIS — E034 Atrophy of thyroid (acquired): Secondary | ICD-10-CM

## 2015-09-05 DIAGNOSIS — E1122 Type 2 diabetes mellitus with diabetic chronic kidney disease: Secondary | ICD-10-CM | POA: Diagnosis not present

## 2015-09-05 DIAGNOSIS — I6330 Cerebral infarction due to thrombosis of unspecified cerebral artery: Secondary | ICD-10-CM

## 2015-09-05 DIAGNOSIS — D631 Anemia in chronic kidney disease: Secondary | ICD-10-CM | POA: Diagnosis not present

## 2015-09-06 DIAGNOSIS — E039 Hypothyroidism, unspecified: Secondary | ICD-10-CM | POA: Diagnosis not present

## 2015-09-18 DIAGNOSIS — E039 Hypothyroidism, unspecified: Secondary | ICD-10-CM | POA: Diagnosis not present

## 2015-09-20 DIAGNOSIS — R1312 Dysphagia, oropharyngeal phase: Secondary | ICD-10-CM | POA: Diagnosis not present

## 2015-09-20 DIAGNOSIS — I69859 Hemiplegia and hemiparesis following other cerebrovascular disease affecting unspecified side: Secondary | ICD-10-CM | POA: Diagnosis not present

## 2015-09-20 DIAGNOSIS — F339 Major depressive disorder, recurrent, unspecified: Secondary | ICD-10-CM | POA: Diagnosis not present

## 2015-09-20 DIAGNOSIS — E1122 Type 2 diabetes mellitus with diabetic chronic kidney disease: Secondary | ICD-10-CM | POA: Diagnosis not present

## 2015-09-20 DIAGNOSIS — G40501 Epileptic seizures related to external causes, not intractable, with status epilepticus: Secondary | ICD-10-CM | POA: Diagnosis not present

## 2015-09-20 DIAGNOSIS — I639 Cerebral infarction, unspecified: Secondary | ICD-10-CM | POA: Diagnosis not present

## 2015-09-20 DIAGNOSIS — E162 Hypoglycemia, unspecified: Secondary | ICD-10-CM | POA: Diagnosis not present

## 2015-09-20 DIAGNOSIS — R489 Unspecified symbolic dysfunctions: Secondary | ICD-10-CM | POA: Diagnosis not present

## 2015-09-20 DIAGNOSIS — M6281 Muscle weakness (generalized): Secondary | ICD-10-CM | POA: Diagnosis not present

## 2015-09-24 DIAGNOSIS — G40501 Epileptic seizures related to external causes, not intractable, with status epilepticus: Secondary | ICD-10-CM | POA: Diagnosis not present

## 2015-09-24 DIAGNOSIS — I639 Cerebral infarction, unspecified: Secondary | ICD-10-CM | POA: Diagnosis not present

## 2015-09-24 DIAGNOSIS — E162 Hypoglycemia, unspecified: Secondary | ICD-10-CM | POA: Diagnosis not present

## 2015-09-24 DIAGNOSIS — M6281 Muscle weakness (generalized): Secondary | ICD-10-CM | POA: Diagnosis not present

## 2015-09-24 DIAGNOSIS — I69859 Hemiplegia and hemiparesis following other cerebrovascular disease affecting unspecified side: Secondary | ICD-10-CM | POA: Diagnosis not present

## 2015-09-24 DIAGNOSIS — E1122 Type 2 diabetes mellitus with diabetic chronic kidney disease: Secondary | ICD-10-CM | POA: Diagnosis not present

## 2015-09-25 DIAGNOSIS — E162 Hypoglycemia, unspecified: Secondary | ICD-10-CM | POA: Diagnosis not present

## 2015-09-25 DIAGNOSIS — M6281 Muscle weakness (generalized): Secondary | ICD-10-CM | POA: Diagnosis not present

## 2015-09-25 DIAGNOSIS — G40501 Epileptic seizures related to external causes, not intractable, with status epilepticus: Secondary | ICD-10-CM | POA: Diagnosis not present

## 2015-09-25 DIAGNOSIS — I639 Cerebral infarction, unspecified: Secondary | ICD-10-CM | POA: Diagnosis not present

## 2015-09-25 DIAGNOSIS — I69859 Hemiplegia and hemiparesis following other cerebrovascular disease affecting unspecified side: Secondary | ICD-10-CM | POA: Diagnosis not present

## 2015-09-25 DIAGNOSIS — E1122 Type 2 diabetes mellitus with diabetic chronic kidney disease: Secondary | ICD-10-CM | POA: Diagnosis not present

## 2015-09-26 ENCOUNTER — Encounter: Payer: Self-pay | Admitting: Adult Health

## 2015-09-26 ENCOUNTER — Non-Acute Institutional Stay (SKILLED_NURSING_FACILITY): Payer: Medicare Other | Admitting: Adult Health

## 2015-09-26 DIAGNOSIS — M6281 Muscle weakness (generalized): Secondary | ICD-10-CM | POA: Diagnosis not present

## 2015-09-26 DIAGNOSIS — I639 Cerebral infarction, unspecified: Secondary | ICD-10-CM | POA: Diagnosis not present

## 2015-09-26 DIAGNOSIS — E162 Hypoglycemia, unspecified: Secondary | ICD-10-CM | POA: Diagnosis not present

## 2015-09-26 DIAGNOSIS — G40501 Epileptic seizures related to external causes, not intractable, with status epilepticus: Secondary | ICD-10-CM | POA: Diagnosis not present

## 2015-09-26 DIAGNOSIS — E1122 Type 2 diabetes mellitus with diabetic chronic kidney disease: Secondary | ICD-10-CM | POA: Diagnosis not present

## 2015-09-26 DIAGNOSIS — E034 Atrophy of thyroid (acquired): Secondary | ICD-10-CM

## 2015-09-26 DIAGNOSIS — E038 Other specified hypothyroidism: Secondary | ICD-10-CM | POA: Diagnosis not present

## 2015-09-26 DIAGNOSIS — I69859 Hemiplegia and hemiparesis following other cerebrovascular disease affecting unspecified side: Secondary | ICD-10-CM | POA: Diagnosis not present

## 2015-09-26 NOTE — Progress Notes (Signed)
Patient ID: [name], female   DOB: 03-Oct-1930, 82 y.o.   MRN: [id number]   Location:   [place] Room Number: 117-A Place of Service:  SNF (31)   CODE STATUS: Full Code  Allergies  Allergen Reactions  . Aspirin Hives  . Penicillins Hives    Chief Complaint  Patient presents with  . Acute Visit    Lab review    HPI:  Her tsh is significantly elevated at 97.79. She is unable to fully participate in the hpi or ros. She did tell me that she doing ok. There are no nursing concerns at this time. Her previous tsh was 4.38.    Past Medical History:  Diagnosis Date  . Anemia   . Arthritis   . Chronic kidney disease   . Diabetes mellitus without complication ([place])   . Hemorrhoid   . Hypertension   . Stroke ([place])   . Thyroid disease     Past Surgical History:  Procedure Laterality Date  . ABDOMINAL HYSTERECTOMY     PARTIAL  . BREAST LUMPECTOMY     LEFT  . MASTECTOMY  05/2008   Left    Social History   Social History  . Marital status: Single    Spouse name: N/A  . Number of children: N/A  . Years of education: N/A   Occupational History  . Not on file.   Social History Main Topics  . Smoking status: Never Smoker  . Smokeless tobacco: Not on file  . Alcohol use No  . Drug use: No  . Sexual activity: Not on file   Other Topics Concern  . Not on file   Social History Narrative  . No narrative on file   History reviewed. No pertinent family history.    VITAL SIGNS BP 133/64   Pulse 70   Temp 98.2 F (36.8 C) (Oral)   Resp 19   Ht 5\' 1"  (1.549 m)   Wt 164 lb 2 oz (74.4 kg)   SpO2 97%   BMI 31.01 kg/m   Patient's Medications  New Prescriptions   No medications on file  Previous Medications   ASCORBIC ACID (VITAMIN C) 1000 MG TABLET    Take 1,000 mg by mouth daily.   CHOLECALCIFEROL (VITAMIN D) 2000 UNITS TABLET    Take 2,000 Units by mouth daily.   CLOPIDOGREL (PLAVIX) 75 MG TABLET    Take 1 tablet (75 mg total) by mouth  daily.   DONEPEZIL (ARICEPT) 5 MG TABLET    Take 5 mg by mouth at bedtime.    FERROUS SULFATE 325 (65 FE) MG TABLET    Take 325 mg by mouth 2 (two) times daily with a meal.   HYDROCODONE-ACETAMINOPHEN (NORCO) 10-325 MG PER TABLET    Take 1 tablet by mouth 2 (two) times daily.   LEVOTHYROXINE (SYNTHROID, LEVOTHROID) 112 MCG TABLET    Take 112 mcg by mouth daily before breakfast.   LISINOPRIL (PRINIVIL,ZESTRIL) 40 MG TABLET    Take 40 mg by mouth daily.   LORAZEPAM (ATIVAN) 0.5 MG TABLET    Give one tablet 1 time daily, give 1 tablet by mouth as needed two times daily   MAGNESIUM HYDROXIDE (MILK OF MAGNESIA) 400 MG/5ML SUSPENSION    Take 30 mLs by mouth daily as needed for mild constipation.   METOPROLOL TARTRATE (LOPRESSOR) 25 MG TABLET    Take 12.5 mg by mouth 2 (two) times daily.   NUTRITIONAL SUPPLEMENTS (NUTRITIONAL SUPPLEMENT PO)  Take 120 mLs by mouth 4 (four) times daily. Puree texture, regular consistency   PAROXETINE (PAXIL) 40 MG TABLET    Take 40 mg by mouth daily.   POLLEN EXTRACTS (PROSTAT PO)    Take by mouth. 30 cc by mouth BID for albumin 2.7   POTASSIUM CHLORIDE (K-DUR) 10 MEQ TABLET    Take 10 mEq by mouth daily.   ROPINIROLE (REQUIP) 3 MG TABLET    Take 3 mg by mouth at bedtime.    VITAMIN B-12 1000 MCG TABLET    Take 1 tablet (1,000 mcg total) by mouth daily.  Modified Medications   No medications on file  Discontinued Medications   NON FORMULARY    Magic cup by mouth daily with lunch     SIGNIFICANT DIAGNOSTIC EXAMS  09-20-14: wbc 6.2; hgb 9.1; hct 29.1; mcv 78.0; plt 319; glucose 108; bun 15; creat 0.82; k+4.1; na++134; liver normal albumin 2.3  09-26-14: wbc 5.2; hgb 9.8; hct 31.7; mcv 76.4; plt 353; glucose 129; bun 14; creat 0.79; k+ 4.3; na++139; liver normal albumin 2.7 10-24-14: hgb a1c 6.7 11-05-14 glucose 126; bun 10; creat 0.71; k+ 3.4; na++142 11-08-14: wbc 8.7; hgb 9.0; hct 29.0; mcv 77.1; plt 295; iron 22; tibc 115  11-12-14: glucose 135; bun 9; creat 0.75;  k+ 3.2; na++144  01-25-15: glucose 87; bun 19; creat 1.45; k+ 4.8; na++141; liver normal albumin 2.7  04-11-15: glucose 108; bun 22; creat 1.40; k+ 5.5; na++140  04-17-15; glucose 84; bun 21; creat 1.22; k+ 5.1; na++139  05-24-15: wbc 5.0; hgb 8.3; hct 27.1; mcv 79.2; plt 200; glucose 95; bun 35; creat 1.36; k+ 4.2; na++ 140 liver normal albumin 2.9; chol 111; ldl 51; trig 75; hdl 45; tsh 4.38; hgb a1c 5.5; urine micro-albumin 23.6  09-18-15: tsh 97.79; free T3: 1.5; free T4: 0.6      Review of Systems  Unable to perform ROS: Other      Physical Exam  Constitutional: No distress.  Eyes: Conjunctivae are normal.  Neck: Neck supple. No JVD present. No thyromegaly present.  Cardiovascular: Normal rate, regular rhythm and intact distal pulses.  S3 S4 split  Respiratory: Effort normal and breath sounds normal. No respiratory distress. She has no wheezes.  GI: Soft. Bowel sounds are normal. She exhibits no distension. There is no tenderness.  Musculoskeletal: She exhibits no edema.  Left hemiparesis    Lymphadenopathy:    She has no cervical adenopathy.  Neurological: She is alert.  Skin: Skin is warm and dry. She is not diaphoretic.  Has profon boots to both feet   Psychiatric: She has a normal mood and affect.     ASSESSMENT/ PLAN:  2. Hypothyroidism: will continue synthroid 112 mcg daily in 4 weeks will recheck her thyroid labs.    Time spent with patient  30   minutes >50% time spent counseling; reviewing medical record; tests; labs; and developing future plan of care   MD is aware of resident's narcotic use and is in agreement with current plan of care. We will attempt to wean resident as apropriate   Ok Edwards NP [facility]  Contact [phone] Monday through Friday 8am- 5pm  After hours call [phone]

## 2015-09-27 DIAGNOSIS — I639 Cerebral infarction, unspecified: Secondary | ICD-10-CM | POA: Diagnosis not present

## 2015-09-27 DIAGNOSIS — M6281 Muscle weakness (generalized): Secondary | ICD-10-CM | POA: Diagnosis not present

## 2015-09-27 DIAGNOSIS — I69859 Hemiplegia and hemiparesis following other cerebrovascular disease affecting unspecified side: Secondary | ICD-10-CM | POA: Diagnosis not present

## 2015-09-27 DIAGNOSIS — E162 Hypoglycemia, unspecified: Secondary | ICD-10-CM | POA: Diagnosis not present

## 2015-09-27 DIAGNOSIS — E1122 Type 2 diabetes mellitus with diabetic chronic kidney disease: Secondary | ICD-10-CM | POA: Diagnosis not present

## 2015-09-27 DIAGNOSIS — G40501 Epileptic seizures related to external causes, not intractable, with status epilepticus: Secondary | ICD-10-CM | POA: Diagnosis not present

## 2015-09-28 DIAGNOSIS — I639 Cerebral infarction, unspecified: Secondary | ICD-10-CM | POA: Diagnosis not present

## 2015-09-28 DIAGNOSIS — I69859 Hemiplegia and hemiparesis following other cerebrovascular disease affecting unspecified side: Secondary | ICD-10-CM | POA: Diagnosis not present

## 2015-09-28 DIAGNOSIS — G40501 Epileptic seizures related to external causes, not intractable, with status epilepticus: Secondary | ICD-10-CM | POA: Diagnosis not present

## 2015-09-28 DIAGNOSIS — E162 Hypoglycemia, unspecified: Secondary | ICD-10-CM | POA: Diagnosis not present

## 2015-09-28 DIAGNOSIS — E1122 Type 2 diabetes mellitus with diabetic chronic kidney disease: Secondary | ICD-10-CM | POA: Diagnosis not present

## 2015-09-28 DIAGNOSIS — M6281 Muscle weakness (generalized): Secondary | ICD-10-CM | POA: Diagnosis not present

## 2015-09-29 DIAGNOSIS — E162 Hypoglycemia, unspecified: Secondary | ICD-10-CM | POA: Diagnosis not present

## 2015-09-29 DIAGNOSIS — I639 Cerebral infarction, unspecified: Secondary | ICD-10-CM | POA: Diagnosis not present

## 2015-09-29 DIAGNOSIS — M6281 Muscle weakness (generalized): Secondary | ICD-10-CM | POA: Diagnosis not present

## 2015-09-29 DIAGNOSIS — G40501 Epileptic seizures related to external causes, not intractable, with status epilepticus: Secondary | ICD-10-CM | POA: Diagnosis not present

## 2015-09-29 DIAGNOSIS — I69859 Hemiplegia and hemiparesis following other cerebrovascular disease affecting unspecified side: Secondary | ICD-10-CM | POA: Diagnosis not present

## 2015-09-29 DIAGNOSIS — E1122 Type 2 diabetes mellitus with diabetic chronic kidney disease: Secondary | ICD-10-CM | POA: Diagnosis not present

## 2015-09-30 DIAGNOSIS — I639 Cerebral infarction, unspecified: Secondary | ICD-10-CM | POA: Diagnosis not present

## 2015-09-30 DIAGNOSIS — E162 Hypoglycemia, unspecified: Secondary | ICD-10-CM | POA: Diagnosis not present

## 2015-09-30 DIAGNOSIS — G40501 Epileptic seizures related to external causes, not intractable, with status epilepticus: Secondary | ICD-10-CM | POA: Diagnosis not present

## 2015-09-30 DIAGNOSIS — M6281 Muscle weakness (generalized): Secondary | ICD-10-CM | POA: Diagnosis not present

## 2015-09-30 DIAGNOSIS — E1122 Type 2 diabetes mellitus with diabetic chronic kidney disease: Secondary | ICD-10-CM | POA: Diagnosis not present

## 2015-09-30 DIAGNOSIS — I69859 Hemiplegia and hemiparesis following other cerebrovascular disease affecting unspecified side: Secondary | ICD-10-CM | POA: Diagnosis not present

## 2015-10-01 DIAGNOSIS — I639 Cerebral infarction, unspecified: Secondary | ICD-10-CM | POA: Diagnosis not present

## 2015-10-01 DIAGNOSIS — E162 Hypoglycemia, unspecified: Secondary | ICD-10-CM | POA: Diagnosis not present

## 2015-10-01 DIAGNOSIS — E1122 Type 2 diabetes mellitus with diabetic chronic kidney disease: Secondary | ICD-10-CM | POA: Diagnosis not present

## 2015-10-01 DIAGNOSIS — I69859 Hemiplegia and hemiparesis following other cerebrovascular disease affecting unspecified side: Secondary | ICD-10-CM | POA: Diagnosis not present

## 2015-10-01 DIAGNOSIS — G40501 Epileptic seizures related to external causes, not intractable, with status epilepticus: Secondary | ICD-10-CM | POA: Diagnosis not present

## 2015-10-01 DIAGNOSIS — M6281 Muscle weakness (generalized): Secondary | ICD-10-CM | POA: Diagnosis not present

## 2015-10-02 DIAGNOSIS — G40501 Epileptic seizures related to external causes, not intractable, with status epilepticus: Secondary | ICD-10-CM | POA: Diagnosis not present

## 2015-10-02 DIAGNOSIS — I69859 Hemiplegia and hemiparesis following other cerebrovascular disease affecting unspecified side: Secondary | ICD-10-CM | POA: Diagnosis not present

## 2015-10-02 DIAGNOSIS — I639 Cerebral infarction, unspecified: Secondary | ICD-10-CM | POA: Diagnosis not present

## 2015-10-02 DIAGNOSIS — E162 Hypoglycemia, unspecified: Secondary | ICD-10-CM | POA: Diagnosis not present

## 2015-10-02 DIAGNOSIS — E1122 Type 2 diabetes mellitus with diabetic chronic kidney disease: Secondary | ICD-10-CM | POA: Diagnosis not present

## 2015-10-02 DIAGNOSIS — M6281 Muscle weakness (generalized): Secondary | ICD-10-CM | POA: Diagnosis not present

## 2015-10-03 DIAGNOSIS — E1122 Type 2 diabetes mellitus with diabetic chronic kidney disease: Secondary | ICD-10-CM | POA: Diagnosis not present

## 2015-10-03 DIAGNOSIS — G40501 Epileptic seizures related to external causes, not intractable, with status epilepticus: Secondary | ICD-10-CM | POA: Diagnosis not present

## 2015-10-03 DIAGNOSIS — M6281 Muscle weakness (generalized): Secondary | ICD-10-CM | POA: Diagnosis not present

## 2015-10-03 DIAGNOSIS — I639 Cerebral infarction, unspecified: Secondary | ICD-10-CM | POA: Diagnosis not present

## 2015-10-03 DIAGNOSIS — E162 Hypoglycemia, unspecified: Secondary | ICD-10-CM | POA: Diagnosis not present

## 2015-10-03 DIAGNOSIS — I69859 Hemiplegia and hemiparesis following other cerebrovascular disease affecting unspecified side: Secondary | ICD-10-CM | POA: Diagnosis not present

## 2015-10-05 ENCOUNTER — Encounter: Payer: Self-pay | Admitting: Adult Health

## 2015-10-05 NOTE — Progress Notes (Signed)
Location:   [facility] of Service:  SNF (31)   CODE STATUS: full code   Allergies  Allergen Reactions  . Aspirin Hives  . Penicillins Hives    Chief Complaint  Patient presents with  . Medical Management of Chronic Issues    HPI:  She is a long term resident of this facility being seen for the management of her chronic illnesses. Overall there is little change in her status. She is not voicing any complaints today. There are no nursing concerns at this time.    Past Medical History:  Diagnosis Date  . Anemia   . Arthritis   . Chronic kidney disease   . Diabetes mellitus without complication ([place])   . Hemorrhoid   . Hypertension   . Stroke ([place])   . Thyroid disease     Past Surgical History:  Procedure Laterality Date  . ABDOMINAL HYSTERECTOMY     PARTIAL  . BREAST LUMPECTOMY     LEFT  . MASTECTOMY  05/2008   Left    Social History   Social History  . Marital status: Single    Spouse name: N/A  . Number of children: N/A  . Years of education: N/A   Occupational History  . Not on file.   Social History Main Topics  . Smoking status: Never Smoker  . Smokeless tobacco: Not on file  . Alcohol use No  . Drug use: No  . Sexual activity: Not on file   Other Topics Concern  . Not on file   Social History Narrative  . No narrative on file   No family history on file.    VITAL SIGNS BP (!) 148/70   Pulse 60   Ht 5\' 1"  (1.549 m)   Wt 183 lb 8 oz (83.2 kg)   SpO2 95%   BMI 34.67 kg/m   Patient's Medications  New Prescriptions   No medications on file  Previous Medications   ASCORBIC ACID (VITAMIN C) 1000 MG TABLET    Take 1,000 mg by mouth daily.   CHOLECALCIFEROL (VITAMIN D) 2000 UNITS TABLET    Take 2,000 Units by mouth daily.   CLOPIDOGREL (PLAVIX) 75 MG TABLET    Take 1 tablet (75 mg total) by mouth daily.   DONEPEZIL (ARICEPT) 5 MG TABLET    Take 5 mg by mouth at bedtime.    FERROUS SULFATE 325 (65 FE) MG TABLET    Take  325 mg by mouth 2 (two) times daily with a meal.   HYDROCODONE-ACETAMINOPHEN (NORCO) 10-325 MG PER TABLET    Take 1 tablet by mouth 2 (two) times daily.   LEVOTHYROXINE (SYNTHROID, LEVOTHROID) 112 MCG TABLET    Take 112 mcg by mouth daily before breakfast.   LISINOPRIL (PRINIVIL,ZESTRIL) 40 MG TABLET    Take 40 mg by mouth daily.   LORAZEPAM (ATIVAN) 0.5 MG TABLET    Give one tablet 1 time daily, give 1 tablet by mouth as needed two times daily   MAGNESIUM HYDROXIDE (MILK OF MAGNESIA) 400 MG/5ML SUSPENSION    Take 30 mLs by mouth daily as needed for mild constipation.   METOPROLOL TARTRATE (LOPRESSOR) 25 MG TABLET    Take 12.5 mg by mouth 2 (two) times daily.   NUTRITIONAL SUPPLEMENTS (NUTRITIONAL SUPPLEMENT PO)    Take 120 mLs by mouth 4 (four) times daily. Puree texture, regular consistency   PAROXETINE (PAXIL) 40 MG TABLET    Take 40 mg by mouth  daily.   POLLEN EXTRACTS (PROSTAT PO)    Take by mouth. 30 cc by mouth BID for albumin 2.7   POTASSIUM CHLORIDE (K-DUR) 10 MEQ TABLET    Take 10 mEq by mouth daily.   ROPINIROLE (REQUIP) 3 MG TABLET    Take 3 mg by mouth at bedtime.    VITAMIN B-12 1000 MCG TABLET    Take 1 tablet (1,000 mcg total) by mouth daily.  Modified Medications   No medications on file  Discontinued Medications   No medications on file     SIGNIFICANT DIAGNOSTIC EXAMS   09-19-14: left knee x-ray: Severe knee osteoarthritis with lateral and posterior tibial translation.  09-19-14: bilateral hip and pelvis x-ray: 1. Mild degenerative changes within both hips. 2. No acute abnormality. 3. Degenerative changes in the lumbar spine. 4. Atherosclerosis.  03-25-15: chest x-ray: no acute cardiopulmonary disease; no pneumonia    LABS REVIEWED:   09-20-14: wbc 6.2; hgb 9.1; hct 29.1; mcv 78.0; plt 319; glucose 108; bun 15; creat 0.82; k+4.1; na++134; liver normal albumin 2.3  09-26-14: wbc 5.2; hgb 9.8; hct 31.7; mcv 76.4; plt 353; glucose 129; bun 14; creat 0.79; k+ 4.3; na++139;  liver normal albumin 2.7 10-24-14: hgb a1c 6.7 11-05-14 glucose 126; bun 10; creat 0.71; k+ 3.4; na++142 11-08-14: wbc 8.7; hgb 9.0; hct 29.0; mcv 77.1; plt 295; iron 22; tibc 115  11-12-14: glucose 135; bun 9; creat 0.75; k+ 3.2; na++144  01-25-15: glucose 87; bun 19; creat 1.45; k+ 4.8; na++141; liver normal albumin 2.7  04-11-15: glucose 108; bun 22; creat 1.40; k+ 5.5; na++140  04-17-15; glucose 84; bun 21; creat 1.22; k+ 5.1; na++139  05-24-15: wbc 5.0; hgb 8.3; hct 27.1; mcv 79.2; plt 200; glucose 95; bun 35; creat 1.36; k+ 4.2; na++ 140 liver normal albumin 2.9; chol 111; ldl 51; trig 75; hdl 45; tsh 4.38; hgb a1c 5.5; urine micro-albumin 23.6  08-31-15: wbc 9.7; hgb 11.1; hct 35.2; mcv 78.6 plt 275 glucose 210; bun 31; creat 1.57; k+ 3.6; na++ 141     Review of Systems  Unable to perform ROS: Other      Physical Exam  Constitutional: No distress.  Eyes: Conjunctivae are normal.  Neck: Neck supple. No JVD present. No thyromegaly present.  Cardiovascular: Normal rate, regular rhythm and intact distal pulses.  S3 S4 split  Respiratory: Effort normal and breath sounds normal. No respiratory distress. She has no wheezes.  GI: Soft. Bowel sounds are normal. She exhibits no distension. There is no tenderness.  Musculoskeletal: She exhibits no edema.  Left hemiparesis    Lymphadenopathy:    She has no cervical adenopathy.  Neurological: She is alert.  Skin: Skin is warm and dry. She is not diaphoretic.  Psychiatric: She has a normal mood and affect.     ASSESSMENT/ PLAN:  1. CVA: with left hemiparesis: is neurologically stable; will continue plavix 75 mg daily takes vicodin 10/325 mg twice daily for pain management and will monitor   2. Hypothyroidism: will continue synthroid 112 mcg daily and will monitor her free t4: 1.17; tsh 4.38    Will check her thyroid labs.   3. Anemia: is presently stable will continue iron twice daily; vit b12 1000 mcg daily  hgb 8.3 iron level is up to 22  from  13; will monitor  4. Diabetes:  hgb is 5.5  Will not make changes will monitor her status.   5. Hypertension: her blood pressure is elevated will continue  lopressor 12.5  mg twice daily  lisinopril  40 mg daily   6. Restless leg syndrome: will continue requip  3 mg nightly   7.  Depression with anxiety: will continue paxil 40 mg daily has ativan 0.5 mg three times daily as needed will monitor   8. ckd stage III:  Her bun/creat is 35/1.36; will monitor   9. Hypokalemia: will continue  k+ to 10 meq daily  K+ is 4.2   10. Left breast cancer T1N1 ER/PR +, HER 2 negative, 2 sentinel nodes involved:  post mastectomy with 3 node axillary evaluation and 5 years of [facility] thru 06-2013. No known active breast cancer Will monitor        ASSESSMENT/ PLAN:    MD is aware of resident's narcotic use and is in agreement with current plan of care. We will attempt to wean resident as apropriate   Ok Edwards NP [facility]  Contact [phone] Monday through Friday 8am- 5pm  After hours call [phone]

## 2015-10-07 DIAGNOSIS — G40501 Epileptic seizures related to external causes, not intractable, with status epilepticus: Secondary | ICD-10-CM | POA: Diagnosis not present

## 2015-10-07 DIAGNOSIS — E1122 Type 2 diabetes mellitus with diabetic chronic kidney disease: Secondary | ICD-10-CM | POA: Diagnosis not present

## 2015-10-07 DIAGNOSIS — M6281 Muscle weakness (generalized): Secondary | ICD-10-CM | POA: Diagnosis not present

## 2015-10-07 DIAGNOSIS — E162 Hypoglycemia, unspecified: Secondary | ICD-10-CM | POA: Diagnosis not present

## 2015-10-07 DIAGNOSIS — I639 Cerebral infarction, unspecified: Secondary | ICD-10-CM | POA: Diagnosis not present

## 2015-10-07 DIAGNOSIS — I69859 Hemiplegia and hemiparesis following other cerebrovascular disease affecting unspecified side: Secondary | ICD-10-CM | POA: Diagnosis not present

## 2015-10-08 DIAGNOSIS — M6281 Muscle weakness (generalized): Secondary | ICD-10-CM | POA: Diagnosis not present

## 2015-10-08 DIAGNOSIS — I69859 Hemiplegia and hemiparesis following other cerebrovascular disease affecting unspecified side: Secondary | ICD-10-CM | POA: Diagnosis not present

## 2015-10-08 DIAGNOSIS — I639 Cerebral infarction, unspecified: Secondary | ICD-10-CM | POA: Diagnosis not present

## 2015-10-08 DIAGNOSIS — E1122 Type 2 diabetes mellitus with diabetic chronic kidney disease: Secondary | ICD-10-CM | POA: Diagnosis not present

## 2015-10-08 DIAGNOSIS — G40501 Epileptic seizures related to external causes, not intractable, with status epilepticus: Secondary | ICD-10-CM | POA: Diagnosis not present

## 2015-10-08 DIAGNOSIS — E162 Hypoglycemia, unspecified: Secondary | ICD-10-CM | POA: Diagnosis not present

## 2015-10-09 DIAGNOSIS — G40501 Epileptic seizures related to external causes, not intractable, with status epilepticus: Secondary | ICD-10-CM | POA: Diagnosis not present

## 2015-10-09 DIAGNOSIS — I69859 Hemiplegia and hemiparesis following other cerebrovascular disease affecting unspecified side: Secondary | ICD-10-CM | POA: Diagnosis not present

## 2015-10-09 DIAGNOSIS — I639 Cerebral infarction, unspecified: Secondary | ICD-10-CM | POA: Diagnosis not present

## 2015-10-09 DIAGNOSIS — M6281 Muscle weakness (generalized): Secondary | ICD-10-CM | POA: Diagnosis not present

## 2015-10-09 DIAGNOSIS — E1122 Type 2 diabetes mellitus with diabetic chronic kidney disease: Secondary | ICD-10-CM | POA: Diagnosis not present

## 2015-10-09 DIAGNOSIS — E162 Hypoglycemia, unspecified: Secondary | ICD-10-CM | POA: Diagnosis not present

## 2015-10-10 DIAGNOSIS — M6281 Muscle weakness (generalized): Secondary | ICD-10-CM | POA: Diagnosis not present

## 2015-10-10 DIAGNOSIS — E162 Hypoglycemia, unspecified: Secondary | ICD-10-CM | POA: Diagnosis not present

## 2015-10-10 DIAGNOSIS — I639 Cerebral infarction, unspecified: Secondary | ICD-10-CM | POA: Diagnosis not present

## 2015-10-10 DIAGNOSIS — E1122 Type 2 diabetes mellitus with diabetic chronic kidney disease: Secondary | ICD-10-CM | POA: Diagnosis not present

## 2015-10-10 DIAGNOSIS — I69859 Hemiplegia and hemiparesis following other cerebrovascular disease affecting unspecified side: Secondary | ICD-10-CM | POA: Diagnosis not present

## 2015-10-10 DIAGNOSIS — G40501 Epileptic seizures related to external causes, not intractable, with status epilepticus: Secondary | ICD-10-CM | POA: Diagnosis not present

## 2015-10-11 DIAGNOSIS — E1122 Type 2 diabetes mellitus with diabetic chronic kidney disease: Secondary | ICD-10-CM | POA: Diagnosis not present

## 2015-10-11 DIAGNOSIS — I69859 Hemiplegia and hemiparesis following other cerebrovascular disease affecting unspecified side: Secondary | ICD-10-CM | POA: Diagnosis not present

## 2015-10-11 DIAGNOSIS — M6281 Muscle weakness (generalized): Secondary | ICD-10-CM | POA: Diagnosis not present

## 2015-10-11 DIAGNOSIS — I639 Cerebral infarction, unspecified: Secondary | ICD-10-CM | POA: Diagnosis not present

## 2015-10-11 DIAGNOSIS — G40501 Epileptic seizures related to external causes, not intractable, with status epilepticus: Secondary | ICD-10-CM | POA: Diagnosis not present

## 2015-10-11 DIAGNOSIS — E162 Hypoglycemia, unspecified: Secondary | ICD-10-CM | POA: Diagnosis not present

## 2015-10-14 ENCOUNTER — Non-Acute Institutional Stay (SKILLED_NURSING_FACILITY): Payer: Medicare Other | Admitting: Adult Health

## 2015-10-14 ENCOUNTER — Encounter: Payer: Self-pay | Admitting: Adult Health

## 2015-10-14 DIAGNOSIS — I69859 Hemiplegia and hemiparesis following other cerebrovascular disease affecting unspecified side: Secondary | ICD-10-CM | POA: Diagnosis not present

## 2015-10-14 DIAGNOSIS — G40501 Epileptic seizures related to external causes, not intractable, with status epilepticus: Secondary | ICD-10-CM | POA: Diagnosis not present

## 2015-10-14 DIAGNOSIS — N189 Chronic kidney disease, unspecified: Secondary | ICD-10-CM

## 2015-10-14 DIAGNOSIS — D631 Anemia in chronic kidney disease: Secondary | ICD-10-CM | POA: Diagnosis not present

## 2015-10-14 DIAGNOSIS — I69354 Hemiplegia and hemiparesis following cerebral infarction affecting left non-dominant side: Secondary | ICD-10-CM | POA: Diagnosis not present

## 2015-10-14 DIAGNOSIS — E034 Atrophy of thyroid (acquired): Secondary | ICD-10-CM | POA: Diagnosis not present

## 2015-10-14 DIAGNOSIS — E038 Other specified hypothyroidism: Secondary | ICD-10-CM | POA: Diagnosis not present

## 2015-10-14 DIAGNOSIS — E162 Hypoglycemia, unspecified: Secondary | ICD-10-CM | POA: Diagnosis not present

## 2015-10-14 DIAGNOSIS — I10 Essential (primary) hypertension: Secondary | ICD-10-CM | POA: Diagnosis not present

## 2015-10-14 DIAGNOSIS — E1122 Type 2 diabetes mellitus with diabetic chronic kidney disease: Secondary | ICD-10-CM

## 2015-10-14 DIAGNOSIS — M6281 Muscle weakness (generalized): Secondary | ICD-10-CM | POA: Diagnosis not present

## 2015-10-14 DIAGNOSIS — N183 Chronic kidney disease, stage 3 (moderate): Secondary | ICD-10-CM

## 2015-10-14 DIAGNOSIS — I6330 Cerebral infarction due to thrombosis of unspecified cerebral artery: Secondary | ICD-10-CM

## 2015-10-14 DIAGNOSIS — I639 Cerebral infarction, unspecified: Secondary | ICD-10-CM | POA: Diagnosis not present

## 2015-10-14 NOTE — Progress Notes (Signed)
Patient ID: [name], female   DOB: May 31, 1930, 82 y.o.   MRN: [id number]    Provider:  Ok Edwards, NP Location:  [place] Room Number: 117-A Place of Service:  SNF (31)   PCP: Gildardo Cranker, DO Patient Care Team: Gildardo Cranker, DO as PCP - General (Internal Medicine) Doyce Loose, LCSW as Social Worker Gerlene Fee, NP as Nurse Practitioner (Geriatric Medicine) [facility] ([place])  Extended Emergency Contact Information Primary Emergency Contact: Benthall,Eleanor Address: [place], [place] Phone: [phone] Relation: Daughter Secondary Emergency Contact: Claudette Head [place] of [place] Phone: [phone] Mobile Phone: [phone] Relation: Sister  Code Status: Full Code Goals of Care: Advanced Directive information Advanced Directives 08/06/2015  Does patient have an advance directive? No  Type of Advance Directive -  Copy of advanced directive(s) in chart? -  Would patient like information on creating an advanced directive? No - patient declined information      Allergies  Allergen Reactions  . Aspirin Hives  . Penicillins Hives     Chief Complaint  Patient presents with  . Annual Exam    Annual    HPI: Patient is a 82 y.o. female seen today for an annual comprehensive examination. Over the past year she has not  been hospitalized. Her weight has been fairly stable over the past year. She is unable to fully participate in the hpi or ros. There are no nursing concerns at this time.    Past Medical History:  Diagnosis Date  . Anemia   . Arthritis   . Chronic kidney disease   . Diabetes mellitus without complication ([place])   . Hemorrhoid   . Hypertension   . Stroke ([place])   . Thyroid disease    Past Surgical History:  Procedure Laterality Date  . ABDOMINAL HYSTERECTOMY     PARTIAL  . BREAST LUMPECTOMY     LEFT    . MASTECTOMY  05/2008   Left    reports that she has never smoked. She does not have any smokeless tobacco history on file. She reports that she does not drink alcohol or use drugs. Social History   Social History  . Marital status: Single    Spouse name: N/A  . Number of children: N/A  . Years of education: N/A   Occupational History  . Not on file.   Social History Main Topics  . Smoking status: Never Smoker  . Smokeless tobacco: Not on file  . Alcohol use No  . Drug use: No  . Sexual activity: Not on file   Other Topics Concern  . Not on file   Social History Narrative  . No narrative on file   History reviewed. No pertinent family history.  Vitals:   10/14/15 1146  BP: (!) 146/84  Pulse: 64  Resp: 16  Temp: 98.1 F (36.7 C)  TempSrc: Oral  SpO2: 99%  Weight: 162 lb 2 oz (73.5 kg)  Height: 5\' 1"  (1.549 m)   Body mass index is 30.63 kg/m.    Medication List       Accurate as of 10/14/15 11:51 AM. Always use your most recent med list.          clopidogrel 75 MG tablet Commonly known as:  PLAVIX Take 1 tablet (75 mg total) by mouth daily.   cyanocobalamin 1000 MCG tablet Take  1 tablet (1,000 mcg total) by mouth daily.   donepezil 5 MG tablet Commonly known as:  ARICEPT Take 5 mg by mouth at bedtime.   ferrous sulfate 325 (65 FE) MG tablet Take 325 mg by mouth 2 (two) times daily with a meal.   HYDROcodone-acetaminophen 10-325 MG tablet Commonly known as:  NORCO Take 1 tablet by mouth 2 (two) times daily.   levothyroxine 112 MCG tablet Commonly known as:  SYNTHROID, LEVOTHROID Take 112 mcg by mouth daily before breakfast.   lisinopril 40 MG tablet Commonly known as:  PRINIVIL,ZESTRIL Take 40 mg by mouth daily.   LORazepam 0.5 MG tablet Commonly known as:  ATIVAN Give one tablet 1 time daily, give 1 tablet by mouth as needed two times daily   metoprolol tartrate 25 MG tablet Commonly known as:  LOPRESSOR Take 12.5 mg by mouth 2 (two)  times daily.   MILK OF MAGNESIA 400 MG/5ML suspension Generic drug:  magnesium hydroxide Take 30 mLs by mouth daily as needed for mild constipation.   NUTRITIONAL SUPPLEMENT PO Take 120 mLs by mouth 4 (four) times daily. Puree texture, regular consistency   PARoxetine 40 MG tablet Commonly known as:  PAXIL Take 40 mg by mouth daily.   potassium chloride 10 MEQ tablet Commonly known as:  K-DUR Take 10 mEq by mouth daily.   PROSTAT PO Take by mouth. 30 cc by mouth BID for albumin 2.7   rOPINIRole 3 MG tablet Commonly known as:  REQUIP Take 3 mg by mouth at bedtime.   vitamin C 1000 MG tablet Take 1,000 mg by mouth daily.   Vitamin D 2000 units tablet Take 2,000 Units by mouth daily.        SIGNIFICANT DIAGNOSTIC EXAMS  09-20-14: wbc 6.2; hgb 9.1; hct 29.1; mcv 78.0; plt 319; glucose 108; bun 15; creat 0.82; k+4.1; na++134; liver normal albumin 2.3  09-26-14: wbc 5.2; hgb 9.8; hct 31.7; mcv 76.4; plt 353; glucose 129; bun 14; creat 0.79; k+ 4.3; na++139; liver normal albumin 2.7 10-24-14: hgb a1c 6.7 11-05-14 glucose 126; bun 10; creat 0.71; k+ 3.4; na++142 11-08-14: wbc 8.7; hgb 9.0; hct 29.0; mcv 77.1; plt 295; iron 22; tibc 115  11-12-14: glucose 135; bun 9; creat 0.75; k+ 3.2; na++144  01-25-15: glucose 87; bun 19; creat 1.45; k+ 4.8; na++141; liver normal albumin 2.7  04-11-15: glucose 108; bun 22; creat 1.40; k+ 5.5; na++140  04-17-15; glucose 84; bun 21; creat 1.22; k+ 5.1; na++139  05-24-15: wbc 5.0; hgb 8.3; hct 27.1; mcv 79.2; plt 200; glucose 95; bun 35; creat 1.36; k+ 4.2; na++ 140 liver normal albumin 2.9; chol 111; ldl 51; trig 75; hdl 45; tsh 4.38; hgb a1c 5.5; urine micro-albumin 23.6  08-31-15: wbc 9.7; hgb 11.1; hct 35.2; mcv 78.6; plt 275; glucose 210; bun 31; creat 1.57; k+ 4.6; na++ 142 09-04-15: urine culture: 1,000 colonies  e-coli  09-18-15: tsh 97.79; free T3: 1.5; free T4: 0.6      Review of Systems  Unable to perform ROS: Other      Physical Exam    Constitutional: No distress.  Eyes: Conjunctivae are normal.  Neck: Neck supple. No JVD present. No thyromegaly present.  Cardiovascular: Normal rate, regular rhythm and intact distal pulses.  S3 S4 split  Respiratory: Effort normal and breath sounds normal. No respiratory distress. She has no wheezes.  GI: Soft. Bowel sounds are normal. She exhibits no distension. There is no tenderness.  Musculoskeletal: She exhibits no edema.  Left  hemiparesis    Lymphadenopathy:    She has no cervical adenopathy.  Neurological: She is alert.  Skin: Skin is warm and dry. She is not diaphoretic.   Psychiatric: She has a normal mood and affect.     ASSESSMENT/ PLAN:    1. CVA: with left hemiparesis: is neurologically stable; will continue plavix 75 mg daily takes vicodin 10/325 mg twice daily for pain management and will monitor   2. Hypothyroidism: will continue synthroid 112 mcg daily and will monitor her free t4: 1.17; tsh 4.38    Will check her thyroid labs.   3. Anemia: is presently stable will continue iron twice daily; vit b12 1000 mcg daily  hgb 8.3 iron level is up to 22 from  13; will monitor  4. Diabetes:  hgb is 11.1  Will not make changes will monitor her status.   5. Hypertension: her blood pressure is elevated will continue  lopressor 12.5 mg twice daily  lisinopril  40 mg daily   6. Restless leg syndrome: will continue requip  3 mg nightly   7.  Depression with anxiety: will continue paxil 40 mg daily has ativan 0.5 mg three times daily as needed will monitor   8. ckd stage III:  Her bun/creat is 35/1.57; will monitor   9. Hypokalemia: will continue  k+ to 10 meq daily  K+ is 4.6  10. Left breast cancer T1N1 ER/PR +, HER 2 negative, 2 sentinel nodes involved:  post left mastectomy with 3 node axillary evaluation and 5 years of [facility] thru 06-2013. No known active breast cancer Will monitor   Her maintenance is up to date  Time spent with patient  45  minutes >50% time spent  counseling; reviewing medical record; tests; labs; and developing future plan of care     MD is aware of resident's narcotic use and is in agreement with current plan of care. We will wean dosage as appropriate for resident   Ok Edwards NP [facility]  Contact [phone] Monday through Friday 8am- 5pm  After hours call [phone]

## 2015-10-15 DIAGNOSIS — M6281 Muscle weakness (generalized): Secondary | ICD-10-CM | POA: Diagnosis not present

## 2015-10-15 DIAGNOSIS — I69859 Hemiplegia and hemiparesis following other cerebrovascular disease affecting unspecified side: Secondary | ICD-10-CM | POA: Diagnosis not present

## 2015-10-15 DIAGNOSIS — I639 Cerebral infarction, unspecified: Secondary | ICD-10-CM | POA: Diagnosis not present

## 2015-10-15 DIAGNOSIS — E1122 Type 2 diabetes mellitus with diabetic chronic kidney disease: Secondary | ICD-10-CM | POA: Diagnosis not present

## 2015-10-15 DIAGNOSIS — G40501 Epileptic seizures related to external causes, not intractable, with status epilepticus: Secondary | ICD-10-CM | POA: Diagnosis not present

## 2015-10-15 DIAGNOSIS — E162 Hypoglycemia, unspecified: Secondary | ICD-10-CM | POA: Diagnosis not present

## 2015-10-16 DIAGNOSIS — E162 Hypoglycemia, unspecified: Secondary | ICD-10-CM | POA: Diagnosis not present

## 2015-10-16 DIAGNOSIS — G40501 Epileptic seizures related to external causes, not intractable, with status epilepticus: Secondary | ICD-10-CM | POA: Diagnosis not present

## 2015-10-16 DIAGNOSIS — I69859 Hemiplegia and hemiparesis following other cerebrovascular disease affecting unspecified side: Secondary | ICD-10-CM | POA: Diagnosis not present

## 2015-10-16 DIAGNOSIS — E1122 Type 2 diabetes mellitus with diabetic chronic kidney disease: Secondary | ICD-10-CM | POA: Diagnosis not present

## 2015-10-16 DIAGNOSIS — M6281 Muscle weakness (generalized): Secondary | ICD-10-CM | POA: Diagnosis not present

## 2015-10-16 DIAGNOSIS — I639 Cerebral infarction, unspecified: Secondary | ICD-10-CM | POA: Diagnosis not present

## 2015-10-17 DIAGNOSIS — E1122 Type 2 diabetes mellitus with diabetic chronic kidney disease: Secondary | ICD-10-CM | POA: Diagnosis not present

## 2015-10-17 DIAGNOSIS — I639 Cerebral infarction, unspecified: Secondary | ICD-10-CM | POA: Diagnosis not present

## 2015-10-17 DIAGNOSIS — M6281 Muscle weakness (generalized): Secondary | ICD-10-CM | POA: Diagnosis not present

## 2015-10-17 DIAGNOSIS — G40501 Epileptic seizures related to external causes, not intractable, with status epilepticus: Secondary | ICD-10-CM | POA: Diagnosis not present

## 2015-10-17 DIAGNOSIS — E162 Hypoglycemia, unspecified: Secondary | ICD-10-CM | POA: Diagnosis not present

## 2015-10-17 DIAGNOSIS — I69859 Hemiplegia and hemiparesis following other cerebrovascular disease affecting unspecified side: Secondary | ICD-10-CM | POA: Diagnosis not present

## 2015-10-18 DIAGNOSIS — M6281 Muscle weakness (generalized): Secondary | ICD-10-CM | POA: Diagnosis not present

## 2015-10-18 DIAGNOSIS — I69859 Hemiplegia and hemiparesis following other cerebrovascular disease affecting unspecified side: Secondary | ICD-10-CM | POA: Diagnosis not present

## 2015-10-18 DIAGNOSIS — E1122 Type 2 diabetes mellitus with diabetic chronic kidney disease: Secondary | ICD-10-CM | POA: Diagnosis not present

## 2015-10-18 DIAGNOSIS — E162 Hypoglycemia, unspecified: Secondary | ICD-10-CM | POA: Diagnosis not present

## 2015-10-18 DIAGNOSIS — G40501 Epileptic seizures related to external causes, not intractable, with status epilepticus: Secondary | ICD-10-CM | POA: Diagnosis not present

## 2015-10-18 DIAGNOSIS — I639 Cerebral infarction, unspecified: Secondary | ICD-10-CM | POA: Diagnosis not present

## 2015-10-21 DIAGNOSIS — R489 Unspecified symbolic dysfunctions: Secondary | ICD-10-CM | POA: Diagnosis not present

## 2015-10-21 DIAGNOSIS — I639 Cerebral infarction, unspecified: Secondary | ICD-10-CM | POA: Diagnosis not present

## 2015-10-21 DIAGNOSIS — G40501 Epileptic seizures related to external causes, not intractable, with status epilepticus: Secondary | ICD-10-CM | POA: Diagnosis not present

## 2015-10-21 DIAGNOSIS — E162 Hypoglycemia, unspecified: Secondary | ICD-10-CM | POA: Diagnosis not present

## 2015-10-21 DIAGNOSIS — F339 Major depressive disorder, recurrent, unspecified: Secondary | ICD-10-CM | POA: Diagnosis not present

## 2015-10-21 DIAGNOSIS — R1312 Dysphagia, oropharyngeal phase: Secondary | ICD-10-CM | POA: Diagnosis not present

## 2015-10-21 DIAGNOSIS — E1122 Type 2 diabetes mellitus with diabetic chronic kidney disease: Secondary | ICD-10-CM | POA: Diagnosis not present

## 2015-10-21 DIAGNOSIS — M6281 Muscle weakness (generalized): Secondary | ICD-10-CM | POA: Diagnosis not present

## 2015-10-21 DIAGNOSIS — I69859 Hemiplegia and hemiparesis following other cerebrovascular disease affecting unspecified side: Secondary | ICD-10-CM | POA: Diagnosis not present

## 2015-10-22 DIAGNOSIS — G40501 Epileptic seizures related to external causes, not intractable, with status epilepticus: Secondary | ICD-10-CM | POA: Diagnosis not present

## 2015-10-22 DIAGNOSIS — E162 Hypoglycemia, unspecified: Secondary | ICD-10-CM | POA: Diagnosis not present

## 2015-10-22 DIAGNOSIS — I69859 Hemiplegia and hemiparesis following other cerebrovascular disease affecting unspecified side: Secondary | ICD-10-CM | POA: Diagnosis not present

## 2015-10-22 DIAGNOSIS — I639 Cerebral infarction, unspecified: Secondary | ICD-10-CM | POA: Diagnosis not present

## 2015-10-22 DIAGNOSIS — M6281 Muscle weakness (generalized): Secondary | ICD-10-CM | POA: Diagnosis not present

## 2015-10-22 DIAGNOSIS — E1122 Type 2 diabetes mellitus with diabetic chronic kidney disease: Secondary | ICD-10-CM | POA: Diagnosis not present

## 2015-10-23 DIAGNOSIS — E1122 Type 2 diabetes mellitus with diabetic chronic kidney disease: Secondary | ICD-10-CM | POA: Diagnosis not present

## 2015-10-23 DIAGNOSIS — M6281 Muscle weakness (generalized): Secondary | ICD-10-CM | POA: Diagnosis not present

## 2015-10-23 DIAGNOSIS — G40501 Epileptic seizures related to external causes, not intractable, with status epilepticus: Secondary | ICD-10-CM | POA: Diagnosis not present

## 2015-10-23 DIAGNOSIS — I639 Cerebral infarction, unspecified: Secondary | ICD-10-CM | POA: Diagnosis not present

## 2015-10-23 DIAGNOSIS — E162 Hypoglycemia, unspecified: Secondary | ICD-10-CM | POA: Diagnosis not present

## 2015-10-23 DIAGNOSIS — I69859 Hemiplegia and hemiparesis following other cerebrovascular disease affecting unspecified side: Secondary | ICD-10-CM | POA: Diagnosis not present

## 2015-10-25 DIAGNOSIS — G40501 Epileptic seizures related to external causes, not intractable, with status epilepticus: Secondary | ICD-10-CM | POA: Diagnosis not present

## 2015-10-25 DIAGNOSIS — E1122 Type 2 diabetes mellitus with diabetic chronic kidney disease: Secondary | ICD-10-CM | POA: Diagnosis not present

## 2015-10-25 DIAGNOSIS — E162 Hypoglycemia, unspecified: Secondary | ICD-10-CM | POA: Diagnosis not present

## 2015-10-25 DIAGNOSIS — M6281 Muscle weakness (generalized): Secondary | ICD-10-CM | POA: Diagnosis not present

## 2015-10-25 DIAGNOSIS — E039 Hypothyroidism, unspecified: Secondary | ICD-10-CM | POA: Diagnosis not present

## 2015-10-25 DIAGNOSIS — I639 Cerebral infarction, unspecified: Secondary | ICD-10-CM | POA: Diagnosis not present

## 2015-10-25 DIAGNOSIS — I69859 Hemiplegia and hemiparesis following other cerebrovascular disease affecting unspecified side: Secondary | ICD-10-CM | POA: Diagnosis not present

## 2015-10-25 LAB — TSH: TSH: 36.56 u[IU]/mL — AB (ref 0.41–5.90)

## 2015-10-26 DIAGNOSIS — E162 Hypoglycemia, unspecified: Secondary | ICD-10-CM | POA: Diagnosis not present

## 2015-10-26 DIAGNOSIS — I69859 Hemiplegia and hemiparesis following other cerebrovascular disease affecting unspecified side: Secondary | ICD-10-CM | POA: Diagnosis not present

## 2015-10-26 DIAGNOSIS — I639 Cerebral infarction, unspecified: Secondary | ICD-10-CM | POA: Diagnosis not present

## 2015-10-26 DIAGNOSIS — E1122 Type 2 diabetes mellitus with diabetic chronic kidney disease: Secondary | ICD-10-CM | POA: Diagnosis not present

## 2015-10-26 DIAGNOSIS — M6281 Muscle weakness (generalized): Secondary | ICD-10-CM | POA: Diagnosis not present

## 2015-10-26 DIAGNOSIS — G40501 Epileptic seizures related to external causes, not intractable, with status epilepticus: Secondary | ICD-10-CM | POA: Diagnosis not present

## 2015-10-28 DIAGNOSIS — I69859 Hemiplegia and hemiparesis following other cerebrovascular disease affecting unspecified side: Secondary | ICD-10-CM | POA: Diagnosis not present

## 2015-10-28 DIAGNOSIS — E162 Hypoglycemia, unspecified: Secondary | ICD-10-CM | POA: Diagnosis not present

## 2015-10-28 DIAGNOSIS — M6281 Muscle weakness (generalized): Secondary | ICD-10-CM | POA: Diagnosis not present

## 2015-10-28 DIAGNOSIS — G40501 Epileptic seizures related to external causes, not intractable, with status epilepticus: Secondary | ICD-10-CM | POA: Diagnosis not present

## 2015-10-28 DIAGNOSIS — E1122 Type 2 diabetes mellitus with diabetic chronic kidney disease: Secondary | ICD-10-CM | POA: Diagnosis not present

## 2015-10-28 DIAGNOSIS — I639 Cerebral infarction, unspecified: Secondary | ICD-10-CM | POA: Diagnosis not present

## 2015-10-29 DIAGNOSIS — E1122 Type 2 diabetes mellitus with diabetic chronic kidney disease: Secondary | ICD-10-CM | POA: Diagnosis not present

## 2015-10-29 DIAGNOSIS — I69859 Hemiplegia and hemiparesis following other cerebrovascular disease affecting unspecified side: Secondary | ICD-10-CM | POA: Diagnosis not present

## 2015-10-29 DIAGNOSIS — M6281 Muscle weakness (generalized): Secondary | ICD-10-CM | POA: Diagnosis not present

## 2015-10-29 DIAGNOSIS — E162 Hypoglycemia, unspecified: Secondary | ICD-10-CM | POA: Diagnosis not present

## 2015-10-29 DIAGNOSIS — I639 Cerebral infarction, unspecified: Secondary | ICD-10-CM | POA: Diagnosis not present

## 2015-10-29 DIAGNOSIS — G40501 Epileptic seizures related to external causes, not intractable, with status epilepticus: Secondary | ICD-10-CM | POA: Diagnosis not present

## 2015-11-06 DIAGNOSIS — E162 Hypoglycemia, unspecified: Secondary | ICD-10-CM | POA: Diagnosis not present

## 2015-11-06 DIAGNOSIS — I639 Cerebral infarction, unspecified: Secondary | ICD-10-CM | POA: Diagnosis not present

## 2015-11-06 DIAGNOSIS — G40501 Epileptic seizures related to external causes, not intractable, with status epilepticus: Secondary | ICD-10-CM | POA: Diagnosis not present

## 2015-11-06 DIAGNOSIS — M6281 Muscle weakness (generalized): Secondary | ICD-10-CM | POA: Diagnosis not present

## 2015-11-06 DIAGNOSIS — E1122 Type 2 diabetes mellitus with diabetic chronic kidney disease: Secondary | ICD-10-CM | POA: Diagnosis not present

## 2015-11-06 DIAGNOSIS — I69859 Hemiplegia and hemiparesis following other cerebrovascular disease affecting unspecified side: Secondary | ICD-10-CM | POA: Diagnosis not present

## 2015-11-07 DIAGNOSIS — M6281 Muscle weakness (generalized): Secondary | ICD-10-CM | POA: Diagnosis not present

## 2015-11-07 DIAGNOSIS — G40501 Epileptic seizures related to external causes, not intractable, with status epilepticus: Secondary | ICD-10-CM | POA: Diagnosis not present

## 2015-11-07 DIAGNOSIS — E162 Hypoglycemia, unspecified: Secondary | ICD-10-CM | POA: Diagnosis not present

## 2015-11-07 DIAGNOSIS — E1122 Type 2 diabetes mellitus with diabetic chronic kidney disease: Secondary | ICD-10-CM | POA: Diagnosis not present

## 2015-11-07 DIAGNOSIS — I69859 Hemiplegia and hemiparesis following other cerebrovascular disease affecting unspecified side: Secondary | ICD-10-CM | POA: Diagnosis not present

## 2015-11-07 DIAGNOSIS — I639 Cerebral infarction, unspecified: Secondary | ICD-10-CM | POA: Diagnosis not present

## 2015-11-08 DIAGNOSIS — E1122 Type 2 diabetes mellitus with diabetic chronic kidney disease: Secondary | ICD-10-CM | POA: Diagnosis not present

## 2015-11-08 DIAGNOSIS — E162 Hypoglycemia, unspecified: Secondary | ICD-10-CM | POA: Diagnosis not present

## 2015-11-08 DIAGNOSIS — G40501 Epileptic seizures related to external causes, not intractable, with status epilepticus: Secondary | ICD-10-CM | POA: Diagnosis not present

## 2015-11-08 DIAGNOSIS — M6281 Muscle weakness (generalized): Secondary | ICD-10-CM | POA: Diagnosis not present

## 2015-11-08 DIAGNOSIS — I639 Cerebral infarction, unspecified: Secondary | ICD-10-CM | POA: Diagnosis not present

## 2015-11-08 DIAGNOSIS — I69859 Hemiplegia and hemiparesis following other cerebrovascular disease affecting unspecified side: Secondary | ICD-10-CM | POA: Diagnosis not present

## 2015-11-11 DIAGNOSIS — E162 Hypoglycemia, unspecified: Secondary | ICD-10-CM | POA: Diagnosis not present

## 2015-11-11 DIAGNOSIS — E1122 Type 2 diabetes mellitus with diabetic chronic kidney disease: Secondary | ICD-10-CM | POA: Diagnosis not present

## 2015-11-11 DIAGNOSIS — I639 Cerebral infarction, unspecified: Secondary | ICD-10-CM | POA: Diagnosis not present

## 2015-11-11 DIAGNOSIS — M6281 Muscle weakness (generalized): Secondary | ICD-10-CM | POA: Diagnosis not present

## 2015-11-11 DIAGNOSIS — G40501 Epileptic seizures related to external causes, not intractable, with status epilepticus: Secondary | ICD-10-CM | POA: Diagnosis not present

## 2015-11-11 DIAGNOSIS — I69859 Hemiplegia and hemiparesis following other cerebrovascular disease affecting unspecified side: Secondary | ICD-10-CM | POA: Diagnosis not present

## 2015-11-12 DIAGNOSIS — E1122 Type 2 diabetes mellitus with diabetic chronic kidney disease: Secondary | ICD-10-CM | POA: Diagnosis not present

## 2015-11-12 DIAGNOSIS — M6281 Muscle weakness (generalized): Secondary | ICD-10-CM | POA: Diagnosis not present

## 2015-11-12 DIAGNOSIS — G40501 Epileptic seizures related to external causes, not intractable, with status epilepticus: Secondary | ICD-10-CM | POA: Diagnosis not present

## 2015-11-12 DIAGNOSIS — E162 Hypoglycemia, unspecified: Secondary | ICD-10-CM | POA: Diagnosis not present

## 2015-11-12 DIAGNOSIS — I639 Cerebral infarction, unspecified: Secondary | ICD-10-CM | POA: Diagnosis not present

## 2015-11-12 DIAGNOSIS — I69859 Hemiplegia and hemiparesis following other cerebrovascular disease affecting unspecified side: Secondary | ICD-10-CM | POA: Diagnosis not present

## 2015-11-13 DIAGNOSIS — E162 Hypoglycemia, unspecified: Secondary | ICD-10-CM | POA: Diagnosis not present

## 2015-11-13 DIAGNOSIS — E1122 Type 2 diabetes mellitus with diabetic chronic kidney disease: Secondary | ICD-10-CM | POA: Diagnosis not present

## 2015-11-13 DIAGNOSIS — M6281 Muscle weakness (generalized): Secondary | ICD-10-CM | POA: Diagnosis not present

## 2015-11-13 DIAGNOSIS — I639 Cerebral infarction, unspecified: Secondary | ICD-10-CM | POA: Diagnosis not present

## 2015-11-13 DIAGNOSIS — I69859 Hemiplegia and hemiparesis following other cerebrovascular disease affecting unspecified side: Secondary | ICD-10-CM | POA: Diagnosis not present

## 2015-11-13 DIAGNOSIS — G40501 Epileptic seizures related to external causes, not intractable, with status epilepticus: Secondary | ICD-10-CM | POA: Diagnosis not present

## 2015-11-14 ENCOUNTER — Encounter: Payer: Self-pay | Admitting: Adult Health

## 2015-11-14 ENCOUNTER — Non-Acute Institutional Stay (SKILLED_NURSING_FACILITY): Payer: Medicare Other | Admitting: Adult Health

## 2015-11-14 DIAGNOSIS — E034 Atrophy of thyroid (acquired): Secondary | ICD-10-CM

## 2015-11-14 DIAGNOSIS — F0150 Vascular dementia without behavioral disturbance: Secondary | ICD-10-CM

## 2015-11-14 DIAGNOSIS — I10 Essential (primary) hypertension: Secondary | ICD-10-CM | POA: Diagnosis not present

## 2015-11-14 DIAGNOSIS — D631 Anemia in chronic kidney disease: Secondary | ICD-10-CM

## 2015-11-14 DIAGNOSIS — I6330 Cerebral infarction due to thrombosis of unspecified cerebral artery: Secondary | ICD-10-CM | POA: Diagnosis not present

## 2015-11-14 DIAGNOSIS — E1122 Type 2 diabetes mellitus with diabetic chronic kidney disease: Secondary | ICD-10-CM | POA: Diagnosis not present

## 2015-11-14 DIAGNOSIS — M6281 Muscle weakness (generalized): Secondary | ICD-10-CM | POA: Diagnosis not present

## 2015-11-14 DIAGNOSIS — E162 Hypoglycemia, unspecified: Secondary | ICD-10-CM | POA: Diagnosis not present

## 2015-11-14 DIAGNOSIS — G40501 Epileptic seizures related to external causes, not intractable, with status epilepticus: Secondary | ICD-10-CM | POA: Diagnosis not present

## 2015-11-14 DIAGNOSIS — C50912 Malignant neoplasm of unspecified site of left female breast: Secondary | ICD-10-CM

## 2015-11-14 DIAGNOSIS — N183 Chronic kidney disease, stage 3 unspecified: Secondary | ICD-10-CM

## 2015-11-14 DIAGNOSIS — I69859 Hemiplegia and hemiparesis following other cerebrovascular disease affecting unspecified side: Secondary | ICD-10-CM | POA: Diagnosis not present

## 2015-11-14 DIAGNOSIS — I69354 Hemiplegia and hemiparesis following cerebral infarction affecting left non-dominant side: Secondary | ICD-10-CM

## 2015-11-14 DIAGNOSIS — I639 Cerebral infarction, unspecified: Secondary | ICD-10-CM | POA: Diagnosis not present

## 2015-11-14 NOTE — Progress Notes (Signed)
Patient ID: [name], female   DOB: [birth year], 82 y.o.   MRN: [id number]   Location:   [facility] Room Number: 117-A Place of Service:  SNF (31)   CODE STATUS: Full Code  Allergies  Allergen Reactions  . Aspirin Hives  . Penicillins Hives    Chief Complaint  Patient presents with  . Medical Management of Chronic Issues    Follow up    HPI:  She is a long term resident of this facilty being seen for the management of her chronic illnesses. Overall there is little change in her status. She does continue to get out of bed on a daily basis. She is unable to fully participate in the hpi or ros. There are no nursing concerns at this time.    Past Medical History:  Diagnosis Date  . Anemia   . Arthritis   . Chronic kidney disease   . Diabetes mellitus without complication ([place])   . Hemorrhoid   . Hypertension   . Stroke ([place])   . Thyroid disease     Past Surgical History:  Procedure Laterality Date  . ABDOMINAL HYSTERECTOMY     PARTIAL  . BREAST LUMPECTOMY     LEFT  . MASTECTOMY  05/2008   Left    Social History   Social History  . Marital status: Single    Spouse name: N/A  . Number of children: N/A  . Years of education: N/A   Occupational History  . Not on file.   Social History Main Topics  . Smoking status: Never Smoker  . Smokeless tobacco: Not on file  . Alcohol use No  . Drug use: No  . Sexual activity: Not on file   Other Topics Concern  . Not on file   Social History Narrative  . No narrative on file   History reviewed. No pertinent family history.    VITAL SIGNS BP (!) 142/71   Pulse 73   Temp 97.2 F (36.2 C) (Oral)   Resp 18   Ht 5\' 1"  (1.549 m)   Wt 165 lb (74.8 kg)   SpO2 95%   BMI 31.18 kg/m   Patient's Medications  New Prescriptions   No medications on file  Previous Medications   ASCORBIC ACID (VITAMIN C) 1000 MG TABLET    Take 1,000 mg by mouth daily.   CHOLECALCIFEROL (VITAMIN D) 2000 UNITS  TABLET    Take 2,000 Units by mouth daily.   CLOPIDOGREL (PLAVIX) 75 MG TABLET    Take 1 tablet (75 mg total) by mouth daily.   DONEPEZIL (ARICEPT) 5 MG TABLET    Take 5 mg by mouth at bedtime.    FERROUS SULFATE 325 (65 FE) MG TABLET    Take 325 mg by mouth 2 (two) times daily with a meal.   HYDROCODONE-ACETAMINOPHEN (NORCO) 10-325 MG PER TABLET    Take 1 tablet by mouth 2 (two) times daily.   LEVOTHYROXINE (SYNTHROID, LEVOTHROID) 137 MCG TABLET    Take 137 mcg by mouth daily before breakfast.   LISINOPRIL (PRINIVIL,ZESTRIL) 40 MG TABLET    Take 40 mg by mouth daily.   LORAZEPAM (ATIVAN) 0.5 MG TABLET    Give one tablet 1 time daily, give 1 tablet by mouth as needed two times daily   METOPROLOL TARTRATE (LOPRESSOR) 25 MG TABLET    Take 12.5 mg by mouth 2 (two) times daily.   NUTRITIONAL SUPPLEMENTS (NUTRITIONAL SUPPLEMENT PO)    Take 120 mLs  by mouth 4 (four) times daily. Puree texture, regular consistency   PAROXETINE (PAXIL) 40 MG TABLET    Take 40 mg by mouth daily.   POLLEN EXTRACTS (PROSTAT PO)    Take by mouth. 30 cc by mouth BID for albumin 2.7   POTASSIUM CHLORIDE (K-DUR) 10 MEQ TABLET    Take 10 mEq by mouth daily.   ROPINIROLE (REQUIP) 3 MG TABLET    Take 3 mg by mouth at bedtime.    VITAMIN B-12 1000 MCG TABLET    Take 1 tablet (1,000 mcg total) by mouth daily.  Modified Medications   No medications on file  Discontinued Medications   LEVOTHYROXINE (SYNTHROID, LEVOTHROID) 112 MCG TABLET    Take 112 mcg by mouth daily before breakfast.   MAGNESIUM HYDROXIDE (MILK OF MAGNESIA) 400 MG/5ML SUSPENSION    Take 30 mLs by mouth daily as needed for mild constipation.     SIGNIFICANT DIAGNOSTIC EXAMS  01-25-15: glucose 87; bun 19; creat 1.45; k+ 4.8; na++141; liver normal albumin 2.7  04-11-15: glucose 108; bun 22; creat 1.40; k+ 5.5; na++140  04-17-15; glucose 84; bun 21; creat 1.22; k+ 5.1; na++139  05-24-15: wbc 5.0; hgb 8.3; hct 27.1; mcv 79.2; plt 200; glucose 95; bun 35; creat 1.36; k+  4.2; na++ 140 liver normal albumin 2.9; chol 111; ldl 51; trig 75; hdl 45; tsh 4.38; hgb a1c 5.5; urine micro-albumin 23.6  08-31-15: wbc 9.7; hgb 11.1; hct 35.2; mcv 78.6; plt 275; glucose 210; bun 31; creat 1.57; k+ 4.6; na++ 142 09-04-15: urine culture: 1,000 colonies  e-coli  09-18-15: tsh 97.79; free T3: 1.5; free T4: 0.6      Review of Systems  Unable to perform ROS: Other      Physical Exam  Constitutional: No distress.  Eyes: Conjunctivae are normal.  Neck: Neck supple. No JVD present. No thyromegaly present.  Cardiovascular: Normal rate, regular rhythm and intact distal pulses.  S3 S4 split  Respiratory: Effort normal and breath sounds normal. No respiratory distress. She has no wheezes.  GI: Soft. Bowel sounds are normal. She exhibits no distension. There is no tenderness.  Musculoskeletal: She exhibits no edema.  Left hemiparesis    Lymphadenopathy:    She has no cervical adenopathy.  Neurological: She is alert.  Skin: Skin is warm and dry. She is not diaphoretic.   Psychiatric: She has a normal mood and affect.     ASSESSMENT/ PLAN:  1. CVA: with left hemiparesis: is neurologically stable; will continue plavix 75 mg daily takes vicodin 10/325 mg twice daily for pain management and will monitor   2. Hypothyroidism: will continue synthroid 137 mcg daily and will monitor her free t4: 0.69; tsh 97.79 thyroid labs pending   3. Anemia: is presently stable will continue iron twice daily; vit b12 1000 mcg daily  hgb 11.1 iron level is up to 22 from  13; will monitor  4. Diabetes:  hgb a1c 5.5   Will not make changes will monitor her status.   5. Hypertension: her blood pressure is elevated will continue  lopressor 12.5 mg twice daily  lisinopril  40 mg daily   6. Restless leg syndrome: will continue requip  3 mg nightly   7.  Depression with anxiety: will continue paxil 40 mg daily has ativan 0.5 mg three times daily as needed will monitor   8. ckd stage III:  Her  bun/creat is 35/1.57; will monitor   9. Hypokalemia: will continue  k+ to 10 meq  daily  K+ is 4.6  10. Left breast cancer T1N1 ER/PR +, HER 2 negative, 2 sentinel nodes involved:  post left mastectomy with 3 node axillary evaluation and 5 years of [facility] thru 06-2013. No known active breast cancer Will monitor    MD is aware of resident's narcotic use and is in agreement with current plan of care. We will attempt to wean resident as apropriate   Ok Edwards NP [facility]  Contact [phone] Monday through Friday 8am- 5pm  After hours call [phone]

## 2015-11-15 DIAGNOSIS — Z23 Encounter for immunization: Secondary | ICD-10-CM | POA: Diagnosis not present

## 2015-11-16 DIAGNOSIS — I639 Cerebral infarction, unspecified: Secondary | ICD-10-CM | POA: Diagnosis not present

## 2015-11-16 DIAGNOSIS — E162 Hypoglycemia, unspecified: Secondary | ICD-10-CM | POA: Diagnosis not present

## 2015-11-16 DIAGNOSIS — G40501 Epileptic seizures related to external causes, not intractable, with status epilepticus: Secondary | ICD-10-CM | POA: Diagnosis not present

## 2015-11-16 DIAGNOSIS — E1122 Type 2 diabetes mellitus with diabetic chronic kidney disease: Secondary | ICD-10-CM | POA: Diagnosis not present

## 2015-11-16 DIAGNOSIS — I69859 Hemiplegia and hemiparesis following other cerebrovascular disease affecting unspecified side: Secondary | ICD-10-CM | POA: Diagnosis not present

## 2015-11-16 DIAGNOSIS — M6281 Muscle weakness (generalized): Secondary | ICD-10-CM | POA: Diagnosis not present

## 2015-11-18 DIAGNOSIS — I639 Cerebral infarction, unspecified: Secondary | ICD-10-CM | POA: Diagnosis not present

## 2015-11-18 DIAGNOSIS — E1122 Type 2 diabetes mellitus with diabetic chronic kidney disease: Secondary | ICD-10-CM | POA: Diagnosis not present

## 2015-11-18 DIAGNOSIS — M6281 Muscle weakness (generalized): Secondary | ICD-10-CM | POA: Diagnosis not present

## 2015-11-18 DIAGNOSIS — G40501 Epileptic seizures related to external causes, not intractable, with status epilepticus: Secondary | ICD-10-CM | POA: Diagnosis not present

## 2015-11-18 DIAGNOSIS — E162 Hypoglycemia, unspecified: Secondary | ICD-10-CM | POA: Diagnosis not present

## 2015-11-18 DIAGNOSIS — I69859 Hemiplegia and hemiparesis following other cerebrovascular disease affecting unspecified side: Secondary | ICD-10-CM | POA: Diagnosis not present

## 2015-11-19 DIAGNOSIS — M6281 Muscle weakness (generalized): Secondary | ICD-10-CM | POA: Diagnosis not present

## 2015-11-19 DIAGNOSIS — G40501 Epileptic seizures related to external causes, not intractable, with status epilepticus: Secondary | ICD-10-CM | POA: Diagnosis not present

## 2015-11-19 DIAGNOSIS — I639 Cerebral infarction, unspecified: Secondary | ICD-10-CM | POA: Diagnosis not present

## 2015-11-19 DIAGNOSIS — E1122 Type 2 diabetes mellitus with diabetic chronic kidney disease: Secondary | ICD-10-CM | POA: Diagnosis not present

## 2015-11-19 DIAGNOSIS — E162 Hypoglycemia, unspecified: Secondary | ICD-10-CM | POA: Diagnosis not present

## 2015-11-19 DIAGNOSIS — I69859 Hemiplegia and hemiparesis following other cerebrovascular disease affecting unspecified side: Secondary | ICD-10-CM | POA: Diagnosis not present

## 2015-11-20 DIAGNOSIS — I639 Cerebral infarction, unspecified: Secondary | ICD-10-CM | POA: Diagnosis not present

## 2015-11-20 DIAGNOSIS — G40501 Epileptic seizures related to external causes, not intractable, with status epilepticus: Secondary | ICD-10-CM | POA: Diagnosis not present

## 2015-11-20 DIAGNOSIS — I69859 Hemiplegia and hemiparesis following other cerebrovascular disease affecting unspecified side: Secondary | ICD-10-CM | POA: Diagnosis not present

## 2015-11-20 DIAGNOSIS — F339 Major depressive disorder, recurrent, unspecified: Secondary | ICD-10-CM | POA: Diagnosis not present

## 2015-11-20 DIAGNOSIS — E1122 Type 2 diabetes mellitus with diabetic chronic kidney disease: Secondary | ICD-10-CM | POA: Diagnosis not present

## 2015-11-20 DIAGNOSIS — R1312 Dysphagia, oropharyngeal phase: Secondary | ICD-10-CM | POA: Diagnosis not present

## 2015-11-20 DIAGNOSIS — M6281 Muscle weakness (generalized): Secondary | ICD-10-CM | POA: Diagnosis not present

## 2015-11-20 DIAGNOSIS — E162 Hypoglycemia, unspecified: Secondary | ICD-10-CM | POA: Diagnosis not present

## 2015-11-20 DIAGNOSIS — R489 Unspecified symbolic dysfunctions: Secondary | ICD-10-CM | POA: Diagnosis not present

## 2015-11-21 DIAGNOSIS — G40501 Epileptic seizures related to external causes, not intractable, with status epilepticus: Secondary | ICD-10-CM | POA: Diagnosis not present

## 2015-11-21 DIAGNOSIS — I69859 Hemiplegia and hemiparesis following other cerebrovascular disease affecting unspecified side: Secondary | ICD-10-CM | POA: Diagnosis not present

## 2015-11-21 DIAGNOSIS — I639 Cerebral infarction, unspecified: Secondary | ICD-10-CM | POA: Diagnosis not present

## 2015-11-21 DIAGNOSIS — M6281 Muscle weakness (generalized): Secondary | ICD-10-CM | POA: Diagnosis not present

## 2015-11-21 DIAGNOSIS — E1122 Type 2 diabetes mellitus with diabetic chronic kidney disease: Secondary | ICD-10-CM | POA: Diagnosis not present

## 2015-11-21 DIAGNOSIS — E162 Hypoglycemia, unspecified: Secondary | ICD-10-CM | POA: Diagnosis not present

## 2015-11-23 DIAGNOSIS — I69859 Hemiplegia and hemiparesis following other cerebrovascular disease affecting unspecified side: Secondary | ICD-10-CM | POA: Diagnosis not present

## 2015-11-23 DIAGNOSIS — E162 Hypoglycemia, unspecified: Secondary | ICD-10-CM | POA: Diagnosis not present

## 2015-11-23 DIAGNOSIS — G40501 Epileptic seizures related to external causes, not intractable, with status epilepticus: Secondary | ICD-10-CM | POA: Diagnosis not present

## 2015-11-23 DIAGNOSIS — I639 Cerebral infarction, unspecified: Secondary | ICD-10-CM | POA: Diagnosis not present

## 2015-11-23 DIAGNOSIS — E1122 Type 2 diabetes mellitus with diabetic chronic kidney disease: Secondary | ICD-10-CM | POA: Diagnosis not present

## 2015-11-23 DIAGNOSIS — M6281 Muscle weakness (generalized): Secondary | ICD-10-CM | POA: Diagnosis not present

## 2015-11-25 DIAGNOSIS — G40501 Epileptic seizures related to external causes, not intractable, with status epilepticus: Secondary | ICD-10-CM | POA: Diagnosis not present

## 2015-11-25 DIAGNOSIS — E1122 Type 2 diabetes mellitus with diabetic chronic kidney disease: Secondary | ICD-10-CM | POA: Diagnosis not present

## 2015-11-25 DIAGNOSIS — M6281 Muscle weakness (generalized): Secondary | ICD-10-CM | POA: Diagnosis not present

## 2015-11-25 DIAGNOSIS — I639 Cerebral infarction, unspecified: Secondary | ICD-10-CM | POA: Diagnosis not present

## 2015-11-25 DIAGNOSIS — I69859 Hemiplegia and hemiparesis following other cerebrovascular disease affecting unspecified side: Secondary | ICD-10-CM | POA: Diagnosis not present

## 2015-11-25 DIAGNOSIS — E162 Hypoglycemia, unspecified: Secondary | ICD-10-CM | POA: Diagnosis not present

## 2015-11-27 DIAGNOSIS — G40501 Epileptic seizures related to external causes, not intractable, with status epilepticus: Secondary | ICD-10-CM | POA: Diagnosis not present

## 2015-11-27 DIAGNOSIS — E162 Hypoglycemia, unspecified: Secondary | ICD-10-CM | POA: Diagnosis not present

## 2015-11-27 DIAGNOSIS — I69859 Hemiplegia and hemiparesis following other cerebrovascular disease affecting unspecified side: Secondary | ICD-10-CM | POA: Diagnosis not present

## 2015-11-27 DIAGNOSIS — I639 Cerebral infarction, unspecified: Secondary | ICD-10-CM | POA: Diagnosis not present

## 2015-11-27 DIAGNOSIS — E1122 Type 2 diabetes mellitus with diabetic chronic kidney disease: Secondary | ICD-10-CM | POA: Diagnosis not present

## 2015-11-27 DIAGNOSIS — M6281 Muscle weakness (generalized): Secondary | ICD-10-CM | POA: Diagnosis not present

## 2015-11-28 DIAGNOSIS — E162 Hypoglycemia, unspecified: Secondary | ICD-10-CM | POA: Diagnosis not present

## 2015-11-28 DIAGNOSIS — I639 Cerebral infarction, unspecified: Secondary | ICD-10-CM | POA: Diagnosis not present

## 2015-11-28 DIAGNOSIS — I69859 Hemiplegia and hemiparesis following other cerebrovascular disease affecting unspecified side: Secondary | ICD-10-CM | POA: Diagnosis not present

## 2015-11-28 DIAGNOSIS — E1122 Type 2 diabetes mellitus with diabetic chronic kidney disease: Secondary | ICD-10-CM | POA: Diagnosis not present

## 2015-11-28 DIAGNOSIS — G40501 Epileptic seizures related to external causes, not intractable, with status epilepticus: Secondary | ICD-10-CM | POA: Diagnosis not present

## 2015-11-28 DIAGNOSIS — M6281 Muscle weakness (generalized): Secondary | ICD-10-CM | POA: Diagnosis not present

## 2015-11-29 DIAGNOSIS — F419 Anxiety disorder, unspecified: Secondary | ICD-10-CM | POA: Diagnosis not present

## 2015-11-29 DIAGNOSIS — G40501 Epileptic seizures related to external causes, not intractable, with status epilepticus: Secondary | ICD-10-CM | POA: Diagnosis not present

## 2015-11-29 DIAGNOSIS — I639 Cerebral infarction, unspecified: Secondary | ICD-10-CM | POA: Diagnosis not present

## 2015-11-29 DIAGNOSIS — E1122 Type 2 diabetes mellitus with diabetic chronic kidney disease: Secondary | ICD-10-CM | POA: Diagnosis not present

## 2015-11-29 DIAGNOSIS — M6281 Muscle weakness (generalized): Secondary | ICD-10-CM | POA: Diagnosis not present

## 2015-11-29 DIAGNOSIS — E162 Hypoglycemia, unspecified: Secondary | ICD-10-CM | POA: Diagnosis not present

## 2015-11-29 DIAGNOSIS — I69859 Hemiplegia and hemiparesis following other cerebrovascular disease affecting unspecified side: Secondary | ICD-10-CM | POA: Diagnosis not present

## 2015-11-29 DIAGNOSIS — F0391 Unspecified dementia with behavioral disturbance: Secondary | ICD-10-CM | POA: Diagnosis not present

## 2015-11-30 DIAGNOSIS — M6281 Muscle weakness (generalized): Secondary | ICD-10-CM | POA: Diagnosis not present

## 2015-11-30 DIAGNOSIS — E1122 Type 2 diabetes mellitus with diabetic chronic kidney disease: Secondary | ICD-10-CM | POA: Diagnosis not present

## 2015-11-30 DIAGNOSIS — E162 Hypoglycemia, unspecified: Secondary | ICD-10-CM | POA: Diagnosis not present

## 2015-11-30 DIAGNOSIS — I69859 Hemiplegia and hemiparesis following other cerebrovascular disease affecting unspecified side: Secondary | ICD-10-CM | POA: Diagnosis not present

## 2015-11-30 DIAGNOSIS — G40501 Epileptic seizures related to external causes, not intractable, with status epilepticus: Secondary | ICD-10-CM | POA: Diagnosis not present

## 2015-11-30 DIAGNOSIS — I639 Cerebral infarction, unspecified: Secondary | ICD-10-CM | POA: Diagnosis not present

## 2015-12-03 DIAGNOSIS — I639 Cerebral infarction, unspecified: Secondary | ICD-10-CM | POA: Diagnosis not present

## 2015-12-03 DIAGNOSIS — I69859 Hemiplegia and hemiparesis following other cerebrovascular disease affecting unspecified side: Secondary | ICD-10-CM | POA: Diagnosis not present

## 2015-12-03 DIAGNOSIS — G40501 Epileptic seizures related to external causes, not intractable, with status epilepticus: Secondary | ICD-10-CM | POA: Diagnosis not present

## 2015-12-03 DIAGNOSIS — M6281 Muscle weakness (generalized): Secondary | ICD-10-CM | POA: Diagnosis not present

## 2015-12-03 DIAGNOSIS — E162 Hypoglycemia, unspecified: Secondary | ICD-10-CM | POA: Diagnosis not present

## 2015-12-03 DIAGNOSIS — E1122 Type 2 diabetes mellitus with diabetic chronic kidney disease: Secondary | ICD-10-CM | POA: Diagnosis not present

## 2015-12-04 DIAGNOSIS — M6281 Muscle weakness (generalized): Secondary | ICD-10-CM | POA: Diagnosis not present

## 2015-12-04 DIAGNOSIS — I69859 Hemiplegia and hemiparesis following other cerebrovascular disease affecting unspecified side: Secondary | ICD-10-CM | POA: Diagnosis not present

## 2015-12-04 DIAGNOSIS — E162 Hypoglycemia, unspecified: Secondary | ICD-10-CM | POA: Diagnosis not present

## 2015-12-04 DIAGNOSIS — E1122 Type 2 diabetes mellitus with diabetic chronic kidney disease: Secondary | ICD-10-CM | POA: Diagnosis not present

## 2015-12-04 DIAGNOSIS — G40501 Epileptic seizures related to external causes, not intractable, with status epilepticus: Secondary | ICD-10-CM | POA: Diagnosis not present

## 2015-12-04 DIAGNOSIS — I639 Cerebral infarction, unspecified: Secondary | ICD-10-CM | POA: Diagnosis not present

## 2015-12-05 DIAGNOSIS — I69859 Hemiplegia and hemiparesis following other cerebrovascular disease affecting unspecified side: Secondary | ICD-10-CM | POA: Diagnosis not present

## 2015-12-05 DIAGNOSIS — I639 Cerebral infarction, unspecified: Secondary | ICD-10-CM | POA: Diagnosis not present

## 2015-12-05 DIAGNOSIS — G40501 Epileptic seizures related to external causes, not intractable, with status epilepticus: Secondary | ICD-10-CM | POA: Diagnosis not present

## 2015-12-05 DIAGNOSIS — M6281 Muscle weakness (generalized): Secondary | ICD-10-CM | POA: Diagnosis not present

## 2015-12-05 DIAGNOSIS — E1122 Type 2 diabetes mellitus with diabetic chronic kidney disease: Secondary | ICD-10-CM | POA: Diagnosis not present

## 2015-12-05 DIAGNOSIS — E162 Hypoglycemia, unspecified: Secondary | ICD-10-CM | POA: Diagnosis not present

## 2015-12-06 DIAGNOSIS — M6281 Muscle weakness (generalized): Secondary | ICD-10-CM | POA: Diagnosis not present

## 2015-12-06 DIAGNOSIS — I69859 Hemiplegia and hemiparesis following other cerebrovascular disease affecting unspecified side: Secondary | ICD-10-CM | POA: Diagnosis not present

## 2015-12-06 DIAGNOSIS — I639 Cerebral infarction, unspecified: Secondary | ICD-10-CM | POA: Diagnosis not present

## 2015-12-06 DIAGNOSIS — G40501 Epileptic seizures related to external causes, not intractable, with status epilepticus: Secondary | ICD-10-CM | POA: Diagnosis not present

## 2015-12-06 DIAGNOSIS — E162 Hypoglycemia, unspecified: Secondary | ICD-10-CM | POA: Diagnosis not present

## 2015-12-06 DIAGNOSIS — E1122 Type 2 diabetes mellitus with diabetic chronic kidney disease: Secondary | ICD-10-CM | POA: Diagnosis not present

## 2015-12-07 DIAGNOSIS — G40501 Epileptic seizures related to external causes, not intractable, with status epilepticus: Secondary | ICD-10-CM | POA: Diagnosis not present

## 2015-12-07 DIAGNOSIS — I69859 Hemiplegia and hemiparesis following other cerebrovascular disease affecting unspecified side: Secondary | ICD-10-CM | POA: Diagnosis not present

## 2015-12-07 DIAGNOSIS — I639 Cerebral infarction, unspecified: Secondary | ICD-10-CM | POA: Diagnosis not present

## 2015-12-07 DIAGNOSIS — E162 Hypoglycemia, unspecified: Secondary | ICD-10-CM | POA: Diagnosis not present

## 2015-12-07 DIAGNOSIS — E1122 Type 2 diabetes mellitus with diabetic chronic kidney disease: Secondary | ICD-10-CM | POA: Diagnosis not present

## 2015-12-07 DIAGNOSIS — M6281 Muscle weakness (generalized): Secondary | ICD-10-CM | POA: Diagnosis not present

## 2015-12-08 DIAGNOSIS — I639 Cerebral infarction, unspecified: Secondary | ICD-10-CM | POA: Diagnosis not present

## 2015-12-08 DIAGNOSIS — E162 Hypoglycemia, unspecified: Secondary | ICD-10-CM | POA: Diagnosis not present

## 2015-12-08 DIAGNOSIS — I69859 Hemiplegia and hemiparesis following other cerebrovascular disease affecting unspecified side: Secondary | ICD-10-CM | POA: Diagnosis not present

## 2015-12-08 DIAGNOSIS — G40501 Epileptic seizures related to external causes, not intractable, with status epilepticus: Secondary | ICD-10-CM | POA: Diagnosis not present

## 2015-12-08 DIAGNOSIS — M6281 Muscle weakness (generalized): Secondary | ICD-10-CM | POA: Diagnosis not present

## 2015-12-08 DIAGNOSIS — E1122 Type 2 diabetes mellitus with diabetic chronic kidney disease: Secondary | ICD-10-CM | POA: Diagnosis not present

## 2015-12-09 DIAGNOSIS — E1122 Type 2 diabetes mellitus with diabetic chronic kidney disease: Secondary | ICD-10-CM | POA: Diagnosis not present

## 2015-12-09 DIAGNOSIS — I69859 Hemiplegia and hemiparesis following other cerebrovascular disease affecting unspecified side: Secondary | ICD-10-CM | POA: Diagnosis not present

## 2015-12-09 DIAGNOSIS — M6281 Muscle weakness (generalized): Secondary | ICD-10-CM | POA: Diagnosis not present

## 2015-12-09 DIAGNOSIS — G40501 Epileptic seizures related to external causes, not intractable, with status epilepticus: Secondary | ICD-10-CM | POA: Diagnosis not present

## 2015-12-09 DIAGNOSIS — I639 Cerebral infarction, unspecified: Secondary | ICD-10-CM | POA: Diagnosis not present

## 2015-12-09 DIAGNOSIS — E162 Hypoglycemia, unspecified: Secondary | ICD-10-CM | POA: Diagnosis not present

## 2015-12-10 DIAGNOSIS — I69859 Hemiplegia and hemiparesis following other cerebrovascular disease affecting unspecified side: Secondary | ICD-10-CM | POA: Diagnosis not present

## 2015-12-10 DIAGNOSIS — E162 Hypoglycemia, unspecified: Secondary | ICD-10-CM | POA: Diagnosis not present

## 2015-12-10 DIAGNOSIS — E1122 Type 2 diabetes mellitus with diabetic chronic kidney disease: Secondary | ICD-10-CM | POA: Diagnosis not present

## 2015-12-10 DIAGNOSIS — G40501 Epileptic seizures related to external causes, not intractable, with status epilepticus: Secondary | ICD-10-CM | POA: Diagnosis not present

## 2015-12-10 DIAGNOSIS — M6281 Muscle weakness (generalized): Secondary | ICD-10-CM | POA: Diagnosis not present

## 2015-12-10 DIAGNOSIS — I639 Cerebral infarction, unspecified: Secondary | ICD-10-CM | POA: Diagnosis not present

## 2015-12-11 DIAGNOSIS — E162 Hypoglycemia, unspecified: Secondary | ICD-10-CM | POA: Diagnosis not present

## 2015-12-11 DIAGNOSIS — E1122 Type 2 diabetes mellitus with diabetic chronic kidney disease: Secondary | ICD-10-CM | POA: Diagnosis not present

## 2015-12-11 DIAGNOSIS — M6281 Muscle weakness (generalized): Secondary | ICD-10-CM | POA: Diagnosis not present

## 2015-12-11 DIAGNOSIS — I639 Cerebral infarction, unspecified: Secondary | ICD-10-CM | POA: Diagnosis not present

## 2015-12-11 DIAGNOSIS — G40501 Epileptic seizures related to external causes, not intractable, with status epilepticus: Secondary | ICD-10-CM | POA: Diagnosis not present

## 2015-12-11 DIAGNOSIS — I69859 Hemiplegia and hemiparesis following other cerebrovascular disease affecting unspecified side: Secondary | ICD-10-CM | POA: Diagnosis not present

## 2015-12-13 DIAGNOSIS — M6281 Muscle weakness (generalized): Secondary | ICD-10-CM | POA: Diagnosis not present

## 2015-12-13 DIAGNOSIS — E1122 Type 2 diabetes mellitus with diabetic chronic kidney disease: Secondary | ICD-10-CM | POA: Diagnosis not present

## 2015-12-13 DIAGNOSIS — I69859 Hemiplegia and hemiparesis following other cerebrovascular disease affecting unspecified side: Secondary | ICD-10-CM | POA: Diagnosis not present

## 2015-12-13 DIAGNOSIS — I639 Cerebral infarction, unspecified: Secondary | ICD-10-CM | POA: Diagnosis not present

## 2015-12-13 DIAGNOSIS — G40501 Epileptic seizures related to external causes, not intractable, with status epilepticus: Secondary | ICD-10-CM | POA: Diagnosis not present

## 2015-12-13 DIAGNOSIS — E162 Hypoglycemia, unspecified: Secondary | ICD-10-CM | POA: Diagnosis not present

## 2015-12-16 ENCOUNTER — Encounter: Payer: Self-pay | Admitting: Adult Health

## 2015-12-16 ENCOUNTER — Non-Acute Institutional Stay (SKILLED_NURSING_FACILITY): Payer: Medicare Other | Admitting: Adult Health

## 2015-12-16 DIAGNOSIS — E034 Atrophy of thyroid (acquired): Secondary | ICD-10-CM

## 2015-12-16 DIAGNOSIS — N183 Chronic kidney disease, stage 3 unspecified: Secondary | ICD-10-CM

## 2015-12-16 DIAGNOSIS — E1122 Type 2 diabetes mellitus with diabetic chronic kidney disease: Secondary | ICD-10-CM

## 2015-12-16 DIAGNOSIS — E162 Hypoglycemia, unspecified: Secondary | ICD-10-CM | POA: Diagnosis not present

## 2015-12-16 DIAGNOSIS — I6330 Cerebral infarction due to thrombosis of unspecified cerebral artery: Secondary | ICD-10-CM

## 2015-12-16 DIAGNOSIS — I69859 Hemiplegia and hemiparesis following other cerebrovascular disease affecting unspecified side: Secondary | ICD-10-CM | POA: Diagnosis not present

## 2015-12-16 DIAGNOSIS — I10 Essential (primary) hypertension: Secondary | ICD-10-CM | POA: Diagnosis not present

## 2015-12-16 DIAGNOSIS — C50912 Malignant neoplasm of unspecified site of left female breast: Secondary | ICD-10-CM | POA: Diagnosis not present

## 2015-12-16 DIAGNOSIS — I69354 Hemiplegia and hemiparesis following cerebral infarction affecting left non-dominant side: Secondary | ICD-10-CM | POA: Diagnosis not present

## 2015-12-16 DIAGNOSIS — I639 Cerebral infarction, unspecified: Secondary | ICD-10-CM | POA: Diagnosis not present

## 2015-12-16 DIAGNOSIS — R1314 Dysphagia, pharyngoesophageal phase: Secondary | ICD-10-CM | POA: Diagnosis not present

## 2015-12-16 DIAGNOSIS — G40501 Epileptic seizures related to external causes, not intractable, with status epilepticus: Secondary | ICD-10-CM | POA: Diagnosis not present

## 2015-12-16 DIAGNOSIS — M6281 Muscle weakness (generalized): Secondary | ICD-10-CM | POA: Diagnosis not present

## 2015-12-16 NOTE — Progress Notes (Signed)
Patient ID: [name], female   DOB: [birth year], 82 y.o.   MRN: [id number]   Location:   [facility] Room Number: 117-A Place of Service:  SNF (31)   CODE STATUS: Full Code  Allergies  Allergen Reactions  . Aspirin Hives  . Penicillins Hives    Chief Complaint  Patient presents with  . Medical Management of Chronic Issues    Follow up    HPI:  She is a long term resident of this facility being seen for the management of her chronic illnesses. Overall her status is stable. She does get out of bed on a daily basis. She is unable to fully participate in the hpi or ros; but did tell me that she was feeling good. There are no nursing concerns at this time.    Past Medical History:  Diagnosis Date  . Anemia   . Arthritis   . Chronic kidney disease   . Diabetes mellitus without complication ([place])   . Hemorrhoid   . Hypertension   . Stroke ([place])   . Thyroid disease     Past Surgical History:  Procedure Laterality Date  . ABDOMINAL HYSTERECTOMY     PARTIAL  . BREAST LUMPECTOMY     LEFT  . MASTECTOMY  05/2008   Left    Social History   Social History  . Marital status: Single    Spouse name: N/A  . Number of children: N/A  . Years of education: N/A   Occupational History  . Not on file.   Social History Main Topics  . Smoking status: Never Smoker  . Smokeless tobacco: Not on file  . Alcohol use No  . Drug use: No  . Sexual activity: Not on file   Other Topics Concern  . Not on file   Social History Narrative  . No narrative on file   History reviewed. No pertinent family history.    VITAL SIGNS BP (!) 162/88   Pulse (!) 56   Temp 97.2 F (36.2 C) (Oral)   Resp 18   Ht 5\' 1"  (1.549 m)   Wt 165 lb (74.8 kg)   SpO2 97%   BMI 31.18 kg/m   Patient's Medications  New Prescriptions   No medications on file  Previous Medications   ASCORBIC ACID (VITAMIN C) 1000 MG TABLET    Take 1,000 mg by mouth daily.   CHOLECALCIFEROL  (VITAMIN D) 2000 UNITS TABLET    Take 2,000 Units by mouth daily.   CLOPIDOGREL (PLAVIX) 75 MG TABLET    Take 1 tablet (75 mg total) by mouth daily.   DONEPEZIL (ARICEPT) 5 MG TABLET    Take 5 mg by mouth at bedtime.    FERROUS SULFATE 325 (65 FE) MG TABLET    Take 325 mg by mouth 2 (two) times daily with a meal.   HYDROCODONE-ACETAMINOPHEN (NORCO) 10-325 MG PER TABLET    Take 1 tablet by mouth 2 (two) times daily.   LEVOTHYROXINE (SYNTHROID, LEVOTHROID) 137 MCG TABLET    Take 137 mcg by mouth daily before breakfast.   LISINOPRIL (PRINIVIL,ZESTRIL) 40 MG TABLET    Take 40 mg by mouth daily.   LORAZEPAM (ATIVAN) 0.5 MG TABLET    Give one tablet 1 time daily, give 1 tablet by mouth as needed two times daily   METOPROLOL TARTRATE (LOPRESSOR) 25 MG TABLET    Take 12.5 mg by mouth 2 (two) times daily.   NUTRITIONAL SUPPLEMENTS (NUTRITIONAL SUPPLEMENT PO)  Take 120 mLs by mouth 4 (four) times daily. Puree texture, regular consistency   PAROXETINE (PAXIL) 40 MG TABLET    Take 40 mg by mouth daily.   POLLEN EXTRACTS (PROSTAT PO)    Take by mouth. 30 cc by mouth BID for albumin 2.7   POTASSIUM CHLORIDE (K-DUR) 10 MEQ TABLET    Take 10 mEq by mouth daily.   ROPINIROLE (REQUIP) 3 MG TABLET    Take 3 mg by mouth at bedtime.    VITAMIN B-12 1000 MCG TABLET    Take 1 tablet (1,000 mcg total) by mouth daily.  Modified Medications   No medications on file  Discontinued Medications   No medications on file     SIGNIFICANT DIAGNOSTIC EXAMS  01-25-15: glucose 87; bun 19; creat 1.45; k+ 4.8; na++141; liver normal albumin 2.7  04-11-15: glucose 108; bun 22; creat 1.40; k+ 5.5; na++140  04-17-15; glucose 84; bun 21; creat 1.22; k+ 5.1; na++139  05-24-15: wbc 5.0; hgb 8.3; hct 27.1; mcv 79.2; plt 200; glucose 95; bun 35; creat 1.36; k+ 4.2; na++ 140 liver normal albumin 2.9; chol 111; ldl 51; trig 75; hdl 45; tsh 4.38; hgb a1c 5.5; urine micro-albumin 23.6  08-31-15: wbc 9.7; hgb 11.1; hct 35.2; mcv 78.6; plt 275;  glucose 210; bun 31; creat 1.57; k+ 4.6; na++ 142 09-04-15: urine culture: 1,000 colonies  e-coli  09-18-15: tsh 97.79; free T3: 1.5; free T4: 0.6  10-25-15:tsh 36.56: free T4: 0.8; free T3: 1.8     Review of Systems  Unable to perform ROS: Other      Physical Exam  Constitutional: No distress.  Eyes: Conjunctivae are normal.  Neck: Neck supple. No JVD present. No thyromegaly present.  Cardiovascular: Normal rate, regular rhythm and intact distal pulses.  S3 S4 split  Respiratory: Effort normal and breath sounds normal. No respiratory distress. She has no wheezes.  GI: Soft. Bowel sounds are normal. She exhibits no distension. There is no tenderness.  Musculoskeletal: She exhibits no edema.  Left hemiparesis    Lymphadenopathy:    She has no cervical adenopathy.  Neurological: She is alert.  Skin: Skin is warm and dry. She is not diaphoretic.   Psychiatric: She has a normal mood and affect.     ASSESSMENT/ PLAN:  1. CVA: with left hemiparesis: is neurologically stable; will continue plavix 75 mg daily takes vicodin 10/325 mg twice daily for pain management and will monitor   2. Hypothyroidism: will continue synthroid 137 mcg daily and will monitor her free t4: 0.8; tsh 36.56   3. Anemia: is presently stable will continue iron twice daily; vit b12 1000 mcg daily  hgb 11.1 iron level is up to 22 from  13; will monitor  4. Diabetes:  hgb a1c 5.5   Will not make changes will monitor her status.   5. Hypertension: her blood pressure is elevated will continue  lopressor 12.5 mg twice daily  lisinopril  40 mg daily   6. Restless leg syndrome: will continue requip  3 mg nightly   7.  Depression with anxiety: will continue paxil 40 mg daily has ativan 0.5 mg three times daily as needed will monitor   8. ckd stage III:  Her bun/creat is 35/1.57; will monitor   9. Hypokalemia: will continue  k+ to 10 meq daily  K+ is 4.6  10. Left breast cancer T1N1 ER/PR +, HER 2 negative, 2  sentinel nodes involved:  post left mastectomy with 3 node axillary evaluation  and 5 years of [facility] thru 06-2013. No known active breast cancer Will monitor   11. Dysphagia: no signs of aspiration present; will continue nectar this liquids.      MD is aware of resident's narcotic use and is in agreement with current plan of care. We will attempt to wean resident as appropriate.     Ok Edwards NP [facility]  Contact [phone] Monday through Friday 8am- 5pm  After hours call [phone]

## 2015-12-17 DIAGNOSIS — E1122 Type 2 diabetes mellitus with diabetic chronic kidney disease: Secondary | ICD-10-CM | POA: Diagnosis not present

## 2015-12-17 DIAGNOSIS — I639 Cerebral infarction, unspecified: Secondary | ICD-10-CM | POA: Diagnosis not present

## 2015-12-17 DIAGNOSIS — M6281 Muscle weakness (generalized): Secondary | ICD-10-CM | POA: Diagnosis not present

## 2015-12-17 DIAGNOSIS — E162 Hypoglycemia, unspecified: Secondary | ICD-10-CM | POA: Diagnosis not present

## 2015-12-17 DIAGNOSIS — I69859 Hemiplegia and hemiparesis following other cerebrovascular disease affecting unspecified side: Secondary | ICD-10-CM | POA: Diagnosis not present

## 2015-12-17 DIAGNOSIS — G40501 Epileptic seizures related to external causes, not intractable, with status epilepticus: Secondary | ICD-10-CM | POA: Diagnosis not present

## 2015-12-17 LAB — TSH: TSH: 7.45 u[IU]/mL — AB (ref <=5.90)

## 2015-12-18 DIAGNOSIS — E1122 Type 2 diabetes mellitus with diabetic chronic kidney disease: Secondary | ICD-10-CM | POA: Diagnosis not present

## 2015-12-18 DIAGNOSIS — E162 Hypoglycemia, unspecified: Secondary | ICD-10-CM | POA: Diagnosis not present

## 2015-12-18 DIAGNOSIS — M6281 Muscle weakness (generalized): Secondary | ICD-10-CM | POA: Diagnosis not present

## 2015-12-18 DIAGNOSIS — I69859 Hemiplegia and hemiparesis following other cerebrovascular disease affecting unspecified side: Secondary | ICD-10-CM | POA: Diagnosis not present

## 2015-12-18 DIAGNOSIS — G40501 Epileptic seizures related to external causes, not intractable, with status epilepticus: Secondary | ICD-10-CM | POA: Diagnosis not present

## 2015-12-18 DIAGNOSIS — I639 Cerebral infarction, unspecified: Secondary | ICD-10-CM | POA: Diagnosis not present

## 2015-12-19 ENCOUNTER — Other Ambulatory Visit (HOSPITAL_COMMUNITY): Payer: Self-pay | Admitting: Internal Medicine

## 2015-12-19 DIAGNOSIS — M6281 Muscle weakness (generalized): Secondary | ICD-10-CM | POA: Diagnosis not present

## 2015-12-19 DIAGNOSIS — R1319 Other dysphagia: Secondary | ICD-10-CM

## 2015-12-19 DIAGNOSIS — I639 Cerebral infarction, unspecified: Secondary | ICD-10-CM | POA: Diagnosis not present

## 2015-12-19 DIAGNOSIS — G40501 Epileptic seizures related to external causes, not intractable, with status epilepticus: Secondary | ICD-10-CM | POA: Diagnosis not present

## 2015-12-19 DIAGNOSIS — E162 Hypoglycemia, unspecified: Secondary | ICD-10-CM | POA: Diagnosis not present

## 2015-12-19 DIAGNOSIS — E1122 Type 2 diabetes mellitus with diabetic chronic kidney disease: Secondary | ICD-10-CM | POA: Diagnosis not present

## 2015-12-19 DIAGNOSIS — I69859 Hemiplegia and hemiparesis following other cerebrovascular disease affecting unspecified side: Secondary | ICD-10-CM | POA: Diagnosis not present

## 2015-12-20 ENCOUNTER — Ambulatory Visit (HOSPITAL_COMMUNITY)
Admission: RE | Admit: 2015-12-20 | Discharge: 2015-12-20 | Disposition: A | Discharge status: Home / Self Care | Payer: Medicare Other | Source: Ambulatory Visit | Attending: Internal Medicine | Admitting: Internal Medicine

## 2015-12-20 DIAGNOSIS — I639 Cerebral infarction, unspecified: Secondary | ICD-10-CM | POA: Diagnosis not present

## 2015-12-20 DIAGNOSIS — R1312 Dysphagia, oropharyngeal phase: Secondary | ICD-10-CM | POA: Diagnosis not present

## 2015-12-20 DIAGNOSIS — G40501 Epileptic seizures related to external causes, not intractable, with status epilepticus: Secondary | ICD-10-CM | POA: Diagnosis not present

## 2015-12-20 DIAGNOSIS — R1319 Other dysphagia: Secondary | ICD-10-CM | POA: Insufficient documentation

## 2015-12-20 DIAGNOSIS — R489 Unspecified symbolic dysfunctions: Secondary | ICD-10-CM | POA: Diagnosis not present

## 2015-12-20 DIAGNOSIS — E1122 Type 2 diabetes mellitus with diabetic chronic kidney disease: Secondary | ICD-10-CM | POA: Diagnosis not present

## 2015-12-20 DIAGNOSIS — F339 Major depressive disorder, recurrent, unspecified: Secondary | ICD-10-CM | POA: Diagnosis not present

## 2015-12-20 DIAGNOSIS — R1310 Dysphagia, unspecified: Secondary | ICD-10-CM | POA: Diagnosis not present

## 2015-12-20 DIAGNOSIS — M6281 Muscle weakness (generalized): Secondary | ICD-10-CM | POA: Diagnosis not present

## 2015-12-20 DIAGNOSIS — E162 Hypoglycemia, unspecified: Secondary | ICD-10-CM | POA: Diagnosis not present

## 2015-12-20 DIAGNOSIS — I69859 Hemiplegia and hemiparesis following other cerebrovascular disease affecting unspecified side: Secondary | ICD-10-CM | POA: Diagnosis not present

## 2015-12-21 DIAGNOSIS — E1122 Type 2 diabetes mellitus with diabetic chronic kidney disease: Secondary | ICD-10-CM | POA: Diagnosis not present

## 2015-12-21 DIAGNOSIS — E162 Hypoglycemia, unspecified: Secondary | ICD-10-CM | POA: Diagnosis not present

## 2015-12-21 DIAGNOSIS — G40501 Epileptic seizures related to external causes, not intractable, with status epilepticus: Secondary | ICD-10-CM | POA: Diagnosis not present

## 2015-12-21 DIAGNOSIS — M6281 Muscle weakness (generalized): Secondary | ICD-10-CM | POA: Diagnosis not present

## 2015-12-21 DIAGNOSIS — I69859 Hemiplegia and hemiparesis following other cerebrovascular disease affecting unspecified side: Secondary | ICD-10-CM | POA: Diagnosis not present

## 2015-12-21 DIAGNOSIS — I639 Cerebral infarction, unspecified: Secondary | ICD-10-CM | POA: Diagnosis not present

## 2015-12-22 DIAGNOSIS — M6281 Muscle weakness (generalized): Secondary | ICD-10-CM | POA: Diagnosis not present

## 2015-12-22 DIAGNOSIS — E1122 Type 2 diabetes mellitus with diabetic chronic kidney disease: Secondary | ICD-10-CM | POA: Diagnosis not present

## 2015-12-22 DIAGNOSIS — G40501 Epileptic seizures related to external causes, not intractable, with status epilepticus: Secondary | ICD-10-CM | POA: Diagnosis not present

## 2015-12-22 DIAGNOSIS — I69859 Hemiplegia and hemiparesis following other cerebrovascular disease affecting unspecified side: Secondary | ICD-10-CM | POA: Diagnosis not present

## 2015-12-22 DIAGNOSIS — I639 Cerebral infarction, unspecified: Secondary | ICD-10-CM | POA: Diagnosis not present

## 2015-12-22 DIAGNOSIS — E162 Hypoglycemia, unspecified: Secondary | ICD-10-CM | POA: Diagnosis not present

## 2015-12-23 DIAGNOSIS — G40501 Epileptic seizures related to external causes, not intractable, with status epilepticus: Secondary | ICD-10-CM | POA: Diagnosis not present

## 2015-12-23 DIAGNOSIS — I69859 Hemiplegia and hemiparesis following other cerebrovascular disease affecting unspecified side: Secondary | ICD-10-CM | POA: Diagnosis not present

## 2015-12-23 DIAGNOSIS — I639 Cerebral infarction, unspecified: Secondary | ICD-10-CM | POA: Diagnosis not present

## 2015-12-23 DIAGNOSIS — M6281 Muscle weakness (generalized): Secondary | ICD-10-CM | POA: Diagnosis not present

## 2015-12-23 DIAGNOSIS — E1122 Type 2 diabetes mellitus with diabetic chronic kidney disease: Secondary | ICD-10-CM | POA: Diagnosis not present

## 2015-12-23 DIAGNOSIS — E162 Hypoglycemia, unspecified: Secondary | ICD-10-CM | POA: Diagnosis not present

## 2015-12-24 DIAGNOSIS — M6281 Muscle weakness (generalized): Secondary | ICD-10-CM | POA: Diagnosis not present

## 2015-12-24 DIAGNOSIS — E162 Hypoglycemia, unspecified: Secondary | ICD-10-CM | POA: Diagnosis not present

## 2015-12-24 DIAGNOSIS — I639 Cerebral infarction, unspecified: Secondary | ICD-10-CM | POA: Diagnosis not present

## 2015-12-24 DIAGNOSIS — I69859 Hemiplegia and hemiparesis following other cerebrovascular disease affecting unspecified side: Secondary | ICD-10-CM | POA: Diagnosis not present

## 2015-12-24 DIAGNOSIS — E1122 Type 2 diabetes mellitus with diabetic chronic kidney disease: Secondary | ICD-10-CM | POA: Diagnosis not present

## 2015-12-24 DIAGNOSIS — G40501 Epileptic seizures related to external causes, not intractable, with status epilepticus: Secondary | ICD-10-CM | POA: Diagnosis not present

## 2015-12-25 DIAGNOSIS — E162 Hypoglycemia, unspecified: Secondary | ICD-10-CM | POA: Diagnosis not present

## 2015-12-25 DIAGNOSIS — G40501 Epileptic seizures related to external causes, not intractable, with status epilepticus: Secondary | ICD-10-CM | POA: Diagnosis not present

## 2015-12-25 DIAGNOSIS — I639 Cerebral infarction, unspecified: Secondary | ICD-10-CM | POA: Diagnosis not present

## 2015-12-25 DIAGNOSIS — I69859 Hemiplegia and hemiparesis following other cerebrovascular disease affecting unspecified side: Secondary | ICD-10-CM | POA: Diagnosis not present

## 2015-12-25 DIAGNOSIS — E1122 Type 2 diabetes mellitus with diabetic chronic kidney disease: Secondary | ICD-10-CM | POA: Diagnosis not present

## 2015-12-25 DIAGNOSIS — M6281 Muscle weakness (generalized): Secondary | ICD-10-CM | POA: Diagnosis not present

## 2015-12-26 DIAGNOSIS — E162 Hypoglycemia, unspecified: Secondary | ICD-10-CM | POA: Diagnosis not present

## 2015-12-26 DIAGNOSIS — I639 Cerebral infarction, unspecified: Secondary | ICD-10-CM | POA: Diagnosis not present

## 2015-12-26 DIAGNOSIS — G40501 Epileptic seizures related to external causes, not intractable, with status epilepticus: Secondary | ICD-10-CM | POA: Diagnosis not present

## 2015-12-26 DIAGNOSIS — M6281 Muscle weakness (generalized): Secondary | ICD-10-CM | POA: Diagnosis not present

## 2015-12-26 DIAGNOSIS — I69859 Hemiplegia and hemiparesis following other cerebrovascular disease affecting unspecified side: Secondary | ICD-10-CM | POA: Diagnosis not present

## 2015-12-26 DIAGNOSIS — E1122 Type 2 diabetes mellitus with diabetic chronic kidney disease: Secondary | ICD-10-CM | POA: Diagnosis not present

## 2015-12-27 DIAGNOSIS — I639 Cerebral infarction, unspecified: Secondary | ICD-10-CM | POA: Diagnosis not present

## 2015-12-27 DIAGNOSIS — E162 Hypoglycemia, unspecified: Secondary | ICD-10-CM | POA: Diagnosis not present

## 2015-12-27 DIAGNOSIS — E1122 Type 2 diabetes mellitus with diabetic chronic kidney disease: Secondary | ICD-10-CM | POA: Diagnosis not present

## 2015-12-27 DIAGNOSIS — G40501 Epileptic seizures related to external causes, not intractable, with status epilepticus: Secondary | ICD-10-CM | POA: Diagnosis not present

## 2015-12-27 DIAGNOSIS — I69859 Hemiplegia and hemiparesis following other cerebrovascular disease affecting unspecified side: Secondary | ICD-10-CM | POA: Diagnosis not present

## 2015-12-27 DIAGNOSIS — M6281 Muscle weakness (generalized): Secondary | ICD-10-CM | POA: Diagnosis not present

## 2015-12-28 DIAGNOSIS — G40501 Epileptic seizures related to external causes, not intractable, with status epilepticus: Secondary | ICD-10-CM | POA: Diagnosis not present

## 2015-12-28 DIAGNOSIS — R1314 Dysphagia, pharyngoesophageal phase: Secondary | ICD-10-CM | POA: Insufficient documentation

## 2015-12-28 DIAGNOSIS — I69859 Hemiplegia and hemiparesis following other cerebrovascular disease affecting unspecified side: Secondary | ICD-10-CM | POA: Diagnosis not present

## 2015-12-28 DIAGNOSIS — M6281 Muscle weakness (generalized): Secondary | ICD-10-CM | POA: Diagnosis not present

## 2015-12-28 DIAGNOSIS — I639 Cerebral infarction, unspecified: Secondary | ICD-10-CM | POA: Diagnosis not present

## 2015-12-28 DIAGNOSIS — E1122 Type 2 diabetes mellitus with diabetic chronic kidney disease: Secondary | ICD-10-CM | POA: Diagnosis not present

## 2015-12-28 DIAGNOSIS — E162 Hypoglycemia, unspecified: Secondary | ICD-10-CM | POA: Diagnosis not present

## 2015-12-29 DIAGNOSIS — E162 Hypoglycemia, unspecified: Secondary | ICD-10-CM | POA: Diagnosis not present

## 2015-12-29 DIAGNOSIS — M6281 Muscle weakness (generalized): Secondary | ICD-10-CM | POA: Diagnosis not present

## 2015-12-29 DIAGNOSIS — I639 Cerebral infarction, unspecified: Secondary | ICD-10-CM | POA: Diagnosis not present

## 2015-12-29 DIAGNOSIS — E1122 Type 2 diabetes mellitus with diabetic chronic kidney disease: Secondary | ICD-10-CM | POA: Diagnosis not present

## 2015-12-29 DIAGNOSIS — G40501 Epileptic seizures related to external causes, not intractable, with status epilepticus: Secondary | ICD-10-CM | POA: Diagnosis not present

## 2015-12-29 DIAGNOSIS — I69859 Hemiplegia and hemiparesis following other cerebrovascular disease affecting unspecified side: Secondary | ICD-10-CM | POA: Diagnosis not present

## 2015-12-30 DIAGNOSIS — E1122 Type 2 diabetes mellitus with diabetic chronic kidney disease: Secondary | ICD-10-CM | POA: Diagnosis not present

## 2015-12-30 DIAGNOSIS — I639 Cerebral infarction, unspecified: Secondary | ICD-10-CM | POA: Diagnosis not present

## 2015-12-30 DIAGNOSIS — G40501 Epileptic seizures related to external causes, not intractable, with status epilepticus: Secondary | ICD-10-CM | POA: Diagnosis not present

## 2015-12-30 DIAGNOSIS — M6281 Muscle weakness (generalized): Secondary | ICD-10-CM | POA: Diagnosis not present

## 2015-12-30 DIAGNOSIS — I69859 Hemiplegia and hemiparesis following other cerebrovascular disease affecting unspecified side: Secondary | ICD-10-CM | POA: Diagnosis not present

## 2015-12-30 DIAGNOSIS — E162 Hypoglycemia, unspecified: Secondary | ICD-10-CM | POA: Diagnosis not present

## 2015-12-31 DIAGNOSIS — M6281 Muscle weakness (generalized): Secondary | ICD-10-CM | POA: Diagnosis not present

## 2015-12-31 DIAGNOSIS — E162 Hypoglycemia, unspecified: Secondary | ICD-10-CM | POA: Diagnosis not present

## 2015-12-31 DIAGNOSIS — E1122 Type 2 diabetes mellitus with diabetic chronic kidney disease: Secondary | ICD-10-CM | POA: Diagnosis not present

## 2015-12-31 DIAGNOSIS — I639 Cerebral infarction, unspecified: Secondary | ICD-10-CM | POA: Diagnosis not present

## 2015-12-31 DIAGNOSIS — I69859 Hemiplegia and hemiparesis following other cerebrovascular disease affecting unspecified side: Secondary | ICD-10-CM | POA: Diagnosis not present

## 2015-12-31 DIAGNOSIS — G40501 Epileptic seizures related to external causes, not intractable, with status epilepticus: Secondary | ICD-10-CM | POA: Diagnosis not present

## 2016-01-01 DIAGNOSIS — I69859 Hemiplegia and hemiparesis following other cerebrovascular disease affecting unspecified side: Secondary | ICD-10-CM | POA: Diagnosis not present

## 2016-01-01 DIAGNOSIS — M6281 Muscle weakness (generalized): Secondary | ICD-10-CM | POA: Diagnosis not present

## 2016-01-01 DIAGNOSIS — G40501 Epileptic seizures related to external causes, not intractable, with status epilepticus: Secondary | ICD-10-CM | POA: Diagnosis not present

## 2016-01-01 DIAGNOSIS — E162 Hypoglycemia, unspecified: Secondary | ICD-10-CM | POA: Diagnosis not present

## 2016-01-01 DIAGNOSIS — I639 Cerebral infarction, unspecified: Secondary | ICD-10-CM | POA: Diagnosis not present

## 2016-01-01 DIAGNOSIS — E1122 Type 2 diabetes mellitus with diabetic chronic kidney disease: Secondary | ICD-10-CM | POA: Diagnosis not present

## 2016-01-02 DIAGNOSIS — I639 Cerebral infarction, unspecified: Secondary | ICD-10-CM | POA: Diagnosis not present

## 2016-01-02 DIAGNOSIS — E1122 Type 2 diabetes mellitus with diabetic chronic kidney disease: Secondary | ICD-10-CM | POA: Diagnosis not present

## 2016-01-02 DIAGNOSIS — G40501 Epileptic seizures related to external causes, not intractable, with status epilepticus: Secondary | ICD-10-CM | POA: Diagnosis not present

## 2016-01-02 DIAGNOSIS — I69859 Hemiplegia and hemiparesis following other cerebrovascular disease affecting unspecified side: Secondary | ICD-10-CM | POA: Diagnosis not present

## 2016-01-02 DIAGNOSIS — E162 Hypoglycemia, unspecified: Secondary | ICD-10-CM | POA: Diagnosis not present

## 2016-01-02 DIAGNOSIS — M6281 Muscle weakness (generalized): Secondary | ICD-10-CM | POA: Diagnosis not present

## 2016-01-03 DIAGNOSIS — I639 Cerebral infarction, unspecified: Secondary | ICD-10-CM | POA: Diagnosis not present

## 2016-01-03 DIAGNOSIS — G40501 Epileptic seizures related to external causes, not intractable, with status epilepticus: Secondary | ICD-10-CM | POA: Diagnosis not present

## 2016-01-03 DIAGNOSIS — E162 Hypoglycemia, unspecified: Secondary | ICD-10-CM | POA: Diagnosis not present

## 2016-01-03 DIAGNOSIS — I69859 Hemiplegia and hemiparesis following other cerebrovascular disease affecting unspecified side: Secondary | ICD-10-CM | POA: Diagnosis not present

## 2016-01-03 DIAGNOSIS — M6281 Muscle weakness (generalized): Secondary | ICD-10-CM | POA: Diagnosis not present

## 2016-01-03 DIAGNOSIS — E1122 Type 2 diabetes mellitus with diabetic chronic kidney disease: Secondary | ICD-10-CM | POA: Diagnosis not present

## 2016-01-06 DIAGNOSIS — I639 Cerebral infarction, unspecified: Secondary | ICD-10-CM | POA: Diagnosis not present

## 2016-01-06 DIAGNOSIS — M6281 Muscle weakness (generalized): Secondary | ICD-10-CM | POA: Diagnosis not present

## 2016-01-06 DIAGNOSIS — E1122 Type 2 diabetes mellitus with diabetic chronic kidney disease: Secondary | ICD-10-CM | POA: Diagnosis not present

## 2016-01-06 DIAGNOSIS — E162 Hypoglycemia, unspecified: Secondary | ICD-10-CM | POA: Diagnosis not present

## 2016-01-06 DIAGNOSIS — G40501 Epileptic seizures related to external causes, not intractable, with status epilepticus: Secondary | ICD-10-CM | POA: Diagnosis not present

## 2016-01-06 DIAGNOSIS — I69859 Hemiplegia and hemiparesis following other cerebrovascular disease affecting unspecified side: Secondary | ICD-10-CM | POA: Diagnosis not present

## 2016-01-07 DIAGNOSIS — G40501 Epileptic seizures related to external causes, not intractable, with status epilepticus: Secondary | ICD-10-CM | POA: Diagnosis not present

## 2016-01-07 DIAGNOSIS — M6281 Muscle weakness (generalized): Secondary | ICD-10-CM | POA: Diagnosis not present

## 2016-01-07 DIAGNOSIS — E1122 Type 2 diabetes mellitus with diabetic chronic kidney disease: Secondary | ICD-10-CM | POA: Diagnosis not present

## 2016-01-07 DIAGNOSIS — I639 Cerebral infarction, unspecified: Secondary | ICD-10-CM | POA: Diagnosis not present

## 2016-01-07 DIAGNOSIS — I69859 Hemiplegia and hemiparesis following other cerebrovascular disease affecting unspecified side: Secondary | ICD-10-CM | POA: Diagnosis not present

## 2016-01-07 DIAGNOSIS — E162 Hypoglycemia, unspecified: Secondary | ICD-10-CM | POA: Diagnosis not present

## 2016-01-08 DIAGNOSIS — E1122 Type 2 diabetes mellitus with diabetic chronic kidney disease: Secondary | ICD-10-CM | POA: Diagnosis not present

## 2016-01-08 DIAGNOSIS — G40501 Epileptic seizures related to external causes, not intractable, with status epilepticus: Secondary | ICD-10-CM | POA: Diagnosis not present

## 2016-01-08 DIAGNOSIS — I639 Cerebral infarction, unspecified: Secondary | ICD-10-CM | POA: Diagnosis not present

## 2016-01-08 DIAGNOSIS — I69859 Hemiplegia and hemiparesis following other cerebrovascular disease affecting unspecified side: Secondary | ICD-10-CM | POA: Diagnosis not present

## 2016-01-08 DIAGNOSIS — E162 Hypoglycemia, unspecified: Secondary | ICD-10-CM | POA: Diagnosis not present

## 2016-01-08 DIAGNOSIS — M6281 Muscle weakness (generalized): Secondary | ICD-10-CM | POA: Diagnosis not present

## 2016-01-09 DIAGNOSIS — I639 Cerebral infarction, unspecified: Secondary | ICD-10-CM | POA: Diagnosis not present

## 2016-01-09 DIAGNOSIS — E162 Hypoglycemia, unspecified: Secondary | ICD-10-CM | POA: Diagnosis not present

## 2016-01-09 DIAGNOSIS — I69859 Hemiplegia and hemiparesis following other cerebrovascular disease affecting unspecified side: Secondary | ICD-10-CM | POA: Diagnosis not present

## 2016-01-09 DIAGNOSIS — G40501 Epileptic seizures related to external causes, not intractable, with status epilepticus: Secondary | ICD-10-CM | POA: Diagnosis not present

## 2016-01-09 DIAGNOSIS — E1122 Type 2 diabetes mellitus with diabetic chronic kidney disease: Secondary | ICD-10-CM | POA: Diagnosis not present

## 2016-01-09 DIAGNOSIS — M6281 Muscle weakness (generalized): Secondary | ICD-10-CM | POA: Diagnosis not present

## 2016-01-10 DIAGNOSIS — I69859 Hemiplegia and hemiparesis following other cerebrovascular disease affecting unspecified side: Secondary | ICD-10-CM | POA: Diagnosis not present

## 2016-01-10 DIAGNOSIS — M6281 Muscle weakness (generalized): Secondary | ICD-10-CM | POA: Diagnosis not present

## 2016-01-10 DIAGNOSIS — I639 Cerebral infarction, unspecified: Secondary | ICD-10-CM | POA: Diagnosis not present

## 2016-01-10 DIAGNOSIS — E162 Hypoglycemia, unspecified: Secondary | ICD-10-CM | POA: Diagnosis not present

## 2016-01-10 DIAGNOSIS — E1122 Type 2 diabetes mellitus with diabetic chronic kidney disease: Secondary | ICD-10-CM | POA: Diagnosis not present

## 2016-01-10 DIAGNOSIS — G40501 Epileptic seizures related to external causes, not intractable, with status epilepticus: Secondary | ICD-10-CM | POA: Diagnosis not present

## 2016-01-11 DIAGNOSIS — I639 Cerebral infarction, unspecified: Secondary | ICD-10-CM | POA: Diagnosis not present

## 2016-01-11 DIAGNOSIS — M6281 Muscle weakness (generalized): Secondary | ICD-10-CM | POA: Diagnosis not present

## 2016-01-11 DIAGNOSIS — I69859 Hemiplegia and hemiparesis following other cerebrovascular disease affecting unspecified side: Secondary | ICD-10-CM | POA: Diagnosis not present

## 2016-01-11 DIAGNOSIS — E1122 Type 2 diabetes mellitus with diabetic chronic kidney disease: Secondary | ICD-10-CM | POA: Diagnosis not present

## 2016-01-11 DIAGNOSIS — E162 Hypoglycemia, unspecified: Secondary | ICD-10-CM | POA: Diagnosis not present

## 2016-01-11 DIAGNOSIS — G40501 Epileptic seizures related to external causes, not intractable, with status epilepticus: Secondary | ICD-10-CM | POA: Diagnosis not present

## 2016-01-13 DIAGNOSIS — I69859 Hemiplegia and hemiparesis following other cerebrovascular disease affecting unspecified side: Secondary | ICD-10-CM | POA: Diagnosis not present

## 2016-01-13 DIAGNOSIS — M6281 Muscle weakness (generalized): Secondary | ICD-10-CM | POA: Diagnosis not present

## 2016-01-13 DIAGNOSIS — E162 Hypoglycemia, unspecified: Secondary | ICD-10-CM | POA: Diagnosis not present

## 2016-01-13 DIAGNOSIS — G40501 Epileptic seizures related to external causes, not intractable, with status epilepticus: Secondary | ICD-10-CM | POA: Diagnosis not present

## 2016-01-13 DIAGNOSIS — E1122 Type 2 diabetes mellitus with diabetic chronic kidney disease: Secondary | ICD-10-CM | POA: Diagnosis not present

## 2016-01-13 DIAGNOSIS — I639 Cerebral infarction, unspecified: Secondary | ICD-10-CM | POA: Diagnosis not present

## 2016-01-14 DIAGNOSIS — I69859 Hemiplegia and hemiparesis following other cerebrovascular disease affecting unspecified side: Secondary | ICD-10-CM | POA: Diagnosis not present

## 2016-01-14 DIAGNOSIS — E162 Hypoglycemia, unspecified: Secondary | ICD-10-CM | POA: Diagnosis not present

## 2016-01-14 DIAGNOSIS — M6281 Muscle weakness (generalized): Secondary | ICD-10-CM | POA: Diagnosis not present

## 2016-01-14 DIAGNOSIS — G40501 Epileptic seizures related to external causes, not intractable, with status epilepticus: Secondary | ICD-10-CM | POA: Diagnosis not present

## 2016-01-14 DIAGNOSIS — I639 Cerebral infarction, unspecified: Secondary | ICD-10-CM | POA: Diagnosis not present

## 2016-01-14 DIAGNOSIS — E1122 Type 2 diabetes mellitus with diabetic chronic kidney disease: Secondary | ICD-10-CM | POA: Diagnosis not present

## 2016-01-15 DIAGNOSIS — G40501 Epileptic seizures related to external causes, not intractable, with status epilepticus: Secondary | ICD-10-CM | POA: Diagnosis not present

## 2016-01-15 DIAGNOSIS — M6281 Muscle weakness (generalized): Secondary | ICD-10-CM | POA: Diagnosis not present

## 2016-01-15 DIAGNOSIS — E162 Hypoglycemia, unspecified: Secondary | ICD-10-CM | POA: Diagnosis not present

## 2016-01-15 DIAGNOSIS — I69859 Hemiplegia and hemiparesis following other cerebrovascular disease affecting unspecified side: Secondary | ICD-10-CM | POA: Diagnosis not present

## 2016-01-15 DIAGNOSIS — I639 Cerebral infarction, unspecified: Secondary | ICD-10-CM | POA: Diagnosis not present

## 2016-01-15 DIAGNOSIS — E1122 Type 2 diabetes mellitus with diabetic chronic kidney disease: Secondary | ICD-10-CM | POA: Diagnosis not present

## 2016-01-16 ENCOUNTER — Non-Acute Institutional Stay (SKILLED_NURSING_FACILITY): Payer: Medicare Other | Admitting: Adult Health

## 2016-01-16 ENCOUNTER — Encounter: Payer: Self-pay | Admitting: Adult Health

## 2016-01-16 DIAGNOSIS — E034 Atrophy of thyroid (acquired): Secondary | ICD-10-CM | POA: Diagnosis not present

## 2016-01-16 DIAGNOSIS — M6281 Muscle weakness (generalized): Secondary | ICD-10-CM | POA: Diagnosis not present

## 2016-01-16 DIAGNOSIS — I639 Cerebral infarction, unspecified: Secondary | ICD-10-CM | POA: Diagnosis not present

## 2016-01-16 DIAGNOSIS — C50912 Malignant neoplasm of unspecified site of left female breast: Secondary | ICD-10-CM | POA: Diagnosis not present

## 2016-01-16 DIAGNOSIS — G40501 Epileptic seizures related to external causes, not intractable, with status epilepticus: Secondary | ICD-10-CM | POA: Diagnosis not present

## 2016-01-16 DIAGNOSIS — R1314 Dysphagia, pharyngoesophageal phase: Secondary | ICD-10-CM | POA: Diagnosis not present

## 2016-01-16 DIAGNOSIS — F0150 Vascular dementia without behavioral disturbance: Secondary | ICD-10-CM | POA: Diagnosis not present

## 2016-01-16 DIAGNOSIS — I69354 Hemiplegia and hemiparesis following cerebral infarction affecting left non-dominant side: Secondary | ICD-10-CM | POA: Diagnosis not present

## 2016-01-16 DIAGNOSIS — N183 Chronic kidney disease, stage 3 unspecified: Secondary | ICD-10-CM

## 2016-01-16 DIAGNOSIS — I6330 Cerebral infarction due to thrombosis of unspecified cerebral artery: Secondary | ICD-10-CM | POA: Diagnosis not present

## 2016-01-16 DIAGNOSIS — D631 Anemia in chronic kidney disease: Secondary | ICD-10-CM | POA: Diagnosis not present

## 2016-01-16 DIAGNOSIS — E162 Hypoglycemia, unspecified: Secondary | ICD-10-CM | POA: Diagnosis not present

## 2016-01-16 DIAGNOSIS — I10 Essential (primary) hypertension: Secondary | ICD-10-CM | POA: Diagnosis not present

## 2016-01-16 DIAGNOSIS — E1122 Type 2 diabetes mellitus with diabetic chronic kidney disease: Secondary | ICD-10-CM

## 2016-01-16 DIAGNOSIS — I69859 Hemiplegia and hemiparesis following other cerebrovascular disease affecting unspecified side: Secondary | ICD-10-CM | POA: Diagnosis not present

## 2016-01-16 NOTE — Progress Notes (Signed)
Location:   [facility] Room Number: 117 A Place of Service:  SNF (31)   CODE STATUS: Full Code  Allergies  Allergen Reactions  . Aspirin Hives  . Penicillins Hives    Chief Complaint  Patient presents with  . Medical Management of Chronic Issues    HPI:  She is being seen for the management of her chronic illnesses. She tells me that she is feeling and is ready to get out of the bed. She does get up every day. There are no nursing concerns at this time.    Past Medical History:  Diagnosis Date  . Anemia   . Arthritis   . Chronic kidney disease   . Diabetes mellitus without complication ([place])   . Hemorrhoid   . Hypertension   . Stroke ([place])   . Thyroid disease     Past Surgical History:  Procedure Laterality Date  . ABDOMINAL HYSTERECTOMY     PARTIAL  . BREAST LUMPECTOMY     LEFT  . MASTECTOMY  05/2008   Left    Social History   Social History  . Marital status: Single    Spouse name: N/A  . Number of children: N/A  . Years of education: N/A   Occupational History  . Not on file.   Social History Main Topics  . Smoking status: Never Smoker  . Smokeless tobacco: Not on file  . Alcohol use No  . Drug use: No  . Sexual activity: Not on file   Other Topics Concern  . Not on file   Social History Narrative  . No narrative on file   History reviewed. No pertinent family history.    VITAL SIGNS BP 138/88   Pulse 68   Temp 98.2 F (36.8 C)   Resp 18   Ht 5\' 1"  (1.549 m)   Wt 170 lb 3.2 oz (77.2 kg)   SpO2 97%   BMI 32.16 kg/m   Patient's Medications  New Prescriptions   No medications on file  Previous Medications   ASCORBIC ACID (VITAMIN C) 1000 MG TABLET    Take 1,000 mg by mouth daily.   CHOLECALCIFEROL (VITAMIN D) 2000 UNITS TABLET    Take 2,000 Units by mouth daily.   CLOPIDOGREL (PLAVIX) 75 MG TABLET    Take 1 tablet (75 mg total) by mouth daily.   DONEPEZIL (ARICEPT) 5 MG TABLET    Take 5 mg by mouth at bedtime.     FERROUS SULFATE 325 (65 FE) MG TABLET    Take 325 mg by mouth 2 (two) times daily with a meal.   HYDROCODONE-ACETAMINOPHEN (NORCO) 10-325 MG PER TABLET    Take 1 tablet by mouth 2 (two) times daily.   LEVOTHYROXINE (SYNTHROID, LEVOTHROID) 137 MCG TABLET    Take 137 mcg by mouth daily before breakfast.   LISINOPRIL (PRINIVIL,ZESTRIL) 40 MG TABLET    Take 40 mg by mouth daily.   LORAZEPAM (ATIVAN) 0.5 MG TABLET    Give one tablet 1 time daily, give 1 tablet by mouth as needed two times daily   METOPROLOL TARTRATE (LOPRESSOR) 25 MG TABLET    Take 12.5 mg by mouth 2 (two) times daily.   NUTRITIONAL SUPPLEMENTS (NUTRITIONAL SUPPLEMENT PO)    Take 120 mLs by mouth 4 (four) times daily. Puree texture, regular consistency   PAROXETINE (PAXIL) 40 MG TABLET    Take 40 mg by mouth daily.   POLLEN EXTRACTS (PROSTAT PO)    Take by  mouth. 30 cc by mouth BID for albumin 2.7   POTASSIUM CHLORIDE (K-DUR) 10 MEQ TABLET    Take 10 mEq by mouth daily.   ROPINIROLE (REQUIP) 3 MG TABLET    Take 3 mg by mouth at bedtime.    VITAMIN B-12 1000 MCG TABLET    Take 1 tablet (1,000 mcg total) by mouth daily.  Modified Medications   No medications on file  Discontinued Medications   No medications on file     SIGNIFICANT DIAGNOSTIC EXAMS   09-19-14: left knee x-ray: Severe knee osteoarthritis with lateral and posterior tibial translation.  09-19-14: bilateral hip and pelvis x-ray: 1. Mild degenerative changes within both hips. 2. No acute abnormality. 3. Degenerative changes in the lumbar spine. 4. Atherosclerosis.  03-25-15: chest x-ray: no acute cardiopulmonary disease; no pneumonia    LABS REVIEWED:   01-25-15: glucose 87; bun 19; creat 1.45; k+ 4.8; na++141; liver normal albumin 2.7  04-11-15: glucose 108; bun 22; creat 1.40; k+ 5.5; na++140  04-17-15; glucose 84; bun 21; creat 1.22; k+ 5.1; na++139  05-24-15: wbc 5.0; hgb 8.3; hct 27.1; mcv 79.2; plt 200; glucose 95; bun 35; creat 1.36; k+ 4.2; na++ 140 liver  normal albumin 2.9; chol 111; ldl 51; trig 75; hdl 45; tsh 4.38; hgb a1c 5.5; urine micro-albumin 23.6  08-31-15: wbc 9.7; hgb 11.1; hct 35.2; mcv 78.6; plt 275; glucose 210; bun 31; creat 1.57; k+ 4.6; na++ 142 09-04-15: urine culture: 1,000 colonies  e-coli  09-18-15: tsh 97.79; free T3: 1.5; free T4: 0.6  10-25-15:tsh 36.56: free T4: 0.8; free T3: 1.8  12-17-15: tsh 7.45; free T3: 2.4; free T4: 1.19     Review of Systems  Unable to perform ROS: Other      Physical Exam  Constitutional: No distress.  Eyes: Conjunctivae are normal.  Neck: Neck supple. No JVD present. No thyromegaly present.  Cardiovascular: Normal rate, regular rhythm and intact distal pulses.  S3 S4 split  Respiratory: Effort normal and breath sounds normal. No respiratory distress. She has no wheezes.  GI: Soft. Bowel sounds are normal. She exhibits no distension. There is no tenderness.  Musculoskeletal: She exhibits no edema.  Left hemiparesis    Lymphadenopathy:    She has no cervical adenopathy.  Neurological: She is alert.  Skin: Skin is warm and dry. She is not diaphoretic.   Psychiatric: She has a normal mood and affect.     ASSESSMENT/ PLAN:  1. CVA: with left hemiparesis: is neurologically stable; will continue plavix 75 mg daily takes vicodin 10/325 mg twice daily for pain management and will monitor   2. Hypothyroidism: will continue synthroid 137 mcg daily and will monitor her free t4: 1.19; tsh 7.45  3. Anemia: is presently stable will continue iron twice daily; vit b12 1000 mcg daily  hgb 11.1 iron level is up to 22 from  13; will monitor  4. Diabetes:  hgb a1c 5.5   Will not make changes will monitor her status.   5. Hypertension: her blood pressure is elevated will continue  lopressor 12.5 mg twice daily  lisinopril  40 mg daily   6. Restless leg syndrome: will continue requip  3 mg nightly   7.  Depression with anxiety: will continue paxil 40 mg daily has ativan 0.5 mg three times daily  as needed will monitor   8. ckd stage III:  Her bun/creat is 35/1.57; will monitor   9. Hypokalemia: will continue  k+ to 10 meq daily  K+ is 4.6  10. Left breast cancer T1N1 ER/PR +, HER 2 negative, 2 sentinel nodes involved:  post left mastectomy with 3 node axillary evaluation and 5 years of [facility] thru 06-2013. No known active breast cancer Will monitor   11. Dysphagia: no signs of aspiration present; will continue nectar thick liquids.    MD is aware of resident's narcotic use and is in agreement with current plan of care. We will attempt to wean resident as apropriate   Ok Edwards NP [facility]  Contact [phone] Monday through Friday 8am- 5pm  After hours call [phone]

## 2016-01-17 DIAGNOSIS — G40501 Epileptic seizures related to external causes, not intractable, with status epilepticus: Secondary | ICD-10-CM | POA: Diagnosis not present

## 2016-01-17 DIAGNOSIS — E1122 Type 2 diabetes mellitus with diabetic chronic kidney disease: Secondary | ICD-10-CM | POA: Diagnosis not present

## 2016-01-17 DIAGNOSIS — I639 Cerebral infarction, unspecified: Secondary | ICD-10-CM | POA: Diagnosis not present

## 2016-01-17 DIAGNOSIS — E162 Hypoglycemia, unspecified: Secondary | ICD-10-CM | POA: Diagnosis not present

## 2016-01-17 DIAGNOSIS — I69859 Hemiplegia and hemiparesis following other cerebrovascular disease affecting unspecified side: Secondary | ICD-10-CM | POA: Diagnosis not present

## 2016-01-17 DIAGNOSIS — M6281 Muscle weakness (generalized): Secondary | ICD-10-CM | POA: Diagnosis not present

## 2016-01-18 DIAGNOSIS — I639 Cerebral infarction, unspecified: Secondary | ICD-10-CM | POA: Diagnosis not present

## 2016-01-18 DIAGNOSIS — E1122 Type 2 diabetes mellitus with diabetic chronic kidney disease: Secondary | ICD-10-CM | POA: Diagnosis not present

## 2016-01-18 DIAGNOSIS — G40501 Epileptic seizures related to external causes, not intractable, with status epilepticus: Secondary | ICD-10-CM | POA: Diagnosis not present

## 2016-01-18 DIAGNOSIS — M6281 Muscle weakness (generalized): Secondary | ICD-10-CM | POA: Diagnosis not present

## 2016-01-18 DIAGNOSIS — E162 Hypoglycemia, unspecified: Secondary | ICD-10-CM | POA: Diagnosis not present

## 2016-01-18 DIAGNOSIS — I69859 Hemiplegia and hemiparesis following other cerebrovascular disease affecting unspecified side: Secondary | ICD-10-CM | POA: Diagnosis not present

## 2016-01-20 DIAGNOSIS — R1312 Dysphagia, oropharyngeal phase: Secondary | ICD-10-CM | POA: Diagnosis not present

## 2016-01-20 DIAGNOSIS — E1122 Type 2 diabetes mellitus with diabetic chronic kidney disease: Secondary | ICD-10-CM | POA: Diagnosis not present

## 2016-01-20 DIAGNOSIS — E162 Hypoglycemia, unspecified: Secondary | ICD-10-CM | POA: Diagnosis not present

## 2016-01-20 DIAGNOSIS — M6281 Muscle weakness (generalized): Secondary | ICD-10-CM | POA: Diagnosis not present

## 2016-01-20 DIAGNOSIS — R489 Unspecified symbolic dysfunctions: Secondary | ICD-10-CM | POA: Diagnosis not present

## 2016-01-20 DIAGNOSIS — I639 Cerebral infarction, unspecified: Secondary | ICD-10-CM | POA: Diagnosis not present

## 2016-01-20 DIAGNOSIS — F339 Major depressive disorder, recurrent, unspecified: Secondary | ICD-10-CM | POA: Diagnosis not present

## 2016-01-20 DIAGNOSIS — G40501 Epileptic seizures related to external causes, not intractable, with status epilepticus: Secondary | ICD-10-CM | POA: Diagnosis not present

## 2016-01-20 DIAGNOSIS — I69859 Hemiplegia and hemiparesis following other cerebrovascular disease affecting unspecified side: Secondary | ICD-10-CM | POA: Diagnosis not present

## 2016-01-21 DIAGNOSIS — E1122 Type 2 diabetes mellitus with diabetic chronic kidney disease: Secondary | ICD-10-CM | POA: Diagnosis not present

## 2016-01-21 DIAGNOSIS — G40501 Epileptic seizures related to external causes, not intractable, with status epilepticus: Secondary | ICD-10-CM | POA: Diagnosis not present

## 2016-01-21 DIAGNOSIS — M6281 Muscle weakness (generalized): Secondary | ICD-10-CM | POA: Diagnosis not present

## 2016-01-21 DIAGNOSIS — I639 Cerebral infarction, unspecified: Secondary | ICD-10-CM | POA: Diagnosis not present

## 2016-01-21 DIAGNOSIS — E162 Hypoglycemia, unspecified: Secondary | ICD-10-CM | POA: Diagnosis not present

## 2016-01-21 DIAGNOSIS — I69859 Hemiplegia and hemiparesis following other cerebrovascular disease affecting unspecified side: Secondary | ICD-10-CM | POA: Diagnosis not present

## 2016-01-22 DIAGNOSIS — G40501 Epileptic seizures related to external causes, not intractable, with status epilepticus: Secondary | ICD-10-CM | POA: Diagnosis not present

## 2016-01-22 DIAGNOSIS — M6281 Muscle weakness (generalized): Secondary | ICD-10-CM | POA: Diagnosis not present

## 2016-01-22 DIAGNOSIS — I639 Cerebral infarction, unspecified: Secondary | ICD-10-CM | POA: Diagnosis not present

## 2016-01-22 DIAGNOSIS — I69859 Hemiplegia and hemiparesis following other cerebrovascular disease affecting unspecified side: Secondary | ICD-10-CM | POA: Diagnosis not present

## 2016-01-22 DIAGNOSIS — E1122 Type 2 diabetes mellitus with diabetic chronic kidney disease: Secondary | ICD-10-CM | POA: Diagnosis not present

## 2016-01-22 DIAGNOSIS — E162 Hypoglycemia, unspecified: Secondary | ICD-10-CM | POA: Diagnosis not present

## 2016-01-23 DIAGNOSIS — I639 Cerebral infarction, unspecified: Secondary | ICD-10-CM | POA: Diagnosis not present

## 2016-01-23 DIAGNOSIS — I69859 Hemiplegia and hemiparesis following other cerebrovascular disease affecting unspecified side: Secondary | ICD-10-CM | POA: Diagnosis not present

## 2016-01-23 DIAGNOSIS — G40501 Epileptic seizures related to external causes, not intractable, with status epilepticus: Secondary | ICD-10-CM | POA: Diagnosis not present

## 2016-01-23 DIAGNOSIS — E1122 Type 2 diabetes mellitus with diabetic chronic kidney disease: Secondary | ICD-10-CM | POA: Diagnosis not present

## 2016-01-23 DIAGNOSIS — E162 Hypoglycemia, unspecified: Secondary | ICD-10-CM | POA: Diagnosis not present

## 2016-01-23 DIAGNOSIS — M6281 Muscle weakness (generalized): Secondary | ICD-10-CM | POA: Diagnosis not present

## 2016-01-24 DIAGNOSIS — G40501 Epileptic seizures related to external causes, not intractable, with status epilepticus: Secondary | ICD-10-CM | POA: Diagnosis not present

## 2016-01-24 DIAGNOSIS — I639 Cerebral infarction, unspecified: Secondary | ICD-10-CM | POA: Diagnosis not present

## 2016-01-24 DIAGNOSIS — M6281 Muscle weakness (generalized): Secondary | ICD-10-CM | POA: Diagnosis not present

## 2016-01-24 DIAGNOSIS — E1122 Type 2 diabetes mellitus with diabetic chronic kidney disease: Secondary | ICD-10-CM | POA: Diagnosis not present

## 2016-01-24 DIAGNOSIS — I69859 Hemiplegia and hemiparesis following other cerebrovascular disease affecting unspecified side: Secondary | ICD-10-CM | POA: Diagnosis not present

## 2016-01-24 DIAGNOSIS — E162 Hypoglycemia, unspecified: Secondary | ICD-10-CM | POA: Diagnosis not present

## 2016-01-26 DIAGNOSIS — M6281 Muscle weakness (generalized): Secondary | ICD-10-CM | POA: Diagnosis not present

## 2016-01-26 DIAGNOSIS — G40501 Epileptic seizures related to external causes, not intractable, with status epilepticus: Secondary | ICD-10-CM | POA: Diagnosis not present

## 2016-01-26 DIAGNOSIS — I639 Cerebral infarction, unspecified: Secondary | ICD-10-CM | POA: Diagnosis not present

## 2016-01-26 DIAGNOSIS — E1122 Type 2 diabetes mellitus with diabetic chronic kidney disease: Secondary | ICD-10-CM | POA: Diagnosis not present

## 2016-01-26 DIAGNOSIS — I69859 Hemiplegia and hemiparesis following other cerebrovascular disease affecting unspecified side: Secondary | ICD-10-CM | POA: Diagnosis not present

## 2016-01-26 DIAGNOSIS — E162 Hypoglycemia, unspecified: Secondary | ICD-10-CM | POA: Diagnosis not present

## 2016-01-27 DIAGNOSIS — I69859 Hemiplegia and hemiparesis following other cerebrovascular disease affecting unspecified side: Secondary | ICD-10-CM | POA: Diagnosis not present

## 2016-01-27 DIAGNOSIS — I639 Cerebral infarction, unspecified: Secondary | ICD-10-CM | POA: Diagnosis not present

## 2016-01-27 DIAGNOSIS — G40501 Epileptic seizures related to external causes, not intractable, with status epilepticus: Secondary | ICD-10-CM | POA: Diagnosis not present

## 2016-01-27 DIAGNOSIS — E1122 Type 2 diabetes mellitus with diabetic chronic kidney disease: Secondary | ICD-10-CM | POA: Diagnosis not present

## 2016-01-27 DIAGNOSIS — M6281 Muscle weakness (generalized): Secondary | ICD-10-CM | POA: Diagnosis not present

## 2016-01-27 DIAGNOSIS — E162 Hypoglycemia, unspecified: Secondary | ICD-10-CM | POA: Diagnosis not present

## 2016-01-28 DIAGNOSIS — I69859 Hemiplegia and hemiparesis following other cerebrovascular disease affecting unspecified side: Secondary | ICD-10-CM | POA: Diagnosis not present

## 2016-01-28 DIAGNOSIS — I639 Cerebral infarction, unspecified: Secondary | ICD-10-CM | POA: Diagnosis not present

## 2016-01-28 DIAGNOSIS — E162 Hypoglycemia, unspecified: Secondary | ICD-10-CM | POA: Diagnosis not present

## 2016-01-28 DIAGNOSIS — M6281 Muscle weakness (generalized): Secondary | ICD-10-CM | POA: Diagnosis not present

## 2016-01-28 DIAGNOSIS — G40501 Epileptic seizures related to external causes, not intractable, with status epilepticus: Secondary | ICD-10-CM | POA: Diagnosis not present

## 2016-01-28 DIAGNOSIS — E1122 Type 2 diabetes mellitus with diabetic chronic kidney disease: Secondary | ICD-10-CM | POA: Diagnosis not present

## 2016-01-30 DIAGNOSIS — Z79899 Other long term (current) drug therapy: Secondary | ICD-10-CM | POA: Diagnosis not present

## 2016-01-30 DIAGNOSIS — E1122 Type 2 diabetes mellitus with diabetic chronic kidney disease: Secondary | ICD-10-CM | POA: Diagnosis not present

## 2016-01-30 DIAGNOSIS — I69859 Hemiplegia and hemiparesis following other cerebrovascular disease affecting unspecified side: Secondary | ICD-10-CM | POA: Diagnosis not present

## 2016-01-30 DIAGNOSIS — I639 Cerebral infarction, unspecified: Secondary | ICD-10-CM | POA: Diagnosis not present

## 2016-01-30 DIAGNOSIS — G40501 Epileptic seizures related to external causes, not intractable, with status epilepticus: Secondary | ICD-10-CM | POA: Diagnosis not present

## 2016-01-30 DIAGNOSIS — M6281 Muscle weakness (generalized): Secondary | ICD-10-CM | POA: Diagnosis not present

## 2016-01-30 DIAGNOSIS — E162 Hypoglycemia, unspecified: Secondary | ICD-10-CM | POA: Diagnosis not present

## 2016-01-31 DIAGNOSIS — M6281 Muscle weakness (generalized): Secondary | ICD-10-CM | POA: Diagnosis not present

## 2016-01-31 DIAGNOSIS — E162 Hypoglycemia, unspecified: Secondary | ICD-10-CM | POA: Diagnosis not present

## 2016-01-31 DIAGNOSIS — E1122 Type 2 diabetes mellitus with diabetic chronic kidney disease: Secondary | ICD-10-CM | POA: Diagnosis not present

## 2016-01-31 DIAGNOSIS — G40501 Epileptic seizures related to external causes, not intractable, with status epilepticus: Secondary | ICD-10-CM | POA: Diagnosis not present

## 2016-01-31 DIAGNOSIS — I69859 Hemiplegia and hemiparesis following other cerebrovascular disease affecting unspecified side: Secondary | ICD-10-CM | POA: Diagnosis not present

## 2016-01-31 DIAGNOSIS — I639 Cerebral infarction, unspecified: Secondary | ICD-10-CM | POA: Diagnosis not present

## 2016-02-03 DIAGNOSIS — M6281 Muscle weakness (generalized): Secondary | ICD-10-CM | POA: Diagnosis not present

## 2016-02-03 DIAGNOSIS — E1122 Type 2 diabetes mellitus with diabetic chronic kidney disease: Secondary | ICD-10-CM | POA: Diagnosis not present

## 2016-02-03 DIAGNOSIS — G40501 Epileptic seizures related to external causes, not intractable, with status epilepticus: Secondary | ICD-10-CM | POA: Diagnosis not present

## 2016-02-03 DIAGNOSIS — E162 Hypoglycemia, unspecified: Secondary | ICD-10-CM | POA: Diagnosis not present

## 2016-02-03 DIAGNOSIS — I639 Cerebral infarction, unspecified: Secondary | ICD-10-CM | POA: Diagnosis not present

## 2016-02-03 DIAGNOSIS — I69859 Hemiplegia and hemiparesis following other cerebrovascular disease affecting unspecified side: Secondary | ICD-10-CM | POA: Diagnosis not present

## 2016-02-04 DIAGNOSIS — I639 Cerebral infarction, unspecified: Secondary | ICD-10-CM | POA: Diagnosis not present

## 2016-02-04 DIAGNOSIS — I739 Peripheral vascular disease, unspecified: Secondary | ICD-10-CM | POA: Diagnosis not present

## 2016-02-04 DIAGNOSIS — E1122 Type 2 diabetes mellitus with diabetic chronic kidney disease: Secondary | ICD-10-CM | POA: Diagnosis not present

## 2016-02-04 DIAGNOSIS — I70203 Unspecified atherosclerosis of native arteries of extremities, bilateral legs: Secondary | ICD-10-CM | POA: Diagnosis not present

## 2016-02-04 DIAGNOSIS — E162 Hypoglycemia, unspecified: Secondary | ICD-10-CM | POA: Diagnosis not present

## 2016-02-04 DIAGNOSIS — E119 Type 2 diabetes mellitus without complications: Secondary | ICD-10-CM | POA: Diagnosis not present

## 2016-02-04 DIAGNOSIS — M6281 Muscle weakness (generalized): Secondary | ICD-10-CM | POA: Diagnosis not present

## 2016-02-04 DIAGNOSIS — G40501 Epileptic seizures related to external causes, not intractable, with status epilepticus: Secondary | ICD-10-CM | POA: Diagnosis not present

## 2016-02-04 DIAGNOSIS — B351 Tinea unguium: Secondary | ICD-10-CM | POA: Diagnosis not present

## 2016-02-04 DIAGNOSIS — I69859 Hemiplegia and hemiparesis following other cerebrovascular disease affecting unspecified side: Secondary | ICD-10-CM | POA: Diagnosis not present

## 2016-02-05 DIAGNOSIS — G40501 Epileptic seizures related to external causes, not intractable, with status epilepticus: Secondary | ICD-10-CM | POA: Diagnosis not present

## 2016-02-05 DIAGNOSIS — E1122 Type 2 diabetes mellitus with diabetic chronic kidney disease: Secondary | ICD-10-CM | POA: Diagnosis not present

## 2016-02-05 DIAGNOSIS — I69859 Hemiplegia and hemiparesis following other cerebrovascular disease affecting unspecified side: Secondary | ICD-10-CM | POA: Diagnosis not present

## 2016-02-05 DIAGNOSIS — M6281 Muscle weakness (generalized): Secondary | ICD-10-CM | POA: Diagnosis not present

## 2016-02-05 DIAGNOSIS — E162 Hypoglycemia, unspecified: Secondary | ICD-10-CM | POA: Diagnosis not present

## 2016-02-05 DIAGNOSIS — I639 Cerebral infarction, unspecified: Secondary | ICD-10-CM | POA: Diagnosis not present

## 2016-02-06 DIAGNOSIS — I639 Cerebral infarction, unspecified: Secondary | ICD-10-CM | POA: Diagnosis not present

## 2016-02-06 DIAGNOSIS — I69859 Hemiplegia and hemiparesis following other cerebrovascular disease affecting unspecified side: Secondary | ICD-10-CM | POA: Diagnosis not present

## 2016-02-06 DIAGNOSIS — E1122 Type 2 diabetes mellitus with diabetic chronic kidney disease: Secondary | ICD-10-CM | POA: Diagnosis not present

## 2016-02-06 DIAGNOSIS — E162 Hypoglycemia, unspecified: Secondary | ICD-10-CM | POA: Diagnosis not present

## 2016-02-06 DIAGNOSIS — G40501 Epileptic seizures related to external causes, not intractable, with status epilepticus: Secondary | ICD-10-CM | POA: Diagnosis not present

## 2016-02-06 DIAGNOSIS — M6281 Muscle weakness (generalized): Secondary | ICD-10-CM | POA: Diagnosis not present

## 2016-02-07 DIAGNOSIS — I639 Cerebral infarction, unspecified: Secondary | ICD-10-CM | POA: Diagnosis not present

## 2016-02-07 DIAGNOSIS — G40501 Epileptic seizures related to external causes, not intractable, with status epilepticus: Secondary | ICD-10-CM | POA: Diagnosis not present

## 2016-02-07 DIAGNOSIS — M6281 Muscle weakness (generalized): Secondary | ICD-10-CM | POA: Diagnosis not present

## 2016-02-07 DIAGNOSIS — E162 Hypoglycemia, unspecified: Secondary | ICD-10-CM | POA: Diagnosis not present

## 2016-02-07 DIAGNOSIS — E1122 Type 2 diabetes mellitus with diabetic chronic kidney disease: Secondary | ICD-10-CM | POA: Diagnosis not present

## 2016-02-07 DIAGNOSIS — I69859 Hemiplegia and hemiparesis following other cerebrovascular disease affecting unspecified side: Secondary | ICD-10-CM | POA: Diagnosis not present

## 2016-02-20 ENCOUNTER — Non-Acute Institutional Stay (SKILLED_NURSING_FACILITY): Payer: Medicare Other | Admitting: Adult Health

## 2016-02-20 DIAGNOSIS — C50912 Malignant neoplasm of unspecified site of left female breast: Secondary | ICD-10-CM

## 2016-02-20 DIAGNOSIS — N183 Chronic kidney disease, stage 3 unspecified: Secondary | ICD-10-CM

## 2016-02-20 DIAGNOSIS — F0150 Vascular dementia without behavioral disturbance: Secondary | ICD-10-CM | POA: Diagnosis not present

## 2016-02-20 DIAGNOSIS — E034 Atrophy of thyroid (acquired): Secondary | ICD-10-CM

## 2016-02-20 DIAGNOSIS — I6330 Cerebral infarction due to thrombosis of unspecified cerebral artery: Secondary | ICD-10-CM

## 2016-02-20 DIAGNOSIS — I10 Essential (primary) hypertension: Secondary | ICD-10-CM

## 2016-02-20 DIAGNOSIS — I69354 Hemiplegia and hemiparesis following cerebral infarction affecting left non-dominant side: Secondary | ICD-10-CM

## 2016-02-20 DIAGNOSIS — E1122 Type 2 diabetes mellitus with diabetic chronic kidney disease: Secondary | ICD-10-CM | POA: Diagnosis not present

## 2016-02-20 DIAGNOSIS — R1314 Dysphagia, pharyngoesophageal phase: Secondary | ICD-10-CM | POA: Diagnosis not present

## 2016-02-20 NOTE — Progress Notes (Signed)
Location:   Psychiatrist of Service:   snf   CODE STATUS: full code   Allergies  Allergen Reactions  . Aspirin Hives  . Penicillins Hives    Chief Complaint  Patient presents with  . Medical Management of Chronic Issues     HPI:  She is a long term resident of this facility being seen for the management of her chronic illnesses.  Overall her status is stable. She tells me that she is feeling good. She does get out of bed daily. There are no nursing concerns at this time.   Past Medical History:  Diagnosis Date  . Anemia   . Arthritis   . Chronic kidney disease   . Diabetes mellitus without complication ([place])   . Hemorrhoid   . Hypertension   . Stroke ([place])   . Thyroid disease     Past Surgical History:  Procedure Laterality Date  . ABDOMINAL HYSTERECTOMY     PARTIAL  . BREAST LUMPECTOMY     LEFT  . MASTECTOMY  05/2008   Left    Social History   Social History  . Marital status: Single    Spouse name: N/A  . Number of children: N/A  . Years of education: N/A   Occupational History  . Not on file.   Social History Main Topics  . Smoking status: Never Smoker  . Smokeless tobacco: Not on file  . Alcohol use No  . Drug use: No  . Sexual activity: Not on file   Other Topics Concern  . Not on file   Social History Narrative  . No narrative on file   No family history on file.    VITAL SIGNS BP 134/75   Pulse (!) 58   Temp 98.2 F (36.8 C)   Resp 18   Ht 5\' 1"  (1.549 m)   Wt 165 lb (74.8 kg)   SpO2 95%   BMI 31.18 kg/m   Patient's Medications  New Prescriptions   No medications on file  Previous Medications   ASCORBIC ACID (VITAMIN C) 1000 MG TABLET    Take 1,000 mg by mouth daily.   CHOLECALCIFEROL (VITAMIN D) 2000 UNITS TABLET    Take 2,000 Units by mouth daily.   CLOPIDOGREL (PLAVIX) 75 MG TABLET    Take 1 tablet (75 mg total) by mouth daily.   DONEPEZIL (ARICEPT) 5 MG TABLET    Take 5 mg by mouth at bedtime.    FERROUS SULFATE 325 (65 FE) MG TABLET    Take 325 mg by mouth 2 (two) times daily with a meal.   HYDROCODONE-ACETAMINOPHEN (NORCO) 10-325 MG PER TABLET    Take 1 tablet by mouth 2 (two) times daily.   LEVOTHYROXINE (SYNTHROID, LEVOTHROID) 137 MCG TABLET    Take 137 mcg by mouth daily before breakfast.   LISINOPRIL (PRINIVIL,ZESTRIL) 40 MG TABLET    Take 40 mg by mouth daily.   LORAZEPAM (ATIVAN) 0.5 MG TABLET    Give one tablet 1 time daily, give 1 tablet by mouth as needed two times daily   METOPROLOL TARTRATE (LOPRESSOR) 25 MG TABLET    Take 12.5 mg by mouth 2 (two) times daily.   NUTRITIONAL SUPPLEMENTS (NUTRITIONAL SUPPLEMENT PO)    Take 120 mLs by mouth 4 (four) times daily. Puree texture, regular consistency   PAROXETINE (PAXIL) 40 MG TABLET    Take 40 mg by mouth daily.   POLLEN EXTRACTS (PROSTAT PO)  Take by mouth. 30 cc by mouth BID for albumin 2.7   POTASSIUM CHLORIDE (K-DUR) 10 MEQ TABLET    Take 10 mEq by mouth daily.   ROPINIROLE (REQUIP) 3 MG TABLET    Take 3 mg by mouth at bedtime.    VITAMIN B-12 1000 MCG TABLET    Take 1 tablet (1,000 mcg total) by mouth daily.  Modified Medications   No medications on file  Discontinued Medications   No medications on file     SIGNIFICANT DIAGNOSTIC EXAMS    09-19-14: left knee x-ray: Severe knee osteoarthritis with lateral and posterior tibial translation.  09-19-14: bilateral hip and pelvis x-ray: 1. Mild degenerative changes within both hips. 2. No acute abnormality. 3. Degenerative changes in the lumbar spine. 4. Atherosclerosis.  03-25-15: chest x-ray: no acute cardiopulmonary disease; no pneumonia    LABS REVIEWED:   01-25-15: glucose 87; bun 19; creat 1.45; k+ 4.8; na++141; liver normal albumin 2.7  04-11-15: glucose 108; bun 22; creat 1.40; k+ 5.5; na++140  04-17-15; glucose 84; bun 21; creat 1.22; k+ 5.1; na++139  05-24-15: wbc 5.0; hgb 8.3; hct 27.1; mcv 79.2; plt 200; glucose 95; bun 35; creat 1.36; k+ 4.2; na++ 140 liver  normal albumin 2.9; chol 111; ldl 51; trig 75; hdl 45; tsh 4.38; hgb a1c 5.5; urine micro-albumin 23.6  08-31-15: wbc 9.7; hgb 11.1; hct 35.2; mcv 78.6; plt 275; glucose 210; bun 31; creat 1.57; k+ 4.6; na++ 142 09-04-15: urine culture: 1,000 colonies  e-coli  09-18-15: tsh 97.79; free T3: 1.5; free T4: 0.6  10-25-15:tsh 36.56: free T4: 0.8; free T3: 1.8  12-17-15: tsh 7.45; free T3: 2.4; free T4: 1.19     Review of Systems  Unable to perform ROS: Other      Physical Exam  Constitutional: No distress.  Eyes: Conjunctivae are normal.  Neck: Neck supple. No JVD present. No thyromegaly present.  Cardiovascular: Normal rate, regular rhythm and intact distal pulses.  S3 S4 split  Respiratory: Effort normal and breath sounds normal. No respiratory distress. She has no wheezes. Status post left mastectomy   GI: Soft. Bowel sounds are normal. She exhibits no distension. There is no tenderness.  Musculoskeletal: She exhibits no edema.  Left hemiparesis    Lymphadenopathy:    She has no cervical adenopathy.  Neurological: She is alert.  Skin: Skin is warm and dry. She is not diaphoretic.   Psychiatric: She has a normal mood and affect.     ASSESSMENT/ PLAN:  1. CVA: with left hemiparesis: is neurologically stable; will continue plavix 75 mg daily takes vicodin 10/325 mg twice daily for pain management and will monitor   2. Hypothyroidism: will continue synthroid 137 mcg daily and will monitor her free t4: 1.19; tsh 7.45  3. Anemia: is presently stable will continue iron twice daily; vit b12 1000 mcg daily  hgb 11.1 iron level is up to 22 from  13; will monitor  4. Diabetes:  hgb a1c 5.5   Will not make changes will monitor her status.   5. Hypertension: her blood pressure is elevated will continue  lopressor 12.5 mg twice daily  lisinopril  40 mg daily   6. Restless leg syndrome: will continue requip  3 mg nightly   7.  Depression with anxiety: will continue paxil 40 mg daily has  ativan 0.5 mg three times daily as needed will monitor   8. ckd stage III:  Her bun/creat is 35/1.57; will monitor   9. Hypokalemia: will  continue  k+ to 10 meq daily  K+ is 4.6  10. Left breast cancer T1N1 ER/PR +, HER 2 negative, 2 sentinel nodes involved:  post left mastectomy with 3 node axillary evaluation and 5 years of [facility] thru 06-2013. No known active breast cancer Will monitor   11. Dysphagia: no signs of aspiration present; will continue nectar thick liquids.    Will check cbc; cmp; lipids; hgb a1c    MD is aware of resident's narcotic use and is in agreement with current plan of care. We will attempt to wean resident as apropriate   Ok Edwards NP [facility]  Contact [phone] Monday through Friday 8am- 5pm  After hours call [phone]

## 2016-02-24 DIAGNOSIS — D509 Iron deficiency anemia, unspecified: Secondary | ICD-10-CM | POA: Diagnosis not present

## 2016-02-24 DIAGNOSIS — D539 Nutritional anemia, unspecified: Secondary | ICD-10-CM | POA: Diagnosis not present

## 2016-02-24 DIAGNOSIS — E785 Hyperlipidemia, unspecified: Secondary | ICD-10-CM | POA: Diagnosis not present

## 2016-02-24 DIAGNOSIS — E1140 Type 2 diabetes mellitus with diabetic neuropathy, unspecified: Secondary | ICD-10-CM | POA: Diagnosis not present

## 2016-02-24 DIAGNOSIS — D508 Other iron deficiency anemias: Secondary | ICD-10-CM | POA: Diagnosis not present

## 2016-02-24 DIAGNOSIS — E1143 Type 2 diabetes mellitus with diabetic autonomic (poly)neuropathy: Secondary | ICD-10-CM | POA: Diagnosis not present

## 2016-02-24 DIAGNOSIS — D500 Iron deficiency anemia secondary to blood loss (chronic): Secondary | ICD-10-CM | POA: Diagnosis not present

## 2016-02-24 LAB — BASIC METABOLIC PANEL
BUN: 18 mg/dL (ref 4–21)
CREATININE: 1 mg/dL (ref 0.5–1.1)
GLUCOSE: 82 mg/dL
Potassium: 4.7 mmol/L (ref 3.4–5.3)
Sodium: 142 mmol/L (ref 137–147)

## 2016-02-24 LAB — HEPATIC FUNCTION PANEL
ALK PHOS: 88 U/L (ref 25–125)
ALT: 10 U/L (ref 7–35)
AST: 13 U/L (ref 13–35)
Bilirubin, Total: 0.3 mg/dL

## 2016-02-24 LAB — LIPID PANEL
CHOLESTEROL: 118 mg/dL (ref 0–200)
HDL: 36 mg/dL (ref 35–70)
LDL Cholesterol: 54 mg/dL
Triglycerides: 141 mg/dL (ref 40–160)

## 2016-02-24 LAB — CBC AND DIFFERENTIAL
HCT: 33 % — AB (ref 36–46)
HEMOGLOBIN: 9.9 g/dL — AB (ref 12.0–16.0)
Neutrophils Absolute: 4 /uL
PLATELETS: 196 10*3/uL (ref 150–399)
WBC: 6.3 10*3/mL

## 2016-02-24 LAB — HEMOGLOBIN A1C: Hemoglobin A1C: 5.3

## 2016-03-10 DIAGNOSIS — L89620 Pressure ulcer of left heel, unstageable: Secondary | ICD-10-CM | POA: Diagnosis not present

## 2016-03-17 DIAGNOSIS — L89620 Pressure ulcer of left heel, unstageable: Secondary | ICD-10-CM | POA: Diagnosis not present

## 2016-03-23 ENCOUNTER — Non-Acute Institutional Stay (SKILLED_NURSING_FACILITY): Payer: Medicare Other | Admitting: Adult Health

## 2016-03-23 ENCOUNTER — Encounter: Payer: Self-pay | Admitting: Adult Health

## 2016-03-23 DIAGNOSIS — D631 Anemia in chronic kidney disease: Secondary | ICD-10-CM

## 2016-03-23 DIAGNOSIS — E1122 Type 2 diabetes mellitus with diabetic chronic kidney disease: Secondary | ICD-10-CM

## 2016-03-23 DIAGNOSIS — E034 Atrophy of thyroid (acquired): Secondary | ICD-10-CM | POA: Diagnosis not present

## 2016-03-23 DIAGNOSIS — I6330 Cerebral infarction due to thrombosis of unspecified cerebral artery: Secondary | ICD-10-CM | POA: Diagnosis not present

## 2016-03-23 DIAGNOSIS — F0150 Vascular dementia without behavioral disturbance: Secondary | ICD-10-CM

## 2016-03-23 DIAGNOSIS — E876 Hypokalemia: Secondary | ICD-10-CM

## 2016-03-23 DIAGNOSIS — I10 Essential (primary) hypertension: Secondary | ICD-10-CM

## 2016-03-23 DIAGNOSIS — R1314 Dysphagia, pharyngoesophageal phase: Secondary | ICD-10-CM

## 2016-03-23 DIAGNOSIS — I69354 Hemiplegia and hemiparesis following cerebral infarction affecting left non-dominant side: Secondary | ICD-10-CM | POA: Diagnosis not present

## 2016-03-23 DIAGNOSIS — N183 Chronic kidney disease, stage 3 unspecified: Secondary | ICD-10-CM

## 2016-03-23 DIAGNOSIS — C50912 Malignant neoplasm of unspecified site of left female breast: Secondary | ICD-10-CM | POA: Diagnosis not present

## 2016-03-23 NOTE — Progress Notes (Signed)
Location:   [place] Room Number: 117 A Place of Service:  SNF (31)   CODE STATUS: Full Code  Allergies  Allergen Reactions  . Aspirin Hives  . Penicillins Hives    Chief Complaint  Patient presents with  . Medical Management of Chronic Issues    Routine Visit    HPI:  She is a long term resident of this facility being seen for the management of her chronic illnesses. Overall there is little change in her status. Her diabetes is under control with a hgb a1c of 5.3. Her blood readings are elevated and will need her medication to be adjusted.  There are no other nursing concerns at this time. She does get out of bed daily.    Past Medical History:  Diagnosis Date  . Anemia   . Arthritis   . Chronic kidney disease   . Diabetes mellitus without complication ([place])   . Hemorrhoid   . Hypertension   . Stroke ([place])   . Thyroid disease     Past Surgical History:  Procedure Laterality Date  . ABDOMINAL HYSTERECTOMY     PARTIAL  . BREAST LUMPECTOMY     LEFT  . MASTECTOMY  05/2008   Left    Social History   Social History  . Marital status: Single    Spouse name: N/A  . Number of children: N/A  . Years of education: N/A   Occupational History  . Not on file.   Social History Main Topics  . Smoking status: Never Smoker  . Smokeless tobacco: Never Used  . Alcohol use No  . Drug use: No  . Sexual activity: Not on file   Other Topics Concern  . Not on file   Social History Narrative  . No narrative on file   No family history on file.    VITAL SIGNS BP (!) 168/80   Pulse 61   Temp 98.4 F (36.9 C)   Resp 16   Ht 5\' 1"  (1.549 m)   SpO2 95% Comment: room air  Patient's Medications  New Prescriptions   No medications on file  Previous Medications   AMLODIPINE (NORVASC) 5 MG TABLET    Take 5 mg by mouth daily.   ASCORBIC ACID (VITAMIN C) 1000 MG TABLET    Take 1,000 mg by mouth daily.   CHOLECALCIFEROL (VITAMIN D) 2000 UNITS TABLET     Take 2,000 Units by mouth daily.   CLOPIDOGREL (PLAVIX) 75 MG TABLET    Take 1 tablet (75 mg total) by mouth daily.   COLLAGENASE (SANTYL) OINTMENT    Apply 1 application topically daily. To left heel   DONEPEZIL (ARICEPT) 5 MG TABLET    Take 5 mg by mouth at bedtime.    FERROUS SULFATE 325 (65 FE) MG TABLET    Take 325 mg by mouth 2 (two) times daily with a meal.   HYDROCODONE-ACETAMINOPHEN (NORCO) 10-325 MG PER TABLET    Take 1 tablet by mouth 2 (two) times daily.   LEVOTHYROXINE (SYNTHROID, LEVOTHROID) 137 MCG TABLET    Take 137 mcg by mouth daily before breakfast.   LISINOPRIL (PRINIVIL,ZESTRIL) 40 MG TABLET    Take 40 mg by mouth daily.   LORAZEPAM (ATIVAN) 0.5 MG TABLET    Give one tablet 1 time daily, give 1 tablet by mouth as needed two times daily   METOPROLOL TARTRATE (LOPRESSOR) 25 MG TABLET    Take 12.5 mg by mouth 2 (two) times daily.  NUTRITIONAL SUPPLEMENTS (NUTRITIONAL SUPPLEMENT PO)    Take 120 mLs by mouth 4 (four) times daily. Puree texture, regular consistency   PAROXETINE (PAXIL) 40 MG TABLET    Take 40 mg by mouth daily.   POLLEN EXTRACTS (PROSTAT PO)    Take by mouth. 30 cc by mouth BID for albumin 2.7   POTASSIUM CHLORIDE (K-DUR) 10 MEQ TABLET    Take 10 mEq by mouth daily.   ROPINIROLE (REQUIP) 3 MG TABLET    Take 3 mg by mouth at bedtime.    VITAMIN B-12 1000 MCG TABLET    Take 1 tablet (1,000 mcg total) by mouth daily.   ZINC SULFATE 220 (50 ZN) MG CAPSULE    Take 220 mg by mouth daily.  Modified Medications   No medications on file  Discontinued Medications   No medications on file     SIGNIFICANT DIAGNOSTIC EXAMS  09-19-14: left knee x-ray: Severe knee osteoarthritis with lateral and posterior tibial translation.  09-19-14: bilateral hip and pelvis x-ray: 1. Mild degenerative changes within both hips. 2. No acute abnormality. 3. Degenerative changes in the lumbar spine. 4. Atherosclerosis.  03-25-15: chest x-ray: no acute cardiopulmonary disease; no  pneumonia    LABS REVIEWED:   04-11-15: glucose 108; bun 22; creat 1.40; k+ 5.5; na++140  04-17-15; glucose 84; bun 21; creat 1.22; k+ 5.1; na++139  05-24-15: wbc 5.0; hgb 8.3; hct 27.1; mcv 79.2; plt 200; glucose 95; bun 35; creat 1.36; k+ 4.2; na++ 140 liver normal albumin 2.9; chol 111; ldl 51; trig 75; hdl 45; tsh 4.38; hgb a1c 5.5; urine micro-albumin 23.6  08-31-15: wbc 9.7; hgb 11.1; hct 35.2; mcv 78.6; plt 275; glucose 210; bun 31; creat 1.57; k+ 4.6; na++ 142 09-04-15: urine culture: 1,000 colonies  e-coli  09-18-15: tsh 97.79; free T3: 1.5; free T4: 0.6  10-25-15:tsh 36.56: free T4: 0.8; free T3: 1.8  12-17-15: tsh 7.45; free T3: 2.4; free T4: 1.19  02-24-16: wbc 6.3; hgb 9.9; hct 33.4; mcv 83.3 plt 196; glucose 82; bun 18.0; creat 0.99; k+ 4.7 ;na++ 142; liver normal albumin 2.9 chol 118; ldl 54; trig 141; hdl 36; hgb a1c 5.3; iron 47; tibc 113; ferritin 1214.00    Review of Systems  Unable to perform ROS: Other      Physical Exam  Constitutional: No distress.  Eyes: Conjunctivae are normal.  Neck: Neck supple. No JVD present. No thyromegaly present.  Cardiovascular: Normal rate, regular rhythm and intact distal pulses.  Respiratory: Effort normal and breath sounds normal. No respiratory distress. She has no wheezes. Status post left mastectomy   GI: Soft. Bowel sounds are normal. She exhibits no distension. There is no tenderness.  Musculoskeletal: She exhibits no edema.  Left hemiparesis    Lymphadenopathy:    She has no cervical adenopathy.  Neurological: She is alert.  Skin: Skin is warm and dry. She is not diaphoretic.  Left heel: 3.3 x 2.8 cm 80% necrotic   Psychiatric: She has a normal mood and affect.     ASSESSMENT/ PLAN:  1. CVA: with left hemiparesis: is neurologically stable; will continue plavix 75 mg daily takes vicodin 10/325 mg twice daily for pain management and will monitor   2. Hypothyroidism: will continue synthroid 137 mcg daily and will monitor her  free t4: 1.19; tsh 7.45  3. Anemia: is presently stable will continue iron twice daily; vit b12 1000 mcg daily  hgb 9.9 iron level is up to 47 from  13; will monitor  4. Diabetes:  hgb a1c 5.3; is presently not on medications;  Is on ace and ldl is 54; will not make changes will monitor her status.   5. Hypertension:  will continue  lopressor 12.5 mg twice daily  lisinopril  40 mg daily will increase her norvasc to 10 mg daily   6. Restless leg syndrome: will continue requip  3 mg nightly   7.  Depression with anxiety: will continue paxil 40 mg daily has ativan 0.5 mg three times daily as needed will monitor   8. ckd stage III:  Her bun/creat is 18/0.99; will monitor   9. Hypokalemia:  K+ is 4.7 will stop her k+ supplement at this time and will recheck in one week   10. Left breast cancer T1N1 ER/PR +, HER 2 negative, 2 sentinel nodes involved:  post left mastectomy with 3 node axillary evaluation and 5 years of [facility] thru 06-2013. No known active breast cancer Will monitor   11. Dysphagia: no signs of aspiration present; will continue nectar thick liquids.   12. Dementia: will continue aricept 5 mg nightly  Her weight is 165 pounds.   13. Protein calorie malnutrition: her albumin is 2.9 will continue supplement per facility protocol      MD is aware of resident's narcotic use and is in agreement with current plan of care. We will attempt to wean resident as apropriate     Ok Edwards NP [facility]  Contact [phone] Monday through Friday 8am- 5pm  After hours call [phone]

## 2016-03-24 DIAGNOSIS — L89620 Pressure ulcer of left heel, unstageable: Secondary | ICD-10-CM | POA: Diagnosis not present

## 2016-03-30 DIAGNOSIS — E8770 Fluid overload, unspecified: Secondary | ICD-10-CM | POA: Diagnosis not present

## 2016-03-31 DIAGNOSIS — L89620 Pressure ulcer of left heel, unstageable: Secondary | ICD-10-CM | POA: Diagnosis not present

## 2016-04-07 DIAGNOSIS — Z961 Presence of intraocular lens: Secondary | ICD-10-CM | POA: Diagnosis not present

## 2016-04-07 DIAGNOSIS — L89620 Pressure ulcer of left heel, unstageable: Secondary | ICD-10-CM | POA: Diagnosis not present

## 2016-04-07 DIAGNOSIS — E113553 Type 2 diabetes mellitus with stable proliferative diabetic retinopathy, bilateral: Secondary | ICD-10-CM | POA: Diagnosis not present

## 2016-04-07 DIAGNOSIS — Z794 Long term (current) use of insulin: Secondary | ICD-10-CM | POA: Diagnosis not present

## 2016-04-09 DIAGNOSIS — F0281 Dementia in other diseases classified elsewhere with behavioral disturbance: Secondary | ICD-10-CM | POA: Diagnosis not present

## 2016-04-09 DIAGNOSIS — M6281 Muscle weakness (generalized): Secondary | ICD-10-CM | POA: Diagnosis not present

## 2016-04-09 DIAGNOSIS — R41841 Cognitive communication deficit: Secondary | ICD-10-CM | POA: Diagnosis not present

## 2016-04-09 DIAGNOSIS — I639 Cerebral infarction, unspecified: Secondary | ICD-10-CM | POA: Diagnosis not present

## 2016-04-09 DIAGNOSIS — G2581 Restless legs syndrome: Secondary | ICD-10-CM | POA: Diagnosis not present

## 2016-04-09 DIAGNOSIS — I69859 Hemiplegia and hemiparesis following other cerebrovascular disease affecting unspecified side: Secondary | ICD-10-CM | POA: Diagnosis not present

## 2016-04-10 DIAGNOSIS — I69859 Hemiplegia and hemiparesis following other cerebrovascular disease affecting unspecified side: Secondary | ICD-10-CM | POA: Diagnosis not present

## 2016-04-10 DIAGNOSIS — F0281 Dementia in other diseases classified elsewhere with behavioral disturbance: Secondary | ICD-10-CM | POA: Diagnosis not present

## 2016-04-10 DIAGNOSIS — M6281 Muscle weakness (generalized): Secondary | ICD-10-CM | POA: Diagnosis not present

## 2016-04-10 DIAGNOSIS — R41841 Cognitive communication deficit: Secondary | ICD-10-CM | POA: Diagnosis not present

## 2016-04-10 DIAGNOSIS — I639 Cerebral infarction, unspecified: Secondary | ICD-10-CM | POA: Diagnosis not present

## 2016-04-10 DIAGNOSIS — G2581 Restless legs syndrome: Secondary | ICD-10-CM | POA: Diagnosis not present

## 2016-04-13 DIAGNOSIS — I69859 Hemiplegia and hemiparesis following other cerebrovascular disease affecting unspecified side: Secondary | ICD-10-CM | POA: Diagnosis not present

## 2016-04-13 DIAGNOSIS — F0281 Dementia in other diseases classified elsewhere with behavioral disturbance: Secondary | ICD-10-CM | POA: Diagnosis not present

## 2016-04-13 DIAGNOSIS — R41841 Cognitive communication deficit: Secondary | ICD-10-CM | POA: Diagnosis not present

## 2016-04-13 DIAGNOSIS — M6281 Muscle weakness (generalized): Secondary | ICD-10-CM | POA: Diagnosis not present

## 2016-04-13 DIAGNOSIS — I639 Cerebral infarction, unspecified: Secondary | ICD-10-CM | POA: Diagnosis not present

## 2016-04-13 DIAGNOSIS — G2581 Restless legs syndrome: Secondary | ICD-10-CM | POA: Diagnosis not present

## 2016-04-14 DIAGNOSIS — I639 Cerebral infarction, unspecified: Secondary | ICD-10-CM | POA: Diagnosis not present

## 2016-04-14 DIAGNOSIS — G2581 Restless legs syndrome: Secondary | ICD-10-CM | POA: Diagnosis not present

## 2016-04-14 DIAGNOSIS — R41841 Cognitive communication deficit: Secondary | ICD-10-CM | POA: Diagnosis not present

## 2016-04-14 DIAGNOSIS — L89620 Pressure ulcer of left heel, unstageable: Secondary | ICD-10-CM | POA: Diagnosis not present

## 2016-04-14 DIAGNOSIS — I69859 Hemiplegia and hemiparesis following other cerebrovascular disease affecting unspecified side: Secondary | ICD-10-CM | POA: Diagnosis not present

## 2016-04-14 DIAGNOSIS — M6281 Muscle weakness (generalized): Secondary | ICD-10-CM | POA: Diagnosis not present

## 2016-04-14 DIAGNOSIS — F0281 Dementia in other diseases classified elsewhere with behavioral disturbance: Secondary | ICD-10-CM | POA: Diagnosis not present

## 2016-04-15 DIAGNOSIS — G2581 Restless legs syndrome: Secondary | ICD-10-CM | POA: Diagnosis not present

## 2016-04-15 DIAGNOSIS — R41841 Cognitive communication deficit: Secondary | ICD-10-CM | POA: Diagnosis not present

## 2016-04-15 DIAGNOSIS — F0281 Dementia in other diseases classified elsewhere with behavioral disturbance: Secondary | ICD-10-CM | POA: Diagnosis not present

## 2016-04-15 DIAGNOSIS — M6281 Muscle weakness (generalized): Secondary | ICD-10-CM | POA: Diagnosis not present

## 2016-04-15 DIAGNOSIS — I69859 Hemiplegia and hemiparesis following other cerebrovascular disease affecting unspecified side: Secondary | ICD-10-CM | POA: Diagnosis not present

## 2016-04-15 DIAGNOSIS — I639 Cerebral infarction, unspecified: Secondary | ICD-10-CM | POA: Diagnosis not present

## 2016-04-16 DIAGNOSIS — M6281 Muscle weakness (generalized): Secondary | ICD-10-CM | POA: Diagnosis not present

## 2016-04-16 DIAGNOSIS — F0281 Dementia in other diseases classified elsewhere with behavioral disturbance: Secondary | ICD-10-CM | POA: Diagnosis not present

## 2016-04-16 DIAGNOSIS — I69859 Hemiplegia and hemiparesis following other cerebrovascular disease affecting unspecified side: Secondary | ICD-10-CM | POA: Diagnosis not present

## 2016-04-16 DIAGNOSIS — R41841 Cognitive communication deficit: Secondary | ICD-10-CM | POA: Diagnosis not present

## 2016-04-16 DIAGNOSIS — G2581 Restless legs syndrome: Secondary | ICD-10-CM | POA: Diagnosis not present

## 2016-04-16 DIAGNOSIS — I639 Cerebral infarction, unspecified: Secondary | ICD-10-CM | POA: Diagnosis not present

## 2016-04-17 DIAGNOSIS — I771 Stricture of artery: Secondary | ICD-10-CM | POA: Diagnosis not present

## 2016-04-17 DIAGNOSIS — I69859 Hemiplegia and hemiparesis following other cerebrovascular disease affecting unspecified side: Secondary | ICD-10-CM | POA: Diagnosis not present

## 2016-04-17 DIAGNOSIS — I639 Cerebral infarction, unspecified: Secondary | ICD-10-CM | POA: Diagnosis not present

## 2016-04-17 DIAGNOSIS — G2581 Restless legs syndrome: Secondary | ICD-10-CM | POA: Diagnosis not present

## 2016-04-17 DIAGNOSIS — R41841 Cognitive communication deficit: Secondary | ICD-10-CM | POA: Diagnosis not present

## 2016-04-17 DIAGNOSIS — M6281 Muscle weakness (generalized): Secondary | ICD-10-CM | POA: Diagnosis not present

## 2016-04-17 DIAGNOSIS — F0281 Dementia in other diseases classified elsewhere with behavioral disturbance: Secondary | ICD-10-CM | POA: Diagnosis not present

## 2016-04-19 DIAGNOSIS — I69859 Hemiplegia and hemiparesis following other cerebrovascular disease affecting unspecified side: Secondary | ICD-10-CM | POA: Diagnosis not present

## 2016-04-19 DIAGNOSIS — R41841 Cognitive communication deficit: Secondary | ICD-10-CM | POA: Diagnosis not present

## 2016-04-19 DIAGNOSIS — F0281 Dementia in other diseases classified elsewhere with behavioral disturbance: Secondary | ICD-10-CM | POA: Diagnosis not present

## 2016-04-19 DIAGNOSIS — M6281 Muscle weakness (generalized): Secondary | ICD-10-CM | POA: Diagnosis not present

## 2016-04-19 DIAGNOSIS — G2581 Restless legs syndrome: Secondary | ICD-10-CM | POA: Diagnosis not present

## 2016-04-19 DIAGNOSIS — I639 Cerebral infarction, unspecified: Secondary | ICD-10-CM | POA: Diagnosis not present

## 2016-04-20 ENCOUNTER — Encounter: Payer: Self-pay | Admitting: Adult Health

## 2016-04-20 ENCOUNTER — Non-Acute Institutional Stay (SKILLED_NURSING_FACILITY): Payer: Medicare Other | Admitting: Adult Health

## 2016-04-20 DIAGNOSIS — I10 Essential (primary) hypertension: Secondary | ICD-10-CM | POA: Diagnosis not present

## 2016-04-20 DIAGNOSIS — D631 Anemia in chronic kidney disease: Secondary | ICD-10-CM

## 2016-04-20 DIAGNOSIS — E034 Atrophy of thyroid (acquired): Secondary | ICD-10-CM | POA: Diagnosis not present

## 2016-04-20 DIAGNOSIS — F0150 Vascular dementia without behavioral disturbance: Secondary | ICD-10-CM

## 2016-04-20 DIAGNOSIS — R1314 Dysphagia, pharyngoesophageal phase: Secondary | ICD-10-CM | POA: Diagnosis not present

## 2016-04-20 DIAGNOSIS — I69354 Hemiplegia and hemiparesis following cerebral infarction affecting left non-dominant side: Secondary | ICD-10-CM

## 2016-04-20 DIAGNOSIS — G2581 Restless legs syndrome: Secondary | ICD-10-CM | POA: Diagnosis not present

## 2016-04-20 DIAGNOSIS — I6330 Cerebral infarction due to thrombosis of unspecified cerebral artery: Secondary | ICD-10-CM

## 2016-04-20 DIAGNOSIS — F0281 Dementia in other diseases classified elsewhere with behavioral disturbance: Secondary | ICD-10-CM | POA: Diagnosis not present

## 2016-04-20 DIAGNOSIS — R41841 Cognitive communication deficit: Secondary | ICD-10-CM | POA: Diagnosis not present

## 2016-04-20 DIAGNOSIS — E1122 Type 2 diabetes mellitus with diabetic chronic kidney disease: Secondary | ICD-10-CM | POA: Diagnosis not present

## 2016-04-20 DIAGNOSIS — L89620 Pressure ulcer of left heel, unstageable: Secondary | ICD-10-CM

## 2016-04-20 DIAGNOSIS — N183 Chronic kidney disease, stage 3 unspecified: Secondary | ICD-10-CM

## 2016-04-20 DIAGNOSIS — I69859 Hemiplegia and hemiparesis following other cerebrovascular disease affecting unspecified side: Secondary | ICD-10-CM | POA: Diagnosis not present

## 2016-04-20 DIAGNOSIS — M6281 Muscle weakness (generalized): Secondary | ICD-10-CM | POA: Diagnosis not present

## 2016-04-20 DIAGNOSIS — I639 Cerebral infarction, unspecified: Secondary | ICD-10-CM | POA: Diagnosis not present

## 2016-04-20 NOTE — Progress Notes (Signed)
Location:   [place] Room Number: 112 B Place of Service:  SNF (31)   CODE STATUS: Full Code  Allergies  Allergen Reactions  . Aspirin Hives  . Penicillins Hives    Chief Complaint  Patient presents with  . Medical Management of Chronic Issues    1 month follow up    HPI:  She is a long term resident of this facility being seen for the management of of her chronic illnesses. Overall there is little change in her status. She tells me that she is feeling good. She does get out of bed daily. There are no nursing concerns at this time.   Past Medical History:  Diagnosis Date  . Anemia   . Arthritis   . Chronic kidney disease   . Diabetes mellitus without complication ([place])   . Hemorrhoid   . Hypertension   . Stroke ([place])   . Thyroid disease     Past Surgical History:  Procedure Laterality Date  . ABDOMINAL HYSTERECTOMY     PARTIAL  . BREAST LUMPECTOMY     LEFT  . MASTECTOMY  05/2008   Left    Social History   Social History  . Marital status: Single    Spouse name: N/A  . Number of children: N/A  . Years of education: N/A   Occupational History  . Not on file.   Social History Main Topics  . Smoking status: Never Smoker  . Smokeless tobacco: Never Used  . Alcohol use No  . Drug use: No  . Sexual activity: Not on file   Other Topics Concern  . Not on file   Social History Narrative  . No narrative on file   History reviewed. No pertinent family history.    VITAL SIGNS BP 124/60   Pulse 61   Temp 98.4 F (36.9 C)   Resp 16   Ht 5\' 1"  (1.549 m)   SpO2 95%   Patient's Medications  New Prescriptions   No medications on file  Previous Medications   AMINO ACIDS-PROTEIN HYDROLYS (FEEDING SUPPLEMENT, PRO-STAT SUGAR FREE 64,) LIQD    Take 30 mLs by mouth 3 (three) times daily with meals.   AMLODIPINE (NORVASC) 10 MG TABLET    Take 10 mg by mouth daily.   ASCORBIC ACID (VITAMIN C) 1000 MG TABLET    Take 1,000 mg by mouth daily.    CHOLECALCIFEROL (VITAMIN D) 2000 UNITS TABLET    Take 2,000 Units by mouth daily.   CLOPIDOGREL (PLAVIX) 75 MG TABLET    Take 1 tablet (75 mg total) by mouth daily.   COLLAGENASE (SANTYL) OINTMENT    Apply 1 application topically daily. To left heel   DONEPEZIL (ARICEPT) 5 MG TABLET    Take 5 mg by mouth at bedtime.    FERROUS SULFATE 325 (65 FE) MG TABLET    Take 325 mg by mouth 2 (two) times daily with a meal.   HYDROCODONE-ACETAMINOPHEN (NORCO) 10-325 MG PER TABLET    Take 1 tablet by mouth 2 (two) times daily.   LEVOTHYROXINE (SYNTHROID, LEVOTHROID) 137 MCG TABLET    Take 137 mcg by mouth daily before breakfast.   LISINOPRIL (PRINIVIL,ZESTRIL) 40 MG TABLET    Take 40 mg by mouth daily.   LORAZEPAM (ATIVAN) 0.5 MG TABLET    Give one tablet 1 time daily, give 1 tablet by mouth as needed two times daily   METOPROLOL TARTRATE (LOPRESSOR) 25 MG TABLET    Take  12.5 mg by mouth 2 (two) times daily.   NUTRITIONAL SUPPLEMENTS (NUTRITIONAL SUPPLEMENT PO)    Take 120 mLs by mouth 4 (four) times daily. Puree texture, regular consistency   PAROXETINE (PAXIL) 40 MG TABLET    Take 40 mg by mouth daily.   ROPINIROLE (REQUIP) 3 MG TABLET    Take 3 mg by mouth at bedtime.    UNABLE TO FIND    House Supplement - Magic Cup every day with lunch and dinner   VITAMIN B-12 1000 MCG TABLET    Take 1 tablet (1,000 mcg total) by mouth daily.   ZINC SULFATE 220 (50 ZN) MG CAPSULE    Take 220 mg by mouth daily.  Modified Medications   No medications on file  Discontinued Medications   POLLEN EXTRACTS (PROSTAT PO)    Take by mouth. 30 cc by mouth BID for albumin 2.7   POTASSIUM CHLORIDE (K-DUR) 10 MEQ TABLET    Take 10 mEq by mouth daily.     SIGNIFICANT DIAGNOSTIC EXAMS  03-25-15: chest x-ray: no acute cardiopulmonary disease; no pneumonia   04-17-16: ABI: Right ABI: 0.86; Left ABI: compatible with significant arterial wall calcification.    LABS REVIEWED:   05-24-15: wbc 5.0; hgb 8.3; hct 27.1; mcv 79.2; plt  200; glucose 95; bun 35; creat 1.36; k+ 4.2; na++ 140 liver normal albumin 2.9; chol 111; ldl 51; trig 75; hdl 45; tsh 4.38; hgb a1c 5.5; urine micro-albumin 23.6  08-31-15: wbc 9.7; hgb 11.1; hct 35.2; mcv 78.6; plt 275; glucose 210; bun 31; creat 1.57; k+ 4.6; na++ 142 09-04-15: urine culture: 1,000 colonies  e-coli  09-18-15: tsh 97.79; free T3: 1.5; free T4: 0.6  10-25-15:tsh 36.56: free T4: 0.8; free T3: 1.8  12-17-15: tsh 7.45; free T3: 2.4; free T4: 1.19  02-24-16: wbc 6.3; hgb 9.9; hct 33.4; mcv 83.3 plt 196; glucose 82; bun 18.0; creat 0.99; k+ 4.7 ;na++ 142; liver normal albumin 2.9 chol 118; ldl 54; trig 141; hdl 36; hgb a1c 5.3; iron 47; tibc 113; ferritin 1214.00    Review of Systems  Unable to perform ROS: Other      Physical Exam  Constitutional: No distress.  Eyes: Conjunctivae are normal.  Neck: Neck supple. No JVD present. No thyromegaly present.  Cardiovascular: Normal rate, regular rhythm and intact distal pulses. Pedal pulses faint  Respiratory: Effort normal and breath sounds normal. No respiratory distress. She has no wheezes. Status post left mastectomy   GI: Soft. Bowel sounds are normal. She exhibits no distension. There is no tenderness.  Musculoskeletal: She exhibits no edema.  Left hemiparesis    Lymphadenopathy:    She has no cervical adenopathy.  Neurological: She is alert.  Skin: Skin is warm and dry. She is not diaphoretic.  Left heel: 2.7 x 2.3 x 0.2 cm 80% necrosis present  Psychiatric: She has a normal mood and affect.     ASSESSMENT/ PLAN:  1. CVA: with left hemiparesis: is neurologically stable; will continue plavix 75 mg daily takes vicodin 10/325 mg twice daily for pain management and will monitor   2. Hypothyroidism: will continue synthroid 137 mcg daily and will monitor her free t4: 1.19; tsh 7.45  3. Anemia: is presently stable will continue iron twice daily; vit b12 1000 mcg daily  hgb 9.9 iron level is up to 47 from  13; will  monitor  4. Diabetes:  hgb a1c 5.3; is presently not on medications;  Is on ace and ldl is 54; will  not make changes will monitor her status.   5. Hypertension:  will continue  lopressor 12.5 mg twice daily  lisinopril  40 mg daily  norvasc  10 mg daily   6. Restless leg syndrome: will continue requip  3 mg nightly   7.  Depression with anxiety: will continue paxil 40 mg daily has ativan 0.5 mg daily and twice daily as needed. She requires the prn dosing to manage her acute episodes of anxiety and has been on this medication long term.   8. ckd stage III:  Her bun/creat is 18/0.99; will monitor   9. Hypokalemia:  K+ is 4.7 is not on supplement at this time will monitor   10. Left breast cancer T1N1 ER/PR +, HER 2 negative, 2 sentinel nodes involved:  post left mastectomy with 3 node axillary evaluation and 5 years of [facility] thru 06-2013. No known active breast cancer Will monitor   11. Dysphagia: no signs of aspiration present; will continue nectar thick liquids.   12. Dementia: will continue aricept 5 mg nightly  Her weight is 165 pounds.   13. Protein calorie malnutrition: her albumin is 2.9 will continue supplement per facility protocol   14. unstaged left heel ulceration: will continue current treatment plan. Is followed by wound doctor; will setup vein and vascular consult. Will stop zinc; will continue supplements; will monitor   Will check bmp; free t3; free t4; tsh    MD is aware of resident's narcotic use and is in agreement with current plan of care. We will attempt to wean resident as apropriate     Ok Edwards NP [facility]  Contact [phone] Monday through Friday 8am- 5pm  After hours call [phone]

## 2016-04-21 DIAGNOSIS — F0281 Dementia in other diseases classified elsewhere with behavioral disturbance: Secondary | ICD-10-CM | POA: Diagnosis not present

## 2016-04-21 DIAGNOSIS — M6281 Muscle weakness (generalized): Secondary | ICD-10-CM | POA: Diagnosis not present

## 2016-04-21 DIAGNOSIS — G2581 Restless legs syndrome: Secondary | ICD-10-CM | POA: Diagnosis not present

## 2016-04-21 DIAGNOSIS — I69859 Hemiplegia and hemiparesis following other cerebrovascular disease affecting unspecified side: Secondary | ICD-10-CM | POA: Diagnosis not present

## 2016-04-21 DIAGNOSIS — R41841 Cognitive communication deficit: Secondary | ICD-10-CM | POA: Diagnosis not present

## 2016-04-21 DIAGNOSIS — L89620 Pressure ulcer of left heel, unstageable: Secondary | ICD-10-CM | POA: Diagnosis not present

## 2016-04-21 DIAGNOSIS — I639 Cerebral infarction, unspecified: Secondary | ICD-10-CM | POA: Diagnosis not present

## 2016-04-22 DIAGNOSIS — I69859 Hemiplegia and hemiparesis following other cerebrovascular disease affecting unspecified side: Secondary | ICD-10-CM | POA: Diagnosis not present

## 2016-04-22 DIAGNOSIS — I639 Cerebral infarction, unspecified: Secondary | ICD-10-CM | POA: Diagnosis not present

## 2016-04-22 DIAGNOSIS — M6281 Muscle weakness (generalized): Secondary | ICD-10-CM | POA: Diagnosis not present

## 2016-04-22 DIAGNOSIS — R41841 Cognitive communication deficit: Secondary | ICD-10-CM | POA: Diagnosis not present

## 2016-04-22 DIAGNOSIS — G2581 Restless legs syndrome: Secondary | ICD-10-CM | POA: Diagnosis not present

## 2016-04-22 DIAGNOSIS — F0281 Dementia in other diseases classified elsewhere with behavioral disturbance: Secondary | ICD-10-CM | POA: Diagnosis not present

## 2016-04-23 DIAGNOSIS — G2581 Restless legs syndrome: Secondary | ICD-10-CM | POA: Diagnosis not present

## 2016-04-23 DIAGNOSIS — I639 Cerebral infarction, unspecified: Secondary | ICD-10-CM | POA: Diagnosis not present

## 2016-04-23 DIAGNOSIS — M6281 Muscle weakness (generalized): Secondary | ICD-10-CM | POA: Diagnosis not present

## 2016-04-23 DIAGNOSIS — R41841 Cognitive communication deficit: Secondary | ICD-10-CM | POA: Diagnosis not present

## 2016-04-23 DIAGNOSIS — F0281 Dementia in other diseases classified elsewhere with behavioral disturbance: Secondary | ICD-10-CM | POA: Diagnosis not present

## 2016-04-23 DIAGNOSIS — I69859 Hemiplegia and hemiparesis following other cerebrovascular disease affecting unspecified side: Secondary | ICD-10-CM | POA: Diagnosis not present

## 2016-04-24 DIAGNOSIS — I639 Cerebral infarction, unspecified: Secondary | ICD-10-CM | POA: Diagnosis not present

## 2016-04-24 DIAGNOSIS — G2581 Restless legs syndrome: Secondary | ICD-10-CM | POA: Diagnosis not present

## 2016-04-24 DIAGNOSIS — I69859 Hemiplegia and hemiparesis following other cerebrovascular disease affecting unspecified side: Secondary | ICD-10-CM | POA: Diagnosis not present

## 2016-04-24 DIAGNOSIS — M6281 Muscle weakness (generalized): Secondary | ICD-10-CM | POA: Diagnosis not present

## 2016-04-24 DIAGNOSIS — F0281 Dementia in other diseases classified elsewhere with behavioral disturbance: Secondary | ICD-10-CM | POA: Diagnosis not present

## 2016-04-24 DIAGNOSIS — R41841 Cognitive communication deficit: Secondary | ICD-10-CM | POA: Diagnosis not present

## 2016-04-27 DIAGNOSIS — I639 Cerebral infarction, unspecified: Secondary | ICD-10-CM | POA: Diagnosis not present

## 2016-04-27 DIAGNOSIS — G2581 Restless legs syndrome: Secondary | ICD-10-CM | POA: Diagnosis not present

## 2016-04-27 DIAGNOSIS — R41841 Cognitive communication deficit: Secondary | ICD-10-CM | POA: Diagnosis not present

## 2016-04-27 DIAGNOSIS — F0281 Dementia in other diseases classified elsewhere with behavioral disturbance: Secondary | ICD-10-CM | POA: Diagnosis not present

## 2016-04-27 DIAGNOSIS — M6281 Muscle weakness (generalized): Secondary | ICD-10-CM | POA: Diagnosis not present

## 2016-04-27 DIAGNOSIS — I69859 Hemiplegia and hemiparesis following other cerebrovascular disease affecting unspecified side: Secondary | ICD-10-CM | POA: Diagnosis not present

## 2016-04-28 DIAGNOSIS — M6281 Muscle weakness (generalized): Secondary | ICD-10-CM | POA: Diagnosis not present

## 2016-04-28 DIAGNOSIS — R41841 Cognitive communication deficit: Secondary | ICD-10-CM | POA: Diagnosis not present

## 2016-04-28 DIAGNOSIS — G2581 Restless legs syndrome: Secondary | ICD-10-CM | POA: Diagnosis not present

## 2016-04-28 DIAGNOSIS — L89620 Pressure ulcer of left heel, unstageable: Secondary | ICD-10-CM | POA: Diagnosis not present

## 2016-04-28 DIAGNOSIS — I69859 Hemiplegia and hemiparesis following other cerebrovascular disease affecting unspecified side: Secondary | ICD-10-CM | POA: Diagnosis not present

## 2016-04-28 DIAGNOSIS — F0281 Dementia in other diseases classified elsewhere with behavioral disturbance: Secondary | ICD-10-CM | POA: Diagnosis not present

## 2016-04-28 DIAGNOSIS — I639 Cerebral infarction, unspecified: Secondary | ICD-10-CM | POA: Diagnosis not present

## 2016-04-29 DIAGNOSIS — R41841 Cognitive communication deficit: Secondary | ICD-10-CM | POA: Diagnosis not present

## 2016-04-29 DIAGNOSIS — M6281 Muscle weakness (generalized): Secondary | ICD-10-CM | POA: Diagnosis not present

## 2016-04-29 DIAGNOSIS — I69859 Hemiplegia and hemiparesis following other cerebrovascular disease affecting unspecified side: Secondary | ICD-10-CM | POA: Diagnosis not present

## 2016-04-29 DIAGNOSIS — F0281 Dementia in other diseases classified elsewhere with behavioral disturbance: Secondary | ICD-10-CM | POA: Diagnosis not present

## 2016-04-29 DIAGNOSIS — I639 Cerebral infarction, unspecified: Secondary | ICD-10-CM | POA: Diagnosis not present

## 2016-04-29 DIAGNOSIS — G2581 Restless legs syndrome: Secondary | ICD-10-CM | POA: Diagnosis not present

## 2016-04-30 DIAGNOSIS — R41841 Cognitive communication deficit: Secondary | ICD-10-CM | POA: Diagnosis not present

## 2016-04-30 DIAGNOSIS — M6281 Muscle weakness (generalized): Secondary | ICD-10-CM | POA: Diagnosis not present

## 2016-04-30 DIAGNOSIS — I639 Cerebral infarction, unspecified: Secondary | ICD-10-CM | POA: Diagnosis not present

## 2016-04-30 DIAGNOSIS — I69859 Hemiplegia and hemiparesis following other cerebrovascular disease affecting unspecified side: Secondary | ICD-10-CM | POA: Diagnosis not present

## 2016-04-30 DIAGNOSIS — F0281 Dementia in other diseases classified elsewhere with behavioral disturbance: Secondary | ICD-10-CM | POA: Diagnosis not present

## 2016-04-30 DIAGNOSIS — G2581 Restless legs syndrome: Secondary | ICD-10-CM | POA: Diagnosis not present

## 2016-05-01 DIAGNOSIS — R41841 Cognitive communication deficit: Secondary | ICD-10-CM | POA: Diagnosis not present

## 2016-05-01 DIAGNOSIS — G2581 Restless legs syndrome: Secondary | ICD-10-CM | POA: Diagnosis not present

## 2016-05-01 DIAGNOSIS — I639 Cerebral infarction, unspecified: Secondary | ICD-10-CM | POA: Diagnosis not present

## 2016-05-01 DIAGNOSIS — F0281 Dementia in other diseases classified elsewhere with behavioral disturbance: Secondary | ICD-10-CM | POA: Diagnosis not present

## 2016-05-01 DIAGNOSIS — I69859 Hemiplegia and hemiparesis following other cerebrovascular disease affecting unspecified side: Secondary | ICD-10-CM | POA: Diagnosis not present

## 2016-05-01 DIAGNOSIS — M6281 Muscle weakness (generalized): Secondary | ICD-10-CM | POA: Diagnosis not present

## 2016-05-03 DIAGNOSIS — M6281 Muscle weakness (generalized): Secondary | ICD-10-CM | POA: Diagnosis not present

## 2016-05-03 DIAGNOSIS — G2581 Restless legs syndrome: Secondary | ICD-10-CM | POA: Diagnosis not present

## 2016-05-03 DIAGNOSIS — F0281 Dementia in other diseases classified elsewhere with behavioral disturbance: Secondary | ICD-10-CM | POA: Diagnosis not present

## 2016-05-03 DIAGNOSIS — I639 Cerebral infarction, unspecified: Secondary | ICD-10-CM | POA: Diagnosis not present

## 2016-05-03 DIAGNOSIS — I69859 Hemiplegia and hemiparesis following other cerebrovascular disease affecting unspecified side: Secondary | ICD-10-CM | POA: Diagnosis not present

## 2016-05-03 DIAGNOSIS — R41841 Cognitive communication deficit: Secondary | ICD-10-CM | POA: Diagnosis not present

## 2016-05-04 DIAGNOSIS — F0281 Dementia in other diseases classified elsewhere with behavioral disturbance: Secondary | ICD-10-CM | POA: Diagnosis not present

## 2016-05-04 DIAGNOSIS — M6281 Muscle weakness (generalized): Secondary | ICD-10-CM | POA: Diagnosis not present

## 2016-05-04 DIAGNOSIS — I639 Cerebral infarction, unspecified: Secondary | ICD-10-CM | POA: Diagnosis not present

## 2016-05-04 DIAGNOSIS — R41841 Cognitive communication deficit: Secondary | ICD-10-CM | POA: Diagnosis not present

## 2016-05-04 DIAGNOSIS — G2581 Restless legs syndrome: Secondary | ICD-10-CM | POA: Diagnosis not present

## 2016-05-04 DIAGNOSIS — I69859 Hemiplegia and hemiparesis following other cerebrovascular disease affecting unspecified side: Secondary | ICD-10-CM | POA: Diagnosis not present

## 2016-05-05 DIAGNOSIS — G2581 Restless legs syndrome: Secondary | ICD-10-CM | POA: Diagnosis not present

## 2016-05-05 DIAGNOSIS — L89620 Pressure ulcer of left heel, unstageable: Secondary | ICD-10-CM | POA: Diagnosis not present

## 2016-05-05 DIAGNOSIS — M6281 Muscle weakness (generalized): Secondary | ICD-10-CM | POA: Diagnosis not present

## 2016-05-05 DIAGNOSIS — I639 Cerebral infarction, unspecified: Secondary | ICD-10-CM | POA: Diagnosis not present

## 2016-05-05 DIAGNOSIS — F0281 Dementia in other diseases classified elsewhere with behavioral disturbance: Secondary | ICD-10-CM | POA: Diagnosis not present

## 2016-05-05 DIAGNOSIS — I69859 Hemiplegia and hemiparesis following other cerebrovascular disease affecting unspecified side: Secondary | ICD-10-CM | POA: Diagnosis not present

## 2016-05-05 DIAGNOSIS — R41841 Cognitive communication deficit: Secondary | ICD-10-CM | POA: Diagnosis not present

## 2016-05-06 DIAGNOSIS — M6281 Muscle weakness (generalized): Secondary | ICD-10-CM | POA: Diagnosis not present

## 2016-05-06 DIAGNOSIS — I69859 Hemiplegia and hemiparesis following other cerebrovascular disease affecting unspecified side: Secondary | ICD-10-CM | POA: Diagnosis not present

## 2016-05-06 DIAGNOSIS — R41841 Cognitive communication deficit: Secondary | ICD-10-CM | POA: Diagnosis not present

## 2016-05-06 DIAGNOSIS — I639 Cerebral infarction, unspecified: Secondary | ICD-10-CM | POA: Diagnosis not present

## 2016-05-06 DIAGNOSIS — F0281 Dementia in other diseases classified elsewhere with behavioral disturbance: Secondary | ICD-10-CM | POA: Diagnosis not present

## 2016-05-06 DIAGNOSIS — G2581 Restless legs syndrome: Secondary | ICD-10-CM | POA: Diagnosis not present

## 2016-05-07 DIAGNOSIS — I69859 Hemiplegia and hemiparesis following other cerebrovascular disease affecting unspecified side: Secondary | ICD-10-CM | POA: Diagnosis not present

## 2016-05-07 DIAGNOSIS — G2581 Restless legs syndrome: Secondary | ICD-10-CM | POA: Diagnosis not present

## 2016-05-07 DIAGNOSIS — F0281 Dementia in other diseases classified elsewhere with behavioral disturbance: Secondary | ICD-10-CM | POA: Diagnosis not present

## 2016-05-07 DIAGNOSIS — R41841 Cognitive communication deficit: Secondary | ICD-10-CM | POA: Diagnosis not present

## 2016-05-07 DIAGNOSIS — I639 Cerebral infarction, unspecified: Secondary | ICD-10-CM | POA: Diagnosis not present

## 2016-05-07 DIAGNOSIS — M6281 Muscle weakness (generalized): Secondary | ICD-10-CM | POA: Diagnosis not present

## 2016-05-10 DIAGNOSIS — G2581 Restless legs syndrome: Secondary | ICD-10-CM | POA: Diagnosis not present

## 2016-05-10 DIAGNOSIS — I639 Cerebral infarction, unspecified: Secondary | ICD-10-CM | POA: Diagnosis not present

## 2016-05-10 DIAGNOSIS — M6281 Muscle weakness (generalized): Secondary | ICD-10-CM | POA: Diagnosis not present

## 2016-05-10 DIAGNOSIS — R41841 Cognitive communication deficit: Secondary | ICD-10-CM | POA: Diagnosis not present

## 2016-05-10 DIAGNOSIS — I69859 Hemiplegia and hemiparesis following other cerebrovascular disease affecting unspecified side: Secondary | ICD-10-CM | POA: Diagnosis not present

## 2016-05-10 DIAGNOSIS — F0281 Dementia in other diseases classified elsewhere with behavioral disturbance: Secondary | ICD-10-CM | POA: Diagnosis not present

## 2016-05-11 DIAGNOSIS — I69859 Hemiplegia and hemiparesis following other cerebrovascular disease affecting unspecified side: Secondary | ICD-10-CM | POA: Diagnosis not present

## 2016-05-11 DIAGNOSIS — I639 Cerebral infarction, unspecified: Secondary | ICD-10-CM | POA: Diagnosis not present

## 2016-05-11 DIAGNOSIS — M6281 Muscle weakness (generalized): Secondary | ICD-10-CM | POA: Diagnosis not present

## 2016-05-11 DIAGNOSIS — G2581 Restless legs syndrome: Secondary | ICD-10-CM | POA: Diagnosis not present

## 2016-05-11 DIAGNOSIS — R41841 Cognitive communication deficit: Secondary | ICD-10-CM | POA: Diagnosis not present

## 2016-05-11 DIAGNOSIS — F0281 Dementia in other diseases classified elsewhere with behavioral disturbance: Secondary | ICD-10-CM | POA: Diagnosis not present

## 2016-05-12 DIAGNOSIS — L89623 Pressure ulcer of left heel, stage 3: Secondary | ICD-10-CM | POA: Diagnosis not present

## 2016-05-12 DIAGNOSIS — G2581 Restless legs syndrome: Secondary | ICD-10-CM | POA: Diagnosis not present

## 2016-05-12 DIAGNOSIS — R41841 Cognitive communication deficit: Secondary | ICD-10-CM | POA: Diagnosis not present

## 2016-05-12 DIAGNOSIS — M6281 Muscle weakness (generalized): Secondary | ICD-10-CM | POA: Diagnosis not present

## 2016-05-12 DIAGNOSIS — F0281 Dementia in other diseases classified elsewhere with behavioral disturbance: Secondary | ICD-10-CM | POA: Diagnosis not present

## 2016-05-12 DIAGNOSIS — I639 Cerebral infarction, unspecified: Secondary | ICD-10-CM | POA: Diagnosis not present

## 2016-05-12 DIAGNOSIS — I69859 Hemiplegia and hemiparesis following other cerebrovascular disease affecting unspecified side: Secondary | ICD-10-CM | POA: Diagnosis not present

## 2016-05-14 DIAGNOSIS — M6281 Muscle weakness (generalized): Secondary | ICD-10-CM | POA: Diagnosis not present

## 2016-05-14 DIAGNOSIS — I639 Cerebral infarction, unspecified: Secondary | ICD-10-CM | POA: Diagnosis not present

## 2016-05-14 DIAGNOSIS — G2581 Restless legs syndrome: Secondary | ICD-10-CM | POA: Diagnosis not present

## 2016-05-14 DIAGNOSIS — R41841 Cognitive communication deficit: Secondary | ICD-10-CM | POA: Diagnosis not present

## 2016-05-14 DIAGNOSIS — F0281 Dementia in other diseases classified elsewhere with behavioral disturbance: Secondary | ICD-10-CM | POA: Diagnosis not present

## 2016-05-14 DIAGNOSIS — I69859 Hemiplegia and hemiparesis following other cerebrovascular disease affecting unspecified side: Secondary | ICD-10-CM | POA: Diagnosis not present

## 2016-05-15 DIAGNOSIS — M6281 Muscle weakness (generalized): Secondary | ICD-10-CM | POA: Diagnosis not present

## 2016-05-15 DIAGNOSIS — R41841 Cognitive communication deficit: Secondary | ICD-10-CM | POA: Diagnosis not present

## 2016-05-15 DIAGNOSIS — I69859 Hemiplegia and hemiparesis following other cerebrovascular disease affecting unspecified side: Secondary | ICD-10-CM | POA: Diagnosis not present

## 2016-05-15 DIAGNOSIS — F0281 Dementia in other diseases classified elsewhere with behavioral disturbance: Secondary | ICD-10-CM | POA: Diagnosis not present

## 2016-05-15 DIAGNOSIS — I639 Cerebral infarction, unspecified: Secondary | ICD-10-CM | POA: Diagnosis not present

## 2016-05-15 DIAGNOSIS — G2581 Restless legs syndrome: Secondary | ICD-10-CM | POA: Diagnosis not present

## 2016-05-18 DIAGNOSIS — I639 Cerebral infarction, unspecified: Secondary | ICD-10-CM | POA: Diagnosis not present

## 2016-05-18 DIAGNOSIS — I69859 Hemiplegia and hemiparesis following other cerebrovascular disease affecting unspecified side: Secondary | ICD-10-CM | POA: Diagnosis not present

## 2016-05-18 DIAGNOSIS — M6281 Muscle weakness (generalized): Secondary | ICD-10-CM | POA: Diagnosis not present

## 2016-05-18 DIAGNOSIS — R41841 Cognitive communication deficit: Secondary | ICD-10-CM | POA: Diagnosis not present

## 2016-05-18 DIAGNOSIS — G2581 Restless legs syndrome: Secondary | ICD-10-CM | POA: Diagnosis not present

## 2016-05-18 DIAGNOSIS — F0281 Dementia in other diseases classified elsewhere with behavioral disturbance: Secondary | ICD-10-CM | POA: Diagnosis not present

## 2016-05-19 DIAGNOSIS — I639 Cerebral infarction, unspecified: Secondary | ICD-10-CM | POA: Diagnosis not present

## 2016-05-19 DIAGNOSIS — R41841 Cognitive communication deficit: Secondary | ICD-10-CM | POA: Diagnosis not present

## 2016-05-19 DIAGNOSIS — I69859 Hemiplegia and hemiparesis following other cerebrovascular disease affecting unspecified side: Secondary | ICD-10-CM | POA: Diagnosis not present

## 2016-05-19 DIAGNOSIS — M6281 Muscle weakness (generalized): Secondary | ICD-10-CM | POA: Diagnosis not present

## 2016-05-19 DIAGNOSIS — R293 Abnormal posture: Secondary | ICD-10-CM | POA: Diagnosis not present

## 2016-05-19 DIAGNOSIS — L89623 Pressure ulcer of left heel, stage 3: Secondary | ICD-10-CM | POA: Diagnosis not present

## 2016-05-19 DIAGNOSIS — F0281 Dementia in other diseases classified elsewhere with behavioral disturbance: Secondary | ICD-10-CM | POA: Diagnosis not present

## 2016-05-19 DIAGNOSIS — G2581 Restless legs syndrome: Secondary | ICD-10-CM | POA: Diagnosis not present

## 2016-05-19 DIAGNOSIS — E1122 Type 2 diabetes mellitus with diabetic chronic kidney disease: Secondary | ICD-10-CM | POA: Diagnosis not present

## 2016-05-20 DIAGNOSIS — I639 Cerebral infarction, unspecified: Secondary | ICD-10-CM | POA: Diagnosis not present

## 2016-05-20 DIAGNOSIS — I69859 Hemiplegia and hemiparesis following other cerebrovascular disease affecting unspecified side: Secondary | ICD-10-CM | POA: Diagnosis not present

## 2016-05-20 DIAGNOSIS — F0281 Dementia in other diseases classified elsewhere with behavioral disturbance: Secondary | ICD-10-CM | POA: Diagnosis not present

## 2016-05-20 DIAGNOSIS — R41841 Cognitive communication deficit: Secondary | ICD-10-CM | POA: Diagnosis not present

## 2016-05-20 DIAGNOSIS — E1122 Type 2 diabetes mellitus with diabetic chronic kidney disease: Secondary | ICD-10-CM | POA: Diagnosis not present

## 2016-05-20 DIAGNOSIS — R293 Abnormal posture: Secondary | ICD-10-CM | POA: Diagnosis not present

## 2016-05-21 ENCOUNTER — Non-Acute Institutional Stay (SKILLED_NURSING_FACILITY): Payer: Medicare Other | Admitting: Adult Health

## 2016-05-21 ENCOUNTER — Encounter: Payer: Self-pay | Admitting: Adult Health

## 2016-05-21 DIAGNOSIS — I6330 Cerebral infarction due to thrombosis of unspecified cerebral artery: Secondary | ICD-10-CM | POA: Diagnosis not present

## 2016-05-21 DIAGNOSIS — R1314 Dysphagia, pharyngoesophageal phase: Secondary | ICD-10-CM | POA: Diagnosis not present

## 2016-05-21 DIAGNOSIS — L89620 Pressure ulcer of left heel, unstageable: Secondary | ICD-10-CM

## 2016-05-21 DIAGNOSIS — N183 Chronic kidney disease, stage 3 unspecified: Secondary | ICD-10-CM

## 2016-05-21 DIAGNOSIS — I10 Essential (primary) hypertension: Secondary | ICD-10-CM | POA: Diagnosis not present

## 2016-05-21 DIAGNOSIS — D631 Anemia in chronic kidney disease: Secondary | ICD-10-CM

## 2016-05-21 DIAGNOSIS — F0281 Dementia in other diseases classified elsewhere with behavioral disturbance: Secondary | ICD-10-CM | POA: Diagnosis not present

## 2016-05-21 DIAGNOSIS — F0150 Vascular dementia without behavioral disturbance: Secondary | ICD-10-CM | POA: Diagnosis not present

## 2016-05-21 DIAGNOSIS — I69859 Hemiplegia and hemiparesis following other cerebrovascular disease affecting unspecified side: Secondary | ICD-10-CM | POA: Diagnosis not present

## 2016-05-21 DIAGNOSIS — I639 Cerebral infarction, unspecified: Secondary | ICD-10-CM | POA: Diagnosis not present

## 2016-05-21 DIAGNOSIS — R41841 Cognitive communication deficit: Secondary | ICD-10-CM | POA: Diagnosis not present

## 2016-05-21 DIAGNOSIS — E1122 Type 2 diabetes mellitus with diabetic chronic kidney disease: Secondary | ICD-10-CM | POA: Diagnosis not present

## 2016-05-21 DIAGNOSIS — E034 Atrophy of thyroid (acquired): Secondary | ICD-10-CM

## 2016-05-21 DIAGNOSIS — R293 Abnormal posture: Secondary | ICD-10-CM | POA: Diagnosis not present

## 2016-05-21 DIAGNOSIS — I69354 Hemiplegia and hemiparesis following cerebral infarction affecting left non-dominant side: Secondary | ICD-10-CM | POA: Diagnosis not present

## 2016-05-21 NOTE — Progress Notes (Addendum)
.  Location:   [facility] Room Number: 112 B Place of Service:  SNF (31)   CODE STATUS: Full Code  Allergies  Allergen Reactions  . Aspirin Hives  . Penicillins Hives    Chief Complaint  Patient presents with  . Medical Management of Chronic Issues    1 month follow up    HPI:  She is a long term resident of this facility being seen for the management of her chronic illnesses. Overall her status is stable. She tells me today that she is feeling good and has no concerns. She has lost weight from 165 pounds in Feb 2018; to her current weight of 134 pounds.   Past Medical History:  Diagnosis Date  . Anemia   . Arthritis   . Chronic kidney disease   . Diabetes mellitus without complication ([place])   . Hemorrhoid   . Hypertension   . Stroke ([place])   . Thyroid disease     Past Surgical History:  Procedure Laterality Date  . ABDOMINAL HYSTERECTOMY     PARTIAL  . BREAST LUMPECTOMY     LEFT  . MASTECTOMY  05/2008   Left    Social History   Social History  . Marital status: Single    Spouse name: N/A  . Number of children: N/A  . Years of education: N/A   Occupational History  . Not on file.   Social History Main Topics  . Smoking status: Never Smoker  . Smokeless tobacco: Never Used  . Alcohol use No  . Drug use: No  . Sexual activity: Not on file   Other Topics Concern  . Not on file   Social History Narrative  . No narrative on file   History reviewed. No pertinent family history.  Vital signs: 144/60 P 57; R 18; T 98.9; 93% 02 sat 93   Patient's Medications  New Prescriptions   No medications on file  Previous Medications   AMINO ACIDS-PROTEIN HYDROLYS (FEEDING SUPPLEMENT, PRO-STAT SUGAR FREE 64,) LIQD    Take 30 mLs by mouth 3 (three) times daily with meals.   AMLODIPINE (NORVASC) 10 MG TABLET    Take 10 mg by mouth daily.   ASCORBIC ACID (VITAMIN C) 1000 MG TABLET    Take 1,000 mg by mouth daily.   CHOLECALCIFEROL (VITAMIN D)  2000 UNITS TABLET    Take 2,000 Units by mouth daily.   CLOPIDOGREL (PLAVIX) 75 MG TABLET    Take 1 tablet (75 mg total) by mouth daily.   DONEPEZIL (ARICEPT) 5 MG TABLET    Take 5 mg by mouth at bedtime.    FERROUS SULFATE 325 (65 FE) MG TABLET    Take 325 mg by mouth 2 (two) times daily with a meal.   HYDROCODONE-ACETAMINOPHEN (NORCO) 10-325 MG PER TABLET    Take 1 tablet by mouth 2 (two) times daily.   LEVOTHYROXINE (SYNTHROID, LEVOTHROID) 137 MCG TABLET    Take 137 mcg by mouth daily before breakfast.   LISINOPRIL (PRINIVIL,ZESTRIL) 40 MG TABLET    Take 40 mg by mouth daily.   LORAZEPAM (ATIVAN) 0.5 MG TABLET    Give one tablet 1 time daily, give 1 tablet by mouth as needed two times daily   METOPROLOL TARTRATE (LOPRESSOR) 25 MG TABLET    Take 12.5 mg by mouth 2 (two) times daily.   NUTRITIONAL SUPPLEMENTS (NUTRITIONAL SUPPLEMENT PO)    Take 120 mLs by mouth 4 (four) times daily. Puree texture, regular  consistency   PAROXETINE (PAXIL) 40 MG TABLET    Take 40 mg by mouth daily.   ROPINIROLE (REQUIP) 3 MG TABLET    Take 3 mg by mouth at bedtime.    SILVER HYDROGEL EX    Apply to left heel topically every day shift for wound care   UNABLE TO [place] Cup every day with lunch and dinner   VITAMIN B-12 1000 MCG TABLET    Take 1 tablet (1,000 mcg total) by mouth daily.  Modified Medications   No medications on file  Discontinued Medications   COLLAGENASE (SANTYL) OINTMENT    Apply 1 application topically daily. To left heel   ZINC SULFATE 220 (50 ZN) MG CAPSULE    Take 220 mg by mouth daily.     SIGNIFICANT DIAGNOSTIC EXAMS  03-25-15: chest x-ray: no acute cardiopulmonary disease; no pneumonia   04-17-16: ABI: Right ABI: 0.86; Left ABI: compatible with significant arterial wall calcification.    LABS REVIEWED:   05-24-15: wbc 5.0; hgb 8.3; hct 27.1; mcv 79.2; plt 200; glucose 95; bun 35; creat 1.36; k+ 4.2; na++ 140 liver normal albumin 2.9; chol 111; ldl 51; trig 75;  hdl 45; tsh 4.38; hgb a1c 5.5; urine micro-albumin 23.6  08-31-15: wbc 9.7; hgb 11.1; hct 35.2; mcv 78.6; plt 275; glucose 210; bun 31; creat 1.57; k+ 4.6; na++ 142 09-04-15: urine culture: 1,000 colonies  e-coli  09-18-15: tsh 97.79; free T3: 1.5; free T4: 0.6  10-25-15:tsh 36.56: free T4: 0.8; free T3: 1.8  12-17-15: tsh 7.45; free T3: 2.4; free T4: 1.19  02-24-16: wbc 6.3; hgb 9.9; hct 33.4; mcv 83.3 plt 196; glucose 82; bun 18.0; creat 0.99; k+ 4.7 ;na++ 142; liver normal albumin 2.9 chol 118; ldl 54; trig 141; hdl 36; hgb a1c 5.3; iron 47; tibc 113; ferritin 1214.00    Review of Systems  Unable to perform ROS: Other      Physical Exam  Constitutional: No distress.  Eyes: Conjunctivae are normal.  Neck: Neck supple. No JVD present. No thyromegaly present.  Cardiovascular: Normal rate, regular rhythm and intact distal pulses. Pedal pulses faint  Respiratory: Effort normal and breath sounds normal. No respiratory distress. She has no wheezes. Status post left mastectomy   GI: Soft. Bowel sounds are normal. She exhibits no distension. There is no tenderness.  Musculoskeletal: She exhibits no edema.  Left hemiparesis    Lymphadenopathy:    She has no cervical adenopathy.  Neurological: She is alert.  Skin: Skin is warm and dry. She is not diaphoretic.  Left heel: stage III:  1.9 x 1.2 x 0.2 cm 100% granulation Psychiatric: She has a normal mood and affect.     ASSESSMENT/ PLAN:  1. CVA: with left hemiparesis: is neurologically stable; will continue plavix 75 mg daily takes vicodin 10/325 mg twice daily for pain management and will monitor   2. Hypothyroidism: will continue synthroid 137 mcg daily and will monitor her free t4: 1.19; tsh 7.45  3. Anemia: is presently stable will continue iron twice daily; vit b12 1000 mcg daily  hgb 9.9 iron level is up to 47 from  13; will monitor  4. Diabetes:  hgb a1c 5.3; is presently not on medications;  Is on ace and ldl is 54; will not make  changes will monitor her status.   5. Hypertension: b/p 109/73  will continue  lopressor 12.5 mg twice daily  lisinopril  40 mg daily  norvasc  10 mg daily   6. Restless leg syndrome: will continue requip  3 mg nightly   7.  Depression with anxiety: will continue paxil 40 mg daily has ativan 0.5 mg daily and twice daily as needed. She requires the prn dosing to manage her acute episodes of anxiety and has been on this medication long term.   8. ckd stage III:  Her bun/creat is 18/0.99; will monitor   9. Hypokalemia:  K+ is 4.7 is not on supplement at this time will monitor   10. Left breast cancer T1N1 ER/PR +, HER 2 negative, 2 sentinel nodes involved:  post left mastectomy with 3 node axillary evaluation and 5 years of Femara thru 06-2013.  Will monitor   11. Dysphagia: no signs of aspiration present; will continue nectar thick liquids.   12. Dementia: will continue aricept 5 mg nightly  Her weight is 134 pounds.   13. Protein calorie malnutrition: weight loss: current weight is 134 pounds her weight in Feb 2018: 165 pounds.  her albumin is 2.9 will continue supplement per facility protocol  Will begin eldertonic 15 cc prior to lunch and supper.    14. Left  heel ulceration stage III: will continue current treatment plan. Is followed by wound doctor    MD is aware of resident's narcotic use and is in agreement with current plan of care. We will attempt to wean resident as apropriate   Ok Edwards NP [facility]  Contact [phone] Monday through Friday 8am- 5pm  After hours call [phone]

## 2016-05-22 DIAGNOSIS — R293 Abnormal posture: Secondary | ICD-10-CM | POA: Diagnosis not present

## 2016-05-22 DIAGNOSIS — R41841 Cognitive communication deficit: Secondary | ICD-10-CM | POA: Diagnosis not present

## 2016-05-22 DIAGNOSIS — I69859 Hemiplegia and hemiparesis following other cerebrovascular disease affecting unspecified side: Secondary | ICD-10-CM | POA: Diagnosis not present

## 2016-05-22 DIAGNOSIS — F0281 Dementia in other diseases classified elsewhere with behavioral disturbance: Secondary | ICD-10-CM | POA: Diagnosis not present

## 2016-05-22 DIAGNOSIS — E1122 Type 2 diabetes mellitus with diabetic chronic kidney disease: Secondary | ICD-10-CM | POA: Diagnosis not present

## 2016-05-22 DIAGNOSIS — I639 Cerebral infarction, unspecified: Secondary | ICD-10-CM | POA: Diagnosis not present

## 2016-05-24 DIAGNOSIS — F0281 Dementia in other diseases classified elsewhere with behavioral disturbance: Secondary | ICD-10-CM | POA: Diagnosis not present

## 2016-05-24 DIAGNOSIS — E1122 Type 2 diabetes mellitus with diabetic chronic kidney disease: Secondary | ICD-10-CM | POA: Diagnosis not present

## 2016-05-24 DIAGNOSIS — I639 Cerebral infarction, unspecified: Secondary | ICD-10-CM | POA: Diagnosis not present

## 2016-05-24 DIAGNOSIS — R41841 Cognitive communication deficit: Secondary | ICD-10-CM | POA: Diagnosis not present

## 2016-05-24 DIAGNOSIS — I69859 Hemiplegia and hemiparesis following other cerebrovascular disease affecting unspecified side: Secondary | ICD-10-CM | POA: Diagnosis not present

## 2016-05-24 DIAGNOSIS — R293 Abnormal posture: Secondary | ICD-10-CM | POA: Diagnosis not present

## 2016-05-25 DIAGNOSIS — E1122 Type 2 diabetes mellitus with diabetic chronic kidney disease: Secondary | ICD-10-CM | POA: Diagnosis not present

## 2016-05-25 DIAGNOSIS — I69859 Hemiplegia and hemiparesis following other cerebrovascular disease affecting unspecified side: Secondary | ICD-10-CM | POA: Diagnosis not present

## 2016-05-25 DIAGNOSIS — R41841 Cognitive communication deficit: Secondary | ICD-10-CM | POA: Diagnosis not present

## 2016-05-25 DIAGNOSIS — I639 Cerebral infarction, unspecified: Secondary | ICD-10-CM | POA: Diagnosis not present

## 2016-05-25 DIAGNOSIS — F0281 Dementia in other diseases classified elsewhere with behavioral disturbance: Secondary | ICD-10-CM | POA: Diagnosis not present

## 2016-05-25 DIAGNOSIS — R293 Abnormal posture: Secondary | ICD-10-CM | POA: Diagnosis not present

## 2016-05-26 DIAGNOSIS — R41841 Cognitive communication deficit: Secondary | ICD-10-CM | POA: Diagnosis not present

## 2016-05-26 DIAGNOSIS — I69859 Hemiplegia and hemiparesis following other cerebrovascular disease affecting unspecified side: Secondary | ICD-10-CM | POA: Diagnosis not present

## 2016-05-26 DIAGNOSIS — F0281 Dementia in other diseases classified elsewhere with behavioral disturbance: Secondary | ICD-10-CM | POA: Diagnosis not present

## 2016-05-26 DIAGNOSIS — R293 Abnormal posture: Secondary | ICD-10-CM | POA: Diagnosis not present

## 2016-05-26 DIAGNOSIS — I639 Cerebral infarction, unspecified: Secondary | ICD-10-CM | POA: Diagnosis not present

## 2016-05-26 DIAGNOSIS — L89623 Pressure ulcer of left heel, stage 3: Secondary | ICD-10-CM | POA: Diagnosis not present

## 2016-05-26 DIAGNOSIS — E1122 Type 2 diabetes mellitus with diabetic chronic kidney disease: Secondary | ICD-10-CM | POA: Diagnosis not present

## 2016-05-27 DIAGNOSIS — F0281 Dementia in other diseases classified elsewhere with behavioral disturbance: Secondary | ICD-10-CM | POA: Diagnosis not present

## 2016-05-27 DIAGNOSIS — R293 Abnormal posture: Secondary | ICD-10-CM | POA: Diagnosis not present

## 2016-05-27 DIAGNOSIS — I69859 Hemiplegia and hemiparesis following other cerebrovascular disease affecting unspecified side: Secondary | ICD-10-CM | POA: Diagnosis not present

## 2016-05-27 DIAGNOSIS — R41841 Cognitive communication deficit: Secondary | ICD-10-CM | POA: Diagnosis not present

## 2016-05-27 DIAGNOSIS — E1122 Type 2 diabetes mellitus with diabetic chronic kidney disease: Secondary | ICD-10-CM | POA: Diagnosis not present

## 2016-05-27 DIAGNOSIS — I639 Cerebral infarction, unspecified: Secondary | ICD-10-CM | POA: Diagnosis not present

## 2016-05-29 DIAGNOSIS — R293 Abnormal posture: Secondary | ICD-10-CM | POA: Diagnosis not present

## 2016-05-29 DIAGNOSIS — E1122 Type 2 diabetes mellitus with diabetic chronic kidney disease: Secondary | ICD-10-CM | POA: Diagnosis not present

## 2016-05-29 DIAGNOSIS — F0281 Dementia in other diseases classified elsewhere with behavioral disturbance: Secondary | ICD-10-CM | POA: Diagnosis not present

## 2016-05-29 DIAGNOSIS — I69859 Hemiplegia and hemiparesis following other cerebrovascular disease affecting unspecified side: Secondary | ICD-10-CM | POA: Diagnosis not present

## 2016-05-29 DIAGNOSIS — I639 Cerebral infarction, unspecified: Secondary | ICD-10-CM | POA: Diagnosis not present

## 2016-05-29 DIAGNOSIS — R41841 Cognitive communication deficit: Secondary | ICD-10-CM | POA: Diagnosis not present

## 2016-06-01 DIAGNOSIS — E1122 Type 2 diabetes mellitus with diabetic chronic kidney disease: Secondary | ICD-10-CM | POA: Diagnosis not present

## 2016-06-01 DIAGNOSIS — I639 Cerebral infarction, unspecified: Secondary | ICD-10-CM | POA: Diagnosis not present

## 2016-06-01 DIAGNOSIS — R41841 Cognitive communication deficit: Secondary | ICD-10-CM | POA: Diagnosis not present

## 2016-06-01 DIAGNOSIS — I69859 Hemiplegia and hemiparesis following other cerebrovascular disease affecting unspecified side: Secondary | ICD-10-CM | POA: Diagnosis not present

## 2016-06-01 DIAGNOSIS — R293 Abnormal posture: Secondary | ICD-10-CM | POA: Diagnosis not present

## 2016-06-01 DIAGNOSIS — F0281 Dementia in other diseases classified elsewhere with behavioral disturbance: Secondary | ICD-10-CM | POA: Diagnosis not present

## 2016-06-02 DIAGNOSIS — R41841 Cognitive communication deficit: Secondary | ICD-10-CM | POA: Diagnosis not present

## 2016-06-02 DIAGNOSIS — E1122 Type 2 diabetes mellitus with diabetic chronic kidney disease: Secondary | ICD-10-CM | POA: Diagnosis not present

## 2016-06-02 DIAGNOSIS — I69859 Hemiplegia and hemiparesis following other cerebrovascular disease affecting unspecified side: Secondary | ICD-10-CM | POA: Diagnosis not present

## 2016-06-02 DIAGNOSIS — F0281 Dementia in other diseases classified elsewhere with behavioral disturbance: Secondary | ICD-10-CM | POA: Diagnosis not present

## 2016-06-02 DIAGNOSIS — I639 Cerebral infarction, unspecified: Secondary | ICD-10-CM | POA: Diagnosis not present

## 2016-06-02 DIAGNOSIS — L89623 Pressure ulcer of left heel, stage 3: Secondary | ICD-10-CM | POA: Diagnosis not present

## 2016-06-02 DIAGNOSIS — R293 Abnormal posture: Secondary | ICD-10-CM | POA: Diagnosis not present

## 2016-06-03 DIAGNOSIS — R293 Abnormal posture: Secondary | ICD-10-CM | POA: Diagnosis not present

## 2016-06-03 DIAGNOSIS — I639 Cerebral infarction, unspecified: Secondary | ICD-10-CM | POA: Diagnosis not present

## 2016-06-03 DIAGNOSIS — E1122 Type 2 diabetes mellitus with diabetic chronic kidney disease: Secondary | ICD-10-CM | POA: Diagnosis not present

## 2016-06-03 DIAGNOSIS — F0281 Dementia in other diseases classified elsewhere with behavioral disturbance: Secondary | ICD-10-CM | POA: Diagnosis not present

## 2016-06-03 DIAGNOSIS — R41841 Cognitive communication deficit: Secondary | ICD-10-CM | POA: Diagnosis not present

## 2016-06-03 DIAGNOSIS — I69859 Hemiplegia and hemiparesis following other cerebrovascular disease affecting unspecified side: Secondary | ICD-10-CM | POA: Diagnosis not present

## 2016-06-04 DIAGNOSIS — I639 Cerebral infarction, unspecified: Secondary | ICD-10-CM | POA: Diagnosis not present

## 2016-06-04 DIAGNOSIS — I69859 Hemiplegia and hemiparesis following other cerebrovascular disease affecting unspecified side: Secondary | ICD-10-CM | POA: Diagnosis not present

## 2016-06-04 DIAGNOSIS — E1122 Type 2 diabetes mellitus with diabetic chronic kidney disease: Secondary | ICD-10-CM | POA: Diagnosis not present

## 2016-06-04 DIAGNOSIS — F0281 Dementia in other diseases classified elsewhere with behavioral disturbance: Secondary | ICD-10-CM | POA: Diagnosis not present

## 2016-06-04 DIAGNOSIS — R293 Abnormal posture: Secondary | ICD-10-CM | POA: Diagnosis not present

## 2016-06-04 DIAGNOSIS — R41841 Cognitive communication deficit: Secondary | ICD-10-CM | POA: Diagnosis not present

## 2016-06-05 DIAGNOSIS — R41841 Cognitive communication deficit: Secondary | ICD-10-CM | POA: Diagnosis not present

## 2016-06-05 DIAGNOSIS — F0391 Unspecified dementia with behavioral disturbance: Secondary | ICD-10-CM | POA: Diagnosis not present

## 2016-06-05 DIAGNOSIS — E1122 Type 2 diabetes mellitus with diabetic chronic kidney disease: Secondary | ICD-10-CM | POA: Diagnosis not present

## 2016-06-05 DIAGNOSIS — I639 Cerebral infarction, unspecified: Secondary | ICD-10-CM | POA: Diagnosis not present

## 2016-06-05 DIAGNOSIS — I69859 Hemiplegia and hemiparesis following other cerebrovascular disease affecting unspecified side: Secondary | ICD-10-CM | POA: Diagnosis not present

## 2016-06-05 DIAGNOSIS — R293 Abnormal posture: Secondary | ICD-10-CM | POA: Diagnosis not present

## 2016-06-05 DIAGNOSIS — F419 Anxiety disorder, unspecified: Secondary | ICD-10-CM | POA: Diagnosis not present

## 2016-06-05 DIAGNOSIS — F0281 Dementia in other diseases classified elsewhere with behavioral disturbance: Secondary | ICD-10-CM | POA: Diagnosis not present

## 2016-06-09 DIAGNOSIS — L89623 Pressure ulcer of left heel, stage 3: Secondary | ICD-10-CM | POA: Diagnosis not present

## 2016-06-15 DIAGNOSIS — R41841 Cognitive communication deficit: Secondary | ICD-10-CM | POA: Diagnosis not present

## 2016-06-15 DIAGNOSIS — E1122 Type 2 diabetes mellitus with diabetic chronic kidney disease: Secondary | ICD-10-CM | POA: Diagnosis not present

## 2016-06-15 DIAGNOSIS — I639 Cerebral infarction, unspecified: Secondary | ICD-10-CM | POA: Diagnosis not present

## 2016-06-15 DIAGNOSIS — R293 Abnormal posture: Secondary | ICD-10-CM | POA: Diagnosis not present

## 2016-06-15 DIAGNOSIS — F0281 Dementia in other diseases classified elsewhere with behavioral disturbance: Secondary | ICD-10-CM | POA: Diagnosis not present

## 2016-06-15 DIAGNOSIS — I69859 Hemiplegia and hemiparesis following other cerebrovascular disease affecting unspecified side: Secondary | ICD-10-CM | POA: Diagnosis not present

## 2016-06-16 ENCOUNTER — Other Ambulatory Visit: Payer: Self-pay | Admitting: *Deleted

## 2016-06-16 DIAGNOSIS — L89623 Pressure ulcer of left heel, stage 3: Secondary | ICD-10-CM | POA: Diagnosis not present

## 2016-06-16 MED ORDER — HYDROCODONE-ACETAMINOPHEN 10-325 MG PO TABS: 1.0000 | ORAL_TABLET | Freq: Two times a day (BID) | ORAL | 0 refills | Status: DC

## 2016-06-16 NOTE — Telephone Encounter (Signed)
[facility] #: [phone] Fax#: [phone]  

## 2016-06-22 ENCOUNTER — Non-Acute Institutional Stay (SKILLED_NURSING_FACILITY): Payer: Medicare Other

## 2016-06-22 ENCOUNTER — Encounter: Payer: Self-pay | Admitting: Adult Health

## 2016-06-22 ENCOUNTER — Non-Acute Institutional Stay (SKILLED_NURSING_FACILITY): Payer: Medicare Other | Admitting: Adult Health

## 2016-06-22 DIAGNOSIS — I10 Essential (primary) hypertension: Secondary | ICD-10-CM

## 2016-06-22 DIAGNOSIS — I69354 Hemiplegia and hemiparesis following cerebral infarction affecting left non-dominant side: Secondary | ICD-10-CM | POA: Diagnosis not present

## 2016-06-22 DIAGNOSIS — L89620 Pressure ulcer of left heel, unstageable: Secondary | ICD-10-CM | POA: Diagnosis not present

## 2016-06-22 DIAGNOSIS — N183 Chronic kidney disease, stage 3 unspecified: Secondary | ICD-10-CM

## 2016-06-22 DIAGNOSIS — F0150 Vascular dementia without behavioral disturbance: Secondary | ICD-10-CM | POA: Diagnosis not present

## 2016-06-22 DIAGNOSIS — Z0000 Encounter for general adult medical examination without abnormal findings: Secondary | ICD-10-CM

## 2016-06-22 DIAGNOSIS — I6330 Cerebral infarction due to thrombosis of unspecified cerebral artery: Secondary | ICD-10-CM

## 2016-06-22 DIAGNOSIS — E1122 Type 2 diabetes mellitus with diabetic chronic kidney disease: Secondary | ICD-10-CM | POA: Diagnosis not present

## 2016-06-22 DIAGNOSIS — D631 Anemia in chronic kidney disease: Secondary | ICD-10-CM

## 2016-06-22 DIAGNOSIS — R1314 Dysphagia, pharyngoesophageal phase: Secondary | ICD-10-CM

## 2016-06-22 DIAGNOSIS — E034 Atrophy of thyroid (acquired): Secondary | ICD-10-CM | POA: Diagnosis not present

## 2016-06-22 DIAGNOSIS — D649 Anemia, unspecified: Secondary | ICD-10-CM | POA: Diagnosis not present

## 2016-06-22 DIAGNOSIS — R918 Other nonspecific abnormal finding of lung field: Secondary | ICD-10-CM | POA: Diagnosis not present

## 2016-06-22 NOTE — Progress Notes (Signed)
ERROR

## 2016-06-22 NOTE — Progress Notes (Addendum)
Location:   [facility] Room Number: 112 B Place of Service:  SNF (31)   CODE STATUS: Full Code  Allergies  Allergen Reactions  . Aspirin Hives  . Penicillins Hives    Chief Complaint  Patient presents with  . Medical Management of Chronic Issues    1 month follow up    HPI:  She is a long term resident of this facility being seen for the management of her chronic illnesses. She is stable overall. She does get out of bed daily. She denies any pain today; but is hungry. There are no nursing concerns at this time.    Past Medical History:  Diagnosis Date  . Anemia   . Arthritis   . Chronic kidney disease   . Diabetes mellitus without complication ([place])   . Hemorrhoid   . Hypertension   . Stroke ([place])   . Thyroid disease     Past Surgical History:  Procedure Laterality Date  . ABDOMINAL HYSTERECTOMY     PARTIAL  . BREAST LUMPECTOMY     LEFT  . MASTECTOMY  05/2008   Left    Social History   Social History  . Marital status: Single    Spouse name: N/A  . Number of children: N/A  . Years of education: N/A   Occupational History  . Not on file.   Social History Main Topics  . Smoking status: Never Smoker  . Smokeless tobacco: Never Used  . Alcohol use No  . Drug use: No  . Sexual activity: Not on file   Other Topics Concern  . Not on file   Social History Narrative  . No narrative on file   History reviewed. No pertinent family history.    VITAL SIGNS BP 138/62   Pulse 78   Temp 99.6 F (37.6 C)   Resp 18   Ht 5\' 1"  (1.549 m)   Wt 134 lb 10 oz (61.1 kg)   SpO2 90%   BMI 25.44 kg/m   Patient's Medications  New Prescriptions   No medications on file  Previous Medications   AMINO ACIDS-PROTEIN HYDROLYS (FEEDING SUPPLEMENT, PRO-STAT SUGAR FREE 64,) LIQD    Take 30 mLs by mouth 3 (three) times daily with meals.   AMLODIPINE (NORVASC) 10 MG TABLET    Take 10 mg by mouth daily.   ASCORBIC ACID (VITAMIN C) 1000 MG TABLET     Take 1,000 mg by mouth daily.   CHOLECALCIFEROL (VITAMIN D) 2000 UNITS TABLET    Take 2,000 Units by mouth daily.   CLOPIDOGREL (PLAVIX) 75 MG TABLET    Take 1 tablet (75 mg total) by mouth daily.   DONEPEZIL (ARICEPT) 5 MG TABLET    Take 5 mg by mouth at bedtime.    FERROUS SULFATE 325 (65 FE) MG TABLET    Take 325 mg by mouth 2 (two) times daily with a meal.   HYDROCODONE-ACETAMINOPHEN (NORCO) 10-325 MG TABLET    Take 1 tablet by mouth 2 (two) times daily.   LEVOTHYROXINE (SYNTHROID, LEVOTHROID) 137 MCG TABLET    Take 137 mcg by mouth daily before breakfast.   LISINOPRIL (PRINIVIL,ZESTRIL) 40 MG TABLET    Take 40 mg by mouth daily.   LORAZEPAM (ATIVAN) 0.5 MG TABLET    Give one tablet 1 time daily, give 1 tablet by mouth as needed two times daily   METOPROLOL TARTRATE (LOPRESSOR) 25 MG TABLET    Take 12.5 mg by mouth 2 (two) times  daily.   MULTIPLE VITAMINS-MINERALS (ELDERTONIC PO)    Give 15 cc by mouth two times daily for weight loss   NUTRITIONAL SUPPLEMENTS (NUTRITIONAL SUPPLEMENT PO)    Take 120 mLs by mouth 4 (four) times daily. Puree texture, regular consistency   PAROXETINE (PAXIL) 40 MG TABLET    Take 40 mg by mouth daily.   ROPINIROLE (REQUIP) 3 MG TABLET    Take 3 mg by mouth at bedtime.    UNABLE TO FIND    House Supplement - [place] Cup every day with lunch and dinner   VITAMIN B-12 1000 MCG TABLET    Take 1 tablet (1,000 mcg total) by mouth daily.   WOUND DRESSINGS GEL    TheraHoney Gel - Apply to Right Heel topically daily ever 2 days for wound care.  Mix with collagen particle 1 gram  Modified Medications   No medications on file  Discontinued Medications   SILVER HYDROGEL EX    Apply to left heel topically every day shift for wound care     SIGNIFICANT DIAGNOSTIC EXAMS  03-25-15: chest x-ray: no acute cardiopulmonary disease; no pneumonia   04-17-16: ABI: Right ABI: 0.86; Left ABI: compatible with significant arterial wall calcification.    LABS REVIEWED:   08-31-15: wbc  9.7; hgb 11.1; hct 35.2; mcv 78.6; plt 275; glucose 210; bun 31; creat 1.57; k+ 4.6; na++ 142 09-04-15: urine culture: 1,000 colonies  e-coli  09-18-15: tsh 97.79; free T3: 1.5; free T4: 0.6  10-25-15:tsh 36.56: free T4: 0.8; free T3: 1.8  12-17-15: tsh 7.45; free T3: 2.4; free T4: 1.19  02-24-16: wbc 6.3; hgb 9.9; hct 33.4; mcv 83.3 plt 196; glucose 82; bun 18.0; creat 0.99; k+ 4.7 ;na++ 142; liver normal albumin 2.9 chol 118; ldl 54; trig 141; hdl 36; hgb a1c 5.3; iron 47; tibc 113; ferritin 1214.00    Review of Systems  Unable to perform ROS: Other      Physical Exam  Constitutional: No distress.  Eyes: Conjunctivae are normal.  Neck: Neck supple. No JVD present. No thyromegaly present.  Cardiovascular: Normal rate, regular rhythm and intact distal pulses. Pedal pulses faint  Respiratory: Effort normal and breath sounds normal. No respiratory distress. She has no wheezes. Status post left mastectomy   GI: Soft. Bowel sounds are normal. She exhibits no distension. There is no tenderness.  Musculoskeletal: She exhibits no edema.  Left hemiparesis    Lymphadenopathy:    She has no cervical adenopathy.  Neurological: She is alert.  Skin: Skin is warm and dry. She is not diaphoretic.  Left heel: stage III:  0.7 x 0.5 cm 100% granulation  Psychiatric: She has a normal mood and affect.     ASSESSMENT/ PLAN:  1. CVA: with left hemiparesis: is neurologically stable; will continue plavix 75 mg daily takes vicodin 10/325 mg twice daily for pain management and will monitor   2. Hypothyroidism: will continue synthroid 137 mcg daily and will monitor her free t4: 1.19; tsh 7.45  3. Anemia: hgb 9.9 is presently stable will continue iron twice daily; vit b12 1000 mcg daily  iron level is up to 47 from  13; will monitor  4. Diabetes:  hgb a1c 5.3; is presently not on medications;  Is on ace and ldl is 54; will not make changes will monitor her status.   5. Hypertension: b/p 138/62   will  continue  lopressor 12.5 mg twice daily  lisinopril  40 mg daily  norvasc  10 mg daily  6. Restless leg syndrome: will continue requip  3 mg nightly   7.  Depression with anxiety: will continue paxil 40 mg daily   8. ckd stage III:  Her bun/creat is 18/0.99; will monitor   9. Hypokalemia:  K+ is 4.7 is not on supplement at this time will monitor   10. Left breast cancer T1N1 ER/PR +, HER 2 negative, 2 sentinel nodes involved:  post left mastectomy with 3 node axillary evaluation and 5 years of Femara thru 06-2013.  Will monitor   11. Dysphagia: no signs of aspiration present; will continue nectar thick liquids.   12. Dementia: will continue aricept 5 mg nightly  Her weight is 134 pounds.   13. Protein calorie malnutrition: weight loss: current weight is 134 pounds her weight in Feb 2018: 165 pounds.  her albumin is 2.9 will continue supplement per facility protocol and eldertonic 15 cc prior to lunch and supper.    14. Left  heel ulceration stage III: will continue current treatment plan. Is followed by wound doctor  Will get urine for micro-albumin    MD is aware of resident's narcotic use and is in agreement with current plan of care. We will attempt to wean resident as apropriate     At 1330: reported to me 02 sat in low 80's. No signs of respiratory distress present. Lung sounds diminished with few scattered rhonchi present. Will begin 02 at 2L/min. Will get chest x-ray cbc; cmp; will give stat duoneb and will continue to monitor her status and will treat as indicated.     Ok Edwards NP [facility]  Contact [phone] Monday through Friday 8am- 5pm  After hours call [phone]

## 2016-06-22 NOTE — Progress Notes (Signed)
Subjective:   [name] is a 83 y.o. female who presents for an Initial Medicare Annual Wellness Visit at [facility]     Objective:    Today's Vitals   06/22/16 1348  BP: 130/60  Pulse: (!) 58  Temp: 98.4 F (36.9 C)  TempSrc: Oral  SpO2: (!) 81%  Weight: 135 lb (61.2 kg)  Height: 5\' 1"  (1.549 m)   Body mass index is 25.51 kg/m.   Current Medications (verified) Outpatient Encounter Prescriptions as of 06/22/2016  Medication Sig  . Amino Acids-Protein Hydrolys (FEEDING SUPPLEMENT, PRO-STAT SUGAR FREE 64,) LIQD Take 30 mLs by mouth 3 (three) times daily with meals.  Marland Kitchen amLODipine (NORVASC) 10 MG tablet Take 10 mg by mouth daily.  . Ascorbic Acid (VITAMIN C) 1000 MG tablet Take 1,000 mg by mouth daily.  . Cholecalciferol (VITAMIN D) 2000 UNITS tablet Take 2,000 Units by mouth daily.  . clopidogrel (PLAVIX) 75 MG tablet Take 1 tablet (75 mg total) by mouth daily.  Marland Kitchen donepezil (ARICEPT) 5 MG tablet Take 5 mg by mouth at bedtime.   . ferrous sulfate 325 (65 FE) MG tablet Take 325 mg by mouth 2 (two) times daily with a meal.  . HYDROcodone-acetaminophen (NORCO) 10-325 MG tablet Take 1 tablet by mouth 2 (two) times daily.  Marland Kitchen levothyroxine (SYNTHROID, LEVOTHROID) 137 MCG tablet Take 137 mcg by mouth daily before breakfast.  . lisinopril (PRINIVIL,ZESTRIL) 40 MG tablet Take 40 mg by mouth daily.  Marland Kitchen LORazepam (ATIVAN) 0.5 MG tablet Give one tablet 1 time daily, give 1 tablet by mouth as needed two times daily  . metoprolol tartrate (LOPRESSOR) 25 MG tablet Take 12.5 mg by mouth 2 (two) times daily.  . Multiple Vitamins-Minerals (ELDERTONIC PO) Give 15 cc by mouth two times daily for weight loss  . Nutritional Supplements (NUTRITIONAL SUPPLEMENT PO) Take 120 mLs by mouth 4 (four) times daily. Puree texture, regular consistency  . PARoxetine (PAXIL) 40 MG tablet Take 40 mg by mouth daily.  Marland Kitchen rOPINIRole (REQUIP) 3 MG tablet Take 3 mg by mouth at bedtime.   Marland Kitchen UNABLE TO  [place] Cup every day with lunch and dinner  . vitamin B-12 1000 MCG tablet Take 1 tablet (1,000 mcg total) by mouth daily.  Marland Kitchen wound dressings gel TheraHoney Gel - Apply to Right Heel topically daily ever 2 days for wound care.  Mix with collagen particle 1 gram   No facility-administered encounter medications on file as of 06/22/2016.     Allergies (verified) Aspirin and Penicillins   History: Past Medical History:  Diagnosis Date  . Anemia   . Arthritis   . Chronic kidney disease   . Diabetes mellitus without complication ([place])   . Hemorrhoid   . Hypertension   . Stroke ([place])   . Thyroid disease    Past Surgical History:  Procedure Laterality Date  . ABDOMINAL HYSTERECTOMY     PARTIAL  . BREAST LUMPECTOMY     LEFT  . MASTECTOMY  05/2008   Left   History reviewed. No pertinent family history. Social History   Occupational History  . Not on file.   Social History Main Topics  . Smoking status: Never Smoker  . Smokeless tobacco: Never Used  . Alcohol use No  . Drug use: No  . Sexual activity: Not on file    Tobacco Counseling Counseling given: Not Answered   Activities of Daily Living In your present state of health,  do you have any difficulty performing the following activities: 06/22/2016  Hearing? N  Vision? N  Difficulty concentrating or making decisions? Y  Walking or climbing stairs? Y  Dressing or bathing? Y  Doing errands, shopping? Y  Preparing Food and eating ? Y  Using the Toilet? Y  In the past six months, have you accidently leaked urine? Y  Do you have problems with loss of bowel control? Y  Managing your Medications? Y  Managing your Finances? Y  Housekeeping or managing your Housekeeping? Y  Some recent data might be hidden    Immunizations and Health Maintenance Immunization History  Administered Date(s) Administered  . Influenza,inj,Quad PF,36+ Mos 10/07/2012, 10/13/2013  . Influenza-Unspecified 10/12/2014,  11/21/2015  . Pneumococcal Conjugate-13 10/14/2010   There are no preventive care reminders to display for this patient.  Patient Care Team: Gildardo Cranker, DO as PCP - General (Internal Medicine) Kennith Center, LCSW as Social Worker Nyoka Cowden, Phylis Bougie, NP as Nurse Practitioner ([place]) Center, [place] ([place])  Indicate any recent [place] you may have received from other than [facility] providers in the past year (date may be approximate).     Assessment:   This is a routine wellness examination for [name].   Hearing/Vision screen No exam data present  Dietary issues and exercise activities discussed: Current Exercise Habits: The patient does not participate in regular exercise at present, Exercise limited by: None identified  Goals    None     Depression Screen PHQ 2/9 Scores 06/22/2016  PHQ - 2 Score 0    Fall Risk Fall Risk  06/22/2016 11/08/2013  Falls in the past year? Yes No  Number falls in past yr: 1 -  Injury with Fall? No -  Risk for fall due to : - Impaired mobility  Risk for fall due to (comments): - Fall risk band on wrist    Cognitive Function:     6CIT Screen 06/22/2016  What Year? 4 points  What month? 3 points  What time? 3 points  Count back from 20 4 points  Months in reverse 4 points  Repeat phrase 10 points  Total Score 28    Screening Tests Health Maintenance  Topic Date Due  . OPHTHALMOLOGY EXAM  05/21/2017 (Originally 04/21/2016)  . FOOT EXAM  07/02/2016  . INFLUENZA VACCINE  08/19/2016  . HEMOGLOBIN A1C  08/23/2016  . TETANUS/TDAP  08/27/2022  . DEXA SCAN  Completed  . PNA vac Low Risk Adult  Completed      Plan:  I have personally reviewed and addressed the Medicare Annual Wellness questionnaire and have noted the following in the patient's chart:  A. Medical and social history B. Use of alcohol, tobacco or illicit drugs  C. Current medications and supplements D. Functional  ability and status E.  Nutritional status F.  Physical activity G. Advance directives H. List of other physicians I.  Hospitalizations, surgeries, and ER visits in previous 12 months J.  [place] to include hearing, vision, cognitive, depression L. Referrals and appointments - none  In addition, I have reviewed and discussed with patient certain preventive protocols, quality metrics, and best practice recommendations. A written personalized care plan for preventive services as well as general preventive health recommendations were provided to patient.  See attached scanned questionnaire for additional information.   Signed,   Rich Reining, RN Nurse Health Advisor   Quick Notes   Health Maintenance: Eye exam due. PNA 23 due.  Abnormal Screen: 6 CIT-28     Patient Concerns: None      Nurse Concerns: O2 83, notified NP

## 2016-06-22 NOTE — Patient Instructions (Signed)
[name] , Thank you for taking time to come for your Medicare Wellness Visit. I appreciate your ongoing commitment to your health goals. Please review the following plan we discussed and let me know if I can assist you in the future.   Screening recommendations/referrals: Colonoscopy- pt long term Mammogram- pt long term Bone Density- up to date Recommended yearly ophthalmology/optometry visit for glaucoma screening and checkup Recommended yearly dental visit for hygiene and checkup  Vaccinations: Influenza vaccine due 11/20/2016 Pneumococcal vaccine 23 due Tdap vaccine not in records Shingles vaccine not in records  Advanced directives: In Chart  Conditions/risks identified: O2 81%  Next appointment: None upcoming   Preventive Care 65 Years and Older, Female Preventive care refers to lifestyle choices and visits with your health care provider that can promote health and wellness. What does preventive care include?  A yearly physical exam. This is also called an annual well check.  Dental exams once or twice a year.  Routine eye exams. Ask your health care provider how often you should have your eyes checked.  Personal lifestyle choices, including:  Daily care of your teeth and gums.  Regular physical activity.  Eating a healthy diet.  Avoiding tobacco and drug use.  Limiting alcohol use.  Practicing safe sex.  Taking low-dose aspirin every day.  Taking vitamin and mineral supplements as recommended by your health care provider. What happens during an annual well check? The services and screenings done by your health care provider during your annual well check will depend on your age, overall health, lifestyle risk factors, and family history of disease. Counseling  Your health care provider may ask you questions about your:  Alcohol use.  Tobacco use.  Drug use.  Emotional well-being.  Home and relationship well-being.  Sexual activity.  Eating  habits.  History of falls.  Memory and ability to understand (cognition).  Work and work Statistician.  Reproductive health. Screening  You may have the following tests or measurements:  Height, weight, and BMI.  Blood pressure.  Lipid and cholesterol levels. These may be checked every 5 years, or more frequently if you are over 50 years old.  Skin check.  Lung cancer screening. You may have this screening every year starting at age 67 if you have a 30-pack-year history of smoking and currently smoke or have quit within the past 15 years.  Fecal occult blood test (FOBT) of the stool. You may have this test every year starting at age 76.  Flexible sigmoidoscopy or colonoscopy. You may have a sigmoidoscopy every 5 years or a colonoscopy every 10 years starting at age 29.  Hepatitis C blood test.  Hepatitis B blood test.  Sexually transmitted disease (STD) testing.  Diabetes screening. This is done by checking your blood sugar (glucose) after you have not eaten for a while (fasting). You may have this done every 1-3 years.  Bone density scan. This is done to screen for osteoporosis. You may have this done starting at age 21.  Mammogram. This may be done every 1-2 years. Talk to your health care provider about how often you should have regular mammograms. Talk with your health care provider about your test results, treatment options, and if necessary, the need for more tests. Vaccines  Your health care provider may recommend certain vaccines, such as:  Influenza vaccine. This is recommended every year.  Tetanus, diphtheria, and acellular pertussis (Tdap, Td) vaccine. You may need a Td booster every 10 years.  Zoster vaccine. You  may need this after age 48.  Pneumococcal 13-valent conjugate (PCV13) vaccine. One dose is recommended after age 42.  Pneumococcal polysaccharide (PPSV23) vaccine. One dose is recommended after age 11. Talk to your health care provider about which  screenings and vaccines you need and how often you need them. This information is not intended to replace advice given to you by your health care provider. Make sure you discuss any questions you have with your health care provider. Document Released: 02/01/2015 Document Revised: 09/25/2015 Document Reviewed: 11/06/2014 Elsevier Interactive Patient Education  2017 [place] Prevention in the Home Falls can cause injuries. They can happen to people of all ages. There are many things you can do to make your home safe and to help prevent falls. What can I do on the outside of my home?  Regularly fix the edges of walkways and driveways and fix any cracks.  Remove anything that might make you trip as you walk through a door, such as a raised step or threshold.  Trim any bushes or trees on the path to your home.  Use bright outdoor lighting.  Clear any walking paths of anything that might make someone trip, such as rocks or tools.  Regularly check to see if handrails are loose or broken. Make sure that both sides of any steps have handrails.  Any raised decks and porches should have guardrails on the edges.  Have any leaves, snow, or ice cleared regularly.  Use sand or salt on walking paths during winter.  Clean up any spills in your garage right away. This includes oil or grease spills. What can I do in the bathroom?  Use night lights.  Install grab bars by the toilet and in the tub and shower. Do not use towel bars as grab bars.  Use non-skid mats or decals in the tub or shower.  If you need to sit down in the shower, use a plastic, non-slip stool.  Keep the floor dry. Clean up any water that spills on the floor as soon as it happens.  Remove soap buildup in the tub or shower regularly.  Attach bath mats securely with double-sided non-slip rug tape.  Do not have throw rugs and other things on the floor that can make you trip. What can I do in the bedroom?  Use  night lights.  Make sure that you have a light by your bed that is easy to reach.  Do not use any sheets or blankets that are too big for your bed. They should not hang down onto the floor.  Have a firm chair that has side arms. You can use this for support while you get dressed.  Do not have throw rugs and other things on the floor that can make you trip. What can I do in the kitchen?  Clean up any spills right away.  Avoid walking on wet floors.  Keep items that you use a lot in easy-to-reach places.  If you need to reach something above you, use a strong step stool that has a grab bar.  Keep electrical cords out of the way.  Do not use floor polish or wax that makes floors slippery. If you must use wax, use non-skid floor wax.  Do not have throw rugs and other things on the floor that can make you trip. What can I do with my stairs?  Do not leave any items on the stairs.  Make sure that there are handrails on both  sides of the stairs and use them. Fix handrails that are broken or loose. Make sure that handrails are as long as the stairways.  Check any carpeting to make sure that it is firmly attached to the stairs. Fix any carpet that is loose or worn.  Avoid having throw rugs at the top or bottom of the stairs. If you do have throw rugs, attach them to the floor with carpet tape.  Make sure that you have a light switch at the top of the stairs and the bottom of the stairs. If you do not have them, ask someone to add them for you. What else can I do to help prevent falls?  Wear shoes that:  Do not have high heels.  Have rubber bottoms.  Are comfortable and fit you well.  Are closed at the toe. Do not wear sandals.  If you use a stepladder:  Make sure that it is fully opened. Do not climb a closed stepladder.  Make sure that both sides of the stepladder are locked into place.  Ask someone to hold it for you, if possible.  Clearly mark and make sure that you  can see:  Any grab bars or handrails.  First and last steps.  Where the edge of each step is.  Use tools that help you move around (mobility aids) if they are needed. These include:  Canes.  Walkers.  Scooters.  Crutches.  Turn on the lights when you go into a dark area. Replace any light bulbs as soon as they burn out.  Set up your furniture so you have a clear path. Avoid moving your furniture around.  If any of your floors are uneven, fix them.  If there are any pets around you, be aware of where they are.  Review your medicines with your doctor. Some medicines can make you feel dizzy. This can increase your chance of falling. Ask your doctor what other things that you can do to help prevent falls. This information is not intended to replace advice given to you by your health care provider. Make sure you discuss any questions you have with your health care provider. Document Released: 11/01/2008 Document Revised: 06/13/2015 Document Reviewed: 02/09/2014 Elsevier Interactive Patient Education  2017 [employer].

## 2016-06-23 DIAGNOSIS — I2699 Other pulmonary embolism without acute cor pulmonale: Secondary | ICD-10-CM | POA: Diagnosis not present

## 2016-06-23 DIAGNOSIS — E1122 Type 2 diabetes mellitus with diabetic chronic kidney disease: Secondary | ICD-10-CM | POA: Diagnosis not present

## 2016-06-23 DIAGNOSIS — I69859 Hemiplegia and hemiparesis following other cerebrovascular disease affecting unspecified side: Secondary | ICD-10-CM | POA: Diagnosis not present

## 2016-06-23 DIAGNOSIS — L89623 Pressure ulcer of left heel, stage 3: Secondary | ICD-10-CM | POA: Diagnosis not present

## 2016-06-23 DIAGNOSIS — M6281 Muscle weakness (generalized): Secondary | ICD-10-CM | POA: Diagnosis not present

## 2016-06-23 DIAGNOSIS — I639 Cerebral infarction, unspecified: Secondary | ICD-10-CM | POA: Diagnosis not present

## 2016-06-23 DIAGNOSIS — F0281 Dementia in other diseases classified elsewhere with behavioral disturbance: Secondary | ICD-10-CM | POA: Diagnosis not present

## 2016-06-23 LAB — HEPATIC FUNCTION PANEL
ALK PHOS: 76 (ref 25–125)
ALT: 8 (ref 7–35)
AST: 18 (ref 13–35)
Bilirubin, Total: 0.4

## 2016-06-23 LAB — MICROALBUMIN, URINE

## 2016-06-23 LAB — BASIC METABOLIC PANEL
BUN: 20 (ref 4–21)
CREATININE: 1.1 (ref 0.5–1.1)
Glucose: 70
POTASSIUM: 3.6 (ref 3.4–5.3)
SODIUM: 141 (ref 137–147)

## 2016-06-23 LAB — CBC AND DIFFERENTIAL
HCT: 33 — AB (ref 36–46)
Hemoglobin: 10.4 — AB (ref 12.0–16.0)
Neutrophils Absolute: 3
PLATELETS: 182 (ref 150–399)
WBC: 5

## 2016-06-24 DIAGNOSIS — F0281 Dementia in other diseases classified elsewhere with behavioral disturbance: Secondary | ICD-10-CM | POA: Diagnosis not present

## 2016-06-24 DIAGNOSIS — I639 Cerebral infarction, unspecified: Secondary | ICD-10-CM | POA: Diagnosis not present

## 2016-06-24 DIAGNOSIS — I69859 Hemiplegia and hemiparesis following other cerebrovascular disease affecting unspecified side: Secondary | ICD-10-CM | POA: Diagnosis not present

## 2016-06-24 DIAGNOSIS — M6281 Muscle weakness (generalized): Secondary | ICD-10-CM | POA: Diagnosis not present

## 2016-06-24 DIAGNOSIS — I2699 Other pulmonary embolism without acute cor pulmonale: Secondary | ICD-10-CM | POA: Diagnosis not present

## 2016-06-25 ENCOUNTER — Encounter (HOSPITAL_COMMUNITY): Payer: Self-pay | Admitting: Vascular Surgery

## 2016-06-25 ENCOUNTER — Emergency Department (HOSPITAL_COMMUNITY): Payer: Medicare Other

## 2016-06-25 ENCOUNTER — Inpatient Hospital Stay (HOSPITAL_COMMUNITY)
Admission: EM | Admit: 2016-06-25 | Discharge: 2016-06-29 | DRG: 175 | Disposition: A | Discharge status: Skilled Nursing Facility | Payer: Medicare Other | Attending: Family Medicine | Admitting: Family Medicine

## 2016-06-25 ENCOUNTER — Encounter: Payer: Self-pay | Admitting: Adult Health

## 2016-06-25 DIAGNOSIS — Z880 Allergy status to penicillin: Secondary | ICD-10-CM

## 2016-06-25 DIAGNOSIS — I129 Hypertensive chronic kidney disease with stage 1 through stage 4 chronic kidney disease, or unspecified chronic kidney disease: Secondary | ICD-10-CM | POA: Diagnosis present

## 2016-06-25 DIAGNOSIS — J189 Pneumonia, unspecified organism: Secondary | ICD-10-CM | POA: Diagnosis not present

## 2016-06-25 DIAGNOSIS — J690 Pneumonitis due to inhalation of food and vomit: Secondary | ICD-10-CM | POA: Diagnosis present

## 2016-06-25 DIAGNOSIS — Z8673 Personal history of transient ischemic attack (TIA), and cerebral infarction without residual deficits: Secondary | ICD-10-CM

## 2016-06-25 DIAGNOSIS — E1129 Type 2 diabetes mellitus with other diabetic kidney complication: Secondary | ICD-10-CM | POA: Diagnosis present

## 2016-06-25 DIAGNOSIS — F0390 Unspecified dementia without behavioral disturbance: Secondary | ICD-10-CM | POA: Diagnosis present

## 2016-06-25 DIAGNOSIS — I10 Essential (primary) hypertension: Secondary | ICD-10-CM | POA: Diagnosis present

## 2016-06-25 DIAGNOSIS — Z853 Personal history of malignant neoplasm of breast: Secondary | ICD-10-CM

## 2016-06-25 DIAGNOSIS — E43 Unspecified severe protein-calorie malnutrition: Secondary | ICD-10-CM | POA: Diagnosis present

## 2016-06-25 DIAGNOSIS — Z7902 Long term (current) use of antithrombotics/antiplatelets: Secondary | ICD-10-CM

## 2016-06-25 DIAGNOSIS — R05 Cough: Secondary | ICD-10-CM | POA: Diagnosis not present

## 2016-06-25 DIAGNOSIS — E034 Atrophy of thyroid (acquired): Secondary | ICD-10-CM | POA: Diagnosis not present

## 2016-06-25 DIAGNOSIS — F0150 Vascular dementia without behavioral disturbance: Secondary | ICD-10-CM | POA: Diagnosis not present

## 2016-06-25 DIAGNOSIS — F339 Major depressive disorder, recurrent, unspecified: Secondary | ICD-10-CM | POA: Diagnosis not present

## 2016-06-25 DIAGNOSIS — R0902 Hypoxemia: Secondary | ICD-10-CM | POA: Diagnosis not present

## 2016-06-25 DIAGNOSIS — I5030 Unspecified diastolic (congestive) heart failure: Secondary | ICD-10-CM | POA: Diagnosis not present

## 2016-06-25 DIAGNOSIS — F419 Anxiety disorder, unspecified: Secondary | ICD-10-CM | POA: Diagnosis not present

## 2016-06-25 DIAGNOSIS — I2699 Other pulmonary embolism without acute cor pulmonale: Principal | ICD-10-CM | POA: Diagnosis present

## 2016-06-25 DIAGNOSIS — Z79899 Other long term (current) drug therapy: Secondary | ICD-10-CM | POA: Diagnosis not present

## 2016-06-25 DIAGNOSIS — F0281 Dementia in other diseases classified elsewhere with behavioral disturbance: Secondary | ICD-10-CM | POA: Diagnosis not present

## 2016-06-25 DIAGNOSIS — N179 Acute kidney failure, unspecified: Secondary | ICD-10-CM | POA: Diagnosis present

## 2016-06-25 DIAGNOSIS — E1122 Type 2 diabetes mellitus with diabetic chronic kidney disease: Secondary | ICD-10-CM | POA: Diagnosis present

## 2016-06-25 DIAGNOSIS — E039 Hypothyroidism, unspecified: Secondary | ICD-10-CM | POA: Diagnosis not present

## 2016-06-25 DIAGNOSIS — L89623 Pressure ulcer of left heel, stage 3: Secondary | ICD-10-CM | POA: Diagnosis present

## 2016-06-25 DIAGNOSIS — D631 Anemia in chronic kidney disease: Secondary | ICD-10-CM | POA: Diagnosis not present

## 2016-06-25 DIAGNOSIS — L89620 Pressure ulcer of left heel, unstageable: Secondary | ICD-10-CM | POA: Diagnosis present

## 2016-06-25 DIAGNOSIS — E440 Moderate protein-calorie malnutrition: Secondary | ICD-10-CM | POA: Diagnosis present

## 2016-06-25 DIAGNOSIS — R402441 Other coma, without documented Glasgow coma scale score, or with partial score reported, in the field [EMT or ambulance]: Secondary | ICD-10-CM | POA: Diagnosis not present

## 2016-06-25 DIAGNOSIS — E876 Hypokalemia: Secondary | ICD-10-CM | POA: Diagnosis present

## 2016-06-25 DIAGNOSIS — R1312 Dysphagia, oropharyngeal phase: Secondary | ICD-10-CM | POA: Diagnosis not present

## 2016-06-25 DIAGNOSIS — Z886 Allergy status to analgesic agent status: Secondary | ICD-10-CM

## 2016-06-25 DIAGNOSIS — N183 Chronic kidney disease, stage 3 unspecified: Secondary | ICD-10-CM | POA: Diagnosis present

## 2016-06-25 DIAGNOSIS — G2581 Restless legs syndrome: Secondary | ICD-10-CM | POA: Diagnosis not present

## 2016-06-25 DIAGNOSIS — E113553 Type 2 diabetes mellitus with stable proliferative diabetic retinopathy, bilateral: Secondary | ICD-10-CM | POA: Diagnosis not present

## 2016-06-25 DIAGNOSIS — N390 Urinary tract infection, site not specified: Secondary | ICD-10-CM | POA: Diagnosis not present

## 2016-06-25 DIAGNOSIS — I69859 Hemiplegia and hemiparesis following other cerebrovascular disease affecting unspecified side: Secondary | ICD-10-CM | POA: Diagnosis not present

## 2016-06-25 DIAGNOSIS — E162 Hypoglycemia, unspecified: Secondary | ICD-10-CM | POA: Diagnosis not present

## 2016-06-25 DIAGNOSIS — N189 Chronic kidney disease, unspecified: Secondary | ICD-10-CM

## 2016-06-25 DIAGNOSIS — R293 Abnormal posture: Secondary | ICD-10-CM | POA: Diagnosis not present

## 2016-06-25 DIAGNOSIS — R41841 Cognitive communication deficit: Secondary | ICD-10-CM | POA: Diagnosis not present

## 2016-06-25 DIAGNOSIS — M6281 Muscle weakness (generalized): Secondary | ICD-10-CM | POA: Diagnosis not present

## 2016-06-25 DIAGNOSIS — R4182 Altered mental status, unspecified: Secondary | ICD-10-CM | POA: Diagnosis not present

## 2016-06-25 DIAGNOSIS — I639 Cerebral infarction, unspecified: Secondary | ICD-10-CM | POA: Diagnosis not present

## 2016-06-25 DIAGNOSIS — E46 Unspecified protein-calorie malnutrition: Secondary | ICD-10-CM | POA: Diagnosis not present

## 2016-06-25 LAB — CBC WITH DIFFERENTIAL/PLATELET
BASOS ABS: 0 10*3/uL (ref 0.0–0.1)
BASOS PCT: 0 %
Eosinophils Absolute: 0.1 10*3/uL (ref 0.0–0.7)
Eosinophils Relative: 2 %
HEMATOCRIT: 31.5 % — AB (ref 36.0–46.0)
Hemoglobin: 9.7 g/dL — ABNORMAL LOW (ref 12.0–15.0)
LYMPHS PCT: 24 %
Lymphs Abs: 1.6 10*3/uL (ref 0.7–4.0)
MCH: 23.4 pg — ABNORMAL LOW (ref 26.0–34.0)
MCHC: 30.8 g/dL (ref 30.0–36.0)
MCV: 75.9 fL — AB (ref 78.0–100.0)
MONO ABS: 0.4 10*3/uL (ref 0.1–1.0)
Monocytes Relative: 5 %
NEUTROS ABS: 4.6 10*3/uL (ref 1.7–7.7)
NEUTROS PCT: 69 %
Platelets: 303 10*3/uL (ref 150–400)
RBC: 4.15 MIL/uL (ref 3.87–5.11)
RDW: 14.6 % (ref 11.5–15.5)
WBC: 6.7 10*3/uL (ref 4.0–10.5)

## 2016-06-25 LAB — BASIC METABOLIC PANEL
ANION GAP: 9 (ref 5–15)
BUN: 21 mg/dL — ABNORMAL HIGH (ref 6–20)
CALCIUM: 8.2 mg/dL — AB (ref 8.9–10.3)
CO2: 28 mmol/L (ref 22–32)
Chloride: 107 mmol/L (ref 101–111)
Creatinine, Ser: 1.4 mg/dL — ABNORMAL HIGH (ref 0.44–1.00)
GFR calc Af Amer: 39 mL/min — ABNORMAL LOW (ref >60)
GFR calc non Af Amer: 34 mL/min — ABNORMAL LOW (ref >60)
GLUCOSE: 155 mg/dL — AB (ref 65–99)
POTASSIUM: 2.9 mmol/L — AB (ref 3.5–5.1)
Sodium: 144 mmol/L (ref 135–145)

## 2016-06-25 MED ORDER — POTASSIUM CHLORIDE 10 MEQ/100ML IV SOLN
10.0000 meq | INTRAVENOUS | Status: AC
  Administered 2016-06-25 (×3): 10 meq via INTRAVENOUS
  Filled 2016-06-25 (×3): qty 100

## 2016-06-25 MED ORDER — IOPAMIDOL (ISOVUE-300) INJECTION 61%
INTRAVENOUS | Status: AC
Start: 2016-06-25 — End: 2016-06-25
  Administered 2016-06-25: 75 mL
  Filled 2016-06-25: qty 75

## 2016-06-25 MED ORDER — SODIUM CHLORIDE 0.9 % IV BOLUS (SEPSIS)
500.0000 mL | Freq: Once | INTRAVENOUS | Status: AC
Start: 2016-06-25 — End: 2016-06-25
  Administered 2016-06-25: 500 mL via INTRAVENOUS

## 2016-06-25 MED ORDER — HEPARIN BOLUS VIA INFUSION
3000.0000 [IU] | Freq: Once | INTRAVENOUS | Status: AC
  Administered 2016-06-25: 3000 [IU] via INTRAVENOUS
  Filled 2016-06-25: qty 3000

## 2016-06-25 MED ORDER — HEPARIN (PORCINE) IN NACL 100-0.45 UNIT/ML-% IJ SOLN
950.0000 [IU]/h | INTRAMUSCULAR | Status: AC
  Administered 2016-06-25: 1000 [IU]/h via INTRAVENOUS
  Administered 2016-06-26: 1150 [IU]/h via INTRAVENOUS
  Administered 2016-06-27: 900 [IU]/h via INTRAVENOUS
  Filled 2016-06-25 (×3): qty 250

## 2016-06-25 MED ORDER — LEVOFLOXACIN IN D5W 750 MG/150ML IV SOLN
750.0000 mg | Freq: Once | INTRAVENOUS | Status: AC
  Administered 2016-06-25: 750 mg via INTRAVENOUS
  Filled 2016-06-25: qty 150

## 2016-06-25 NOTE — ED Triage Notes (Addendum)
Pt reports to the ED for eval of continued pulmonary congestion. Per EMS she is from [place]. She was diagnosed with PNA on Monday and had treatment started with Levaquin. The SNF called 911 to have the patient re-evaluated for continued pulmonary congestion. Pt does have some rhonchi and a productive cough. Pt was 99% on RA.

## 2016-06-25 NOTE — ED Provider Notes (Signed)
[facility] DEPT Provider Note   CSN: [id number] Arrival date & time: 06/25/16  1907     History   Chief Complaint Chief Complaint  Patient presents with  . Pneumonia    HPI Jheri Mitter is a 83 y.o. female.  Level V Caveat: Dementia.   Anshi Alleyne is a 83 y.o. Female who presents to the emergency room via EMS from her skilled nursing facility with complaint of continued cough. Patient apparently was recently diagnosed with pneumonia on Monday and started on an unknown antibiotic. She apparently has had worsening cough since then. She does have a history of breast cancer. She denies any complaints to me. She is unable to participate with history due to dementia. She is from [facility].    The history is provided by medical records, the patient and the nursing home. The history is limited by the absence of a caregiver.  Pneumonia     Past Medical History:  Diagnosis Date  . Anemia   . Arthritis   . Chronic kidney disease   . Diabetes mellitus without complication ([place])   . Hemorrhoid   . Hypertension   . Stroke ([place])   . Thyroid disease     Patient Active Problem List   Diagnosis Date Noted  . PE (pulmonary thromboembolism) ([place]) 06/25/2016  . Dysphagia, pharyngoesophageal phase 12/28/2015  . Hypothyroidism due to acquired atrophy of thyroid 08/07/2015  . Diabetes mellitus with renal manifestations, controlled ([place]) 12/13/2014  . Hemiparesis affecting left side as late effect of cerebrovascular accident ([place]) 12/13/2014  . Loss of weight 12/13/2014  . Anemia in chronic renal disease 11/10/2014  . Dementia without behavioral disturbance 09/24/2014  . Vitamin D deficiency 09/24/2014  . Decubitus ulcer of left heel, unstageable ([place]) 09/17/2014  . Hypokalemia 09/16/2014  . Cerebral thrombosis with cerebral infarction ([place]) 07/06/2014  . Constipation 03/29/2014  . CKD (chronic kidney disease), stage III 11/08/2013  . Breast cancer, left breast ([place])  10/07/2012  . Essential hypertension 08/29/2012  . Anemia 01/09/2011    Past Surgical History:  Procedure Laterality Date  . ABDOMINAL HYSTERECTOMY     PARTIAL  . BREAST LUMPECTOMY     LEFT  . MASTECTOMY  05/2008   Left    OB History    No data available       Home Medications    Prior to Admission medications   Medication Sig Start Date End Date Taking? Authorizing Provider  clopidogrel (PLAVIX) 75 MG tablet Take 1 tablet (75 mg total) by mouth daily. 07/09/14  Yes Vann, Jessica U, DO  donepezil (ARICEPT) 5 MG tablet Take 5 mg by mouth at bedtime.    Yes [provider]  ferrous sulfate 325 (65 FE) MG tablet Take 325 mg by mouth 2 (two) times daily with a meal.   Yes [provider]  levothyroxine (SYNTHROID, LEVOTHROID) 137 MCG tablet Take 137 mcg by mouth daily before breakfast.   Yes [provider]  moxifloxacin (AVELOX) 400 MG tablet Take 400 mg by mouth daily at 8 pm.   Yes [provider]  Multiple Vitamins-Minerals (ELDERTONIC PO) Give 15 cc by mouth two times daily for weight loss   Yes [provider]  rOPINIRole (REQUIP) 3 MG tablet Take 3 mg by mouth at bedtime.  10/20/10  Yes [provider]  saccharomyces boulardii (FLORASTOR) 250 MG capsule Take 250 mg by mouth 2 (two) times daily.   Yes [provider]  UNABLE TO [place] -  Magic Cup every day with lunch and dinner   Yes [provider]  vitamin B-12 1000 MCG tablet Take 1 tablet (1,000 mcg total) by mouth daily. 07/09/14  Yes Vann, Jessica U, DO  Amino Acids-Protein Hydrolys (FEEDING SUPPLEMENT, PRO-STAT SUGAR FREE 64,) LIQD Take 30 mLs by mouth 3 (three) times daily with meals.    [provider]  amLODipine (NORVASC) 10 MG tablet Take 10 mg by mouth daily.    [provider]  Ascorbic Acid (VITAMIN C) 1000 MG tablet Take 1,000 mg by mouth daily.    [provider]  Cholecalciferol (VITAMIN D) 2000 UNITS  tablet Take 2,000 Units by mouth daily.    [provider]  HYDROcodone-acetaminophen (NORCO) 10-325 MG tablet Take 1 tablet by mouth 2 (two) times daily. 06/16/16   Lauree Chandler, NP  lisinopril (PRINIVIL,ZESTRIL) 40 MG tablet Take 40 mg by mouth daily.    [provider]  LORazepam (ATIVAN) 0.5 MG tablet Give one tablet 1 time daily, give 1 tablet by mouth as needed two times daily    [provider]  metoprolol tartrate (LOPRESSOR) 25 MG tablet Take 12.5 mg by mouth 2 (two) times daily.    [provider]  Nutritional Supplements (NUTRITIONAL SUPPLEMENT PO) Take 120 mLs by mouth 4 (four) times daily. Puree texture, regular consistency    [provider]  PARoxetine (PAXIL) 40 MG tablet Take 40 mg by mouth daily.    [provider]  wound dressings gel TheraHoney Gel - Apply to Right Heel topically daily ever 2 days for wound care.  Mix with collagen particle 1 gram    [provider]    Family History No family history on file.  Social History Social History  Substance Use Topics  . Smoking status: Never Smoker  . Smokeless tobacco: Never Used  . Alcohol use No     Allergies   Aspirin and Penicillins   Review of Systems Review of Systems  Unable to perform ROS: Dementia     Physical Exam Updated Vital Signs BP 124/85   Pulse 65   Temp 98 F (36.7 C) (Oral)   Resp 17   SpO2 97%   Physical Exam  Constitutional: She appears well-developed and well-nourished. No distress.  Nontoxic appearing.  HENT:  Head: Normocephalic and atraumatic.  Mouth/Throat: Oropharynx is clear and moist.  Mucous membranes are moist.  Eyes: Conjunctivae are normal. Pupils are equal, round, and reactive to light. Right eye exhibits no discharge. Left eye exhibits no discharge.  Neck: Neck supple.  Cardiovascular: Normal rate, regular rhythm, normal heart sounds and intact distal pulses.  Exam reveals no gallop and no friction  rub.   No murmur heard. Heart rate is 76 on exam.  Pulmonary/Chest: Effort normal. No respiratory distress. She has no wheezes. She has no rales.  Diminished lung sounds bilaterally worse on left side. No increased work of breathing.   Abdominal: Soft. There is no tenderness.  Musculoskeletal: She exhibits no edema or tenderness.  No LE edema or TTP.   Lymphadenopathy:    She has no cervical adenopathy.  Neurological: She is alert. Coordination normal.  Skin: Skin is warm and dry. Capillary refill takes less than 2 seconds. No rash noted. She is not diaphoretic. No erythema. No pallor.  Psychiatric: She has a normal mood and affect. Her behavior is normal.  Nursing note and vitals reviewed.    ED Treatments / Results  Labs (all labs ordered are  listed, but only abnormal results are displayed) Labs Reviewed  CBC WITH DIFFERENTIAL/PLATELET - Abnormal; Notable for the following:       Result Value   Hemoglobin 9.7 (*)    HCT 31.5 (*)    MCV 75.9 (*)    MCH 23.4 (*)    All other components within normal limits  BASIC METABOLIC PANEL - Abnormal; Notable for the following:    Potassium 2.9 (*)    Glucose, Bld 155 (*)    BUN 21 (*)    Creatinine, Ser 1.40 (*)    Calcium 8.2 (*)    GFR calc non Af Amer 34 (*)    GFR calc Af Amer 39 (*)    All other components within normal limits  CULTURE, BLOOD (ROUTINE X 2)  CULTURE, BLOOD (ROUTINE X 2)  MAGNESIUM  HEPARIN LEVEL (UNFRACTIONATED)  CBC    EKG  EKG Interpretation None       Radiology Dg Chest 2 View  Result Date: 06/25/2016 CLINICAL DATA:  Cough. Pt reports to the ED for eval of continued pulmonary congestion. Pt seemed confused, unable to answer hx questions. She did say she's had a productive cough, but was unsure what color her phlegm was. Hx of DM, HTN, thyroid disease. EXAM: CHEST  2 VIEW COMPARISON:  02/26/2015 chest x-ray and chest CT FINDINGS: The heart is mildly enlarged. There is new left upper Lower atelectasis.  The right lung is clear. There is a small left pleural effusion. Surgical clips overlie the left lateral chest wall. IMPRESSION: Left upper lobar atelectasis. Recommend CT of the chest with contrast to exclude obstructing central pulmonary mass. Electronically Signed   By: Nolon Nations M.D.   On: 06/25/2016 20:27   Ct Chest W Contrast  Result Date: 06/25/2016 CLINICAL DATA:  The patient has a cough. CT recommended to exclude a central obstructing mass on the left based on chest x-ray. EXAM: CT CHEST WITH CONTRAST TECHNIQUE: Multidetector CT imaging of the chest was performed during intravenous contrast administration. CONTRAST:  75 mL of Isovue 370 COMPARISON:  Chest x-ray from earlier today and CT scan February 26, 2015 FINDINGS: Cardiovascular: The heart is unchanged. Coronary artery calcifications are noted. The thoracic aorta is non aneurysmal. No dissection. The study was not tailored to evaluate for pulmonary emboli. No emboli are seen in the main pulmonary arteries. A linear filling defect is seen in the periphery of a left lower lobe pulmonary artery as seen on axial image 66 and coronal image 68. No emboli seen on the right. The volume loss on the left results in mild shift of the mediastinum to the left. Mediastinum/Nodes: The esophagus is normal. No adenopathy identified. Moderate left pleural effusion is seen. No pericardial effusion or right pleural effusion. Lungs/Pleura: The trachea and mainstem bronchi are normal. Opacification of right lower lobe airways. There is significant atelectasis in the left upper lobe. No underlying cause is identified. No centrally obstructing masses noted. There is more mild atelectasis in the left base. There is a moderate left-sided pleural effusion. Dependent atelectasis seen on the right. No pneumothorax. No suspicious pulmonary nodule or mass is noted. Upper Abdomen: Renal cysts are identified. Increased enhancement in the hepatic dome as seen on coronal image  89, probably a hemangioma or perfusional variant. Another probable hemangioma is seen in the hepatic dome on image 100 as well. No other acute abnormalities in the upper abdomen. Musculoskeletal: No chest wall abnormality. No acute or significant osseous findings. IMPRESSION: 1. There  is a linear filling defect in the periphery of a left lower lobe pulmonary artery. This is consistent with an age indeterminate pulmonary embolus. While indeterminate, this may not be acute. No other emboli identified. Recommend clinical correlation. 2. Significant atelectasis in the left upper lobe resulting in volume loss. No underlying cause identified. No central obstructing mass is noted. The underlying lung is not well assessed. Recommend short-term follow-up to ensure resolution. 3. Opacification of right lower lobe bronchi may represent mucus or aspirated material. No infiltrate. 4. Moderate left-sided pleural effusion. 5. No other acute abnormalities. These results will be called to the ordering clinician or representative by the Radiologist Assistant, and communication documented in the PACS or zVision Dashboard. Electronically Signed   By: Dorise Bullion III M.D   On: 06/25/2016 22:00    Procedures Procedures (including critical care time)  CRITICAL CARE Performed by: Hanley Hays   Total critical care time: 45 minutes  Critical care time was exclusive of separately billable procedures and treating other patients.  Critical care was necessary to treat or prevent imminent or life-threatening deterioration.  Critical care was time spent personally by me on the following activities: development of treatment plan with patient and/or surrogate as well as nursing, discussions with consultants, evaluation of patient's response to treatment, examination of patient, obtaining history from patient or surrogate, ordering and performing treatments and interventions, ordering and review of laboratory studies,  ordering and review of radiographic studies, pulse oximetry and re-evaluation of patient's condition.    Medications Ordered in ED Medications  potassium chloride 10 mEq in 100 mL IVPB (10 mEq Intravenous New Bag/Given 06/25/16 2357)  levofloxacin (LEVAQUIN) IVPB 750 mg (750 mg Intravenous New Bag/Given 06/25/16 2358)  heparin ADULT infusion 100 units/mL (25000 units/263mL sodium chloride 0.45%) (1,000 Units/hr Intravenous New Bag/Given 06/25/16 2252)  sodium chloride 0.9 % bolus 500 mL (0 mLs Intravenous Stopped 06/25/16 2234)  iopamidol (ISOVUE-300) 61 % injection (75 mLs  Contrast Given 06/25/16 2128)  heparin bolus via infusion 3,000 Units (3,000 Units Intravenous Bolus from Bag 06/25/16 2254)     Initial Impression / Assessment and Plan / ED Course  I have reviewed the triage vital signs and the nursing notes.  Pertinent labs & imaging results that were available during my care of the patient were reviewed by me and considered in my medical decision making (see chart for details).    This is a 83 y.o. Female who presents to the emergency room via EMS from her skilled nursing facility with complaint of continued cough. Patient apparently was recently diagnosed with pneumonia on Monday and started on an unknown antibiotic. She apparently has had worsening cough since then. She does have a history of breast cancer. She denies any complaints to me. She is unable to participate with history due to dementia. She is from [facility]. I examined the patient is afebrile and nontoxic appearing. She is not tachycardic. She has some slight hypoxia which improves with 2 L via nasal cannula to 97%. She has diminished lung sounds bilaterally that is worse on her left side.  CBC shows a hemoglobin around her baseline. No cytosis. BMP reveals a acute kidney injury with a creatinine of 1.40. GFR of 39. Chest x-ray shows left upper lobe atelectasis and recommend CT of the chest with contrast to exclude obstructing  central pulmonary mass. CT chest with contrast shows an age-indeterminate pulmonary embolism. She also has significant atelectasis in the left upper lobe resulting in volume loss. No underlying  cause identified. There is also opacification of the right lower lobe bronchi which may represent mucus or aspirated material. No infiltrate. We'll start on heparin for pulmonary embolism with hypoxia. Also start Levaquin for likely aspirated pneumonia. At reevaluation patient is doing well with 2 L via [place] and oxygen saturation is 97%.  We'll admit to medicine. I consulted with hospitalist Dr. Loleta Books who accepted the patient for admission.  This patient was discussed with and evaluated by Dr. Thomasene Lot who agrees with assessment and plan.   Final Clinical Impressions(s) / ED Diagnoses   Final diagnoses:  Other acute pulmonary embolism without acute cor pulmonale (HCC)  Hypoxia  AKI (acute kidney injury) ([place])  Aspiration pneumonia of right lower lobe, unspecified aspiration pneumonia type [facility])    New Prescriptions Current Discharge Medication List       Waynetta Pean, PA-C 06/26/16 0044    Macarthur Critchley, MD 06/29/16 0345

## 2016-06-25 NOTE — Progress Notes (Signed)
ANTICOAGULATION CONSULT NOTE - Initial Consult  Pharmacy Consult for heparin  Indication: pulmonary embolus  Patient Measurements: Heparin Dosing Weight: 61 kg   Vital Signs: Temp: 98 F (36.7 C) (06/07 1917) Temp Source: Oral (06/07 1917) BP: 124/85 (06/07 2115) Pulse Rate: 56 (06/07 2200)  Labs:  Recent Labs  06/23/16 06/25/16 2000  HGB 10.4* 9.7*  HCT 33* 31.5*  PLT 182 303  CREATININE 1.1 1.40*    Estimated Creatinine Clearance: 25.6 mL/min (A) (by C-G formula based on SCr of 1.4 mg/dL (H)).   Medical History: Past Medical History:  Diagnosis Date  . Anemia   . Arthritis   . Chronic kidney disease   . Diabetes mellitus without complication ([place])   . Hemorrhoid   . Hypertension   . Stroke ([place])   . Thyroid disease    Assessment: 83 yo female admitted with pulmonary congestion, found to have intermediate PE on imaging. Pharmacy consulted to dose heparin. No oral anticoagulation PTA. Hgb 9.7 and platelets within normal limits. No overt s/s bleeding noted.   Goal of Therapy:  Heparin level 0.3-0.7 units/ml Monitor platelets by anticoagulation protocol: Yes   Plan:  Heparin 3000 units x1, then start infusion at 1000 units/hr  Heparin level 8 hours  Daily heparin level and CBC Monitor for s/s bleeding Follow-up long-term anticoagulation plan   Argie Ramming, PharmD Pharmacy Resident  Pager [phone] 06/25/16 10:35 PM

## 2016-06-25 NOTE — Progress Notes (Signed)
Entered in error

## 2016-06-26 ENCOUNTER — Inpatient Hospital Stay (HOSPITAL_COMMUNITY): Payer: Medicare Other

## 2016-06-26 ENCOUNTER — Encounter (HOSPITAL_COMMUNITY): Payer: Self-pay | Admitting: Family Medicine

## 2016-06-26 DIAGNOSIS — D631 Anemia in chronic kidney disease: Secondary | ICD-10-CM

## 2016-06-26 DIAGNOSIS — I2699 Other pulmonary embolism without acute cor pulmonale: Principal | ICD-10-CM

## 2016-06-26 DIAGNOSIS — E876 Hypokalemia: Secondary | ICD-10-CM

## 2016-06-26 DIAGNOSIS — N183 Chronic kidney disease, stage 3 (moderate): Secondary | ICD-10-CM

## 2016-06-26 DIAGNOSIS — E034 Atrophy of thyroid (acquired): Secondary | ICD-10-CM

## 2016-06-26 DIAGNOSIS — I10 Essential (primary) hypertension: Secondary | ICD-10-CM

## 2016-06-26 DIAGNOSIS — F0150 Vascular dementia without behavioral disturbance: Secondary | ICD-10-CM

## 2016-06-26 DIAGNOSIS — E440 Moderate protein-calorie malnutrition: Secondary | ICD-10-CM | POA: Diagnosis present

## 2016-06-26 DIAGNOSIS — R0902 Hypoxemia: Secondary | ICD-10-CM

## 2016-06-26 DIAGNOSIS — I5030 Unspecified diastolic (congestive) heart failure: Secondary | ICD-10-CM

## 2016-06-26 DIAGNOSIS — L89620 Pressure ulcer of left heel, unstageable: Secondary | ICD-10-CM

## 2016-06-26 LAB — BASIC METABOLIC PANEL
Anion gap: 9 (ref 5–15)
BUN: 20 mg/dL (ref 6–20)
CALCIUM: 8 mg/dL — AB (ref 8.9–10.3)
CHLORIDE: 103 mmol/L (ref 101–111)
CO2: 26 mmol/L (ref 22–32)
CREATININE: 1.36 mg/dL — AB (ref 0.44–1.00)
GFR calc Af Amer: 40 mL/min — ABNORMAL LOW (ref >60)
GFR calc non Af Amer: 35 mL/min — ABNORMAL LOW (ref >60)
Glucose, Bld: 116 mg/dL — ABNORMAL HIGH (ref 65–99)
Potassium: 3.3 mmol/L — ABNORMAL LOW (ref 3.5–5.1)
Sodium: 138 mmol/L (ref 135–145)

## 2016-06-26 LAB — MAGNESIUM
Magnesium: 1.6 mg/dL — ABNORMAL LOW (ref 1.7–2.4)
Magnesium: 1.6 mg/dL — ABNORMAL LOW (ref 1.7–2.4)

## 2016-06-26 LAB — HEPARIN LEVEL (UNFRACTIONATED)
HEPARIN UNFRACTIONATED: 0.2 [IU]/mL — AB (ref 0.30–0.70)
HEPARIN UNFRACTIONATED: 0.56 [IU]/mL (ref 0.30–0.70)

## 2016-06-26 LAB — ECHOCARDIOGRAM COMPLETE
HEIGHTINCHES: 67 in
Weight: 2208 oz

## 2016-06-26 LAB — CBC
HEMATOCRIT: 32.3 % — AB (ref 36.0–46.0)
HEMOGLOBIN: 10.1 g/dL — AB (ref 12.0–15.0)
MCH: 23.7 pg — ABNORMAL LOW (ref 26.0–34.0)
MCHC: 31.3 g/dL (ref 30.0–36.0)
MCV: 75.6 fL — ABNORMAL LOW (ref 78.0–100.0)
Platelets: 285 10*3/uL (ref 150–400)
RBC: 4.27 MIL/uL (ref 3.87–5.11)
RDW: 14.7 % (ref 11.5–15.5)
WBC: 7.3 10*3/uL (ref 4.0–10.5)

## 2016-06-26 LAB — MRSA PCR SCREENING: MRSA by PCR: NEGATIVE

## 2016-06-26 MED ORDER — ENSURE ENLIVE PO LIQD
1.0000 | Freq: Four times a day (QID) | ORAL | Status: DC
  Administered 2016-06-26 – 2016-06-27 (×2): 237 mL via ORAL

## 2016-06-26 MED ORDER — POTASSIUM CHLORIDE CRYS ER 20 MEQ PO TBCR
40.0000 meq | EXTENDED_RELEASE_TABLET | Freq: Two times a day (BID) | ORAL | Status: AC
  Administered 2016-06-26: 40 meq via ORAL
  Filled 2016-06-26 (×2): qty 2

## 2016-06-26 MED ORDER — FERROUS SULFATE 325 (65 FE) MG PO TABS
325.0000 mg | ORAL_TABLET | Freq: Two times a day (BID) | ORAL | Status: DC
  Administered 2016-06-26 – 2016-06-29 (×6): 325 mg via ORAL
  Filled 2016-06-26 (×6): qty 1

## 2016-06-26 MED ORDER — DONEPEZIL HCL 5 MG PO TABS
5.0000 mg | ORAL_TABLET | Freq: Every day | ORAL | Status: DC
  Administered 2016-06-27 – 2016-06-28 (×2): 5 mg via ORAL
  Filled 2016-06-26 (×3): qty 1

## 2016-06-26 MED ORDER — LORAZEPAM 0.5 MG PO TABS
0.5000 mg | ORAL_TABLET | Freq: Every day | ORAL | Status: DC
  Administered 2016-06-26 – 2016-06-27 (×2): 0.5 mg via ORAL
  Filled 2016-06-26 (×2): qty 1

## 2016-06-26 MED ORDER — METOPROLOL TARTRATE 12.5 MG HALF TABLET
12.5000 mg | ORAL_TABLET | Freq: Two times a day (BID) | ORAL | Status: DC
  Administered 2016-06-26 (×2): 12.5 mg via ORAL
  Filled 2016-06-26 (×2): qty 1

## 2016-06-26 MED ORDER — POTASSIUM CHLORIDE 10 MEQ/100ML IV SOLN
10.0000 meq | INTRAVENOUS | Status: AC
  Administered 2016-06-26 – 2016-06-27 (×2): 10 meq via INTRAVENOUS
  Filled 2016-06-26: qty 100

## 2016-06-26 MED ORDER — SODIUM CHLORIDE 0.9 % IV SOLN
INTRAVENOUS | Status: DC
  Administered 2016-06-26 – 2016-06-27 (×3): via INTRAVENOUS

## 2016-06-26 MED ORDER — PAROXETINE HCL 20 MG PO TABS
40.0000 mg | ORAL_TABLET | Freq: Every day | ORAL | Status: DC
  Administered 2016-06-26 – 2016-06-29 (×4): 40 mg via ORAL
  Filled 2016-06-26 (×4): qty 2

## 2016-06-26 MED ORDER — ACETAMINOPHEN 325 MG PO TABS
650.0000 mg | ORAL_TABLET | Freq: Four times a day (QID) | ORAL | Status: DC | PRN
Start: 2016-06-26 — End: 2016-06-29

## 2016-06-26 MED ORDER — PRO-STAT SUGAR FREE PO LIQD
30.0000 mL | Freq: Three times a day (TID) | ORAL | Status: DC
  Administered 2016-06-26 – 2016-06-28 (×6): 30 mL via ORAL
  Filled 2016-06-26 (×4): qty 30

## 2016-06-26 MED ORDER — SODIUM CHLORIDE 0.9% FLUSH
3.0000 mL | Freq: Two times a day (BID) | INTRAVENOUS | Status: DC
  Administered 2016-06-26 – 2016-06-27 (×3): 3 mL via INTRAVENOUS

## 2016-06-26 MED ORDER — RESOURCE THICKENUP CLEAR PO POWD
ORAL | Status: DC | PRN
  Filled 2016-06-26: qty 125

## 2016-06-26 MED ORDER — ORAL CARE MOUTH RINSE
15.0000 mL | Freq: Two times a day (BID) | OROMUCOSAL | Status: DC
  Administered 2016-06-26 – 2016-06-29 (×4): 15 mL via OROMUCOSAL

## 2016-06-26 MED ORDER — HYDROCODONE-ACETAMINOPHEN 10-325 MG PO TABS
1.0000 | ORAL_TABLET | Freq: Two times a day (BID) | ORAL | Status: DC
  Administered 2016-06-26 – 2016-06-27 (×2): 1 via ORAL
  Filled 2016-06-26 (×3): qty 1

## 2016-06-26 MED ORDER — LEVOTHYROXINE SODIUM 25 MCG PO TABS: 137.0000 ug | ORAL_TABLET | Freq: Every day | ORAL | Status: DC

## 2016-06-26 MED ORDER — ACETAMINOPHEN 650 MG RE SUPP: 650.0000 mg | Freq: Four times a day (QID) | RECTAL | Status: DC | PRN

## 2016-06-26 MED ORDER — ROPINIROLE HCL 1 MG PO TABS
3.0000 mg | ORAL_TABLET | Freq: Every day | ORAL | Status: DC
  Administered 2016-06-27 – 2016-06-28 (×2): 3 mg via ORAL
  Filled 2016-06-26 (×3): qty 3

## 2016-06-26 MED ORDER — LEVOTHYROXINE SODIUM 100 MCG IV SOLR
68.0000 ug | Freq: Every day | INTRAVENOUS | Status: DC
  Administered 2016-06-27 – 2016-06-28 (×2): 68 ug via INTRAVENOUS
  Filled 2016-06-26 (×2): qty 5

## 2016-06-26 MED ORDER — METOPROLOL TARTRATE 5 MG/5ML IV SOLN
2.5000 mg | Freq: Three times a day (TID) | INTRAVENOUS | Status: DC
  Administered 2016-06-27 – 2016-06-29 (×7): 2.5 mg via INTRAVENOUS
  Filled 2016-06-26 (×6): qty 5

## 2016-06-26 NOTE — Progress Notes (Signed)
Pt arrived to 2w29 from ED. Tele box placed, verified x2. VSS. Pt denies any SOB or pain. Only oriented to self, no family at bedside. Lungs noted to be very wet sounding in upper lobes, diminished in bases. Coughed immediately as this RN attempted to give pt some water. Danford MD notified and verbal order given to place swallow eval and make pt NPO for now. Fluids started. Will continue to monitor.  Jaymes Graff, RN

## 2016-06-26 NOTE — Progress Notes (Signed)
ANTICOAGULATION CONSULT NOTE - Follow Up Consult  Pharmacy Consult for Heparin Indication: pulmonary embolus    Patient Measurements: Height: 5\' 7"  (170.2 cm) Weight: 138 lb (62.6 kg) IBW/kg (Calculated) : 61.6 Heparin Dosing Weight:  62.6 kg  Vital Signs: Temp: 98.3 F (36.8 C) (06/08 0452) Temp Source: Oral (06/08 0452) BP: 142/69 (06/08 0452) Pulse Rate: 62 (06/08 0452)  Labs:  Recent Labs  06/25/16 2000 06/26/16 0105 06/26/16 0726  HGB 9.7* 10.1*  --   HCT 31.5* 32.3*  --   PLT 303 285  --   HEPARINUNFRC  --   --  0.20*  CREATININE 1.40* 1.36*  --     Estimated Creatinine Clearance: 30.5 mL/min (A) (by C-G formula based on SCr of 1.36 mg/dL (H)).   Assessment:  Anticoag: Found to have intermediate PE on imaging.  No oral anticoagulation PTA (plavix on PTA list). Hgb 9.7 and platelets within normal limits. No overt s/s bleeding noted.  - HL 0.2, Hgb 10.1 at baseline with anemia, Plts 285   Goal of Therapy:  Heparin level 0.3-0.7 units/ml Monitor platelets by anticoagulation protocol: Yes   Plan:  Increase Iv heparin to 1150 units/hr Recheck heparin level in 8 hrs Daily Hl and CBC   Alie Moudy S. Alford Highland, PharmD, BCPS Clinical Staff Pharmacist Pager [phone]  Eilene Ghazi Stillinger 06/26/2016,8:51 AM

## 2016-06-26 NOTE — Progress Notes (Signed)
  Echocardiogram 2D Echocardiogram has been performed.  [name] M 06/26/2016, 9:13 AM

## 2016-06-26 NOTE — H&P (Signed)
History and Physical  Patient Name: [name]     WCB:[id number]    DOB: 11-Nov-1930    DOA: 06/25/2016 PCP: Gildardo Cranker, DO  Patient coming from: SNF, [facility] staff state [facility]", paperwork at bedside says "[name]"   Chief Complaint: Cough      HPI: [name] is a 83 y.o. female with a past medical history significant for dementia, CVA, hypothyroidism, PCM, NIDDM, HTN, CKD III, and hx of BrCa who presents with cough.  Caveat that all history is collected from nursing home staff EDP as the patient has advanced dementia.  Per report, she first developed cough in the last few days.  Was started on Avelox (last given 6/6), although she seemed to progress and so today EMS were called for transport to the ER.  Her O2 saturations at SNF were normal.  ED course: -Afebrile, heart rate 70, respirations 22, pulse oximetry 84% here on room air, blood pressure 118/64 -Na 144, K 2.9, Cr 1.4 (baseline 1.1), WBC 6.7K, Hgb 9.7 and microcytic -CXR showed LUL atelectasis, recommended CT chest -CT chest with IV contrast showed a left lower lobar artery-level PE -She was given 30 mEq K and started on heparin drip and TRH were asked to admit for PE   The patient did have an episode of anemia requiring transfusion in 2016, although her FOBT was negative and she was judged a poor candidate for further work up and so never had endoscopy.       ROS: Review of Systems  Unable to perform ROS: Dementia          Past Medical History:  Diagnosis Date  . Anemia   . Arthritis   . Chronic kidney disease   . Diabetes mellitus without complication ([place])   . Hemorrhoid   . Hypertension   . Stroke ([place])   . Thyroid disease     Past Surgical History:  Procedure Laterality Date  . ABDOMINAL HYSTERECTOMY     PARTIAL  . BREAST LUMPECTOMY     LEFT  . MASTECTOMY  05/2008   Left    Social History: Patient lives in a SNF.  The patient does not appear to be able to walk, or if so, only  with a walker.  States she is from [place], worked for [employer] and at [employer] in [employer].    Allergies  Allergen Reactions  . Aspirin Hives  . Penicillins Hives        Family history: Unable to obtain due to dementia.  Prior to Admission medications   Medication Sig Start Date End Date Taking? Authorizing Provider  Amino Acids-Protein Hydrolys (FEEDING SUPPLEMENT, PRO-STAT SUGAR FREE 64,) LIQD Take 30 mLs by mouth 3 (three) times daily with meals.   Yes [provider]  amLODipine (NORVASC) 10 MG tablet Take 10 mg by mouth daily.   Yes [provider]  Ascorbic Acid (VITAMIN C) 1000 MG tablet Take 1,000 mg by mouth daily.   Yes [provider]  Cholecalciferol (VITAMIN D) 2000 UNITS tablet Take 2,000 Units by mouth daily.   Yes [provider]  clopidogrel (PLAVIX) 75 MG tablet Take 1 tablet (75 mg total) by mouth daily. 07/09/14  Yes Vann, Jessica U, DO  donepezil (ARICEPT) 5 MG tablet Take 5 mg by mouth at bedtime.    Yes [provider]  ferrous sulfate 325 (65 FE) MG tablet Take 325 mg by mouth 2 (two) times daily with a meal.   Yes [provider]  HYDROcodone-acetaminophen (NORCO) 10-325 MG tablet Take 1 tablet by mouth 2 (two) times daily. 06/16/16  Yes Lauree Chandler, NP  ipratropium-albuterol (DUONEB) 0.5-2.5 (3) MG/3ML SOLN Take 3 mLs by nebulization every 6 (six) hours as needed (wheezing).   Yes [provider]  levothyroxine (SYNTHROID, LEVOTHROID) 137 MCG tablet Take 137 mcg by mouth daily before breakfast.   Yes [provider]  lisinopril (PRINIVIL,ZESTRIL) 40 MG tablet Take 40 mg by mouth daily.   Yes [provider]  LORazepam (ATIVAN) 0.5 MG tablet Take 0.5 mg by mouth daily.    Yes [provider]  magnesium hydroxide (MILK OF MAGNESIA) 400 MG/5ML suspension Take 45 mLs by mouth daily as needed for mild constipation.   Yes [provider]  metoprolol tartrate  (LOPRESSOR) 25 MG tablet Take 12.5 mg by mouth 2 (two) times daily.   Yes [provider]  moxifloxacin (AVELOX) 400 MG tablet Take 400 mg by mouth daily at 8 pm.   Yes [provider]  Multiple Vitamins-Minerals (ELDERTONIC PO) Give 15 cc by mouth two times daily for weight loss   Yes [provider]  Nutritional Supplements (NUTRITIONAL SUPPLEMENT PO) Take 120 mLs by mouth 4 (four) times daily. Puree texture, regular consistency   Yes [provider]  PARoxetine (PAXIL) 40 MG tablet Take 40 mg by mouth daily.   Yes [provider]  rOPINIRole (REQUIP) 3 MG tablet Take 3 mg by mouth at bedtime.  10/20/10  Yes [provider]  saccharomyces boulardii (FLORASTOR) 250 MG capsule Take 250 mg by mouth 2 (two) times daily.   Yes [provider]  UNABLE TO [place] Cup every day with lunch and dinner   Yes [provider]  vitamin B-12 1000 MCG tablet Take 1 tablet (1,000 mcg total) by mouth daily. 07/09/14  Yes Geradine Girt, DO       Physical Exam: BP (!) 115/97 (BP Location: Right Arm)   Pulse (!) 56   Temp 97.9 F (36.6 C) (Oral)   Resp 17   Ht 5' 7"  (1.702 m)   Wt 62.6 kg (138 lb)   SpO2 93%   BMI 21.61 kg/m  General appearance: Frail elderly adult female, awake, responsive and in no acute distress.   Eyes: Anicteric, conjunctiva pink, lids and lashes normal. PERRL.    ENT: No nasal deformity, discharge, epistaxis.  Hearing normal. OP moist without lesions.  Upper dentures. Neck: No neck masses.  Trachea midline.  No thyromegaly/tenderness. Lymph: No cervical or supraclavicular lymphadenopathy. Cardiac: Slow, regular, nl S1-S2, no murmurs appreciated.  Capillary refill is brisk.  JVP not visible.  No LE edema.  Radial and DP pulses 2+ and symmetric. Respiratory: Normal respiratory rate and rhythm.  Coarse breath sounds and low pitched wheezes bilaterally.  No focal rales. Abdomen: Abdomen soft.   No TTP. No ascites, distension, hepatosplenomegaly.   MSK: No deformities or effusions.  No cyanosis or clubbing. Neuro: Cranial nerves appear grossly symmetric. Speech is fluent.  Muscle strength symmetric but globally weak.    Psych: Sensorium intact and responding to questions, but memory very impaired.  States she is at some motel, can't name president.  Unsure why she is here.  Attention seems diminished.  Behavior appropriate in setting of dementia.  Affect silghtly blunted.           Labs on Admission:  I have personally reviewed following labs and imaging studies: CBC:  Recent  Labs Lab 06/23/16 06/25/16 2000 06/26/16 0105  WBC 5.0 6.7 7.3  NEUTROABS 3 4.6  --   HGB 10.4* 9.7* 10.1*  HCT 33* 31.5* 32.3*  MCV  --  75.9* 75.6*  PLT 182 303 937   Basic Metabolic Panel:  Recent Labs Lab 06/23/16 06/25/16 2000 06/25/16 2349 06/26/16 0105  NA 141 144  --  138  K 3.6 2.9*  --  3.3*  CL  --  107  --  103  CO2  --  28  --  26  GLUCOSE  --  155*  --  116*  BUN 20 21*  --  20  CREATININE 1.1 1.40*  --  1.36*  CALCIUM  --  8.2*  --  8.0*  MG  --   --  1.6* 1.6*   GFR: Estimated Creatinine Clearance: 30.5 mL/min (A) (by C-G formula based on SCr of 1.36 mg/dL (H)).  Liver Function Tests:  Recent Labs Lab 06/23/16  AST 18  ALT 8  ALKPHOS 76   No results for input(s): LIPASE, AMYLASE in the last 168 hours. No results for input(s): AMMONIA in the last 168 hours. Coagulation Profile: No results for input(s): INR, PROTIME in the last 168 hours. Cardiac Enzymes: No results for input(s): CKTOTAL, CKMB, CKMBINDEX, TROPONINI in the last 168 hours. BNP (last 3 results) No results for input(s): PROBNP in the last 8760 hours. HbA1C: No results for input(s): HGBA1C in the last 72 hours. CBG: No results for input(s): GLUCAP in the last 168 hours. Lipid Profile: No results for input(s): CHOL, HDL, LDLCALC, TRIG, CHOLHDL, LDLDIRECT in the last 72 hours. Thyroid Function  Tests: No results for input(s): TSH, T4TOTAL, FREET4, T3FREE, THYROIDAB in the last 72 hours. Anemia Panel: No results for input(s): VITAMINB12, FOLATE, FERRITIN, TIBC, IRON, RETICCTPCT in the last 72 hours. Sepsis Labs: Invalid input(s): PROCALCITONIN, LACTICIDVEN No results found for this or any previous visit (from the past 240 hour(s)).       Radiological Exams on Admission: Personally reviewed CXR shows scarring or atelectasis in LUL; CT chest report reviewed: Dg Chest 2 View  Result Date: 06/25/2016 CLINICAL DATA:  Cough. Pt reports to the ED for eval of continued pulmonary congestion. Pt seemed confused, unable to answer hx questions. She did say she's had a productive cough, but was unsure what color her phlegm was. Hx of DM, HTN, thyroid disease. EXAM: CHEST  2 VIEW COMPARISON:  02/26/2015 chest x-ray and chest CT FINDINGS: The heart is mildly enlarged. There is new left upper Lower atelectasis. The right lung is clear. There is a small left pleural effusion. Surgical clips overlie the left lateral chest wall. IMPRESSION: Left upper lobar atelectasis. Recommend CT of the chest with contrast to exclude obstructing central pulmonary mass. Electronically Signed   By: Nolon Nations M.D.   On: 06/25/2016 20:27   Ct Chest W Contrast  Result Date: 06/25/2016 CLINICAL DATA:  The patient has a cough. CT recommended to exclude a central obstructing mass on the left based on chest x-ray. EXAM: CT CHEST WITH CONTRAST TECHNIQUE: Multidetector CT imaging of the chest was performed during intravenous contrast administration. CONTRAST:  75 mL of Isovue 370 COMPARISON:  Chest x-ray from earlier today and CT scan February 26, 2015 FINDINGS: Cardiovascular: The heart is unchanged. Coronary artery calcifications are noted. The thoracic aorta is non aneurysmal. No dissection. The study was not tailored to evaluate for pulmonary emboli. No emboli are seen in the main pulmonary arteries. A linear  filling defect  is seen in the periphery of a left lower lobe pulmonary artery as seen on axial image 66 and coronal image 68. No emboli seen on the right. The volume loss on the left results in mild shift of the mediastinum to the left. Mediastinum/Nodes: The esophagus is normal. No adenopathy identified. Moderate left pleural effusion is seen. No pericardial effusion or right pleural effusion. Lungs/Pleura: The trachea and mainstem bronchi are normal. Opacification of right lower lobe airways. There is significant atelectasis in the left upper lobe. No underlying cause is identified. No centrally obstructing masses noted. There is more mild atelectasis in the left base. There is a moderate left-sided pleural effusion. Dependent atelectasis seen on the right. No pneumothorax. No suspicious pulmonary nodule or mass is noted. Upper Abdomen: Renal cysts are identified. Increased enhancement in the hepatic dome as seen on coronal image 89, probably a hemangioma or perfusional variant. Another probable hemangioma is seen in the hepatic dome on image 100 as well. No other acute abnormalities in the upper abdomen. Musculoskeletal: No chest wall abnormality. No acute or significant osseous findings. IMPRESSION: 1. There is a linear filling defect in the periphery of a left lower lobe pulmonary artery. This is consistent with an age indeterminate pulmonary embolus. While indeterminate, this may not be acute. No other emboli identified. Recommend clinical correlation. 2. Significant atelectasis in the left upper lobe resulting in volume loss. No underlying cause identified. No central obstructing mass is noted. The underlying lung is not well assessed. Recommend short-term follow-up to ensure resolution. 3. Opacification of right lower lobe bronchi may represent mucus or aspirated material. No infiltrate. 4. Moderate left-sided pleural effusion. 5. No other acute abnormalities. These results will be called to the ordering clinician or  representative by the Radiologist Assistant, and communication documented in the PACS or zVision Dashboard. Electronically Signed   By: Dorise Bullion III M.D   On: 06/25/2016 22:00    EKG: Independently reviewed. Rate 59, QTc 486, NSR.  Echocardiogram 2016: Report reviewed ER 55-60% Grade I DD          Assessment/Plan Principal Problem:   PE (pulmonary thromboembolism) (HCC) Active Problems:   Essential hypertension   CKD (chronic kidney disease), stage III   Hypokalemia   Decubitus ulcer of left heel, unstageable (HCC)   Dementia without behavioral disturbance   Anemia in chronic renal disease   Diabetes mellitus with renal manifestations, controlled ([place])   Hypothyroidism due to acquired atrophy of thyroid   Moderate protein-calorie malnutrition ([place])  1. Pulmonary embolism:  Age indeterminate, maybe acute.   -Heparin gtt -CM consult re: NOAC copay -Echocardiogram ordered -Granddaughter (POA) not available overnight to discuss risk/benefit of anticoagulation and overall goals of care; contact information is in NH notes and Media tab under Advanced Directives   2. Hypokalemia:  -Replete K -Check mag  3. Abnormal CT findings and left upper lobe:  No fever or leukocytosis. Not clear that this is pneumonia at present. -Monitor for fever -Hold antibiotics -Short-term follow-up imaging recommended by radiology  4. Anemia:  -Continue iron  5. Hypertension and history of CVA:  -Stop Plavix while starting anti-coagulation -Hold lisinopril and amlodipine until hemodynamics clear -Continue metoprolol  6. Hypothyroidism:  -Continue levothyroxine  7. Diabetes:  This is diet-controlled and she is euglycemic at presentation  8. Severe protein calorie malnutrition: -Continue daily supplements  9. Dementia and mood: -Continue Paxil and lorazepam -Continue Aricept  10. Heel ulcer: Present on arrival -WOC consult  DVT prophylaxis: N/A    Code Status: FULL  Family Communication: None present  Disposition Plan: Anticipate IV heparin and transition to oral anticoagulant pending discussion with POA Consults called: None Admission status: INPATIENT         Medical decision making: Patient seen at 12:15 AM on 06/26/2016.  The patient was discussed with Will Dansie, PA-C.  What exists of the patient's chart was reviewed in depth and summarized above.  Clinical condition: hemodynamically stable at present for telemetry floor.        Edwin Dada [facility] Pager [phone]         At the time of admission, it appears that the appropriate admission status for this patient is INPATIENT. This is judged to be reasonable and necessary in order to provide the required intensity of service to ensure the patient's safety given the presenting symptoms, physical exam findings, and initial radiographic and laboratory data in the context of their chronic comorbidities.  Together, these circumstances are felt to place her at high risk for further clinical deterioration threatening life, limb, or organ.   Patient requires inpatient status due to high intensity of service, high risk for further deterioration and high frequency of surveillance required because of this acute illness that poses a threat to life, limb or bodily function.  Factors support inpatient status include pulmonary embolism with acute hypoxic respiratory failure in setting of electroyte abnormalities, anemia, chronic kidney disease, advanced age, malnutrition and dementia  I certify that at the point of admission it is my clinical judgment that the patient will require inpatient hospital care spanning beyond 2 midnights from the point of admission and that early discharge would result in unnecessary risk of decompensation and readmission or threat to life, limb or bodily function.

## 2016-06-26 NOTE — Progress Notes (Signed)
Patient seen and evaluated earlier the same by my associate. Please refer to H&P for details regarding assessment and plan.  Daughter was updated and her questions were answered to her satisfaction.  Vital signs reviewed and stable Gen.: Patient in no acute distress, alert and awake Heart and vascular: No cyanosis Pulmonary: Equal chest rise, no wheezes  Will plan on reassessing next am.  Velvet Bathe

## 2016-06-26 NOTE — Progress Notes (Signed)
ANTICOAGULATION CONSULT NOTE - FOLLOW UP    HL = 0.56 (goal 0.3 - 0.7 units/mL) Heparin dosing weight = 63 kg   Assessment: 85 YOF continues on IV heparin for intermediate PE.  Heparin level is therapeutic post rate adjustment.  No bleeding reported.   Plan: - Continue heparin gtt at 1150 units/hr - F/U AM labs    Montrice Montuori D. Mina Marble, PharmD, BCPS 06/26/2016, 7:24 PM

## 2016-06-26 NOTE — Evaluation (Addendum)
l Clinical/Bedside Swallow Evaluation Patient Details  Name: [name] MRN: [id number] Date of Birth: 1930/08/17  Today's Date: 06/26/2016 Time: SLP Start Time (ACUTE ONLY): 1042 SLP Stop Time (ACUTE ONLY): 1100 SLP Time Calculation (min) (ACUTE ONLY): 18 min  Past Medical History:  Past Medical History:  Diagnosis Date  . Anemia   . Arthritis   . Chronic kidney disease   . Diabetes mellitus without complication ([place])   . Hemorrhoid   . Hypertension   . Stroke ([place])   . Thyroid disease    Past Surgical History:  Past Surgical History:  Procedure Laterality Date  . ABDOMINAL HYSTERECTOMY     PARTIAL  . BREAST LUMPECTOMY     LEFT  . MASTECTOMY  05/2008   Left   HPI:  83 y.o.femalefrom SNF with a past medical history significant for dementia, CVA, hypothyroidism, PCM, NIDDM, HTN, CKD III, and hx of BrCawho presents with cough.  Difficulty swallowing water per RN note, hence swallow evaluation ordered. Hx of dysphagia; multiple MBS in past.  Last MBS 12/20/15-moderate dysphagia with pharyngeal delay, sensed aspiration of thin liquids prior to swallow response; recs were for dysphagia 2, nectar-thick liquids.   Assessment / Plan / Recommendation Clinical Impression  Pt presents with a chronic dysphagia, with known aspiration of thin liquids.  Today, eager to eat.- mastication is delayed, but functional. Thin liquids elicit cough, consistent with history of aspiration.  Nectar-thick liquids consumed without overt difficulty.  Recommend resuming diet of dysphagia 2, nectar-thick liquids.  Crush meds in puree.  Will follow briefly to ensure toleration.  D/W pt's daughter and RN.  SLP Visit Diagnosis: Dysphagia, unspecified (R13.10)    Aspiration Risk  Moderate aspiration risk    Diet Recommendation   dys 2, nectar thick liquids  Medication Administration: Crushed with puree    Other  Recommendations Oral Care Recommendations: Oral care BID Other Recommendations: Order  thickener from pharmacy   Follow up Recommendations Skilled Nursing facility      Frequency and Duration min 2x/week  1 week       Prognosis        Swallow Study   General HPI: 83 y.o.femalefrom SNF with a past medical history significant for dementia, CVA, hypothyroidism, PCM, NIDDM, HTN, CKD III, and hx of BrCawho presents with cough.  Difficulty swallowing water per RN note, hence swallow evaluation ordered. Hx of dysphagia; multiple MBS in past.  Last MBS 12/20/15-moderate dysphagia with pharyngeal delay, sensed aspiration of thin liquids prior to swallow response; recs were for dysphagia 2, nectar-thick liquids. Type of Study: Bedside Swallow Evaluation Previous Swallow Assessment: see HPI Diet Prior to this Study: NPO Temperature Spikes Noted: No Respiratory Status: Nasal cannula History of Recent Intubation: No Behavior/Cognition: Alert;Cooperative;Pleasant mood Oral Cavity Assessment: Within Functional Limits Oral Care Completed by SLP: No Oral Cavity - Dentition: Adequate natural dentition Vision: Functional for self-feeding Self-Feeding Abilities: Needs assist Patient Positioning: Upright in bed Baseline Vocal Quality: Normal Volitional Cough: Strong Volitional Swallow: Able to elicit    Oral/Motor/Sensory Function Overall Oral Motor/Sensory Function: Within functional limits   Ice Chips Ice chips: Not tested   Thin Liquid Thin Liquid: Impaired Presentation: Cup Pharyngeal  Phase Impairments: Multiple swallows;Cough - Immediate    Nectar Thick Nectar Thick Liquid: Impaired Presentation: Cup Oral phase functional implications: Prolonged oral transit Pharyngeal Phase Impairments: Multiple swallows   Honey Thick Honey Thick Liquid: Not tested   Puree Puree: Within functional limits   Solid   GO   Solid:  Not tested        [name] 06/26/2016,11:06 AM

## 2016-06-26 NOTE — Consult Note (Addendum)
[facility] Nurse wound consult note Reason for Consult: Consult requested for left heel wound.  Pt has pink dry scar tissue to right heel from previous wound which has healed.   Wound type: Left heel with healing stage 3 pressure injury; 2X1X.1cm, pink and moist, no odor, small amt yellow drainage.  Surrounded by pink dry scar tissue Pressure Injury POA: Yes Dressing procedure/placement/frequency: Foam dressing to protect from further injury and promote healing.  Float heel to reduce pressure. No family at the bedside to discuss plan of care. Please re-consult if further assistance is needed.  Thank-you,  Julien Girt MSN, [place], [place], [place], [place]

## 2016-06-26 NOTE — Progress Notes (Signed)
Per Insurance check for [place] (eliquis/xarleto)-- pt has zero dollar copay cost for either  # 2. [facility] [place] RX # [phone]   1. ELIQUIS 2.5 MG BID AND 5 MG BID  COVER- YES               YES  CO-PAY- ZERO DOLLARS     SAME  TIER- 3 DRUG               SAME  PRIOR APPROVAL- NO       SAME  MAIL-ORDER FOR 90 DAYS SUPPLY ZERO DOLLARS   2. XARELTO  15 MG BID  AND  20 MG DAILY  COVER- YES                 SAME  CO-PAY- ZERO DOLLARS      SAME  TIER- 3 DRUG                 SAME  PRIOR APPROVAL- NO  MAIL-ORDER FOR 90 DAYS SUPPLY-ZERO DOLLARS          PATIENT :HAS LOWE INCOME SUBSITYNE LEVEL 3   PREFERRED PHARMACY: [facility], [facility], [facility]

## 2016-06-27 LAB — HEPARIN LEVEL (UNFRACTIONATED)
Heparin Unfractionated: 0.82 IU/mL — ABNORMAL HIGH (ref 0.30–0.70)
Heparin Unfractionated: 0.3 IU/mL (ref 0.30–0.70)
Heparin Unfractionated: 0.38 IU/mL (ref 0.30–0.70)

## 2016-06-27 LAB — CBC
HEMATOCRIT: 31.1 % — AB (ref 36.0–46.0)
HEMOGLOBIN: 9.8 g/dL — AB (ref 12.0–15.0)
MCH: 23.9 pg — AB (ref 26.0–34.0)
MCHC: 31.5 g/dL (ref 30.0–36.0)
MCV: 75.9 fL — ABNORMAL LOW (ref 78.0–100.0)
Platelets: 344 10*3/uL (ref 150–400)
RBC: 4.1 MIL/uL (ref 3.87–5.11)
RDW: 15.1 % (ref 11.5–15.5)
WBC: 8.7 10*3/uL (ref 4.0–10.5)

## 2016-06-27 MED ORDER — HYDROCODONE-ACETAMINOPHEN 5-325 MG PO TABS: 1.0000 | ORAL_TABLET | Freq: Two times a day (BID) | ORAL | Status: DC | PRN

## 2016-06-27 MED ORDER — POTASSIUM CHLORIDE CRYS ER 20 MEQ PO TBCR
40.0000 meq | EXTENDED_RELEASE_TABLET | Freq: Once | ORAL | Status: AC
  Administered 2016-06-27: 40 meq via ORAL
  Filled 2016-06-27: qty 2

## 2016-06-27 MED ORDER — LORAZEPAM 0.5 MG PO TABS
0.5000 mg | ORAL_TABLET | Freq: Every day | ORAL | Status: DC | PRN
  Administered 2016-06-28 – 2016-06-29 (×2): 0.5 mg via ORAL
  Filled 2016-06-27 (×2): qty 1

## 2016-06-27 MED ORDER — POTASSIUM CHLORIDE 10 MEQ/100ML IV SOLN
INTRAVENOUS | Status: AC
  Filled 2016-06-27: qty 100

## 2016-06-27 NOTE — Progress Notes (Signed)
PROGRESS NOTE    [name]  HWE:[id number] DOB: 26-Sep-1930 DOA: 06/25/2016 PCP: Courtney Cranker, DO    Brief Narrative:  Patient is an 83 year old with history of dementia who presented with PE.   Assessment & Plan:   Principal Problem:   PE (pulmonary thromboembolism) (HCC) - Echocardiogram reviewed and reporting normal EF, with pulmonic valve transvalvular velocity within normal range. - We'll continue patient on heparin for one more day then transition to NOAC. Consulted care management for assistance. Discussed with daughter.  Active Problems:   Essential hypertension   CKD (chronic kidney disease), stage III - stable    Hypokalemia - replace orally and reassess    Decubitus ulcer of left heel, unstageable (HCC)   Dementia without behavioral disturbance - Family is reporting that confusion was worse today. Daughter disagrees however and would like to only minimally decrease opioid pain medication as she does not want her mother and pain    Anemia in chronic renal disease   Diabetes mellitus with renal manifestations, controlled ([place]) - most likely diet controlled, blood sugars have fluctuated from 70-155 with the most recent ones closer to 100    Hypothyroidism due to acquired atrophy of thyroid - continue synthroid    Moderate protein-calorie malnutrition ([place]) - continue nutritional supplementation   DVT prophylaxis: heparin gtt Code Status: Full Family Communication: d/c daughter Disposition Plan: d/c next am most likely on NOAC, Care manager consulted.   Consultants:   none   Procedures: echo: 65-70% with normal wall motion    Antimicrobials: none   Subjective: Pt has no new complaints. Reported breathing condition not at baseline.  Objective: Vitals:   06/26/16 1237 06/26/16 2043 06/27/16 0402 06/27/16 1347  BP: 132/76 113/61 (!) 152/80 (!) 158/51  Pulse:  (!) 119 71 68  Resp: 18 20 20 20   Temp: 98.3 F (36.8 C)  98.8 F (37.1 C) 98.6  F (37 C)  TempSrc: Oral  Axillary Oral  SpO2: 98% 95% 96% 97%  Weight:      Height:        Intake/Output Summary (Last 24 hours) at 06/27/16 1421 Last data filed at 06/27/16 0620  Gross per 24 hour  Intake          3096.55 ml  Output                0 ml  Net          3096.55 ml   Filed Weights   06/26/16 0045  Weight: 62.6 kg (138 lb)    Examination:  General exam: Appears calm and comfortable  Respiratory system: Clear to auscultation. Respiratory effort normal. Cardiovascular system: S1 & S2 heard, RRR. No JVD, murmurs, rubs, gallops or clicks. No pedal edema. Gastrointestinal system: Abdomen is nondistended, soft and nontender. No organomegaly or masses felt. Normal bowel sounds heard. Central nervous system: Alert and oriented. No focal neurological deficits. Extremities: Symmetric 5 x 5 power. Skin: No rashes, lesions or ulcers Psychiatry: Judgement and insight appear normal. Mood & affect appropriate.   Data Reviewed: I have personally reviewed following labs and imaging studies  CBC:  Recent Labs Lab 06/23/16 06/25/16 2000 06/26/16 0105 06/27/16 0331  WBC 5.0 6.7 7.3 8.7  NEUTROABS 3 4.6  --   --   HGB 10.4* 9.7* 10.1* 9.8*  HCT 33* 31.5* 32.3* 31.1*  MCV  --  75.9* 75.6* 75.9*  PLT 182 303 285 893   Basic Metabolic Panel:  Recent Labs Lab 06/23/16 06/25/16 2000  06/25/16 2349 06/26/16 0105  NA 141 144  --  138  K 3.6 2.9*  --  3.3*  CL  --  107  --  103  CO2  --  28  --  26  GLUCOSE  --  155*  --  116*  BUN 20 21*  --  20  CREATININE 1.1 1.40*  --  1.36*  CALCIUM  --  8.2*  --  8.0*  MG  --   --  1.6* 1.6*   GFR: Estimated Creatinine Clearance: 30.5 mL/min (A) (by C-G formula based on SCr of 1.36 mg/dL (H)). Liver Function Tests:  Recent Labs Lab 06/23/16  AST 18  ALT 8  ALKPHOS 76   No results for input(s): LIPASE, AMYLASE in the last 168 hours. No results for input(s): AMMONIA in the last 168 hours. Coagulation Profile: No results  for input(s): INR, PROTIME in the last 168 hours. Cardiac Enzymes: No results for input(s): CKTOTAL, CKMB, CKMBINDEX, TROPONINI in the last 168 hours. BNP (last 3 results) No results for input(s): PROBNP in the last 8760 hours. HbA1C: No results for input(s): HGBA1C in the last 72 hours. CBG: No results for input(s): GLUCAP in the last 168 hours. Lipid Profile: No results for input(s): CHOL, HDL, LDLCALC, TRIG, CHOLHDL, LDLDIRECT in the last 72 hours. Thyroid Function Tests: No results for input(s): TSH, T4TOTAL, FREET4, T3FREE, THYROIDAB in the last 72 hours. Anemia Panel: No results for input(s): VITAMINB12, FOLATE, FERRITIN, TIBC, IRON, RETICCTPCT in the last 72 hours. Sepsis Labs: No results for input(s): PROCALCITON, LATICACIDVEN in the last 168 hours.  Recent Results (from the past 240 hour(s))  Culture, blood (routine x 2)     Status: None (Preliminary result)   Collection Time: 06/25/16  5:04 AM  Result Value Ref Range Status   Specimen Description BLOOD LEFT HAND  Final   Special Requests IN PEDIATRIC BOTTLE Blood Culture adequate volume  Final   Culture NO GROWTH 1 DAY  Final   Report Status PENDING  Incomplete  Culture, blood (routine x 2)     Status: None (Preliminary result)   Collection Time: 06/26/16  2:30 AM  Result Value Ref Range Status   Specimen Description BLOOD RIGHT HAND  Final   Special Requests IN PEDIATRIC BOTTLE Blood Culture adequate volume  Final   Culture NO GROWTH 1 DAY  Final   Report Status PENDING  Incomplete  MRSA PCR Screening     Status: None   Collection Time: 06/26/16  4:37 AM  Result Value Ref Range Status   MRSA by PCR NEGATIVE NEGATIVE Final    Comment:        The GeneXpert MRSA Assay (FDA approved for NASAL specimens only), is one component of a comprehensive MRSA colonization surveillance program. It is not intended to diagnose MRSA infection nor to guide or monitor treatment for MRSA infections.          Radiology  Studies: Dg Chest 2 View  Result Date: 06/25/2016 CLINICAL DATA:  Cough. Pt reports to the ED for eval of continued pulmonary congestion. Pt seemed confused, unable to answer hx questions. She did say she's had a productive cough, but was unsure what color her phlegm was. Hx of DM, HTN, thyroid disease. EXAM: CHEST  2 VIEW COMPARISON:  02/26/2015 chest x-ray and chest CT FINDINGS: The heart is mildly enlarged. There is new left upper Lower atelectasis. The right lung is clear. There is a small left pleural effusion. Surgical clips overlie  the left lateral chest wall. IMPRESSION: Left upper lobar atelectasis. Recommend CT of the chest with contrast to exclude obstructing central pulmonary mass. Electronically Signed   By: Nolon Nations M.D.   On: 06/25/2016 20:27   Ct Chest W Contrast  Result Date: 06/25/2016 CLINICAL DATA:  The patient has a cough. CT recommended to exclude a central obstructing mass on the left based on chest x-ray. EXAM: CT CHEST WITH CONTRAST TECHNIQUE: Multidetector CT imaging of the chest was performed during intravenous contrast administration. CONTRAST:  75 mL of Isovue 370 COMPARISON:  Chest x-ray from earlier today and CT scan February 26, 2015 FINDINGS: Cardiovascular: The heart is unchanged. Coronary artery calcifications are noted. The thoracic aorta is non aneurysmal. No dissection. The study was not tailored to evaluate for pulmonary emboli. No emboli are seen in the main pulmonary arteries. A linear filling defect is seen in the periphery of a left lower lobe pulmonary artery as seen on axial image 66 and coronal image 68. No emboli seen on the right. The volume loss on the left results in mild shift of the mediastinum to the left. Mediastinum/Nodes: The esophagus is normal. No adenopathy identified. Moderate left pleural effusion is seen. No pericardial effusion or right pleural effusion. Lungs/Pleura: The trachea and mainstem bronchi are normal. Opacification of right lower  lobe airways. There is significant atelectasis in the left upper lobe. No underlying cause is identified. No centrally obstructing masses noted. There is more mild atelectasis in the left base. There is a moderate left-sided pleural effusion. Dependent atelectasis seen on the right. No pneumothorax. No suspicious pulmonary nodule or mass is noted. Upper Abdomen: Renal cysts are identified. Increased enhancement in the hepatic dome as seen on coronal image 89, probably a hemangioma or perfusional variant. Another probable hemangioma is seen in the hepatic dome on image 100 as well. No other acute abnormalities in the upper abdomen. Musculoskeletal: No chest wall abnormality. No acute or significant osseous findings. IMPRESSION: 1. There is a linear filling defect in the periphery of a left lower lobe pulmonary artery. This is consistent with an age indeterminate pulmonary embolus. While indeterminate, this may not be acute. No other emboli identified. Recommend clinical correlation. 2. Significant atelectasis in the left upper lobe resulting in volume loss. No underlying cause identified. No central obstructing mass is noted. The underlying lung is not well assessed. Recommend short-term follow-up to ensure resolution. 3. Opacification of right lower lobe bronchi may represent mucus or aspirated material. No infiltrate. 4. Moderate left-sided pleural effusion. 5. No other acute abnormalities. These results will be called to the ordering clinician or representative by the Radiologist Assistant, and communication documented in the PACS or zVision Dashboard. Electronically Signed   By: Dorise Bullion III M.D   On: 06/25/2016 22:00        Scheduled Meds: . donepezil  5 mg Oral QHS  . feeding supplement (ENSURE ENLIVE)  1 Bottle Oral QID  . feeding supplement (PRO-STAT SUGAR FREE 64)  30 mL Oral TID WC  . ferrous sulfate  325 mg Oral BID WC  . levothyroxine  68 mcg Intravenous Daily  . mouth rinse  15 mL  Mouth Rinse BID  . metoprolol tartrate  2.5 mg Intravenous Q8H  . PARoxetine  40 mg Oral Daily  . rOPINIRole  3 mg Oral QHS  . sodium chloride flush  3 mL Intravenous Q12H   Continuous Infusions: . sodium chloride 100 mL/hr at 06/27/16 0541  . heparin 900 Units/hr (  06/27/16 1327)     LOS: 2 days    Time spent: > 35 minutes  Velvet Bathe, MD [facility] Pager [phone]  If 7PM-7AM, please contact night-coverage [email or web address] Password [email or web address] 06/27/2016, 2:21 PM

## 2016-06-27 NOTE — Progress Notes (Signed)
ANTICOAGULATION CONSULT NOTE  Pharmacy Consult for heparin  Indication: pulmonary embolus  Patient Measurements: Heparin Dosing Weight: 61 kg   Vital Signs: Temp: 98.8 F (37.1 C) (06/09 0402) Temp Source: Axillary (06/09 0402) BP: 152/80 (06/09 0402) Pulse Rate: 71 (06/09 0402)  Labs:  Recent Labs  06/25/16 2000 06/26/16 0105 06/26/16 0726 06/26/16 1751 06/27/16 0331  HGB 9.7* 10.1*  --   --  9.8*  HCT 31.5* 32.3*  --   --  31.1*  PLT 303 285  --   --  344  HEPARINUNFRC  --   --  0.20* 0.56 0.82*  CREATININE 1.40* 1.36*  --   --   --     Estimated Creatinine Clearance: 30.5 mL/min (A) (by C-G formula based on SCr of 1.36 mg/dL (H)).  Assessment: 83 yo female with acute vs chronic PE for heparin  Goal of Therapy:  Heparin level 0.3-0.7 units/ml Monitor platelets by anticoagulation protocol: Yes   Plan:  Decrease Heparin 850 units/hr Check heparin level in 8 hours.   Phillis Knack, PharmD, BCPS  06/27/16 4:54 AM

## 2016-06-27 NOTE — Progress Notes (Signed)
ANTICOAGULATION CONSULT NOTE - Follow Up Consult  Pharmacy Consult for Heparin Indication: pulmonary embolus  Patient Measurements: Height: 5\' 7"  (170.2 cm) Weight: 138 lb (62.6 kg) IBW/kg (Calculated) : 61.6 Heparin Dosing Weight:  62.6 kg  Vital Signs: Temp: 98.1 F (36.7 C) (06/09 2100) Temp Source: Axillary (06/09 2100) BP: 145/75 (06/09 2100) Pulse Rate: 87 (06/09 2100)  Labs:  Recent Labs  06/25/16 2000 06/26/16 0105  06/27/16 0331 06/27/16 1139 06/27/16 2046  HGB 9.7* 10.1*  --  9.8*  --   --   HCT 31.5* 32.3*  --  31.1*  --   --   PLT 303 285  --  344  --   --   HEPARINUNFRC  --   --   < > 0.82* 0.30 0.38  CREATININE 1.40* 1.36*  --   --   --   --   < > = values in this interval not displayed.  Estimated Creatinine Clearance: 30.5 mL/min (A) (by C-G formula based on SCr of 1.36 mg/dL (H)).  Assessment: 83 yo female found to have PE.  No oral anticoagulation PTA (plavix on PTA list). Hgb 9.8 and platelets within normal limits. No overt s/s bleeding noted.   Heparin level: 0.38  Goal of Therapy:  Heparin level 0.3-0.7 units/ml Monitor platelets by anticoagulation protocol: Yes   Plan:  1. Continue heparin gtt at 900 units/hr 2. Daily heparin level/CBC  Vincenza Hews, PharmD, BCPS 06/27/2016, 10:00 PM

## 2016-06-27 NOTE — Progress Notes (Addendum)
ANTICOAGULATION CONSULT NOTE - Follow Up Consult  Pharmacy Consult for Heparin Indication: pulmonary embolus  Patient Measurements: Height: 5\' 7"  (170.2 cm) Weight: 138 lb (62.6 kg) IBW/kg (Calculated) : 61.6 Heparin Dosing Weight:  62.6 kg  Vital Signs: Temp: 98.8 F (37.1 C) (06/09 0402) Temp Source: Axillary (06/09 0402) BP: 152/80 (06/09 0402) Pulse Rate: 71 (06/09 0402)  Labs:  Recent Labs  06/25/16 2000 06/26/16 0105  06/26/16 1751 06/27/16 0331 06/27/16 1139  HGB 9.7* 10.1*  --   --  9.8*  --   HCT 31.5* 32.3*  --   --  31.1*  --   PLT 303 285  --   --  344  --   HEPARINUNFRC  --   --   < > 0.56 0.82* 0.30  CREATININE 1.40* 1.36*  --   --   --   --   < > = values in this interval not displayed.  Estimated Creatinine Clearance: 30.5 mL/min (A) (by C-G formula based on SCr of 1.36 mg/dL (H)).  Assessment: 83 yo female found to have PE.  No oral anticoagulation PTA (plavix on PTA list). Hgb 9.8 and platelets within normal limits. No overt s/s bleeding noted.   Heparin level: 0.30  Goal of Therapy:  Heparin level 0.3-0.7 units/ml Monitor platelets by anticoagulation protocol: Yes   Plan:  Increase heparin gtt to 900 units/hr Check heparin level in 8 hours.  Daily heparin level/CBC  Georga Bora, PharmD Clinical Pharmacist 06/27/2016 12:50 PM

## 2016-06-28 LAB — CBC
HCT: 32 % — ABNORMAL LOW (ref 36.0–46.0)
Hemoglobin: 10 g/dL — ABNORMAL LOW (ref 12.0–15.0)
MCH: 23.8 pg — AB (ref 26.0–34.0)
MCHC: 31.3 g/dL (ref 30.0–36.0)
MCV: 76 fL — ABNORMAL LOW (ref 78.0–100.0)
PLATELETS: 279 10*3/uL (ref 150–400)
RBC: 4.21 MIL/uL (ref 3.87–5.11)
RDW: 15.5 % (ref 11.5–15.5)
WBC: 8 10*3/uL (ref 4.0–10.5)

## 2016-06-28 LAB — BASIC METABOLIC PANEL
ANION GAP: 7 (ref 5–15)
BUN: 8 mg/dL (ref 6–20)
CALCIUM: 8.6 mg/dL — AB (ref 8.9–10.3)
CO2: 28 mmol/L (ref 22–32)
Chloride: 106 mmol/L (ref 101–111)
Creatinine, Ser: 1.08 mg/dL — ABNORMAL HIGH (ref 0.44–1.00)
GFR, EST AFRICAN AMERICAN: 53 mL/min — AB (ref >60)
GFR, EST NON AFRICAN AMERICAN: 46 mL/min — AB (ref >60)
GLUCOSE: 87 mg/dL (ref 65–99)
POTASSIUM: 3.6 mmol/L (ref 3.5–5.1)
Sodium: 141 mmol/L (ref 135–145)

## 2016-06-28 LAB — HEPARIN LEVEL (UNFRACTIONATED): Heparin Unfractionated: 0.34 IU/mL (ref 0.30–0.70)

## 2016-06-28 MED ORDER — RIVAROXABAN 20 MG PO TABS: 20.0000 mg | ORAL_TABLET | Freq: Every day | ORAL | Status: DC

## 2016-06-28 MED ORDER — RIVAROXABAN 15 MG PO TABS
15.0000 mg | ORAL_TABLET | Freq: Two times a day (BID) | ORAL | Status: DC
  Administered 2016-06-28 – 2016-06-29 (×2): 15 mg via ORAL
  Filled 2016-06-28 (×2): qty 1

## 2016-06-28 MED ORDER — LEVOTHYROXINE SODIUM 25 MCG PO TABS
137.0000 ug | ORAL_TABLET | Freq: Every day | ORAL | Status: DC
  Administered 2016-06-29: 137 ug via ORAL
  Filled 2016-06-28: qty 1

## 2016-06-28 NOTE — Progress Notes (Signed)
PROGRESS NOTE    [name]  AGT:[id number] DOB: 1930/02/08 DOA: 06/25/2016 PCP: Gildardo Cranker, DO    Brief Narrative:  Patient is an 83 year old with history of dementia who presented with PE.  Assessment & Plan:   Principal Problem:   PE (pulmonary thromboembolism) (HCC) - Echocardiogram reviewed and reporting normal EF, with pulmonic valve transvalvular velocity within normal range. - transition to NOAC. Consulted care management for assistance. Discussed with daughter. - Family would like patient monitored overnight on xarelto to ensure patient does not have any side effects to the oral anticoagulant  Active Problems:   Essential hypertension   CKD (chronic kidney disease), stage III - stable    Hypokalemia - replace orally and reassess    Decubitus ulcer of left heel, unstageable (HCC)   Dementia without behavioral disturbance - Family is reporting that confusion was worse today. Daughter disagrees however and would like to only minimally decrease opioid pain medication as she does not want her mother and pain    Anemia in chronic renal disease   Diabetes mellitus with renal manifestations, controlled ([place]) - most likely diet controlled, blood sugars have fluctuated from 70-155 with the most recent ones closer to 100    Hypothyroidism due to acquired atrophy of thyroid - continue synthroid    Moderate protein-calorie malnutrition ([place]) - continue nutritional supplementation   DVT prophylaxis: heparin gtt Code Status: Full Family Communication: d/c daughter Disposition Plan: d/c next am if patient tolerates NOAC   Consultants:   none   Procedures: echo: 65-70% with normal wall motion    Antimicrobials: none   Subjective: Pt has no new complaints. Reported breathing condition not at baseline.  Objective: Vitals:   06/27/16 1347 06/27/16 2100 06/28/16 0500 06/28/16 1352  BP: (!) 158/51 (!) 145/75 (!) 157/56 (!) 152/48  Pulse: 68 87 69 74  Resp:  20 18 16 16   Temp: 98.6 F (37 C) 98.1 F (36.7 C) 97.8 F (36.6 C) 98 F (36.7 C)  TempSrc: Oral Axillary Axillary Axillary  SpO2: 97% 99% 100% 98%  Weight:      Height:        Intake/Output Summary (Last 24 hours) at 06/28/16 1546 Last data filed at 06/28/16 1300  Gross per 24 hour  Intake          1643.77 ml  Output             2100 ml  Net          -456.23 ml   Filed Weights   06/26/16 0045  Weight: 62.6 kg (138 lb)    Examination:  General exam: Appears calm and comfortable  Respiratory system: Clear to auscultation. Respiratory effort normal. Cardiovascular system: S1 & S2 heard, RRR. No JVD, murmurs, rubs, gallops or clicks. No pedal edema. Gastrointestinal system: Abdomen is nondistended, soft and nontender. No organomegaly or masses felt. Normal bowel sounds heard. Central nervous system: Alert and oriented. No focal neurological deficits. Extremities: Symmetric 5 x 5 power. Skin: No rashes, lesions or ulcers Psychiatry: Judgement and insight appear normal. Mood & affect appropriate.   Data Reviewed: I have personally reviewed following labs and imaging studies  CBC:  Recent Labs Lab 06/23/16 06/25/16 2000 06/26/16 0105 06/27/16 0331 06/28/16 0356  WBC 5.0 6.7 7.3 8.7 8.0  NEUTROABS 3 4.6  --   --   --   HGB 10.4* 9.7* 10.1* 9.8* 10.0*  HCT 33* 31.5* 32.3* 31.1* 32.0*  MCV  --  75.9* 75.6* 75.9* 76.0*  PLT 182 303 285 344 035   Basic Metabolic Panel:  Recent Labs Lab 06/23/16 06/25/16 2000 06/25/16 2349 06/26/16 0105 06/28/16 0356  NA 141 144  --  138 141  K 3.6 2.9*  --  3.3* 3.6  CL  --  107  --  103 106  CO2  --  28  --  26 28  GLUCOSE  --  155*  --  116* 87  BUN 20 21*  --  20 8  CREATININE 1.1 1.40*  --  1.36* 1.08*  CALCIUM  --  8.2*  --  8.0* 8.6*  MG  --   --  1.6* 1.6*  --    GFR: Estimated Creatinine Clearance: 38.4 mL/min (A) (by C-G formula based on SCr of 1.08 mg/dL (H)). Liver Function Tests:  Recent Labs Lab 06/23/16    AST 18  ALT 8  ALKPHOS 76   No results for input(s): LIPASE, AMYLASE in the last 168 hours. No results for input(s): AMMONIA in the last 168 hours. Coagulation Profile: No results for input(s): INR, PROTIME in the last 168 hours. Cardiac Enzymes: No results for input(s): CKTOTAL, CKMB, CKMBINDEX, TROPONINI in the last 168 hours. BNP (last 3 results) No results for input(s): PROBNP in the last 8760 hours. HbA1C: No results for input(s): HGBA1C in the last 72 hours. CBG: No results for input(s): GLUCAP in the last 168 hours. Lipid Profile: No results for input(s): CHOL, HDL, LDLCALC, TRIG, CHOLHDL, LDLDIRECT in the last 72 hours. Thyroid Function Tests: No results for input(s): TSH, T4TOTAL, FREET4, T3FREE, THYROIDAB in the last 72 hours. Anemia Panel: No results for input(s): VITAMINB12, FOLATE, FERRITIN, TIBC, IRON, RETICCTPCT in the last 72 hours. Sepsis Labs: No results for input(s): PROCALCITON, LATICACIDVEN in the last 168 hours.  Recent Results (from the past 240 hour(s))  Culture, blood (routine x 2)     Status: None (Preliminary result)   Collection Time: 06/25/16  5:04 AM  Result Value Ref Range Status   Specimen Description BLOOD LEFT HAND  Final   Special Requests IN PEDIATRIC BOTTLE Blood Culture adequate volume  Final   Culture NO GROWTH 2 DAYS  Final   Report Status PENDING  Incomplete  Culture, blood (routine x 2)     Status: None (Preliminary result)   Collection Time: 06/26/16  2:30 AM  Result Value Ref Range Status   Specimen Description BLOOD RIGHT HAND  Final   Special Requests IN PEDIATRIC BOTTLE Blood Culture adequate volume  Final   Culture NO GROWTH 2 DAYS  Final   Report Status PENDING  Incomplete  MRSA PCR Screening     Status: None   Collection Time: 06/26/16  4:37 AM  Result Value Ref Range Status   MRSA by PCR NEGATIVE NEGATIVE Final    Comment:        The GeneXpert MRSA Assay (FDA approved for NASAL specimens only), is one component of  a comprehensive MRSA colonization surveillance program. It is not intended to diagnose MRSA infection nor to guide or monitor treatment for MRSA infections.          Radiology Studies: No results found.      Scheduled Meds: . donepezil  5 mg Oral QHS  . feeding supplement (ENSURE ENLIVE)  1 Bottle Oral QID  . feeding supplement (PRO-STAT SUGAR FREE 64)  30 mL Oral TID WC  . ferrous sulfate  325 mg Oral BID WC  . [START ON 06/29/2016] levothyroxine  137 mcg Oral  QAC breakfast  . mouth rinse  15 mL Mouth Rinse BID  . metoprolol tartrate  2.5 mg Intravenous Q8H  . PARoxetine  40 mg Oral Daily  . Rivaroxaban  15 mg Oral BID WC  . [START ON 07/20/2016] rivaroxaban  20 mg Oral Q supper  . rOPINIRole  3 mg Oral QHS  . sodium chloride flush  3 mL Intravenous Q12H   Continuous Infusions: . sodium chloride 50 mL/hr at 06/27/16 1400  . heparin 900 Units/hr (06/27/16 2330)     LOS: 3 days    Time spent: > 35 minutes  Velvet Bathe, MD [facility] Pager [phone]  If 7PM-7AM, please contact night-coverage [email or web address] Password [facility] 06/28/2016, 3:46 PM

## 2016-06-28 NOTE — Progress Notes (Addendum)
ANTICOAGULATION CONSULT NOTE - Follow Up Consult  Pharmacy Consult for Heparin Indication: pulmonary embolus  Patient Measurements: Height: 5\' 7"  (170.2 cm) Weight: 138 lb (62.6 kg) IBW/kg (Calculated) : 61.6 Heparin Dosing Weight:  62.6 kg  Vital Signs: Temp: 97.8 F (36.6 C) (06/10 0500) Temp Source: Axillary (06/10 0500) BP: 157/56 (06/10 0500) Pulse Rate: 69 (06/10 0500)  Labs:  Recent Labs  06/25/16 2000 06/26/16 0105  06/27/16 0331 06/27/16 1139 06/27/16 2046 06/28/16 0356  HGB 9.7* 10.1*  --  9.8*  --   --  10.0*  HCT 31.5* 32.3*  --  31.1*  --   --  32.0*  PLT 303 285  --  344  --   --  279  HEPARINUNFRC  --   --   < > 0.82* 0.30 0.38 0.34  CREATININE 1.40* 1.36*  --   --   --   --  1.08*  < > = values in this interval not displayed.  Estimated Creatinine Clearance: 38.4 mL/min (A) (by C-G formula based on SCr of 1.08 mg/dL (H)).  Assessment: 83 yo female found to have PE.  No oral anticoagulation PTA (plavix on PTA list). CBC stable; Hgb 10.0 and platelets within normal limits. No overt s/s bleeding noted. At lower end of goal, will increase slightly to prevent subtherapeutic level.   Heparin level: 0.34  Goal of Therapy:  Heparin level 0.3-0.7 units/ml Monitor platelets by anticoagulation protocol: Yes   Plan:  Increase heparin gtt to 950 units/hr  Daily heparin level/CBC Monitor for s/sx of bleeding  Georga Bora, PharmD Clinical Pharmacist 06/28/2016 11:50 AM  _____________________________________________________________________________ Addendum  Patient will be transitioned to oral anticoagulation later today. Heparin infusion will be stopped when first dose of Xarelto is administered.   Start Xarelto 15mg  BID x21 days tonight @1630  D/C heparin gtt @ 1630 Monitor renal function and for s/sx of bleeding  Georga Bora, PharmD Clinical Pharmacist 06/28/2016 1:57 PM

## 2016-06-29 DIAGNOSIS — L89620 Pressure ulcer of left heel, unstageable: Secondary | ICD-10-CM | POA: Diagnosis not present

## 2016-06-29 DIAGNOSIS — E46 Unspecified protein-calorie malnutrition: Secondary | ICD-10-CM | POA: Diagnosis not present

## 2016-06-29 DIAGNOSIS — L89610 Pressure ulcer of right heel, unstageable: Secondary | ICD-10-CM | POA: Diagnosis not present

## 2016-06-29 DIAGNOSIS — E113553 Type 2 diabetes mellitus with stable proliferative diabetic retinopathy, bilateral: Secondary | ICD-10-CM | POA: Diagnosis not present

## 2016-06-29 DIAGNOSIS — I2699 Other pulmonary embolism without acute cor pulmonale: Secondary | ICD-10-CM | POA: Diagnosis not present

## 2016-06-29 DIAGNOSIS — N183 Chronic kidney disease, stage 3 (moderate): Secondary | ICD-10-CM | POA: Diagnosis not present

## 2016-06-29 DIAGNOSIS — Z7902 Long term (current) use of antithrombotics/antiplatelets: Secondary | ICD-10-CM | POA: Diagnosis not present

## 2016-06-29 DIAGNOSIS — Z79899 Other long term (current) drug therapy: Secondary | ICD-10-CM | POA: Diagnosis not present

## 2016-06-29 DIAGNOSIS — H1132 Conjunctival hemorrhage, left eye: Secondary | ICD-10-CM | POA: Diagnosis not present

## 2016-06-29 DIAGNOSIS — N390 Urinary tract infection, site not specified: Secondary | ICD-10-CM | POA: Diagnosis not present

## 2016-06-29 DIAGNOSIS — E162 Hypoglycemia, unspecified: Secondary | ICD-10-CM | POA: Diagnosis not present

## 2016-06-29 DIAGNOSIS — L89623 Pressure ulcer of left heel, stage 3: Secondary | ICD-10-CM | POA: Diagnosis not present

## 2016-06-29 DIAGNOSIS — E1122 Type 2 diabetes mellitus with diabetic chronic kidney disease: Secondary | ICD-10-CM | POA: Diagnosis not present

## 2016-06-29 DIAGNOSIS — D649 Anemia, unspecified: Secondary | ICD-10-CM | POA: Diagnosis not present

## 2016-06-29 DIAGNOSIS — F0150 Vascular dementia without behavioral disturbance: Secondary | ICD-10-CM | POA: Diagnosis not present

## 2016-06-29 DIAGNOSIS — R1312 Dysphagia, oropharyngeal phase: Secondary | ICD-10-CM | POA: Diagnosis not present

## 2016-06-29 DIAGNOSIS — F0390 Unspecified dementia without behavioral disturbance: Secondary | ICD-10-CM | POA: Diagnosis not present

## 2016-06-29 DIAGNOSIS — E559 Vitamin D deficiency, unspecified: Secondary | ICD-10-CM | POA: Diagnosis not present

## 2016-06-29 DIAGNOSIS — I469 Cardiac arrest, cause unspecified: Secondary | ICD-10-CM | POA: Diagnosis present

## 2016-06-29 DIAGNOSIS — R41841 Cognitive communication deficit: Secondary | ICD-10-CM | POA: Diagnosis not present

## 2016-06-29 DIAGNOSIS — M6281 Muscle weakness (generalized): Secondary | ICD-10-CM | POA: Diagnosis not present

## 2016-06-29 DIAGNOSIS — Z86711 Personal history of pulmonary embolism: Secondary | ICD-10-CM | POA: Diagnosis not present

## 2016-06-29 DIAGNOSIS — R4182 Altered mental status, unspecified: Secondary | ICD-10-CM | POA: Diagnosis not present

## 2016-06-29 DIAGNOSIS — E876 Hypokalemia: Secondary | ICD-10-CM | POA: Diagnosis not present

## 2016-06-29 DIAGNOSIS — J189 Pneumonia, unspecified organism: Secondary | ICD-10-CM | POA: Diagnosis not present

## 2016-06-29 DIAGNOSIS — F329 Major depressive disorder, single episode, unspecified: Secondary | ICD-10-CM | POA: Diagnosis not present

## 2016-06-29 DIAGNOSIS — M1990 Unspecified osteoarthritis, unspecified site: Secondary | ICD-10-CM | POA: Diagnosis not present

## 2016-06-29 DIAGNOSIS — I10 Essential (primary) hypertension: Secondary | ICD-10-CM | POA: Diagnosis not present

## 2016-06-29 DIAGNOSIS — L97522 Non-pressure chronic ulcer of other part of left foot with fat layer exposed: Secondary | ICD-10-CM | POA: Diagnosis not present

## 2016-06-29 DIAGNOSIS — I639 Cerebral infarction, unspecified: Secondary | ICD-10-CM | POA: Diagnosis not present

## 2016-06-29 DIAGNOSIS — F419 Anxiety disorder, unspecified: Secondary | ICD-10-CM | POA: Diagnosis not present

## 2016-06-29 DIAGNOSIS — D631 Anemia in chronic kidney disease: Secondary | ICD-10-CM | POA: Diagnosis not present

## 2016-06-29 DIAGNOSIS — F339 Major depressive disorder, recurrent, unspecified: Secondary | ICD-10-CM | POA: Diagnosis not present

## 2016-06-29 DIAGNOSIS — I69354 Hemiplegia and hemiparesis following cerebral infarction affecting left non-dominant side: Secondary | ICD-10-CM | POA: Diagnosis not present

## 2016-06-29 DIAGNOSIS — R293 Abnormal posture: Secondary | ICD-10-CM | POA: Diagnosis not present

## 2016-06-29 DIAGNOSIS — I69859 Hemiplegia and hemiparesis following other cerebrovascular disease affecting unspecified side: Secondary | ICD-10-CM | POA: Diagnosis not present

## 2016-06-29 DIAGNOSIS — J90 Pleural effusion, not elsewhere classified: Secondary | ICD-10-CM | POA: Diagnosis not present

## 2016-06-29 DIAGNOSIS — F0391 Unspecified dementia with behavioral disturbance: Secondary | ICD-10-CM | POA: Diagnosis not present

## 2016-06-29 DIAGNOSIS — E039 Hypothyroidism, unspecified: Secondary | ICD-10-CM | POA: Diagnosis not present

## 2016-06-29 DIAGNOSIS — Z853 Personal history of malignant neoplasm of breast: Secondary | ICD-10-CM | POA: Diagnosis not present

## 2016-06-29 DIAGNOSIS — F0281 Dementia in other diseases classified elsewhere with behavioral disturbance: Secondary | ICD-10-CM | POA: Diagnosis not present

## 2016-06-29 DIAGNOSIS — E119 Type 2 diabetes mellitus without complications: Secondary | ICD-10-CM | POA: Diagnosis not present

## 2016-06-29 DIAGNOSIS — I129 Hypertensive chronic kidney disease with stage 1 through stage 4 chronic kidney disease, or unspecified chronic kidney disease: Secondary | ICD-10-CM | POA: Diagnosis not present

## 2016-06-29 DIAGNOSIS — E034 Atrophy of thyroid (acquired): Secondary | ICD-10-CM | POA: Diagnosis not present

## 2016-06-29 DIAGNOSIS — R319 Hematuria, unspecified: Secondary | ICD-10-CM | POA: Diagnosis not present

## 2016-06-29 DIAGNOSIS — G2581 Restless legs syndrome: Secondary | ICD-10-CM | POA: Diagnosis not present

## 2016-06-29 DIAGNOSIS — Z7901 Long term (current) use of anticoagulants: Secondary | ICD-10-CM | POA: Diagnosis not present

## 2016-06-29 LAB — CBC
HCT: 30.3 % — ABNORMAL LOW (ref 36.0–46.0)
Hemoglobin: 9.6 g/dL — ABNORMAL LOW (ref 12.0–15.0)
MCH: 23.8 pg — ABNORMAL LOW (ref 26.0–34.0)
MCHC: 31.7 g/dL (ref 30.0–36.0)
MCV: 75 fL — ABNORMAL LOW (ref 78.0–100.0)
PLATELETS: 274 10*3/uL (ref 150–400)
RBC: 4.04 MIL/uL (ref 3.87–5.11)
RDW: 15 % (ref 11.5–15.5)
WBC: 8.1 10*3/uL (ref 4.0–10.5)

## 2016-06-29 LAB — HEPARIN LEVEL (UNFRACTIONATED): HEPARIN UNFRACTIONATED: 1.98 [IU]/mL — AB (ref 0.30–0.70)

## 2016-06-29 MED ORDER — RIVAROXABAN (XARELTO) VTE STARTER PACK (15 & 20 MG): ORAL_TABLET | ORAL | 0 refills | Status: AC

## 2016-06-29 NOTE — NC FL2 (Signed)
[place] LEVEL OF CARE SCREENING TOOL     IDENTIFICATION  Patient Name: [name] Birthdate: [birth year] Sex: female Admission Date (Current Location): 06/25/2016  [facility] and [place] Number:  Herbalist and Address:  [facility] [place], [place] [address], [place], [place] [address]      Provider Number: [id number]  Attending Physician Name and Address:  Velvet Bathe, MD  Relative Name and Phone Number:  [phone]    Current Level of Care: Hospital Recommended Level of Care: [place] Prior Approval Number:    Date Approved/Denied:   PASRR Number: [id number]  Discharge Plan: SNF    Current Diagnoses: Patient Active Problem List   Diagnosis Date Noted  . Moderate protein-calorie malnutrition ([place]) 06/26/2016  . PE (pulmonary thromboembolism) ([place]) 06/25/2016  . Dysphagia, pharyngoesophageal phase 12/28/2015  . Hypothyroidism due to acquired atrophy of thyroid 08/07/2015  . Diabetes mellitus with renal manifestations, controlled ([place]) 12/13/2014  . Hemiparesis affecting left side as late effect of cerebrovascular accident ([place]) 12/13/2014  . Loss of weight 12/13/2014  . Anemia in chronic renal disease 11/10/2014  . Dementia without behavioral disturbance 09/24/2014  . Vitamin D deficiency 09/24/2014  . Decubitus ulcer of left heel, unstageable ([place]) 09/17/2014  . Hypokalemia 09/16/2014  . Cerebral thrombosis with cerebral infarction ([place]) 07/06/2014  . Constipation 03/29/2014  . CKD (chronic kidney disease), stage III 11/08/2013  . Breast cancer, left breast ([place]) 10/07/2012  . Essential hypertension 08/29/2012  . Anemia 01/09/2011    Orientation RESPIRATION BLADDER Height & Weight     Self  Normal Incontinent Weight: 138 lb (62.6 kg) Height:  5\' 7"  (170.2 cm)  BEHAVIORAL SYMPTOMS/MOOD NEUROLOGICAL BOWEL NUTRITION STATUS      Incontinent (S) Diet (See DC Summary)  AMBULATORY STATUS COMMUNICATION  OF NEEDS Skin   Limited Assist   Normal                       Personal Care Assistance Level of Assistance  Bathing, Dressing Bathing Assistance: Limited assistance   Dressing Assistance: Limited assistance     Functional Limitations Info  Speech, Hearing, Sight Sight Info: Adequate Hearing Info: Adequate Speech Info: Adequate    SPECIAL CARE FACTORS FREQUENCY  PT (By licensed PT), OT (By licensed OT)     PT Frequency: 5x wk OT Frequency: 5x wk            Contractures Contractures Info: Not present    Additional Factors Info  Code Status, Allergies Code Status Info: Full Code Allergies Info: Aspirin, Penicillins           Current Medications (06/29/2016):  This is the current hospital active medication list Current Facility-Administered Medications  Medication Dose Route Frequency Provider Last Rate Last Dose  . 0.9 %  sodium chloride infusion   Intravenous Continuous Velvet Bathe, MD 50 mL/hr at 06/27/16 1400    . acetaminophen (TYLENOL) tablet 650 mg  650 mg Oral Q6H PRN Danford, Suann Larry, MD       Or  . acetaminophen (TYLENOL) suppository 650 mg  650 mg Rectal Q6H PRN Danford, Suann Larry, MD      . donepezil (ARICEPT) tablet 5 mg  5 mg Oral QHS Danford, Suann Larry, MD   5 mg at 06/28/16 2216  . feeding supplement (ENSURE ENLIVE) (ENSURE ENLIVE) liquid 237 mL  1 Bottle Oral QID Edwin Dada, MD   237 mL at 06/27/16 1400  .  feeding supplement (PRO-STAT SUGAR FREE 64) liquid 30 mL  30 mL Oral TID WC Danford, Suann Larry, MD   30 mL at 06/28/16 1130  . ferrous sulfate tablet 325 mg  325 mg Oral BID WC Danford, Suann Larry, MD   325 mg at 06/29/16 1038  . HYDROcodone-acetaminophen (NORCO/VICODIN) 5-325 MG per tablet 1 tablet  1 tablet Oral Q12H PRN Velvet Bathe, MD      . levothyroxine (SYNTHROID, LEVOTHROID) tablet 137 mcg  137 mcg Oral QAC breakfast Velvet Bathe, MD   137 mcg at 06/29/16 [phone]  . LORazepam (ATIVAN) tablet 0.5 mg  0.5  mg Oral Daily PRN Velvet Bathe, MD   0.5 mg at 06/29/16 0342  . MEDLINE mouth rinse  15 mL Mouth Rinse BID Danford, Suann Larry, MD   15 mL at 06/29/16 1051  . metoprolol tartrate (LOPRESSOR) injection 2.5 mg  2.5 mg Intravenous Q8H Rise Patience, MD   2.5 mg at 06/29/16 0512  . PARoxetine (PAXIL) tablet 40 mg  40 mg Oral Daily Danford, Suann Larry, MD   40 mg at 06/29/16 1039  . [place]   Oral PRN Velvet Bathe, MD      . Rivaroxaban (XARELTO) tablet 15 mg  15 mg Oral BID WC Velvet Bathe, MD   15 mg at 06/29/16 1038  . [START ON 07/20/2016] rivaroxaban (XARELTO) tablet 20 mg  20 mg Oral Q supper Velvet Bathe, MD      . rOPINIRole (REQUIP) tablet 3 mg  3 mg Oral QHS Danford, Suann Larry, MD   3 mg at 06/28/16 2216  . sodium chloride flush (NS) 0.9 % injection 3 mL  3 mL Intravenous Q12H Danford, Suann Larry, MD   3 mL at 06/27/16 2133     Discharge Medications: Please see discharge summary for a list of discharge medications.  Relevant Imaging Results:  Relevant Lab Results:   Additional Information [place], Shamar Kracke B, LCSWA

## 2016-06-29 NOTE — Clinical Social Work Note (Signed)
Clinical Social Worker facilitated patient discharge including contacting patient family (Dtr) and facility ([facility]) to confirm patient discharge plans. Clinical information will be faxed to facility and family agreeable with plan. CSW will arrange ambulance transport via [facility] to [facility]. RN to call report to [phone] prior to discharge.  Clinical Social Worker will sign off for now as social work intervention is no longer needed. Please consult [place] again if new need arises.  Khya Halls B. Joline Maxcy Clinical Social Work Dept Weekend Social Worker [phone] 10:48 AM

## 2016-06-29 NOTE — Care Management Note (Addendum)
Case Management Note Marvetta Gibbons RN, BSN Unit 2W-Case Manager [phone]  Patient Details  Name: [name] MRN: [id number] Date of Birth: [birth year]  Subjective/Objective:  Pt admitted for PE                   Action/Plan: PTA pt was at [facility] following for return to SNF when medically stable-- insurance check completed for [place]- pt has zero dollar copay for either Eliquis or Xarelto  Expected Discharge Date:  06/29/16               Expected Discharge Plan:  [place]  In-House Referral:  Clinical Social Work  Discharge planning Services  CM Consult, Medication Assistance  Post Acute Care Choice:    Choice offered to:     DME Arranged:    DME Agency:     HH Arranged:    [place] Agency:     Status of Service:  Completed, signed off  If discussed at [employer], dates discussed:    Discharge Disposition: skilled facility   Additional Comments:  06/29/16- 1350- Jaedyn Lard RN, CM- pt for d/c back to SNF today- CSW following for return to [facility].  Pt started on Xarelto  :Dawayne Patricia, RN 06/29/2016, 1:55 PM

## 2016-06-29 NOTE — Discharge Instructions (Signed)
Information on my medicine - XARELTO (rivaroxaban)  This medication education was reviewed with me or my healthcare representative as part of my discharge preparation.    WHY WAS XARELTO PRESCRIBED FOR YOU? Xarelto was prescribed to treat blood clots that may have been found in the veins of your legs (deep vein thrombosis) or in your lungs (pulmonary embolism) and to reduce the risk of them occurring again.  What do you need to know about Xarelto? The starting dose is one 15 mg tablet taken TWICE daily with food for the FIRST 21 DAYS then on July 20, 2016 the dose is changed to one 20 mg tablet taken ONCE A DAY with your evening meal.  DO NOT stop taking Xarelto without talking to the health care provider who prescribed the medication.  Refill your prescription for 20 mg tablets before you run out.  After discharge, you should have regular check-up appointments with your healthcare provider that is prescribing your Xarelto.  In the future your dose may need to be changed if your kidney function changes by a significant amount.  What do you do if you miss a dose? If you are taking Xarelto TWICE DAILY and you miss a dose, take it as soon as you remember. You may take two 15 mg tablets (total 30 mg) at the same time then resume your regularly scheduled 15 mg twice daily the next day.  If you are taking Xarelto ONCE DAILY and you miss a dose, take it as soon as you remember on the same day then continue your regularly scheduled once daily regimen the next day. Do not take two doses of Xarelto at the same time.   Important Safety Information Xarelto is a blood thinner medicine that can cause bleeding. You should call your healthcare provider right away if you experience any of the following: ? Bleeding from an injury or your nose that does not stop. ? Unusual colored urine (red or dark brown) or unusual colored stools (red or black). ? Unusual bruising for unknown reasons. ? A serious  fall or if you hit your head (even if there is no bleeding).  Some medicines may interact with Xarelto and might increase your risk of bleeding while on Xarelto. To help avoid this, consult your healthcare provider or pharmacist prior to using any new prescription or non-prescription medications, including herbals, vitamins, non-steroidal anti-inflammatory drugs (NSAIDs) and supplements.  This website has more information on Xarelto: [email or web address]

## 2016-06-29 NOTE — Clinical Social Work Placement (Signed)
   CLINICAL SOCIAL WORK PLACEMENT  NOTE  Date:  06/29/2016  Patient Details  Name: [name] MRN: [id number] Date of Birth: Sep 20, 1930  Clinical Social Work is seeking post-discharge placement for this patient at the [place] level of care (*CSW will initial, date and re-position this form in  chart as items are completed):  Yes   Patient/family provided with [place] Work Department's list of facilities offering this level of care within the geographic area requested by the patient (or if unable, by the patient's family).  Yes   Patient/family informed of their freedom to choose among providers that offer the needed level of care, that participate in Medicare, Medicaid or managed care program needed by the patient, have an available bed and are willing to accept the patient.  Yes   Patient/family informed of [facility]'s ownership interest in [facility] and [facility], as well as of the fact that they are under no obligation to receive care at these facilities.  PASRR submitted to EDS on       PASRR number received on       Existing PASRR number confirmed on 06/29/16     FL2 transmitted to all facilities in geographic area requested by pt/family on       FL2 transmitted to all facilities within larger geographic area on       Patient informed that his/her managed care company has contracts with or will negotiate with certain facilities, including the following:            Patient/family informed of bed offers received.  Patient chooses bed at  (Pt is a resident at [facility])     Physician recommends and patient chooses bed at      Patient to be transferred to  (Pt is a resident at [facility]) on 06/29/16.  Patient to be transferred to facility by  Corey Harold)     Patient family notified on 06/29/16 of transfer.  Name of family member notified:  Dtr     PHYSICIAN       Additional Comment:     _______________________________________________ Serafina Mitchell, [place] 06/29/2016, 10:53 AM

## 2016-06-29 NOTE — Discharge Summary (Signed)
Physician Discharge Summary  [name] JTT:[id number] DOB: 15-Jun-1930 DOA: 06/25/2016  PCP: Gildardo Cranker, DO  Admit date: 06/25/2016 Discharge date: 06/29/2016  Time spent: > 35 minutes  Recommendations for Outpatient Follow-up:  1. Monitor hemoglobin levels 2. Patient started on new anticoagulant.  3. Patient had amlodipine and lisinopril held please decide when to continue if appropriate    Discharge Diagnoses:  Principal Problem:   PE (pulmonary thromboembolism) ([place]) Active Problems:   Essential hypertension   CKD (chronic kidney disease), stage III   Hypokalemia   Decubitus ulcer of left heel, unstageable (HCC)   Dementia without behavioral disturbance   Anemia in chronic renal disease   Diabetes mellitus with renal manifestations, controlled ([place])   Hypothyroidism due to acquired atrophy of thyroid   Moderate protein-calorie malnutrition ([place])   Discharge Condition: Stable  Diet recommendation: Dysphagia 2 diabetic diet  Filed Weights   06/26/16 0045  Weight: 62.6 kg (138 lb)    History of present illness:   83 y.o. female with a past medical history significant for dementia, CVA, hypothyroidism, PCM, NIDDM, HTN, CKD III, and hx of BrCa who presents with cough.  Pt diagnosed with PE  Hospital Course:   Principal Problem:   PE (pulmonary thromboembolism) ([place]) - Echocardiogram reviewed and reporting normal EF, with pulmonic valve transvalvular velocity within normal range. - transitioned to NOAC. Consulted care management for assistance. Discussed with daughter. - Pt tolerated xarelto. D/c'd with xarelto starter pack.  Active Problems:   Essential hypertension   CKD (chronic kidney disease), stage III - stable    Hypokalemia - resolved after replacement.    Decubitus ulcer of left heel, unstageable ([place])   Dementia without behavioral disturbance - at baseline. Advanced     Anemia in chronic renal disease   Diabetes mellitus with renal  manifestations, controlled ([place]) - most likely diet controlled    Hypothyroidism due to acquired atrophy of thyroid - continue synthroid    Moderate protein-calorie malnutrition ([place]) - continue nutritional supplementation at facility   Procedures:  None  Consultations:  None  Discharge Exam: Vitals:   06/28/16 1941 06/29/16 0512  BP: (!) 156/55 (!) 143/65  Pulse: 80 97  Resp: 18 18  Temp: 98.6 F (37 C) 98.8 F (37.1 C)    General: Pt in nad, alert and awake Cardiovascular: rrr, no rubs Respiratory: no increased wob, equal chest rise, [place] in place.  Discharge Instructions   Discharge Instructions    Call MD for:  difficulty breathing, headache or visual disturbances    Complete by:  As directed    Call MD for:  severe uncontrolled pain    Complete by:  As directed    Call MD for:  temperature >100.4    Complete by:  As directed    Diet - low sodium heart healthy    Complete by:  As directed    Discharge instructions    Complete by:  As directed    Please have patient follow up with a primary care physician while at facility. Her amlodipine and lisinopril were held on d/c consider continuing if warranted.   Increase activity slowly    Complete by:  As directed      Current Discharge Medication List    START taking these medications   Details  Rivaroxaban 15 & 20 MG TBPK Take as directed on package: Start with one 59m tablet by mouth twice a day with food. On Day 22, switch to one 234mtablet once a  day with food. Qty: 51 each, Refills: 0      CONTINUE these medications which have NOT CHANGED   Details  Amino Acids-Protein Hydrolys (FEEDING SUPPLEMENT, PRO-STAT SUGAR FREE 64,) LIQD Take 30 mLs by mouth 3 (three) times daily with meals.    Ascorbic Acid (VITAMIN C) 1000 MG tablet Take 1,000 mg by mouth daily.    Cholecalciferol (VITAMIN D) 2000 UNITS tablet Take 2,000 Units by mouth daily.    clopidogrel (PLAVIX) 75 MG tablet Take 1 tablet (75 mg  total) by mouth daily.    donepezil (ARICEPT) 5 MG tablet Take 5 mg by mouth at bedtime.     ferrous sulfate 325 (65 FE) MG tablet Take 325 mg by mouth 2 (two) times daily with a meal.    HYDROcodone-acetaminophen (NORCO) 10-325 MG tablet Take 1 tablet by mouth 2 (two) times daily. Qty: 60 tablet, Refills: 0    ipratropium-albuterol (DUONEB) 0.5-2.5 (3) MG/3ML SOLN Take 3 mLs by nebulization every 6 (six) hours as needed (wheezing).    levothyroxine (SYNTHROID, LEVOTHROID) 137 MCG tablet Take 137 mcg by mouth daily before breakfast.    LORazepam (ATIVAN) 0.5 MG tablet Take 0.5 mg by mouth daily.     magnesium hydroxide (MILK OF MAGNESIA) 400 MG/5ML suspension Take 45 mLs by mouth daily as needed for mild constipation.    metoprolol tartrate (LOPRESSOR) 25 MG tablet Take 12.5 mg by mouth 2 (two) times daily.    Multiple Vitamins-Minerals (ELDERTONIC PO) Give 15 cc by mouth two times daily for weight loss    Nutritional Supplements (NUTRITIONAL SUPPLEMENT PO) Take 120 mLs by mouth 4 (four) times daily. Puree texture, regular consistency    PARoxetine (PAXIL) 40 MG tablet Take 40 mg by mouth daily.    rOPINIRole (REQUIP) 3 MG tablet Take 3 mg by mouth at bedtime.     saccharomyces boulardii (FLORASTOR) 250 MG capsule Take 250 mg by mouth 2 (two) times daily.    UNABLE TO FIND House Supplement - Magic Cup every day with lunch and dinner    vitamin B-12 1000 MCG tablet Take 1 tablet (1,000 mcg total) by mouth daily.      STOP taking these medications     amLODipine (NORVASC) 10 MG tablet      lisinopril (PRINIVIL,ZESTRIL) 40 MG tablet        Allergies  Allergen Reactions  . Aspirin Hives  . Penicillins Hives    Has patient had a PCN reaction causing immediate rash, facial/tongue/throat swelling, SOB or lightheadedness with hypotension: Has patient had a PCN reaction causing severe rash involving mucus membranes or skin necrosis:  Has patient had a PCN reaction that required  hospitalization Has patient had a PCN reaction occurring within the last 10 years:  If all of the above answers are "NO", then may proceed with Cephalosporin use.      The results of significant diagnostics from this hospitalization (including imaging, microbiology, ancillary and laboratory) are listed below for reference.    Significant Diagnostic Studies: Dg Chest 2 View  Result Date: 06/25/2016 CLINICAL DATA:  Cough. Pt reports to the ED for eval of continued pulmonary congestion. Pt seemed confused, unable to answer hx questions. She did say she's had a productive cough, but was unsure what color her phlegm was. Hx of DM, HTN, thyroid disease. EXAM: CHEST  2 VIEW COMPARISON:  02/26/2015 chest x-ray and chest CT FINDINGS: The heart is mildly enlarged. There is new left upper Lower atelectasis. The right lung is  clear. There is a small left pleural effusion. Surgical clips overlie the left lateral chest wall. IMPRESSION: Left upper lobar atelectasis. Recommend CT of the chest with contrast to exclude obstructing central pulmonary mass. Electronically Signed   By: Nolon Nations M.D.   On: 06/25/2016 20:27   Ct Chest W Contrast  Result Date: 06/25/2016 CLINICAL DATA:  The patient has a cough. CT recommended to exclude a central obstructing mass on the left based on chest x-ray. EXAM: CT CHEST WITH CONTRAST TECHNIQUE: Multidetector CT imaging of the chest was performed during intravenous contrast administration. CONTRAST:  75 mL of Isovue 370 COMPARISON:  Chest x-ray from earlier today and CT scan February 26, 2015 FINDINGS: Cardiovascular: The heart is unchanged. Coronary artery calcifications are noted. The thoracic aorta is non aneurysmal. No dissection. The study was not tailored to evaluate for pulmonary emboli. No emboli are seen in the main pulmonary arteries. A linear filling defect is seen in the periphery of a left lower lobe pulmonary artery as seen on axial image 66 and coronal image 68.  No emboli seen on the right. The volume loss on the left results in mild shift of the mediastinum to the left. Mediastinum/Nodes: The esophagus is normal. No adenopathy identified. Moderate left pleural effusion is seen. No pericardial effusion or right pleural effusion. Lungs/Pleura: The trachea and mainstem bronchi are normal. Opacification of right lower lobe airways. There is significant atelectasis in the left upper lobe. No underlying cause is identified. No centrally obstructing masses noted. There is more mild atelectasis in the left base. There is a moderate left-sided pleural effusion. Dependent atelectasis seen on the right. No pneumothorax. No suspicious pulmonary nodule or mass is noted. Upper Abdomen: Renal cysts are identified. Increased enhancement in the hepatic dome as seen on coronal image 89, probably a hemangioma or perfusional variant. Another probable hemangioma is seen in the hepatic dome on image 100 as well. No other acute abnormalities in the upper abdomen. Musculoskeletal: No chest wall abnormality. No acute or significant osseous findings. IMPRESSION: 1. There is a linear filling defect in the periphery of a left lower lobe pulmonary artery. This is consistent with an age indeterminate pulmonary embolus. While indeterminate, this may not be acute. No other emboli identified. Recommend clinical correlation. 2. Significant atelectasis in the left upper lobe resulting in volume loss. No underlying cause identified. No central obstructing mass is noted. The underlying lung is not well assessed. Recommend short-term follow-up to ensure resolution. 3. Opacification of right lower lobe bronchi may represent mucus or aspirated material. No infiltrate. 4. Moderate left-sided pleural effusion. 5. No other acute abnormalities. These results will be called to the ordering clinician or representative by the Radiologist Assistant, and communication documented in the PACS or zVision Dashboard.  Electronically Signed   By: Dorise Bullion III M.D   On: 06/25/2016 22:00    Microbiology: Recent Results (from the past 240 hour(s))  Culture, blood (routine x 2)     Status: None (Preliminary result)   Collection Time: 06/25/16  5:04 AM  Result Value Ref Range Status   Specimen Description BLOOD LEFT HAND  Final   Special Requests IN PEDIATRIC BOTTLE Blood Culture adequate volume  Final   Culture NO GROWTH 2 DAYS  Final   Report Status PENDING  Incomplete  Culture, blood (routine x 2)     Status: None (Preliminary result)   Collection Time: 06/26/16  2:30 AM  Result Value Ref Range Status   Specimen Description BLOOD  RIGHT HAND  Final   Special Requests IN PEDIATRIC BOTTLE Blood Culture adequate volume  Final   Culture NO GROWTH 2 DAYS  Final   Report Status PENDING  Incomplete  MRSA PCR Screening     Status: None   Collection Time: 06/26/16  4:37 AM  Result Value Ref Range Status   MRSA by PCR NEGATIVE NEGATIVE Final    Comment:        The GeneXpert MRSA Assay (FDA approved for NASAL specimens only), is one component of a comprehensive MRSA colonization surveillance program. It is not intended to diagnose MRSA infection nor to guide or monitor treatment for MRSA infections.      Labs: Basic Metabolic Panel:  Recent Labs Lab 06/23/16 06/25/16 2000 06/25/16 2349 06/26/16 0105 06/28/16 0356  NA 141 144  --  138 141  K 3.6 2.9*  --  3.3* 3.6  CL  --  107  --  103 106  CO2  --  28  --  26 28  GLUCOSE  --  155*  --  116* 87  BUN 20 21*  --  20 8  CREATININE 1.1 1.40*  --  1.36* 1.08*  CALCIUM  --  8.2*  --  8.0* 8.6*  MG  --   --  1.6* 1.6*  --    Liver Function Tests:  Recent Labs Lab 06/23/16  AST 18  ALT 8  ALKPHOS 76   No results for input(s): LIPASE, AMYLASE in the last 168 hours. No results for input(s): AMMONIA in the last 168 hours. CBC:  Recent Labs Lab 06/23/16 06/25/16 2000 06/26/16 0105 06/27/16 0331 06/28/16 0356 06/29/16 0227  WBC  5.0 6.7 7.3 8.7 8.0 8.1  NEUTROABS 3 4.6  --   --   --   --   HGB 10.4* 9.7* 10.1* 9.8* 10.0* 9.6*  HCT 33* 31.5* 32.3* 31.1* 32.0* 30.3*  MCV  --  75.9* 75.6* 75.9* 76.0* 75.0*  PLT 182 303 285 344 279 274   Cardiac Enzymes: No results for input(s): CKTOTAL, CKMB, CKMBINDEX, TROPONINI in the last 168 hours. BNP: BNP (last 3 results) No results for input(s): BNP in the last 8760 hours.  ProBNP (last 3 results) No results for input(s): PROBNP in the last 8760 hours.  CBG: No results for input(s): GLUCAP in the last 168 hours.     Signed:  Velvet Bathe MD.  [facility] 06/29/2016, 12:57 PM

## 2016-06-29 NOTE — Progress Notes (Signed)
Pt transported back to [facility]. Report called. No questions.

## 2016-06-29 NOTE — Consult Note (Signed)
           [facility] CM Primary Care Navigator  06/29/2016  [name] 07-31-1930 [id number]    Went see patient today at the bedside to identify possible discharge needs but she is presently being prepared by EMS for transport back to skilled nursing facility.  Patient is an 83 year old with history of dementia who presented with pulmonary thromboembolism (PE). She is followed up by Gildardo Cranker, DO with [facility].  Staffreports that she is going back to [facility] facility where she resides prior to this hospitalization.    For questions, please contact:  Dannielle Huh, BSN, RN- [facility] Primary Care Navigator  Telephone: [phone] [place]

## 2016-06-30 ENCOUNTER — Encounter: Payer: Self-pay | Admitting: Internal Medicine

## 2016-06-30 ENCOUNTER — Non-Acute Institutional Stay (SKILLED_NURSING_FACILITY): Payer: Medicare Other | Admitting: Internal Medicine

## 2016-06-30 DIAGNOSIS — J90 Pleural effusion, not elsewhere classified: Secondary | ICD-10-CM | POA: Diagnosis not present

## 2016-06-30 DIAGNOSIS — E1122 Type 2 diabetes mellitus with diabetic chronic kidney disease: Secondary | ICD-10-CM

## 2016-06-30 DIAGNOSIS — D631 Anemia in chronic kidney disease: Secondary | ICD-10-CM | POA: Diagnosis not present

## 2016-06-30 DIAGNOSIS — I2699 Other pulmonary embolism without acute cor pulmonale: Secondary | ICD-10-CM | POA: Diagnosis not present

## 2016-06-30 DIAGNOSIS — I69354 Hemiplegia and hemiparesis following cerebral infarction affecting left non-dominant side: Secondary | ICD-10-CM | POA: Diagnosis not present

## 2016-06-30 DIAGNOSIS — I10 Essential (primary) hypertension: Secondary | ICD-10-CM

## 2016-06-30 DIAGNOSIS — E034 Atrophy of thyroid (acquired): Secondary | ICD-10-CM

## 2016-06-30 DIAGNOSIS — L89620 Pressure ulcer of left heel, unstageable: Secondary | ICD-10-CM | POA: Diagnosis not present

## 2016-06-30 DIAGNOSIS — Z853 Personal history of malignant neoplasm of breast: Secondary | ICD-10-CM | POA: Diagnosis not present

## 2016-06-30 DIAGNOSIS — L89623 Pressure ulcer of left heel, stage 3: Secondary | ICD-10-CM | POA: Diagnosis not present

## 2016-06-30 DIAGNOSIS — N183 Chronic kidney disease, stage 3 unspecified: Secondary | ICD-10-CM

## 2016-06-30 DIAGNOSIS — F0150 Vascular dementia without behavioral disturbance: Secondary | ICD-10-CM

## 2016-06-30 NOTE — Progress Notes (Signed)
Patient ID: Emerlyn Mehlhoff, female   DOB: [birth year], 83 y.o.   MRN: [id number]    HISTORY AND PHYSICAL   DATE: 06/30/2016  Location:    [place] Room Number: 112 B Place of Service: SNF (31)   Extended Emergency Contact Information Primary Emergency Contact: Benton,Eleanor Address: [address], [place] Phone: [phone] Relation: Daughter Secondary Emergency Contact: Claudette Head [place] Phone: [phone] Mobile Phone: [phone] Relation: Sister  Advanced Directive information Does Patient Have a Medical Advance Directive?: Yes, Type of Advance Directive: Healthcare Power of [place], Does patient want to make changes to medical advance directive?: No - Patient declined  Chief Complaint  Patient presents with  . Readmit To SNF    Readmit    HPI:  83 yo female long term resident seen today as a readmission into SNF following hospital stay for PE, hypokalemia, HTN, anemia of chronic disease, DM, hypothyroidism, moderate protein calorie malnutrition, left heel ulcer, CKD. She presented to the ED with cough. CXR revealed LUL atelectasis and she was sent for CT chest. CT revealed LLL pulmonary artery linear filling defect c/w age indeterminate PE; moderate left pleural effusion; RLL opacification. She was started on xeralto. 2D echo showed nml EF and no right heart strain. Hgb 9.6; K 2.9-->3.6; Plts 274K; Mg 1.6; Cr 1.4-->1.08 at d/c. She presents to SNF to continue long term care.  Today she has no c/o. Her cough is better. No nursing issues. No f/c. No bleeding. She is a poor historian due to dementia. Hx obtained from chart.   hx CVA - she has left hemiparesis. Stable on plavix 75 mg daily;  vicodin 10/325 mg twice daily for pain managemen  Hypothyroidism - stable on synthroid 137 mcg daily. T4 free level 1.19; TSH 7.45  Hx Anemia of chronic disease - stable. Hgb 9.6 on iron  twice daily; Vitamin b12 1000 mcg daily.  iron level is 47 (prev 13)  DM - diet controlled. A1c 5.3. CBG 97 today. Off ACEI due to recent AKI; LDL 54.   Hypertension - uncontrolled on lopressor 12.5 mg twice daily. Off lisinopril and amlodipine  Restless leg syndrome - stable on requip  3 mg nightly   Depression with anxiety - mood stable on paxil 40 mg daily   CKD - stage 3. Cr 1.08  Hypokalemia - K 3.6 off routine supplement  Hx Left breast cancer - T1N1 ER/PR +, HER 2 negative, 2 sentinel nodes involved; s/p left mastectomy with 3 node axillary evaluation and 5 years of Femara thru 06-2013  Dysphagia - no signs of aspiration present; she is on nectar thick liquids.   Dementia - stable on aricept 5 mg nightly.  Weight is 141 lbs.   Protein calorie malnutrition - wt is up 7 lbs since last month. albumin is 2.9. She gets supplement per facility protocol and eldertonic 15 cc prior to lunch and supper.    Left  heel ulceration stage III - followed by wound care physician.    Past Medical History:  Diagnosis Date  . Anemia   . Arthritis   . Chronic kidney disease   . Diabetes mellitus without complication ([place])   . Hemorrhoid   . Hypertension   . Stroke ([place])   . Thyroid disease     Past Surgical History:  Procedure Laterality Date  . ABDOMINAL HYSTERECTOMY     PARTIAL  .  BREAST LUMPECTOMY     LEFT  . MASTECTOMY  05/2008   Left    Patient Care Team: Gildardo Cranker, DO as PCP - General (Internal Medicine) [place]ith [place], LCSW as Social Worker Nyoka Cowden, Phylis Bougie, NP as Nurse Practitioner ([place]) [place], [place] ([place])  Social History   Social History  . Marital status: Single    Spouse name: N/A  . Number of children: N/A  . Years of education: N/A   Occupational History  . Not on file.   Social History Main Topics  . Smoking status: Never Smoker  . Smokeless tobacco: Never Used  . Alcohol use No  . Drug use:  No  . Sexual activity: Not on file   Other Topics Concern  . Not on file   Social History Narrative  . No narrative on file     reports that she has never smoked. She has never used smokeless tobacco. She reports that she does not drink alcohol or use drugs.  History reviewed. No pertinent family history. Family Status  Relation Status  . Mother Deceased  . Father Deceased  . Sister Alive  . Brother Deceased  . Sister Deceased    Immunization History  Administered Date(s) Administered  . Influenza,inj,Quad PF,36+ Mos 10/07/2012, 10/13/2013  . Influenza-Unspecified 10/12/2014, 11/21/2015  . Pneumococcal Conjugate-13 10/14/2010    Allergies  Allergen Reactions  . Aspirin Hives  . Penicillins Hives        Medications: Patient's Medications  New Prescriptions   No medications on file  Previous Medications   AMINO ACIDS-PROTEIN HYDROLYS (FEEDING SUPPLEMENT, PRO-STAT SUGAR FREE 64,) LIQD    Take 30 mLs by mouth 3 (three) times daily with meals.   ASCORBIC ACID (VITAMIN C) 1000 MG TABLET    Take 1,000 mg by mouth daily.   CHOLECALCIFEROL (VITAMIN D) 2000 UNITS TABLET    Take 2,000 Units by mouth daily.   CLOPIDOGREL (PLAVIX) 75 MG TABLET    Take 1 tablet (75 mg total) by mouth daily.   DONEPEZIL (ARICEPT) 5 MG TABLET    Take 5 mg by mouth at bedtime.    FERROUS SULFATE 325 (65 FE) MG TABLET    Take 325 mg by mouth 2 (two) times daily with a meal.   HYDROCODONE-ACETAMINOPHEN (NORCO) 10-325 MG TABLET    Take 1 tablet by mouth 2 (two) times daily.   IPRATROPIUM-ALBUTEROL (DUONEB) 0.5-2.5 (3) MG/3ML SOLN    Take 3 mLs by nebulization every 6 (six) hours as needed (wheezing).   LEVOTHYROXINE (SYNTHROID, LEVOTHROID) 137 MCG TABLET    Take 137 mcg by mouth daily before breakfast.   LORAZEPAM (ATIVAN) 0.5 MG TABLET    Take 0.5 mg by mouth daily.    MAGNESIUM HYDROXIDE (MILK OF MAGNESIA) 400 MG/5ML SUSPENSION    Take 45 mLs by mouth daily as needed for mild constipation.    METOPROLOL TARTRATE (LOPRESSOR) 25 MG TABLET    Take 12.5 mg by mouth 2 (two) times daily.   MULTIPLE VITAMINS-MINERALS (ELDERTONIC PO)    Give 15 cc by mouth two times daily for weight loss   NUTRITIONAL SUPPLEMENTS (NUTRITIONAL SUPPLEMENT PO)    Take 120 mLs by mouth 4 (four) times daily. Puree texture, regular consistency   PAROXETINE (PAXIL) 40 MG TABLET    Take 40 mg by mouth daily.   RIVAROXABAN 15 & 20 MG TBPK    Take as directed on package: Start with one 21m tablet by mouth twice a  day with food. On Day 22, switch to one 39m tablet once a day with food.   ROPINIROLE (REQUIP) 3 MG TABLET    Take 3 mg by mouth at bedtime.    SACCHAROMYCES BOULARDII (FLORASTOR) 250 MG CAPSULE    Take 250 mg by mouth 2 (two) times daily.   UNABLE TO FIND    House Supplement - Magic Cup every day with lunch and dinner   VITAMIN B-12 1000 MCG TABLET    Take 1 tablet (1,000 mcg total) by mouth daily.  Modified Medications   No medications on file  Discontinued Medications   No medications on file    Review of Systems  Unable to perform ROS: Dementia    Vitals:   06/30/16 1013  BP: (!) 156/82  Pulse: 70  Resp: 20  Temp: 97.7 F (36.5 C)  TempSrc: Oral  SpO2: 98%  Weight: 141 lb 12.8 oz (64.3 kg)   Body mass index is 22.21 kg/m.  Physical Exam  Constitutional: She appears well-developed.  Lying in bed in NAD, frail appearing  HENT:  Mouth/Throat: Oropharynx is clear and moist. No oropharyngeal exudate.  MMM. No tongue lesions  Eyes: Pupils are equal, round, and reactive to light. No scleral icterus.  Neck: Neck supple. Carotid bruit is not present. No tracheal deviation present. No thyromegaly present.  Cardiovascular: Normal rate, regular rhythm and intact distal pulses.  Exam reveals no gallop and no friction rub.   Murmur (1/6 SEM) heard. No LE edema b/l. no calf TTP.   Pulmonary/Chest: Effort normal. No stridor. No respiratory distress. She has no wheezes. She has rales (left base).    Abdominal: Soft. Bowel sounds are normal. She exhibits no distension and no mass. There is no hepatomegaly. There is no tenderness. There is no rebound and no guarding.  Musculoskeletal: She exhibits edema.  (+) contractures  Lymphadenopathy:    She has no cervical adenopathy.  Neurological: She is alert. She displays tremor (resting).  Skin: Skin is warm and dry. No rash noted.  Left heel ulcer dry with no d/c. Followed by facility wound care. Right foot unna boot intact  Psychiatric: She has a normal mood and affect. Her behavior is normal.     Labs reviewed: Admission on 06/25/2016, Discharged on 06/29/2016  Component Date Value Ref Range Status  . WBC 06/25/2016 6.7  4.0 - 10.5 K/uL Final  . RBC 06/25/2016 4.15  3.87 - 5.11 MIL/uL Final  . Hemoglobin 06/25/2016 9.7* 12.0 - 15.0 g/dL Final  . HCT 06/25/2016 31.5* 36.0 - 46.0 % Final  . MCV 06/25/2016 75.9* 78.0 - 100.0 fL Final  . MCH 06/25/2016 23.4* 26.0 - 34.0 pg Final  . MCHC 06/25/2016 30.8  30.0 - 36.0 g/dL Final  . RDW 06/25/2016 14.6  11.5 - 15.5 % Final  . Platelets 06/25/2016 303  150 - 400 K/uL Final  . Neutrophils Relative % 06/25/2016 69  % Final  . Neutro Abs 06/25/2016 4.6  1.7 - 7.7 K/uL Final  . Lymphocytes Relative 06/25/2016 24  % Final  . Lymphs Abs 06/25/2016 1.6  0.7 - 4.0 K/uL Final  . Monocytes Relative 06/25/2016 5  % Final  . Monocytes Absolute 06/25/2016 0.4  0.1 - 1.0 K/uL Final  . Eosinophils Relative 06/25/2016 2  % Final  . Eosinophils Absolute 06/25/2016 0.1  0.0 - 0.7 K/uL Final  . Basophils Relative 06/25/2016 0  % Final  . Basophils Absolute 06/25/2016 0.0  0.0 - 0.1 K/uL Final  .  Sodium 06/25/2016 144  135 - 145 mmol/L Final  . Potassium 06/25/2016 2.9* 3.5 - 5.1 mmol/L Final  . Chloride 06/25/2016 107  101 - 111 mmol/L Final  . CO2 06/25/2016 28  22 - 32 mmol/L Final  . Glucose, Bld 06/25/2016 155* 65 - 99 mg/dL Final  . BUN 06/25/2016 21* 6 - 20 mg/dL Final  . Creatinine, Ser  06/25/2016 1.40* 0.44 - 1.00 mg/dL Final  . Calcium 06/25/2016 8.2* 8.9 - 10.3 mg/dL Final  . GFR calc non Af Amer 06/25/2016 34* >60 mL/min Final  . GFR calc Af Amer 06/25/2016 39* >60 mL/min Final   Comment: (NOTE) The eGFR has been calculated using the CKD EPI equation. This calculation has not been validated in all clinical situations. eGFR's persistently <60 mL/min signify possible Chronic Kidney Disease.   . Anion gap 06/25/2016 9  5 - 15 Final  . Specimen Description 06/25/2016 BLOOD LEFT HAND   Final  . Special Requests 06/25/2016 IN PEDIATRIC BOTTLE Blood Culture adequate volume   Final  . Culture 06/25/2016 NO GROWTH 3 DAYS   Final  . Report Status 06/25/2016 PENDING   Incomplete  . Specimen Description 06/26/2016 BLOOD RIGHT HAND   Final  . Special Requests 06/26/2016 IN PEDIATRIC BOTTLE Blood Culture adequate volume   Final  . Culture 06/26/2016 NO GROWTH 3 DAYS   Final  . Report Status 06/26/2016 PENDING   Incomplete  . Magnesium 06/25/2016 1.6* 1.7 - 2.4 mg/dL Final  . Heparin Unfractionated 06/26/2016 0.20* 0.30 - 0.70 IU/mL Final   Comment:        IF HEPARIN RESULTS ARE BELOW EXPECTED VALUES, AND PATIENT DOSAGE HAS BEEN CONFIRMED, SUGGEST FOLLOW UP TESTING OF ANTITHROMBIN III LEVELS.   . WBC 06/26/2016 7.3  4.0 - 10.5 K/uL Final  . RBC 06/26/2016 4.27  3.87 - 5.11 MIL/uL Final  . Hemoglobin 06/26/2016 10.1* 12.0 - 15.0 g/dL Final  . HCT 06/26/2016 32.3* 36.0 - 46.0 % Final  . MCV 06/26/2016 75.6* 78.0 - 100.0 fL Final  . MCH 06/26/2016 23.7* 26.0 - 34.0 pg Final  . MCHC 06/26/2016 31.3  30.0 - 36.0 g/dL Final  . RDW 06/26/2016 14.7  11.5 - 15.5 % Final  . Platelets 06/26/2016 285  150 - 400 K/uL Final  . Sodium 06/26/2016 138  135 - 145 mmol/L Final  . Potassium 06/26/2016 3.3* 3.5 - 5.1 mmol/L Final  . Chloride 06/26/2016 103  101 - 111 mmol/L Final  . CO2 06/26/2016 26  22 - 32 mmol/L Final  . Glucose, Bld 06/26/2016 116* 65 - 99 mg/dL Final  . BUN  06/26/2016 20  6 - 20 mg/dL Final  . Creatinine, Ser 06/26/2016 1.36* 0.44 - 1.00 mg/dL Final  . Calcium 06/26/2016 8.0* 8.9 - 10.3 mg/dL Final  . GFR calc non Af Amer 06/26/2016 35* >60 mL/min Final  . GFR calc Af Amer 06/26/2016 40* >60 mL/min Final   Comment: (NOTE) The eGFR has been calculated using the CKD EPI equation. This calculation has not been validated in all clinical situations. eGFR's persistently <60 mL/min signify possible Chronic Kidney Disease.   . Anion gap 06/26/2016 9  5 - 15 Final  . Weight 06/26/2016 2208  oz Final  . Height 06/26/2016 67  in Final  . BP 06/26/2016 142/69  mmHg Final  . Magnesium 06/26/2016 1.6* 1.7 - 2.4 mg/dL Final  . MRSA by PCR 06/26/2016 NEGATIVE  NEGATIVE Final   Comment:  The GeneXpert MRSA Assay (FDA approved for NASAL specimens only), is one component of a comprehensive MRSA colonization surveillance program. It is not intended to diagnose MRSA infection nor to guide or monitor treatment for MRSA infections.   . Heparin Unfractionated 06/26/2016 0.56  0.30 - 0.70 IU/mL Final   Comment:        IF HEPARIN RESULTS ARE BELOW EXPECTED VALUES, AND PATIENT DOSAGE HAS BEEN CONFIRMED, SUGGEST FOLLOW UP TESTING OF ANTITHROMBIN III LEVELS.   Marland Kitchen Heparin Unfractionated 06/27/2016 0.82* 0.30 - 0.70 IU/mL Final   Comment:        IF HEPARIN RESULTS ARE BELOW EXPECTED VALUES, AND PATIENT DOSAGE HAS BEEN CONFIRMED, SUGGEST FOLLOW UP TESTING OF ANTITHROMBIN III LEVELS.   . WBC 06/27/2016 8.7  4.0 - 10.5 K/uL Final  . RBC 06/27/2016 4.10  3.87 - 5.11 MIL/uL Final  . Hemoglobin 06/27/2016 9.8* 12.0 - 15.0 g/dL Final  . HCT 06/27/2016 31.1* 36.0 - 46.0 % Final  . MCV 06/27/2016 75.9* 78.0 - 100.0 fL Final  . MCH 06/27/2016 23.9* 26.0 - 34.0 pg Final  . MCHC 06/27/2016 31.5  30.0 - 36.0 g/dL Final  . RDW 06/27/2016 15.1  11.5 - 15.5 % Final  . Platelets 06/27/2016 344  150 - 400 K/uL Final  . Heparin Unfractionated 06/27/2016 0.30   0.30 - 0.70 IU/mL Final   Comment:        IF HEPARIN RESULTS ARE BELOW EXPECTED VALUES, AND PATIENT DOSAGE HAS BEEN CONFIRMED, SUGGEST FOLLOW UP TESTING OF ANTITHROMBIN III LEVELS.   Marland Kitchen Heparin Unfractionated 06/27/2016 0.38  0.30 - 0.70 IU/mL Final   Comment:        IF HEPARIN RESULTS ARE BELOW EXPECTED VALUES, AND PATIENT DOSAGE HAS BEEN CONFIRMED, SUGGEST FOLLOW UP TESTING OF ANTITHROMBIN III LEVELS.   Marland Kitchen Sodium 06/28/2016 141  135 - 145 mmol/L Final  . Potassium 06/28/2016 3.6  3.5 - 5.1 mmol/L Final  . Chloride 06/28/2016 106  101 - 111 mmol/L Final  . CO2 06/28/2016 28  22 - 32 mmol/L Final  . Glucose, Bld 06/28/2016 87  65 - 99 mg/dL Final  . BUN 06/28/2016 8  6 - 20 mg/dL Final  . Creatinine, Ser 06/28/2016 1.08* 0.44 - 1.00 mg/dL Final  . Calcium 06/28/2016 8.6* 8.9 - 10.3 mg/dL Final  . GFR calc non Af Amer 06/28/2016 46* >60 mL/min Final  . GFR calc Af Amer 06/28/2016 53* >60 mL/min Final   Comment: (NOTE) The eGFR has been calculated using the CKD EPI equation. This calculation has not been validated in all clinical situations. eGFR's persistently <60 mL/min signify possible Chronic Kidney Disease.   . Anion gap 06/28/2016 7  5 - 15 Final  . Heparin Unfractionated 06/28/2016 0.34  0.30 - 0.70 IU/mL Final   Comment:        IF HEPARIN RESULTS ARE BELOW EXPECTED VALUES, AND PATIENT DOSAGE HAS BEEN CONFIRMED, SUGGEST FOLLOW UP TESTING OF ANTITHROMBIN III LEVELS.   . WBC 06/28/2016 8.0  4.0 - 10.5 K/uL Final  . RBC 06/28/2016 4.21  3.87 - 5.11 MIL/uL Final  . Hemoglobin 06/28/2016 10.0* 12.0 - 15.0 g/dL Final  . HCT 06/28/2016 32.0* 36.0 - 46.0 % Final  . MCV 06/28/2016 76.0* 78.0 - 100.0 fL Final  . MCH 06/28/2016 23.8* 26.0 - 34.0 pg Final  . MCHC 06/28/2016 31.3  30.0 - 36.0 g/dL Final  . RDW 06/28/2016 15.5  11.5 - 15.5 % Final  . Platelets 06/28/2016 279  150 -  400 K/uL Final  . Heparin Unfractionated 06/29/2016 1.98* 0.30 - 0.70 IU/mL Final   Comment:  RESULTS CONFIRMED BY MANUAL DILUTION        IF HEPARIN RESULTS ARE BELOW EXPECTED VALUES, AND PATIENT DOSAGE HAS BEEN CONFIRMED, SUGGEST FOLLOW UP TESTING OF ANTITHROMBIN III LEVELS.   . WBC 06/29/2016 8.1  4.0 - 10.5 K/uL Final  . RBC 06/29/2016 4.04  3.87 - 5.11 MIL/uL Final  . Hemoglobin 06/29/2016 9.6* 12.0 - 15.0 g/dL Final  . HCT 06/29/2016 30.3* 36.0 - 46.0 % Final  . MCV 06/29/2016 75.0* 78.0 - 100.0 fL Final  . MCH 06/29/2016 23.8* 26.0 - 34.0 pg Final  . MCHC 06/29/2016 31.7  30.0 - 36.0 g/dL Final  . RDW 06/29/2016 15.0  11.5 - 15.5 % Final  . Platelets 06/29/2016 274  150 - 400 K/uL Final  Abstract on 06/25/2016  Component Date Value Ref Range Status  . Microalb, Ur 06/23/2016 >60.1   Final  . Hemoglobin 06/23/2016 10.4* 12.0 - 16.0 Final  . HCT 06/23/2016 33* 36 - 46 Final  . Neutrophils Absolute 06/23/2016 3   Final  . Platelets 06/23/2016 182  150 - 399 Final  . WBC 06/23/2016 5.0   Final  . Glucose 06/23/2016 70   Final  . BUN 06/23/2016 20  4 - 21 Final  . Creatinine 06/23/2016 1.1  0.5 - 1.1 Final  . Potassium 06/23/2016 3.6  3.4 - 5.3 Final  . Sodium 06/23/2016 141  137 - 147 Final  . Alkaline Phosphatase 06/23/2016 76  25 - 125 Final  . ALT 06/23/2016 8  7 - 35 Final  . AST 06/23/2016 18  13 - 35 Final  . Bilirubin, Total 06/23/2016 0.4   Final  Nursing Home on 04/20/2016  Component Date Value Ref Range Status  . Hemoglobin 02/24/2016 9.9* 12.0 - 16.0 g/dL Final  . HCT 02/24/2016 33* 36 - 46 % Final  . Neutrophils Absolute 02/24/2016 4  /L Final  . Platelets 02/24/2016 196  150 - 399 K/L Final  . WBC 02/24/2016 6.3  10^3/mL Final  . Glucose 02/24/2016 82  mg/dL Final  . BUN 02/24/2016 18  4 - 21 mg/dL Final  . Creatinine 02/24/2016 1.0  0.5 - 1.1 mg/dL Final  . Potassium 02/24/2016 4.7  3.4 - 5.3 mmol/L Final  . Sodium 02/24/2016 142  137 - 147 mmol/L Final  . Triglycerides 02/24/2016 141  40 - 160 mg/dL Final  . Cholesterol 02/24/2016 118  0 - 200  mg/dL Final  . HDL 02/24/2016 36  35 - 70 mg/dL Final  . LDL Cholesterol 02/24/2016 54  mg/dL Final  . Alkaline Phosphatase 02/24/2016 88  25 - 125 U/L Final  . ALT 02/24/2016 10  7 - 35 U/L Final  . AST 02/24/2016 13  13 - 35 U/L Final  . Bilirubin, Total 02/24/2016 0.3  mg/dL Final  . Hemoglobin A1C 02/24/2016 5.3   Final    Dg Chest 2 View  Result Date: 06/25/2016 CLINICAL DATA:  Cough. Pt reports to the ED for eval of continued pulmonary congestion. Pt seemed confused, unable to answer hx questions. She did say she's had a productive cough, but was unsure what color her phlegm was. Hx of DM, HTN, thyroid disease. EXAM: CHEST  2 VIEW COMPARISON:  02/26/2015 chest x-ray and chest CT FINDINGS: The heart is mildly enlarged. There is new left upper Lower atelectasis. The right lung is clear. There is a small left pleural  effusion. Surgical clips overlie the left lateral chest wall. IMPRESSION: Left upper lobar atelectasis. Recommend CT of the chest with contrast to exclude obstructing central pulmonary mass. Electronically Signed   By: Nolon Nations M.D.   On: 06/25/2016 20:27   Ct Chest W Contrast  Result Date: 06/25/2016 CLINICAL DATA:  The patient has a cough. CT recommended to exclude a central obstructing mass on the left based on chest x-ray. EXAM: CT CHEST WITH CONTRAST TECHNIQUE: Multidetector CT imaging of the chest was performed during intravenous contrast administration. CONTRAST:  75 mL of Isovue 370 COMPARISON:  Chest x-ray from earlier today and CT scan February 26, 2015 FINDINGS: Cardiovascular: The heart is unchanged. Coronary artery calcifications are noted. The thoracic aorta is non aneurysmal. No dissection. The study was not tailored to evaluate for pulmonary emboli. No emboli are seen in the main pulmonary arteries. A linear filling defect is seen in the periphery of a left lower lobe pulmonary artery as seen on axial image 66 and coronal image 68. No emboli seen on the right. The  volume loss on the left results in mild shift of the mediastinum to the left. Mediastinum/Nodes: The esophagus is normal. No adenopathy identified. Moderate left pleural effusion is seen. No pericardial effusion or right pleural effusion. Lungs/Pleura: The trachea and mainstem bronchi are normal. Opacification of right lower lobe airways. There is significant atelectasis in the left upper lobe. No underlying cause is identified. No centrally obstructing masses noted. There is more mild atelectasis in the left base. There is a moderate left-sided pleural effusion. Dependent atelectasis seen on the right. No pneumothorax. No suspicious pulmonary nodule or mass is noted. Upper Abdomen: Renal cysts are identified. Increased enhancement in the hepatic dome as seen on coronal image 89, probably a hemangioma or perfusional variant. Another probable hemangioma is seen in the hepatic dome on image 100 as well. No other acute abnormalities in the upper abdomen. Musculoskeletal: No chest wall abnormality. No acute or significant osseous findings. IMPRESSION: 1. There is a linear filling defect in the periphery of a left lower lobe pulmonary artery. This is consistent with an age indeterminate pulmonary embolus. While indeterminate, this may not be acute. No other emboli identified. Recommend clinical correlation. 2. Significant atelectasis in the left upper lobe resulting in volume loss. No underlying cause identified. No central obstructing mass is noted. The underlying lung is not well assessed. Recommend short-term follow-up to ensure resolution. 3. Opacification of right lower lobe bronchi may represent mucus or aspirated material. No infiltrate. 4. Moderate left-sided pleural effusion. 5. No other acute abnormalities. These results will be called to the ordering clinician or representative by the Radiologist Assistant, and communication documented in the PACS or zVision Dashboard. Electronically Signed   By: Dorise Bullion III M.D   On: 06/25/2016 22:00     Assessment/Plan   ICD-10-CM   1. Essential hypertension I10    uncontrolled  2. PE (pulmonary thromboembolism) (HCC) I26.99   3. Pleural effusion on left J90   4. Controlled type 2 diabetes mellitus with stage 3 chronic kidney disease, without long-term current use of insulin (HCC) E11.22    N18.3   5. Vascular dementia without behavioral disturbance F01.50   6. History of left breast cancer Z85.3   7. Hypothyroidism due to acquired atrophy of thyroid E03.4   8. Hemiparesis affecting left side as late effect of cerebrovascular accident ([place]) I69.354   9. CKD (chronic kidney disease), stage III N18.3   10. Anemia  in stage 3 chronic kidney disease N18.3    D63.1   11. Decubitus ulcer of left heel, unstageable ([place]) L89.620     Start lisinopril 41m daily  Check CBC , Mg and CMP in 1 week  Repeat CXR in 2 weeks for pleural effusion  Cont other meds as ordered  Will need xeralto at least 6 mos as PE is unprovoked and located distally.  F/u with specialists as indicated  PT/OT/ST as ordered  Cont nutritional supplements as ordered  Wound care as ordered  GOAL: short term rehab then long term care. Communicated with pt and nursing.  Will follow   S. CPerlie Gold PVa [facility] [address]GSpaulding [place] [address]([phone] (Monday-Friday 8 AM - 5 PM) ([phone] 5 PM and follow prompts

## 2016-07-01 LAB — CULTURE, BLOOD (ROUTINE X 2)
CULTURE: NO GROWTH
Culture: NO GROWTH
SPECIAL REQUESTS: ADEQUATE
Special Requests: ADEQUATE

## 2016-07-02 ENCOUNTER — Telehealth: Payer: Self-pay

## 2016-07-02 NOTE — Telephone Encounter (Signed)
Possible re-admission to facility. This is a patient you were seeing at [facility] . [facility] F/U is needed if patient was re-admitted to facility upon discharge. Hospital discharge from [facility] on 06/29/16

## 2016-07-09 DIAGNOSIS — N183 Chronic kidney disease, stage 3 (moderate): Secondary | ICD-10-CM | POA: Diagnosis not present

## 2016-07-09 DIAGNOSIS — F0390 Unspecified dementia without behavioral disturbance: Secondary | ICD-10-CM | POA: Diagnosis not present

## 2016-07-09 DIAGNOSIS — D649 Anemia, unspecified: Secondary | ICD-10-CM | POA: Diagnosis not present

## 2016-07-16 DIAGNOSIS — F0281 Dementia in other diseases classified elsewhere with behavioral disturbance: Secondary | ICD-10-CM | POA: Diagnosis not present

## 2016-07-16 DIAGNOSIS — E039 Hypothyroidism, unspecified: Secondary | ICD-10-CM | POA: Diagnosis not present

## 2016-07-16 DIAGNOSIS — I69859 Hemiplegia and hemiparesis following other cerebrovascular disease affecting unspecified side: Secondary | ICD-10-CM | POA: Diagnosis not present

## 2016-07-16 DIAGNOSIS — E1122 Type 2 diabetes mellitus with diabetic chronic kidney disease: Secondary | ICD-10-CM | POA: Diagnosis not present

## 2016-07-31 ENCOUNTER — Other Ambulatory Visit: Payer: Self-pay | Admitting: *Deleted

## 2016-07-31 MED ORDER — HYDROCODONE-ACETAMINOPHEN 10-325 MG PO TABS: ORAL_TABLET | ORAL | 0 refills | Status: AC

## 2016-07-31 NOTE — Telephone Encounter (Signed)
[facility] #[phone] Fax:[phone]  

## 2016-08-06 DIAGNOSIS — I10 Essential (primary) hypertension: Secondary | ICD-10-CM | POA: Diagnosis not present

## 2016-08-06 DIAGNOSIS — F0281 Dementia in other diseases classified elsewhere with behavioral disturbance: Secondary | ICD-10-CM | POA: Diagnosis not present

## 2016-08-06 DIAGNOSIS — F329 Major depressive disorder, single episode, unspecified: Secondary | ICD-10-CM | POA: Diagnosis not present

## 2016-08-18 DIAGNOSIS — F0281 Dementia in other diseases classified elsewhere with behavioral disturbance: Secondary | ICD-10-CM | POA: Diagnosis not present

## 2016-08-18 DIAGNOSIS — E039 Hypothyroidism, unspecified: Secondary | ICD-10-CM | POA: Diagnosis not present

## 2016-08-18 DIAGNOSIS — F0391 Unspecified dementia with behavioral disturbance: Secondary | ICD-10-CM | POA: Diagnosis not present

## 2016-08-18 DIAGNOSIS — E1122 Type 2 diabetes mellitus with diabetic chronic kidney disease: Secondary | ICD-10-CM | POA: Diagnosis not present

## 2016-08-18 DIAGNOSIS — F419 Anxiety disorder, unspecified: Secondary | ICD-10-CM | POA: Diagnosis not present

## 2016-08-18 DIAGNOSIS — N183 Chronic kidney disease, stage 3 (moderate): Secondary | ICD-10-CM | POA: Diagnosis not present

## 2016-09-04 DIAGNOSIS — H1132 Conjunctival hemorrhage, left eye: Secondary | ICD-10-CM | POA: Diagnosis not present

## 2016-09-07 DIAGNOSIS — N183 Chronic kidney disease, stage 3 (moderate): Secondary | ICD-10-CM | POA: Diagnosis not present

## 2016-09-07 DIAGNOSIS — H1132 Conjunctival hemorrhage, left eye: Secondary | ICD-10-CM | POA: Diagnosis not present

## 2016-09-07 DIAGNOSIS — E1122 Type 2 diabetes mellitus with diabetic chronic kidney disease: Secondary | ICD-10-CM | POA: Diagnosis not present

## 2016-09-07 DIAGNOSIS — F0281 Dementia in other diseases classified elsewhere with behavioral disturbance: Secondary | ICD-10-CM | POA: Diagnosis not present

## 2016-09-17 DIAGNOSIS — F0281 Dementia in other diseases classified elsewhere with behavioral disturbance: Secondary | ICD-10-CM | POA: Diagnosis not present

## 2016-09-17 DIAGNOSIS — E039 Hypothyroidism, unspecified: Secondary | ICD-10-CM | POA: Diagnosis not present

## 2016-09-17 DIAGNOSIS — E1122 Type 2 diabetes mellitus with diabetic chronic kidney disease: Secondary | ICD-10-CM | POA: Diagnosis not present

## 2016-09-17 DIAGNOSIS — M6281 Muscle weakness (generalized): Secondary | ICD-10-CM | POA: Diagnosis not present

## 2016-09-22 DIAGNOSIS — L97522 Non-pressure chronic ulcer of other part of left foot with fat layer exposed: Secondary | ICD-10-CM | POA: Diagnosis not present

## 2016-09-22 DIAGNOSIS — L89610 Pressure ulcer of right heel, unstageable: Secondary | ICD-10-CM | POA: Diagnosis not present

## 2016-09-29 DIAGNOSIS — L89610 Pressure ulcer of right heel, unstageable: Secondary | ICD-10-CM | POA: Diagnosis not present

## 2016-10-15 DIAGNOSIS — I639 Cerebral infarction, unspecified: Secondary | ICD-10-CM | POA: Diagnosis not present

## 2016-10-15 DIAGNOSIS — M1990 Unspecified osteoarthritis, unspecified site: Secondary | ICD-10-CM | POA: Diagnosis not present

## 2016-10-15 DIAGNOSIS — F0390 Unspecified dementia without behavioral disturbance: Secondary | ICD-10-CM | POA: Diagnosis not present

## 2016-10-15 DIAGNOSIS — N183 Chronic kidney disease, stage 3 (moderate): Secondary | ICD-10-CM | POA: Diagnosis not present

## 2016-11-04 DIAGNOSIS — F419 Anxiety disorder, unspecified: Secondary | ICD-10-CM | POA: Diagnosis not present

## 2016-11-04 DIAGNOSIS — F0391 Unspecified dementia with behavioral disturbance: Secondary | ICD-10-CM | POA: Diagnosis not present

## 2016-11-04 DIAGNOSIS — F329 Major depressive disorder, single episode, unspecified: Secondary | ICD-10-CM | POA: Diagnosis not present

## 2016-11-20 DIAGNOSIS — F419 Anxiety disorder, unspecified: Secondary | ICD-10-CM | POA: Diagnosis not present

## 2016-11-20 DIAGNOSIS — F329 Major depressive disorder, single episode, unspecified: Secondary | ICD-10-CM | POA: Diagnosis not present

## 2016-11-20 DIAGNOSIS — F0391 Unspecified dementia with behavioral disturbance: Secondary | ICD-10-CM | POA: Diagnosis not present

## 2016-11-30 ENCOUNTER — Emergency Department (HOSPITAL_COMMUNITY)
Admission: EM | Admit: 2016-11-30 | Discharge: 2016-12-19 | Disposition: E | Payer: Medicare Other | Attending: Emergency Medicine | Admitting: Emergency Medicine

## 2016-11-30 DIAGNOSIS — I469 Cardiac arrest, cause unspecified: Secondary | ICD-10-CM | POA: Diagnosis not present

## 2016-11-30 DIAGNOSIS — F0390 Unspecified dementia without behavioral disturbance: Secondary | ICD-10-CM | POA: Insufficient documentation

## 2016-11-30 DIAGNOSIS — Z7901 Long term (current) use of anticoagulants: Secondary | ICD-10-CM | POA: Insufficient documentation

## 2016-11-30 DIAGNOSIS — E034 Atrophy of thyroid (acquired): Secondary | ICD-10-CM | POA: Insufficient documentation

## 2016-11-30 DIAGNOSIS — I129 Hypertensive chronic kidney disease with stage 1 through stage 4 chronic kidney disease, or unspecified chronic kidney disease: Secondary | ICD-10-CM | POA: Insufficient documentation

## 2016-11-30 DIAGNOSIS — Z86711 Personal history of pulmonary embolism: Secondary | ICD-10-CM | POA: Insufficient documentation

## 2016-11-30 DIAGNOSIS — E1122 Type 2 diabetes mellitus with diabetic chronic kidney disease: Secondary | ICD-10-CM | POA: Insufficient documentation

## 2016-11-30 DIAGNOSIS — Z853 Personal history of malignant neoplasm of breast: Secondary | ICD-10-CM | POA: Insufficient documentation

## 2016-11-30 DIAGNOSIS — Z7902 Long term (current) use of antithrombotics/antiplatelets: Secondary | ICD-10-CM | POA: Insufficient documentation

## 2016-11-30 DIAGNOSIS — N183 Chronic kidney disease, stage 3 (moderate): Secondary | ICD-10-CM | POA: Insufficient documentation

## 2016-11-30 MED ORDER — EPINEPHRINE PF 1 MG/10ML IJ SOSY
PREFILLED_SYRINGE | INTRAMUSCULAR | Status: AC | PRN
  Administered 2016-11-30 (×2): 1 mg via INTRAVENOUS

## 2016-11-30 MED FILL — Medication: Qty: 1 | Status: AC

## 2016-12-19 NOTE — Code Documentation (Signed)
Patient moved onto ED Zoll pads and Linton Rump. Asystole on monitor.

## 2016-12-19 NOTE — ED Provider Notes (Signed)
[facility] EMERGENCY DEPARTMENT Provider Note   CSN: [id number] Arrival date & time: Dec 27, 2016  1355     History   Chief Complaint No chief complaint on file.   HPI [name] is a 83 y.o. female.  HPI  The pt is an 83 y/o female - has recently history of admission to the hospital 5 months ago with a PE - she was started on XArelto and has been at her Nursing facility in her usual state of health when she was last noticed to be seen 10 minutes prior to noting her to be in cardiac arrest.  Paramedics arrived on the scene at just after 1:00 PM and began CPR immediately.  The patient was intubated and ventilated, CPR continued with pulseless electrical activity alternating with asystole, 7 doses of epinephrine were given prehospital without return of spontaneous circulation.  Family members were contacted by phone and requested that the patient be transported to the hospital for evaluation.  The patient is unable to give any information, intubated totally unresponsive and without vital signs.  Past Medical History:  Diagnosis Date  . Anemia   . Arthritis   . Chronic kidney disease   . Diabetes mellitus without complication ([place])   . Hemorrhoid   . Hypertension   . Stroke ([place])   . Thyroid disease     Patient Active Problem List   Diagnosis Date Noted  . Moderate protein-calorie malnutrition ([place]) 06/26/2016  . PE (pulmonary thromboembolism) ([place]) 06/25/2016  . Dysphagia, pharyngoesophageal phase 12/28/2015  . Hypothyroidism due to acquired atrophy of thyroid 08/07/2015  . Diabetes mellitus with renal manifestations, controlled ([place]) 12/13/2014  . Hemiparesis affecting left side as late effect of cerebrovascular accident ([place]) 12/13/2014  . Loss of weight 12/13/2014  . Anemia in chronic renal disease 11/10/2014  . Dementia without behavioral disturbance 09/24/2014  . Vitamin D deficiency 09/24/2014  . Decubitus ulcer of left heel, unstageable ([place])  09/17/2014  . Hypokalemia 09/16/2014  . Cerebral thrombosis with cerebral infarction ([place]) 07/06/2014  . Constipation 03/29/2014  . CKD (chronic kidney disease), stage III ([place]) 11/08/2013  . Breast cancer, left breast ([place]) 10/07/2012  . Essential hypertension 08/29/2012  . Anemia 01/09/2011    Past Surgical History:  Procedure Laterality Date  . ABDOMINAL HYSTERECTOMY     PARTIAL  . BREAST LUMPECTOMY     LEFT  . MASTECTOMY  05/2008   Left    OB History    No data available       Home Medications    Prior to Admission medications   Medication Sig Start Date End Date Taking? Authorizing Provider  Amino Acids-Protein Hydrolys (FEEDING SUPPLEMENT, PRO-STAT SUGAR FREE 64,) LIQD Take 30 mLs by mouth 3 (three) times daily with meals.    [provider]  Ascorbic Acid (VITAMIN C) 1000 MG tablet Take 1,000 mg by mouth daily.    [provider]  Cholecalciferol (VITAMIN D) 2000 UNITS tablet Take 2,000 Units by mouth daily.    [provider]  clopidogrel (PLAVIX) 75 MG tablet Take 1 tablet (75 mg total) by mouth daily. 07/09/14   Geradine Girt, DO  donepezil (ARICEPT) 5 MG tablet Take 5 mg by mouth at bedtime.     [provider]  ferrous sulfate 325 (65 FE) MG tablet Take 325 mg by mouth 2 (two) times daily with a meal.    [provider]  HYDROcodone-acetaminophen (NORCO) 10-325 MG tablet Take one tablet by mouth twice daily  for pain. NTE 3000mg  of APAP from all sources/24hr 07/31/16   Lauree Chandler, NP  ipratropium-albuterol (DUONEB) 0.5-2.5 (3) MG/3ML SOLN Take 3 mLs by nebulization every 6 (six) hours as needed (wheezing).    [provider]  levothyroxine (SYNTHROID, LEVOTHROID) 137 MCG tablet Take 137 mcg by mouth daily before breakfast.    [provider]  LORazepam (ATIVAN) 0.5 MG tablet Take 0.5 mg by mouth daily.     [provider]  magnesium hydroxide (MILK OF MAGNESIA) 400 MG/5ML suspension Take  45 mLs by mouth daily as needed for mild constipation.    [provider]  metoprolol tartrate (LOPRESSOR) 25 MG tablet Take 12.5 mg by mouth 2 (two) times daily.    [provider]  Multiple Vitamins-Minerals (ELDERTONIC PO) Give 15 cc by mouth two times daily for weight loss    [provider]  Nutritional Supplements (NUTRITIONAL SUPPLEMENT PO) Take 120 mLs by mouth 4 (four) times daily. Puree texture, regular consistency    [provider]  PARoxetine (PAXIL) 40 MG tablet Take 40 mg by mouth daily.    [provider]  Rivaroxaban 15 & 20 MG TBPK Take as directed on package: Start with one 15mg  tablet by mouth twice a day with food. On Day 22, switch to one 20mg  tablet once a day with food. 06/29/16   Velvet Bathe, MD  rOPINIRole (REQUIP) 3 MG tablet Take 3 mg by mouth at bedtime.  10/20/10   [provider]  saccharomyces boulardii (FLORASTOR) 250 MG capsule Take 250 mg by mouth 2 (two) times daily.    [provider]  UNABLE TO Novi Cup every day with lunch and dinner    [provider]  vitamin B-12 1000 MCG tablet Take 1 tablet (1,000 mcg total) by mouth daily. 07/09/14   Geradine Girt, DO    Family History No family history on file.  Social History Social History   Tobacco Use  . Smoking status: Never Smoker  . Smokeless tobacco: Never Used  Substance Use Topics  . Alcohol use: No    Alcohol/week: 0.0 oz  . Drug use: No     Allergies   Aspirin and Penicillins   Review of Systems Review of Systems  Unable to perform ROS: Acuity of condition     Physical Exam Updated Vital Signs Wt 59 kg (130 lb)   BMI 20.36 kg/m   Physical Exam  Constitutional:  Cachectic, ill-appearing, totally unresponsive  HENT:  Endotracheal tube present in the oropharynx and into the trachea, no foreign body seen otherwise, mucous membranes appear normal  Eyes:  Pupil on the right 2  millimeters, pupil on the right 3 mm, conjunctivae clear  Neck:  JVD present with CPR  Cardiovascular:  No spontaneous heartbeat, no auscultated heart sounds, pulses present with CPR  Pulmonary/Chest:  No spontaneous breath sounds, diffuse rhonchi with mechanical ventilation  Abdominal:  Soft, nondistended, no masses  Musculoskeletal:  Diffuse muscle wasting, cachectic, intraosseous line in the proximal left tibia  Neurological:  GCS 3, unresponsive  Skin:  No significant rash or skin breakdown on the trunk, protective boots on bilateral feet     ED Treatments / Results  Labs (all labs ordered are listed, but only abnormal results are displayed) Labs Reviewed - No data to display  Radiology No results found.  Procedures Procedures (including critical care time)  Cardiopulmonary Resuscitation (CPR) Procedure Note Directed/Performed by: Johnna Acosta I personally  directed ancillary staff and/or performed CPR in an effort to regain return of spontaneous circulation and to maintain cardiac, neuro and systemic perfusion.    Medications Ordered in ED Medications  EPINEPHrine (ADRENALIN) 1 MG/10ML injection (1 mg Intravenous Given Dec 13, 2016 1358)     Initial Impression / Assessment and Plan / ED Course  I have reviewed the triage vital signs and the nursing notes.  Pertinent labs & imaging results that were available during my care of the patient were reviewed by me and considered in my medical decision making (see chart for details).     CPR was initiated on the patient's arrival, taken over from the paramedics.  I directed CPR, gave 2 more doses of epinephrine, checked an ultrasound of the heart at which time it showed that there is absolutely no cardiac activity.  It was decided that after over 1 hour of resuscitative efforts that the patient should be declared dead, that was at 2:02 PM.  There was no return of spontaneous circulation.  Dr. is family members will be  contacted.  D/w Family they have been updated and understand her passing.  Final Clinical Impressions(s) / ED Diagnoses   Final diagnoses:  Cardiac arrest [facility])  Asystole [facility])    ED Discharge Orders    None       Noemi Chapel, MD 2016-12-13 1521

## 2016-12-19 NOTE — Progress Notes (Addendum)
Chaplain accompanied MD and other members of the medical team to share news with family regarding this patient being deceased.  Family coping well and seems content with the patient's suffering ending.  Chaplain gathered necessary information from next of kin and provided it for the nurse as well as funeral home information.  Family says they are fine and will be leaving the consult room shortly.  Chaplain happy to provide care to this family and staff.   12/04/16 1503  Clinical Encounter Type  Visited With Family;Health care provider  Visit Type Initial;Spiritual support;Death

## 2016-12-19 NOTE — Progress Notes (Signed)
RT called to trauma B for pt arriving with CPR in progress.  RT attached ambu bag to pt's existing airway that EMS placed. Pt had bilateral breath sounds & chest rise.  RT continued to bag ventilate the pt until time of death was pronounced.

## 2016-12-19 NOTE — ED Triage Notes (Signed)
Patient in via [place] from [employer] on-going CPR - patient found down unresponsive by staff, called [employer] initiated CPR at 1308, first epi given at 1316, total 7 epi's given. Patient PEA. Given 1 L NS PTA via IO L tib-fib. EMS gave 2mg  Narcan PTA as well without response. Pt recently diagnosed with pneumonia, family requested transport. Pupils unequal and nonreactive on arrival.

## 2016-12-19 NOTE — ED Notes (Signed)
Family and Chaplain with patient at this time.

## 2016-12-19 DEATH — deceased

## 2016-12-24 ENCOUNTER — Other Ambulatory Visit: Payer: Self-pay | Admitting: Nurse Practitioner

## 2018-07-02 IMAGING — CT CT CHEST W/ CM
2 of 4 series · 15 of 36 positions shown, 18 images · IV contrast (APPLIED)
Comparison: Chest x-ray from earlier today and CT scan February 26, 2015

CLINICAL DATA: The patient has a cough. CT recommended to exclude a
central obstructing mass on the left based on chest x-ray.

EXAM:
CT CHEST WITH CONTRAST
TECHNIQUE: Multidetector CT imaging of the chest was performed during
intravenous contrast administration.
CONTRAST:  75 mL of Isovue 370

[Series 3: thorax 2.0 i31f 2 · axial · 0.58mm/px · z∈[+1183,+1431]mm · 12 of 146 slices shown, 15 images]
[im 11/146  mediastinal]
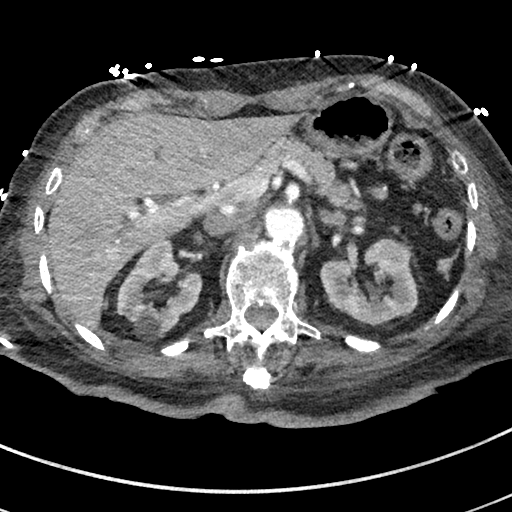
[im 11/146  lung]
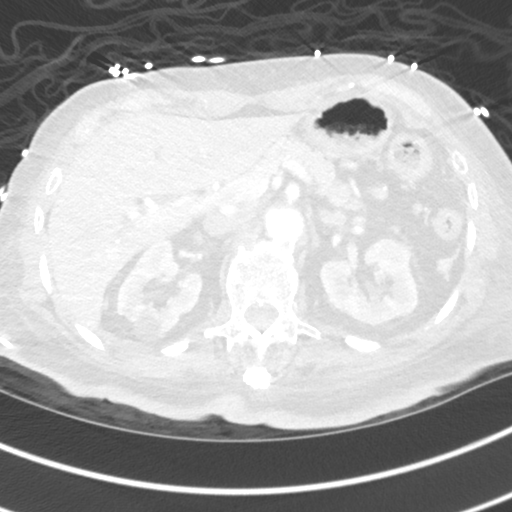
[im 21/146  lung]
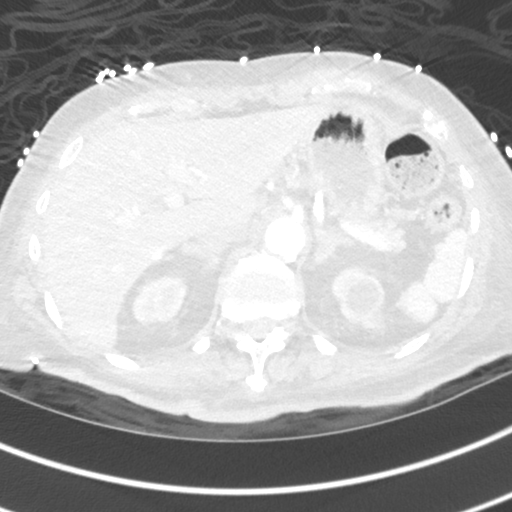
[im 32/146  lung]
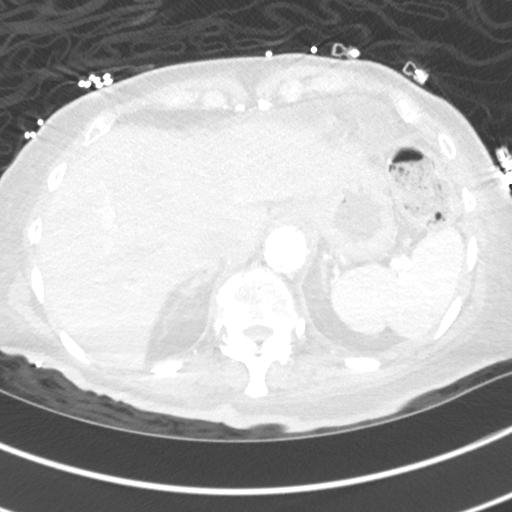
[im 42/146  lung]
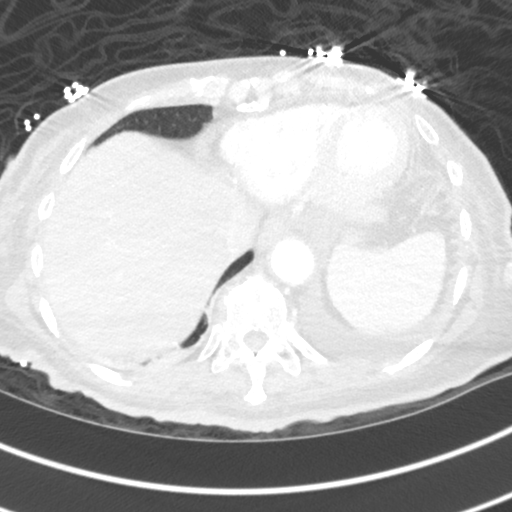
[im 52/146  mediastinal]
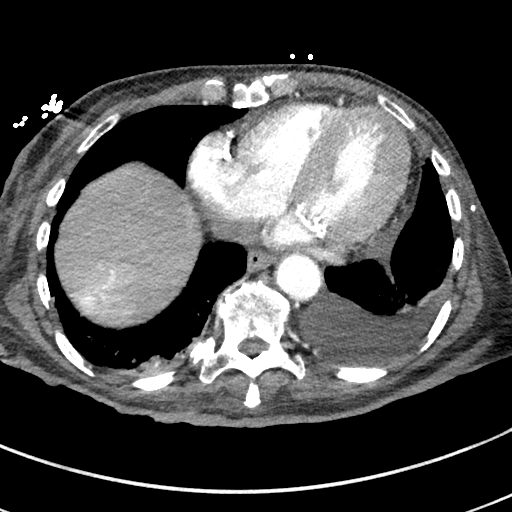
[im 52/146  lung]
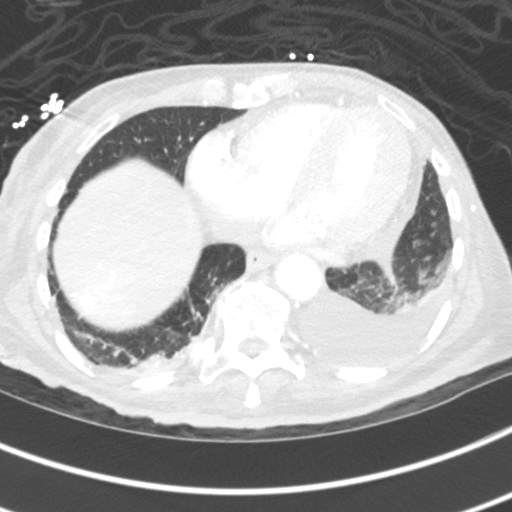
[im 63/146  lung]
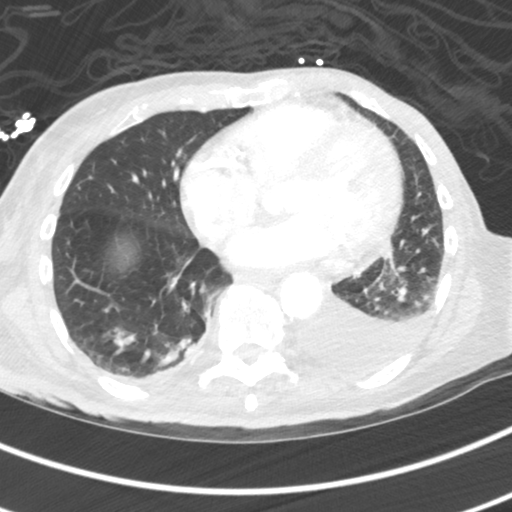
[im 83/146  lung]
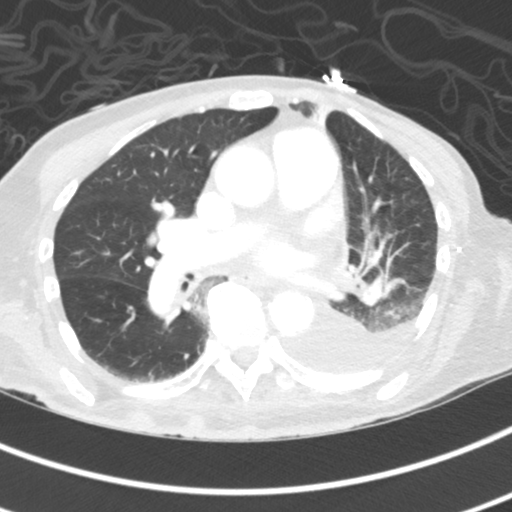
[im 94/146  lung]
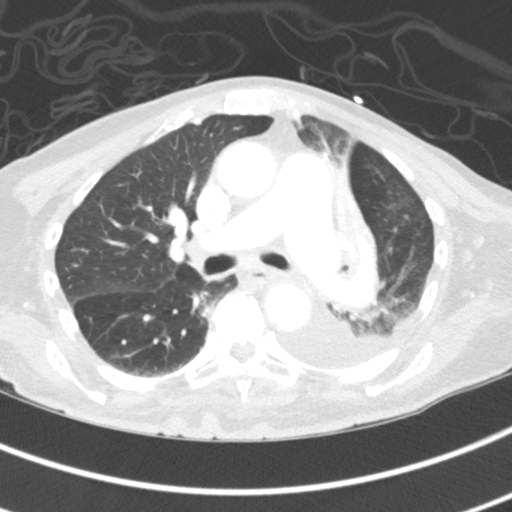
[im 104/146  mediastinal]
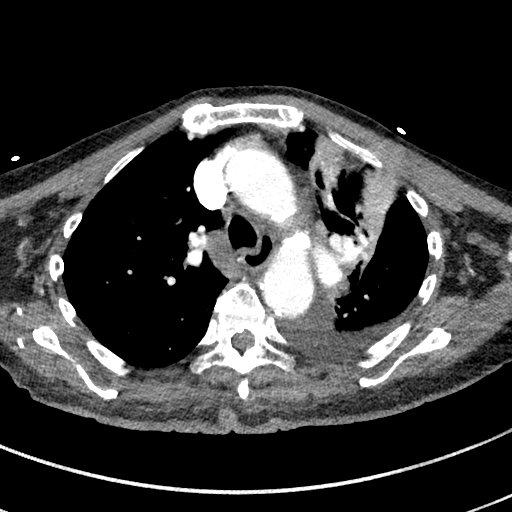
[im 104/146  lung]
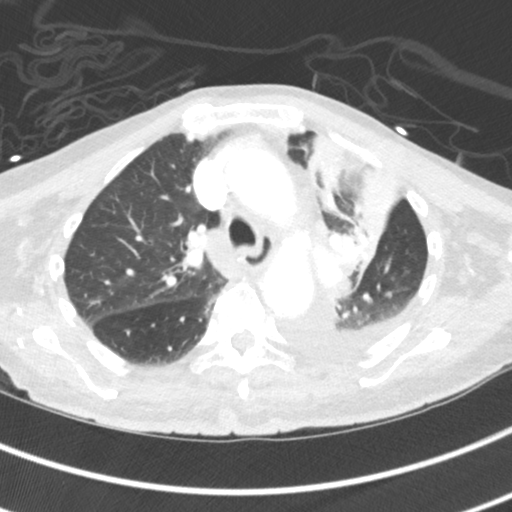
[im 114/146  lung]
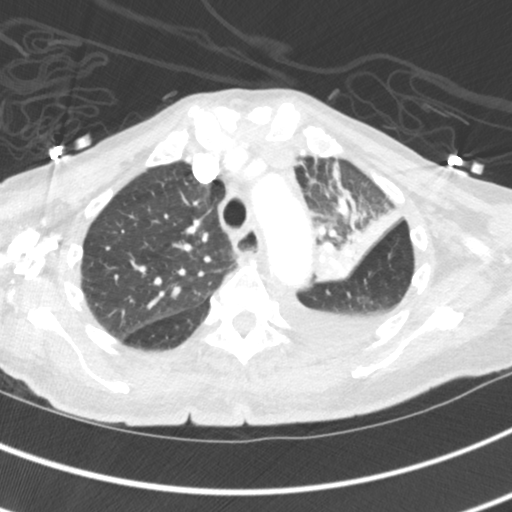
[im 125/146  lung]
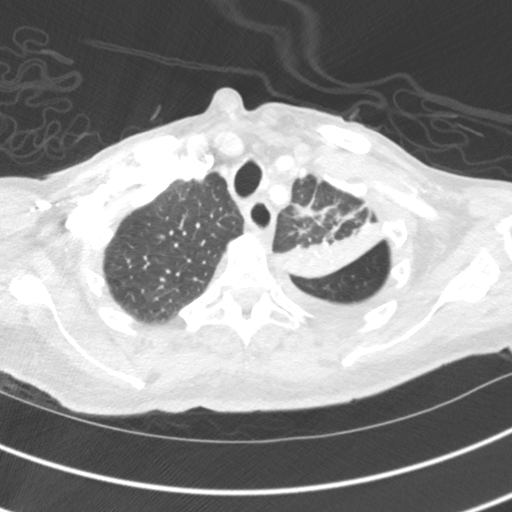
[im 135/146  lung]
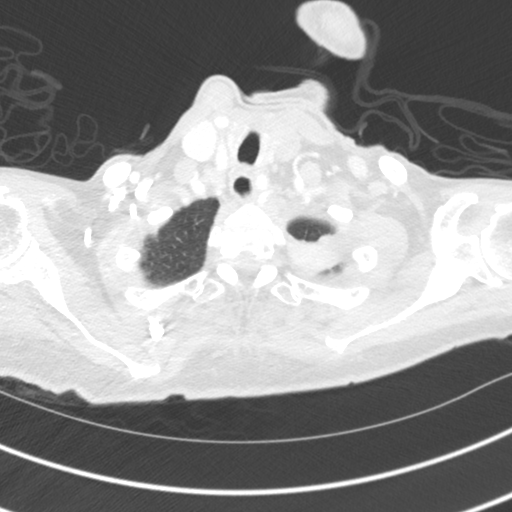

[Series 5: coronal · coronal · 0.59mm/px · 3 of 107 slices shown]
[im 22/107  lung]
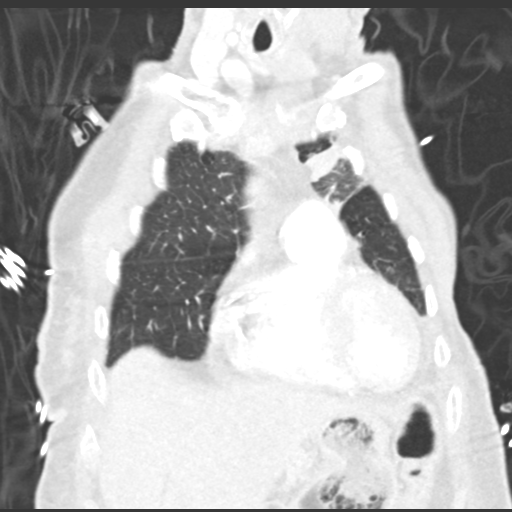
[im 43/107  lung]
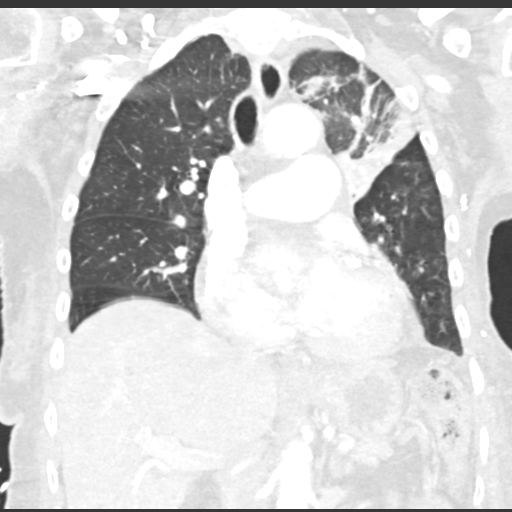
[im 64/107  lung]
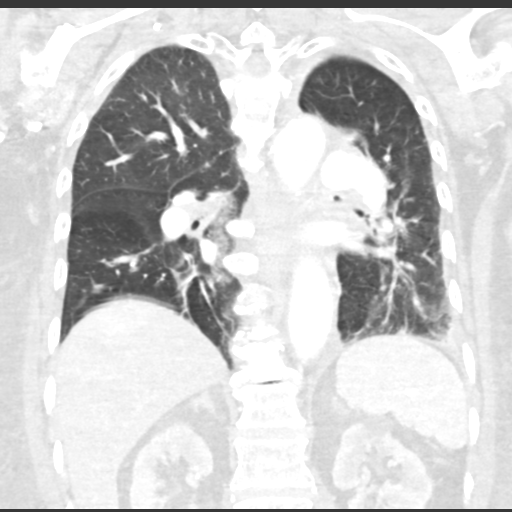

[15 of 36 positions shown; findings below may reference images not displayed]

FINDINGS: Cardiovascular: The heart is unchanged. Coronary artery
calcifications are noted. The thoracic aorta is non aneurysmal. No
dissection. The study was not tailored to evaluate for pulmonary
emboli. No emboli are seen in the main pulmonary arteries. A linear
filling defect is seen in the periphery of a left lower lobe
pulmonary artery as seen on axial image 66 and coronal image 68. No
emboli seen on the right. The volume loss on the left results in
mild shift of the mediastinum to the left.

Mediastinum/Nodes: The esophagus is normal. No adenopathy
identified. Moderate left pleural effusion is seen. No pericardial
effusion or right pleural effusion.

Lungs/Pleura: The trachea and mainstem bronchi are normal.
Opacification of right lower lobe airways. There is significant
atelectasis in the left upper lobe. No underlying cause is
identified. No centrally obstructing masses noted. There is more
mild atelectasis in the left base. There is a moderate left-sided
pleural effusion. Dependent atelectasis seen on the right. No
pneumothorax. No suspicious pulmonary nodule or mass is noted.

Upper Abdomen: Renal cysts are identified. Increased enhancement in
the hepatic dome as seen on coronal image 89, probably a hemangioma
or perfusional variant. Another probable hemangioma is seen in the
hepatic dome on image 100 as well. No other acute abnormalities in
the upper abdomen.

Musculoskeletal: No chest wall abnormality. No acute or significant
osseous findings.
IMPRESSION: 1. There is a linear filling defect in the periphery of a left lower
lobe pulmonary artery. This is consistent with an age indeterminate
pulmonary embolus. While indeterminate, this may not be acute. No
other emboli identified. Recommend clinical correlation.
2. Significant atelectasis in the left upper lobe resulting in
volume loss. No underlying cause identified. No central obstructing
mass is noted. The underlying lung is not well assessed. Recommend
short-term follow-up to ensure resolution.
3. Opacification of right lower lobe bronchi may represent mucus or
aspirated material. No infiltrate.
4. Moderate left-sided pleural effusion.
5. No other acute abnormalities.
These results will be called to the ordering clinician or
representative by the Radiologist Assistant, and communication
documented in the PACS or zVision Dashboard.
